# Patient Record
Sex: Female | Born: 1935 | Race: White | Hispanic: No | Smoking: Never smoker
Health system: Southern US, Community
[De-identification: ages and names within clinical notes are randomized; demographics above are authoritative.]

## PROBLEM LIST (undated history)

## (undated) DIAGNOSIS — N3281 Overactive bladder: Secondary | ICD-10-CM

## (undated) DIAGNOSIS — I639 Cerebral infarction, unspecified: Secondary | ICD-10-CM

## (undated) DIAGNOSIS — G20C Parkinsonism, unspecified: Secondary | ICD-10-CM

## (undated) DIAGNOSIS — M1990 Unspecified osteoarthritis, unspecified site: Secondary | ICD-10-CM

## (undated) DIAGNOSIS — N6019 Diffuse cystic mastopathy of unspecified breast: Secondary | ICD-10-CM

## (undated) DIAGNOSIS — I319 Disease of pericardium, unspecified: Secondary | ICD-10-CM

## (undated) DIAGNOSIS — J302 Other seasonal allergic rhinitis: Secondary | ICD-10-CM

## (undated) DIAGNOSIS — G20 Parkinson's disease: Secondary | ICD-10-CM

## (undated) DIAGNOSIS — I712 Thoracic aortic aneurysm, without rupture, unspecified: Secondary | ICD-10-CM

## (undated) DIAGNOSIS — I6789 Other cerebrovascular disease: Secondary | ICD-10-CM

## (undated) DIAGNOSIS — I4891 Unspecified atrial fibrillation: Secondary | ICD-10-CM

## (undated) DIAGNOSIS — IMO0002 Reserved for concepts with insufficient information to code with codable children: Secondary | ICD-10-CM

## (undated) DIAGNOSIS — R569 Unspecified convulsions: Secondary | ICD-10-CM

## (undated) DIAGNOSIS — C801 Malignant (primary) neoplasm, unspecified: Secondary | ICD-10-CM

## (undated) DIAGNOSIS — I6350 Cerebral infarction due to unspecified occlusion or stenosis of unspecified cerebral artery: Secondary | ICD-10-CM

## (undated) DIAGNOSIS — E785 Hyperlipidemia, unspecified: Secondary | ICD-10-CM

## (undated) DIAGNOSIS — I951 Orthostatic hypotension: Secondary | ICD-10-CM

## (undated) DIAGNOSIS — I442 Atrioventricular block, complete: Secondary | ICD-10-CM

## (undated) DIAGNOSIS — D49519 Neoplasm of unspecified behavior of unspecified kidney: Secondary | ICD-10-CM

## (undated) DIAGNOSIS — I10 Essential (primary) hypertension: Secondary | ICD-10-CM

## (undated) DIAGNOSIS — K219 Gastro-esophageal reflux disease without esophagitis: Secondary | ICD-10-CM

## (undated) HISTORY — PX: KNEE ARTHROSCOPY: SHX127

## (undated) HISTORY — DX: Other seasonal allergic rhinitis: J30.2

## (undated) HISTORY — DX: Hyperlipidemia, unspecified: E78.5

## (undated) HISTORY — DX: Atrioventricular block, complete: I44.2

## (undated) HISTORY — DX: Thoracic aortic aneurysm, without rupture: I71.2

## (undated) HISTORY — DX: Unspecified atrial fibrillation: I48.91

## (undated) HISTORY — DX: Diffuse cystic mastopathy of unspecified breast: N60.19

## (undated) HISTORY — DX: Neoplasm of unspecified behavior of unspecified kidney: D49.519

## (undated) HISTORY — PX: EYE SURGERY: SHX253

## (undated) HISTORY — DX: Gastro-esophageal reflux disease without esophagitis: K21.9

## (undated) HISTORY — PX: CHOLECYSTECTOMY: SHX55

## (undated) HISTORY — PX: APPENDECTOMY: SHX54

## (undated) HISTORY — PX: VESICOVAGINAL FISTULA CLOSURE W/ TAH: SUR271

## (undated) HISTORY — PX: PACEMAKER INSERTION: SHX728

## (undated) HISTORY — PX: ABLATION: SHX5711

## (undated) HISTORY — DX: Essential (primary) hypertension: I10

## (undated) HISTORY — DX: Thoracic aortic aneurysm, without rupture, unspecified: I71.20

## (undated) HISTORY — PX: CATARACT EXTRACTION: SUR2

## (undated) HISTORY — PX: FLEXIBLE BRONCHOSCOPY W/ UPPER ENDOSCOPY: SHX1648

## (undated) HISTORY — DX: Overactive bladder: N32.81

---

## 1973-02-01 HISTORY — PX: ABDOMINAL HYSTERECTOMY: SHX81

## 2005-06-22 ENCOUNTER — Ambulatory Visit (HOSPITAL_COMMUNITY): Admission: RE | Admit: 2005-06-22 | Discharge: 2005-06-22 | Payer: Self-pay | Admitting: Gastroenterology

## 2005-07-20 ENCOUNTER — Ambulatory Visit (HOSPITAL_COMMUNITY): Admission: RE | Admit: 2005-07-20 | Discharge: 2005-07-20 | Payer: Self-pay | Admitting: Gastroenterology

## 2006-01-28 ENCOUNTER — Ambulatory Visit (HOSPITAL_COMMUNITY): Admission: RE | Admit: 2006-01-28 | Discharge: 2006-01-28 | Payer: Self-pay | Admitting: Interventional Cardiology

## 2007-01-02 HISTORY — PX: BI-VENTRICULAR PACEMAKER UPGRADE: SHX5752

## 2007-01-24 ENCOUNTER — Inpatient Hospital Stay (HOSPITAL_COMMUNITY): Admission: RE | Admit: 2007-01-24 | Discharge: 2007-01-25 | Payer: Self-pay | Admitting: Cardiology

## 2007-05-18 ENCOUNTER — Ambulatory Visit: Payer: Self-pay | Admitting: Internal Medicine

## 2007-05-18 DIAGNOSIS — J328 Other chronic sinusitis: Secondary | ICD-10-CM | POA: Insufficient documentation

## 2007-05-18 DIAGNOSIS — R059 Cough, unspecified: Secondary | ICD-10-CM | POA: Insufficient documentation

## 2007-05-18 DIAGNOSIS — R05 Cough: Secondary | ICD-10-CM

## 2007-05-29 ENCOUNTER — Telehealth (INDEPENDENT_AMBULATORY_CARE_PROVIDER_SITE_OTHER): Payer: Self-pay | Admitting: *Deleted

## 2007-06-20 ENCOUNTER — Ambulatory Visit: Payer: Self-pay | Admitting: Internal Medicine

## 2007-06-20 LAB — CONVERTED CEMR LAB
Basophils Absolute: 0 10*3/uL (ref 0.0–0.1)
Basophils Relative: 0.7 % (ref 0.0–1.0)
Eosinophils Absolute: 0.4 10*3/uL (ref 0.0–0.7)
Eosinophils Relative: 6.6 % — ABNORMAL HIGH (ref 0.0–5.0)
HCT: 38.9 % (ref 36.0–46.0)
Hemoglobin: 13.3 g/dL (ref 12.0–15.0)
IgE (Immunoglobulin E), Serum: 5.7 intl units/mL (ref 0.0–180.0)
Lymphocytes Relative: 25.8 % (ref 12.0–46.0)
MCHC: 34.3 g/dL (ref 30.0–36.0)
MCV: 88.7 fL (ref 78.0–100.0)
Monocytes Absolute: 0.6 10*3/uL (ref 0.1–1.0)
Monocytes Relative: 10.1 % (ref 3.0–12.0)
Neutro Abs: 3.7 10*3/uL (ref 1.4–7.7)
Neutrophils Relative %: 56.8 % (ref 43.0–77.0)
Platelets: 157 10*3/uL (ref 150–400)
RBC: 4.39 M/uL (ref 3.87–5.11)
RDW: 12.9 % (ref 11.5–14.6)
WBC: 6.4 10*3/uL (ref 4.5–10.5)

## 2007-07-02 DIAGNOSIS — K219 Gastro-esophageal reflux disease without esophagitis: Secondary | ICD-10-CM | POA: Insufficient documentation

## 2007-07-05 ENCOUNTER — Ambulatory Visit: Payer: Self-pay | Admitting: Internal Medicine

## 2007-11-07 ENCOUNTER — Ambulatory Visit: Payer: Self-pay | Admitting: Internal Medicine

## 2008-01-08 ENCOUNTER — Ambulatory Visit: Payer: Self-pay | Admitting: Internal Medicine

## 2008-01-17 ENCOUNTER — Ambulatory Visit: Payer: Self-pay | Admitting: Internal Medicine

## 2008-01-18 ENCOUNTER — Ambulatory Visit: Payer: Self-pay | Admitting: Internal Medicine

## 2008-01-22 ENCOUNTER — Ambulatory Visit: Payer: Self-pay | Admitting: Internal Medicine

## 2008-01-25 ENCOUNTER — Ambulatory Visit: Payer: Self-pay | Admitting: Internal Medicine

## 2008-01-29 ENCOUNTER — Ambulatory Visit: Payer: Self-pay | Admitting: Internal Medicine

## 2008-02-01 ENCOUNTER — Ambulatory Visit: Payer: Self-pay | Admitting: Internal Medicine

## 2008-02-01 ENCOUNTER — Inpatient Hospital Stay (HOSPITAL_COMMUNITY): Admission: EM | Admit: 2008-02-01 | Discharge: 2008-02-07 | Payer: Self-pay | Admitting: Emergency Medicine

## 2008-02-02 ENCOUNTER — Encounter (INDEPENDENT_AMBULATORY_CARE_PROVIDER_SITE_OTHER): Payer: Self-pay | Admitting: Neurology

## 2008-02-05 ENCOUNTER — Encounter (INDEPENDENT_AMBULATORY_CARE_PROVIDER_SITE_OTHER): Payer: Self-pay | Admitting: Neurology

## 2008-02-06 ENCOUNTER — Encounter (INDEPENDENT_AMBULATORY_CARE_PROVIDER_SITE_OTHER): Payer: Self-pay | Admitting: Neurology

## 2008-02-07 ENCOUNTER — Encounter (INDEPENDENT_AMBULATORY_CARE_PROVIDER_SITE_OTHER): Payer: Self-pay | Admitting: Neurology

## 2008-02-07 ENCOUNTER — Ambulatory Visit: Payer: Self-pay | Admitting: Vascular Surgery

## 2008-02-12 ENCOUNTER — Ambulatory Visit: Payer: Self-pay | Admitting: Internal Medicine

## 2008-02-15 ENCOUNTER — Ambulatory Visit: Payer: Self-pay | Admitting: Internal Medicine

## 2008-02-19 ENCOUNTER — Ambulatory Visit: Payer: Self-pay | Admitting: Internal Medicine

## 2008-02-23 ENCOUNTER — Ambulatory Visit: Payer: Self-pay | Admitting: Internal Medicine

## 2008-02-27 ENCOUNTER — Ambulatory Visit: Payer: Self-pay | Admitting: Internal Medicine

## 2008-03-01 ENCOUNTER — Ambulatory Visit: Payer: Self-pay | Admitting: Internal Medicine

## 2008-03-04 ENCOUNTER — Ambulatory Visit: Payer: Self-pay | Admitting: Internal Medicine

## 2008-03-07 ENCOUNTER — Ambulatory Visit: Payer: Self-pay | Admitting: Internal Medicine

## 2008-03-11 ENCOUNTER — Ambulatory Visit: Payer: Self-pay | Admitting: Internal Medicine

## 2008-03-12 ENCOUNTER — Ambulatory Visit: Payer: Self-pay | Admitting: Internal Medicine

## 2008-03-14 ENCOUNTER — Ambulatory Visit: Payer: Self-pay | Admitting: Internal Medicine

## 2008-03-18 ENCOUNTER — Ambulatory Visit: Payer: Self-pay | Admitting: Internal Medicine

## 2008-03-21 ENCOUNTER — Ambulatory Visit: Payer: Self-pay | Admitting: Internal Medicine

## 2008-03-25 ENCOUNTER — Ambulatory Visit: Payer: Self-pay | Admitting: Internal Medicine

## 2008-03-28 ENCOUNTER — Ambulatory Visit: Payer: Self-pay | Admitting: Internal Medicine

## 2008-04-01 ENCOUNTER — Ambulatory Visit: Payer: Self-pay | Admitting: Internal Medicine

## 2008-04-05 ENCOUNTER — Ambulatory Visit: Payer: Self-pay | Admitting: Internal Medicine

## 2008-04-08 ENCOUNTER — Ambulatory Visit: Payer: Self-pay | Admitting: Internal Medicine

## 2008-04-10 ENCOUNTER — Ambulatory Visit: Payer: Self-pay | Admitting: Internal Medicine

## 2008-04-11 ENCOUNTER — Ambulatory Visit: Payer: Self-pay | Admitting: Internal Medicine

## 2008-04-15 ENCOUNTER — Ambulatory Visit: Payer: Self-pay | Admitting: Internal Medicine

## 2008-04-18 ENCOUNTER — Ambulatory Visit: Payer: Self-pay | Admitting: Internal Medicine

## 2008-04-22 ENCOUNTER — Ambulatory Visit: Payer: Self-pay | Admitting: Internal Medicine

## 2008-04-24 ENCOUNTER — Encounter: Admission: RE | Admit: 2008-04-24 | Discharge: 2008-06-19 | Payer: Self-pay | Admitting: Neurology

## 2008-04-26 ENCOUNTER — Ambulatory Visit: Payer: Self-pay | Admitting: Internal Medicine

## 2008-04-29 ENCOUNTER — Ambulatory Visit: Payer: Self-pay | Admitting: Internal Medicine

## 2008-05-07 ENCOUNTER — Ambulatory Visit: Payer: Self-pay | Admitting: Internal Medicine

## 2008-05-07 DIAGNOSIS — I6350 Cerebral infarction due to unspecified occlusion or stenosis of unspecified cerebral artery: Secondary | ICD-10-CM

## 2008-05-07 HISTORY — DX: Cerebral infarction due to unspecified occlusion or stenosis of unspecified cerebral artery: I63.50

## 2008-05-10 ENCOUNTER — Telehealth (INDEPENDENT_AMBULATORY_CARE_PROVIDER_SITE_OTHER): Payer: Self-pay | Admitting: *Deleted

## 2008-05-13 ENCOUNTER — Encounter: Payer: Self-pay | Admitting: Internal Medicine

## 2008-05-13 ENCOUNTER — Ambulatory Visit: Payer: Self-pay | Admitting: Internal Medicine

## 2008-05-21 ENCOUNTER — Ambulatory Visit: Payer: Self-pay | Admitting: Internal Medicine

## 2008-05-27 ENCOUNTER — Ambulatory Visit: Payer: Self-pay | Admitting: Internal Medicine

## 2008-06-06 ENCOUNTER — Ambulatory Visit: Payer: Self-pay | Admitting: Internal Medicine

## 2008-06-13 ENCOUNTER — Ambulatory Visit: Payer: Self-pay | Admitting: Internal Medicine

## 2008-06-20 ENCOUNTER — Ambulatory Visit: Payer: Self-pay | Admitting: Internal Medicine

## 2008-06-27 ENCOUNTER — Ambulatory Visit: Payer: Self-pay | Admitting: Internal Medicine

## 2008-07-04 ENCOUNTER — Ambulatory Visit: Payer: Self-pay | Admitting: Internal Medicine

## 2008-07-11 ENCOUNTER — Ambulatory Visit: Payer: Self-pay | Admitting: Internal Medicine

## 2008-07-17 ENCOUNTER — Ambulatory Visit: Payer: Self-pay | Admitting: Internal Medicine

## 2008-07-24 ENCOUNTER — Ambulatory Visit: Payer: Self-pay | Admitting: Internal Medicine

## 2008-07-31 ENCOUNTER — Encounter: Payer: Self-pay | Admitting: Internal Medicine

## 2008-08-02 ENCOUNTER — Ambulatory Visit: Payer: Self-pay | Admitting: Internal Medicine

## 2008-08-08 ENCOUNTER — Ambulatory Visit: Payer: Self-pay | Admitting: Internal Medicine

## 2008-08-15 ENCOUNTER — Ambulatory Visit: Payer: Self-pay | Admitting: Internal Medicine

## 2008-08-21 ENCOUNTER — Ambulatory Visit: Payer: Self-pay | Admitting: Internal Medicine

## 2008-08-29 ENCOUNTER — Ambulatory Visit: Payer: Self-pay | Admitting: Internal Medicine

## 2008-09-05 ENCOUNTER — Ambulatory Visit: Payer: Self-pay | Admitting: Internal Medicine

## 2008-09-12 ENCOUNTER — Ambulatory Visit: Payer: Self-pay | Admitting: Internal Medicine

## 2008-09-20 ENCOUNTER — Ambulatory Visit: Payer: Self-pay | Admitting: Internal Medicine

## 2008-09-26 ENCOUNTER — Ambulatory Visit: Payer: Self-pay | Admitting: Internal Medicine

## 2008-09-27 ENCOUNTER — Telehealth (INDEPENDENT_AMBULATORY_CARE_PROVIDER_SITE_OTHER): Payer: Self-pay | Admitting: *Deleted

## 2008-10-01 ENCOUNTER — Encounter: Payer: Self-pay | Admitting: Internal Medicine

## 2008-10-02 ENCOUNTER — Ambulatory Visit: Payer: Self-pay | Admitting: Internal Medicine

## 2008-10-03 ENCOUNTER — Ambulatory Visit: Payer: Self-pay | Admitting: Internal Medicine

## 2008-10-11 ENCOUNTER — Ambulatory Visit: Payer: Self-pay | Admitting: Internal Medicine

## 2008-10-17 ENCOUNTER — Ambulatory Visit: Payer: Self-pay | Admitting: Internal Medicine

## 2008-10-24 ENCOUNTER — Encounter (INDEPENDENT_AMBULATORY_CARE_PROVIDER_SITE_OTHER): Payer: Self-pay | Admitting: *Deleted

## 2008-10-24 ENCOUNTER — Ambulatory Visit: Payer: Self-pay | Admitting: Internal Medicine

## 2008-10-31 ENCOUNTER — Ambulatory Visit: Payer: Self-pay | Admitting: Internal Medicine

## 2008-11-07 ENCOUNTER — Ambulatory Visit: Payer: Self-pay | Admitting: Internal Medicine

## 2008-11-07 DIAGNOSIS — J309 Allergic rhinitis, unspecified: Secondary | ICD-10-CM | POA: Insufficient documentation

## 2008-11-14 ENCOUNTER — Ambulatory Visit: Payer: Self-pay | Admitting: Internal Medicine

## 2008-11-21 ENCOUNTER — Ambulatory Visit: Payer: Self-pay | Admitting: Internal Medicine

## 2008-11-28 ENCOUNTER — Ambulatory Visit: Payer: Self-pay | Admitting: Internal Medicine

## 2008-12-06 ENCOUNTER — Ambulatory Visit: Payer: Self-pay | Admitting: Internal Medicine

## 2008-12-12 ENCOUNTER — Ambulatory Visit: Payer: Self-pay | Admitting: Internal Medicine

## 2008-12-18 ENCOUNTER — Ambulatory Visit: Payer: Self-pay | Admitting: Internal Medicine

## 2008-12-27 ENCOUNTER — Ambulatory Visit: Payer: Self-pay | Admitting: Internal Medicine

## 2009-01-02 ENCOUNTER — Ambulatory Visit: Payer: Self-pay | Admitting: Internal Medicine

## 2009-01-02 ENCOUNTER — Encounter: Admission: RE | Admit: 2009-01-02 | Discharge: 2009-01-30 | Payer: Self-pay | Admitting: Neurology

## 2009-01-09 ENCOUNTER — Ambulatory Visit: Payer: Self-pay | Admitting: Internal Medicine

## 2009-01-15 ENCOUNTER — Ambulatory Visit: Payer: Self-pay | Admitting: Internal Medicine

## 2009-01-21 ENCOUNTER — Ambulatory Visit: Payer: Self-pay | Admitting: Internal Medicine

## 2009-01-30 ENCOUNTER — Ambulatory Visit: Payer: Self-pay | Admitting: Internal Medicine

## 2009-01-30 ENCOUNTER — Encounter: Admission: RE | Admit: 2009-01-30 | Discharge: 2009-03-06 | Payer: Self-pay | Admitting: Neurology

## 2009-02-05 ENCOUNTER — Ambulatory Visit: Payer: Self-pay | Admitting: Internal Medicine

## 2009-02-06 ENCOUNTER — Ambulatory Visit: Payer: Self-pay | Admitting: Internal Medicine

## 2009-02-12 ENCOUNTER — Ambulatory Visit: Payer: Self-pay | Admitting: Internal Medicine

## 2009-02-20 ENCOUNTER — Ambulatory Visit: Payer: Self-pay | Admitting: Internal Medicine

## 2009-02-26 ENCOUNTER — Ambulatory Visit: Payer: Self-pay | Admitting: Internal Medicine

## 2009-03-04 ENCOUNTER — Ambulatory Visit: Payer: Self-pay | Admitting: Internal Medicine

## 2009-03-12 ENCOUNTER — Ambulatory Visit: Payer: Self-pay | Admitting: Internal Medicine

## 2009-03-21 ENCOUNTER — Ambulatory Visit: Payer: Self-pay | Admitting: Internal Medicine

## 2009-03-28 ENCOUNTER — Ambulatory Visit: Payer: Self-pay | Admitting: Internal Medicine

## 2009-04-04 ENCOUNTER — Ambulatory Visit: Payer: Self-pay | Admitting: Internal Medicine

## 2009-04-11 ENCOUNTER — Ambulatory Visit: Payer: Self-pay | Admitting: Internal Medicine

## 2009-04-16 ENCOUNTER — Ambulatory Visit: Payer: Self-pay | Admitting: Internal Medicine

## 2009-04-25 ENCOUNTER — Ambulatory Visit: Payer: Self-pay | Admitting: Internal Medicine

## 2009-05-01 ENCOUNTER — Ambulatory Visit: Payer: Self-pay | Admitting: Internal Medicine

## 2009-05-08 ENCOUNTER — Ambulatory Visit: Payer: Self-pay | Admitting: Internal Medicine

## 2009-05-16 ENCOUNTER — Ambulatory Visit: Payer: Self-pay | Admitting: Internal Medicine

## 2009-05-21 ENCOUNTER — Ambulatory Visit: Payer: Self-pay | Admitting: Internal Medicine

## 2009-05-30 ENCOUNTER — Ambulatory Visit: Payer: Self-pay | Admitting: Internal Medicine

## 2009-06-02 ENCOUNTER — Ambulatory Visit: Payer: Self-pay | Admitting: Internal Medicine

## 2009-06-05 ENCOUNTER — Encounter: Admission: RE | Admit: 2009-06-05 | Discharge: 2009-06-05 | Payer: Self-pay | Admitting: Interventional Cardiology

## 2009-06-06 ENCOUNTER — Ambulatory Visit: Payer: Self-pay | Admitting: Internal Medicine

## 2009-06-13 ENCOUNTER — Ambulatory Visit: Payer: Self-pay | Admitting: Internal Medicine

## 2009-06-20 ENCOUNTER — Ambulatory Visit: Payer: Self-pay | Admitting: Internal Medicine

## 2009-06-26 ENCOUNTER — Ambulatory Visit: Payer: Self-pay | Admitting: Internal Medicine

## 2009-07-02 ENCOUNTER — Ambulatory Visit: Payer: Self-pay | Admitting: Internal Medicine

## 2009-07-10 ENCOUNTER — Ambulatory Visit: Payer: Self-pay | Admitting: Internal Medicine

## 2009-07-18 ENCOUNTER — Ambulatory Visit: Payer: Self-pay | Admitting: Internal Medicine

## 2009-07-25 ENCOUNTER — Ambulatory Visit: Payer: Self-pay | Admitting: Internal Medicine

## 2009-07-30 ENCOUNTER — Ambulatory Visit: Payer: Self-pay | Admitting: Internal Medicine

## 2009-08-11 ENCOUNTER — Ambulatory Visit: Payer: Self-pay | Admitting: Internal Medicine

## 2009-08-19 ENCOUNTER — Ambulatory Visit: Payer: Self-pay | Admitting: Internal Medicine

## 2009-08-26 ENCOUNTER — Ambulatory Visit: Payer: Self-pay | Admitting: Internal Medicine

## 2009-09-02 ENCOUNTER — Ambulatory Visit: Payer: Self-pay | Admitting: Internal Medicine

## 2009-09-09 ENCOUNTER — Ambulatory Visit: Payer: Self-pay | Admitting: Internal Medicine

## 2009-09-16 ENCOUNTER — Ambulatory Visit: Payer: Self-pay | Admitting: Internal Medicine

## 2009-09-23 ENCOUNTER — Ambulatory Visit: Payer: Self-pay | Admitting: Internal Medicine

## 2009-10-01 ENCOUNTER — Ambulatory Visit: Payer: Self-pay | Admitting: Internal Medicine

## 2009-10-02 ENCOUNTER — Ambulatory Visit: Payer: Self-pay | Admitting: Internal Medicine

## 2009-10-08 ENCOUNTER — Ambulatory Visit: Payer: Self-pay | Admitting: Internal Medicine

## 2009-10-10 ENCOUNTER — Encounter: Admission: RE | Admit: 2009-10-10 | Discharge: 2009-10-10 | Payer: Self-pay | Admitting: Neurology

## 2009-10-14 ENCOUNTER — Ambulatory Visit: Payer: Self-pay | Admitting: Internal Medicine

## 2009-10-22 ENCOUNTER — Ambulatory Visit: Payer: Self-pay | Admitting: Internal Medicine

## 2009-10-23 ENCOUNTER — Ambulatory Visit: Payer: Self-pay | Admitting: Internal Medicine

## 2009-10-31 ENCOUNTER — Ambulatory Visit: Payer: Self-pay | Admitting: Internal Medicine

## 2009-11-07 ENCOUNTER — Ambulatory Visit: Payer: Self-pay | Admitting: Internal Medicine

## 2009-11-13 ENCOUNTER — Ambulatory Visit: Payer: Self-pay | Admitting: Internal Medicine

## 2009-11-19 ENCOUNTER — Ambulatory Visit: Payer: Self-pay | Admitting: Internal Medicine

## 2009-11-27 ENCOUNTER — Ambulatory Visit: Payer: Self-pay | Admitting: Internal Medicine

## 2009-12-04 ENCOUNTER — Ambulatory Visit: Payer: Self-pay | Admitting: Internal Medicine

## 2009-12-12 ENCOUNTER — Ambulatory Visit: Payer: Self-pay | Admitting: Internal Medicine

## 2009-12-13 ENCOUNTER — Encounter: Payer: Self-pay | Admitting: Internal Medicine

## 2009-12-18 ENCOUNTER — Ambulatory Visit: Payer: Self-pay | Admitting: Internal Medicine

## 2009-12-26 ENCOUNTER — Ambulatory Visit: Payer: Self-pay | Admitting: Internal Medicine

## 2010-01-01 ENCOUNTER — Ambulatory Visit: Payer: Self-pay | Admitting: Internal Medicine

## 2010-01-08 ENCOUNTER — Ambulatory Visit: Payer: Self-pay | Admitting: Internal Medicine

## 2010-01-16 ENCOUNTER — Ambulatory Visit: Payer: Self-pay | Admitting: Internal Medicine

## 2010-01-30 ENCOUNTER — Ambulatory Visit: Payer: Self-pay | Admitting: Internal Medicine

## 2010-02-03 ENCOUNTER — Ambulatory Visit: Payer: Self-pay | Admitting: Internal Medicine

## 2010-02-06 ENCOUNTER — Ambulatory Visit
Admission: RE | Admit: 2010-02-06 | Discharge: 2010-02-06 | Payer: Self-pay | Source: Home / Self Care | Attending: Internal Medicine | Admitting: Internal Medicine

## 2010-02-11 ENCOUNTER — Telehealth (INDEPENDENT_AMBULATORY_CARE_PROVIDER_SITE_OTHER): Payer: Self-pay | Admitting: *Deleted

## 2010-02-16 ENCOUNTER — Ambulatory Visit: Payer: Self-pay | Admitting: Internal Medicine

## 2010-02-18 ENCOUNTER — Ambulatory Visit: Payer: Self-pay | Admitting: Internal Medicine

## 2010-02-20 ENCOUNTER — Ambulatory Visit: Payer: Self-pay | Admitting: Internal Medicine

## 2010-02-21 ENCOUNTER — Encounter: Payer: Self-pay | Admitting: Neurology

## 2010-03-01 ENCOUNTER — Ambulatory Visit: Payer: Self-pay | Admitting: Internal Medicine

## 2010-03-03 NOTE — Progress Notes (Signed)
Summary: Adv vaccine 1:10  Phone Note Other Incoming   Caller: T.Scott Details for Reason: FYI Summary of Call: Mrs.Haltiwanger has finished her first 1:50. I didn't know if you wanted her to go to 1:10 or not. Please let me know when you get a chance;she is ready for more vac.. Initial call taken by: Dimas Millin,  September 27, 2008 9:52 AM  Follow-up for Phone Call        OK to advance to 1:10 per protocol. Follow-up by: Waymon Budge MD,  October 01, 2008 9:17 PM    New/Updated Medications: * ALLERGY VACCINE !:10 GH

## 2010-03-03 NOTE — Assessment & Plan Note (Signed)
Summary: rov///kp   PCP:  [name]  Chief Complaint:  4 month followup.  Pt states that her allergies did improve after last visit, but in the past month her cough has returned.  Cough is prod with yellow sptutum, and but sometimes she can't produce any sputum.  Marland Kitchen  History of Present Illness: From prior 07/05/07- This 77 year old woman returns with her daughter today for allergy skin testing as planned.  She has been off of antihistamines and notices increasing sneeze, coughing until she retches, but no wheezing.  Her eyes are not itching.  There is some postnasal drip, and she complains of bilateral maxillary pressure discomfort.  Skipping, propranolol for a few days had no effect on cough, but her heart rate sped up and she was told to get back on it.  She tried astepro, but isn't sure it helped.  Tussionex did help.  Lab results were reviewed.  Eosinophil percentage was a little elevated at 6.6%. IgE was low at 5.7. Skin tests:  appropriate controls.  Mild positive intradermal responses to pollens, dust mite, and some molds. 11/07/07-- OK through summer. Now "allergy season"= head cong, yellow discharge. Chest comfortable. No fever. Can tell no difference Astepro vs Flonase???. Aware of reflux.         Current Allergies: No known allergies   Past Medical History:    Reviewed history from 06/20/2007 and no changes required:       sick sinus syndrome with pacemaker and ablation       glaucoma       seasonal allergic rhinitis- Pos Skin Tests 07/05/07.       G E R D     Review of Systems      See HPI   Vital Signs:  Patient Profile:   77 Years Old Female Weight:      166.50 pounds O2 Sat:      97 % O2 treatment:    Room Air Pulse rate:   81 / minute BP sitting:   94 / 62  (left arm)  Vitals Entered By: Vernie Murders (November 07, 2007 2:12 PM)                 Physical Exam  General: A/Ox3; pleasant and cooperative, NAD, SKIN: no rash, lesions NODES: no  lymphadenopathy HEENT: [place]/AT, EOM- WNL, Conjuctivae- clear, PERRLA, TM-WNL, Nose- clear, Throat- red without drainage or exudate. NECK: Supple w/ fair ROM, JVD- none, normal carotid impulses w/o bruits Thyroid- normal to palpation CHEST: Clear to P&A HEART: RRR, no m/g/r heard ABDOMEN: Soft and nl; nml bowel sounds; no organomegaly or masses noted JSE:GBTD, nl pulses, no edema  NEURO: Grossly intact to observation         Problem # 1:  RHINOSINUSITIS, CHRONIC (ICD-473.8) try sample Nasacort instead of astepro or flonase. The following medications were removed from the medication list:    Flonase 50 Mcg/act Susp (Fluticasone propionate) ..... Use as directed    Mucinex Dm 30-600 Mg Tb12 (Dextromethorphan-guaifenesin) ..... Use as directed as needed  Her updated medication list for this problem includes:    Zyrtec Allergy 10 Mg Tabs (Cetirizine hcl) .Marland Kitchen... Take 1 by mouth once daily    Tussionex Pennkinetic Er 8-10 Mg/59ml Lqcr (Chlorpheniramine-hydrocodone) .Marland Kitchen... 5cc every 12 hours as needed    Astepro 137 Mcg/spray Soln (Azelastine hcl) .Marland Kitchen... Take 1- 2 sprays in each nostril two times a day as needed   Problem # 2:  G E R D (ICD-530.81) Emphasized  reflux precautions. Incr Prilosec to two times a day   Medications Added to Medication List This Visit: 1)  Prilosec Otc 20 Mg Tbec (Omeprazole magnesium) .Marland Kitchen.. 1 once daily 2)  Meclizine Hcl 25 Mg Tabs (Meclizine hcl) .Marland Kitchen.. 1 three times a day as needed   Patient Instructions: 1)  Please schedule a follow-up appointment in 2 month. 2)  Instead of Astepro-Try sample Nasacort AQ- 1-2 puffs each nostril daily. If the sample helps, we can get you started back with a maintenance steroid inhaler. 3)  Try increasing your prilosec to 1 tab twice daily for GERD/ reflux.   ]

## 2010-03-03 NOTE — Assessment & Plan Note (Signed)
Summary: 1 MONTH RETURN/MH   Visit Type:  Follow-up PCP:  [name]  Chief Complaint:  follow up visit.  History of Present Illness: Current Problems:  RHINOSINUSITIS, CHRONIC (ICD-473.8) COUGH (ICD-786.2)  Debra Lynn returns with her daughter for follow-up for rhinosinusitis and cough.  The 3 of [place] again reviewed her history.  She tried skipping propranolol for 4 days.  Her heart rate went up and cardiology put her back on this medicine.  During that time she noticed no effect on her cough.  She also tried skipping Tussionex, but found she needs that.  She has had spring allergy symptoms with watery eyes, sneezing, and sinus pressure.  She did not get enough symptomatic relief from as to throw or Qvar inhalers.  Occasionally, she is aware of some reflux, but not much.       Current Allergies (reviewed today): No known allergies   Past Medical History:    Reviewed history from 05/18/2007 and no changes required:       sick sinus syndrome with pacemaker and ablation       glaucoma       seasonal allergic rhinitis       G E R D   Social History:    Reviewed history from 05/18/2007 and no changes required:       Patient never smoked.     Review of Systems      See HPI   Vital Signs:  Patient Profile:   77 Years Old Female Weight:      162.13 pounds O2 Sat:      96 % O2 treatment:    Room Air Pulse rate:   761 / minute BP sitting:   128 / 78  (left arm) Cuff size:   regular  Vitals Entered By: Debra Lynn CMA (Jun 20, 2007 10:08 AM)             Comments Medications reviewed with patient Debra Lynn CMA  Jun 20, 2007 10:09 AM      Physical Exam  General:     normal appearance and healthy appearing.   head bobbing tremor Eyes:     PERRLA/EOM intact; conjunctiva and sclera clear Ears:     TMs intact and clear with normal canals Nose:     no deformity, discharge, inflammation, or lesions Mouth:     no deformity or lesions pharynx is not  red Neck:     no JVD.   Lungs:     clear bilaterally to auscultation and percussion Heart:     regular rate and rhythm, S1, S2 without murmurs, rubs, gallops, or clicks     Problem # 1:  COUGH (ICD-786.2) the most obvious explanation for now, is that her cough as a result of allergy with postnasal drainage, and perhaps, mild cough  equivalent, asthma, even though I don't see definite evidence of postnasal drip.  I will keep the question about propranolol in mind although being off it for 4 days diid not make noticeable difference in symptoms."RAST" (Allergy Full Profile) IGE [phone]) "RAST" (Allergy Full Profile) IGE [phone]) "RAST" (Allergy Full Profile) IGE ([id number]) "RAST" (Allergy Full Profile) IGE ([id number]) "RAST" (Allergy Full Profile) IGE ([id number]) "RAST" (Allergy Full Profile) IGE ([id number])   Problem # 2:  RHINOSINUSITIS, CHRONIC (ICD-473.8) Assessment: Unchanged  Her updated medication list for this problem includes:    Flonase 50 Mcg/act Susp (Fluticasone propionate) ..... Use as directed    Zyrtec Allergy 10 Mg Tabs (Cetirizine hcl) .Marland Kitchen... Take  1 by mouth once daily    Mucinex Dm 30-600 Mg Tb12 (Dextromethorphan-guaifenesin) ..... Use as directed as needed    Tussionex Pennkinetic Er 8-10 Mg/43ml Lqcr (Chlorpheniramine-hydrocodone) .Marland Kitchen... 5cc every 12 hours as needed    Astepro 137 Mcg/spray Soln (Azelastine hcl) .Marland Kitchen... Take 1- 2 sprays in each nostril two times a day as needed "RAST" (Allergy Full Profile) IGE [phone]) "RAST" (Allergy Full Profile) IGE ([id number]) "RAST" (Allergy Full Profile) IGE ([id number]) "RAST" (Allergy Full Profile) IGE ([id number]) "RAST" (Allergy Full Profile) IGE ([id number]) "RAST" (Allergy Full Profile) IGE ([id number])    Patient Instructions: 1)  Make appt to return for allergy skin testing- need to be off of antihistamines/ sinus pills, Tussionex and Astepro nasal spray for 3 days before you  return. 2)  Labs today   ]

## 2010-03-03 NOTE — Assessment & Plan Note (Signed)
Summary: FLU SHOT/[facility]  Nurse Visit   Allergies: No Known Drug Allergies  Orders Added: 1)  Flu Vaccine 39yrs + MEDICARE PATIENTS [Q2039] 2)  Administration Flu vaccine - MCR [G0008] Flu Vaccine Consent Questions     Do you have a history of severe allergic reactions to this vaccine? no    Any prior history of allergic reactions to egg and/or gelatin? no    Do you have a sensitivity to the preservative Thimersol? no    Do you have a past history of Guillan-Barre Syndrome? no    Do you currently have an acute febrile illness? no    Have you ever had a severe reaction to latex? no    Vaccine information given and explained to patient? yes    Are you currently pregnant? no    Lot Number:[id number]   Exp Date:08/01/2010   Site Given  Left Deltoid IMmedflu   Tammy Scott  October 27, 2009 2:14 PM

## 2010-03-03 NOTE — Assessment & Plan Note (Signed)
Summary: f/u 6 months///kp   Primary Kveon Casanas/Referring Jorgia Manthei:  [name]  CC:  follow up visit-breathing good.Marland Kitchen  History of Present Illness:  01/08/08-Rhinosinusitis cough  Daughter here Took doxycycline for eye infection, then Cipro for UTI.  Got seasonal flu vax, declines H1N1 vax.  Takes propranalol for tachycardia (Has pacemaker) and tremor. Off propranol briefly- no effect on cough seen but cardilogy wanted her bac k on it. Daughter finds allergy vaccine very effective for hersel fand asks mother try- discussed risk benefit. Discussed cost of tussionex.  05/07/08- Rhinosinusitis, cough                     Daughter here and participating. Hosp for CVA on 01/31/09- Speech, Right side. Much improved. Cough has returned some with Spring pollen. Little sputum and denies feeling pndrip or wheeze. Just finished vaccine buildup. We discussed allergy vaccine/ epinephrine in patient on propranalol.  Needs tusionex refill, but dtr says cough is not what it used to  be,  benzonatate never helped. Cough drops help some.Taking Zyrtec.  November 07, 2008 Rhinosinusitis, cough follow up visit-breathing good. Doing well recently. Cough has  been much better. She denies shortness of breath, sneezing, drainage. Eyes do itch- dry. Uses eye drops. She says allergy vaccine definitely helps, especially at dose increase. Had flu vax.        Current Medications (verified): 1)  Hyzaar 50-12.5 Mg  Tabs (Losartan Potassium-Hctz) .... Once Daily 2)  Norvasc 2.5 Mg  Tabs (Amlodipine Besylate) .... Once Daily 3)  Propranolol Hcl Cr 60 Mg  Cp24 (Propranolol Hcl) .... Take 2 By Mouth Once Daily 4)  Potassium Chloride 20 Meq  Pack (Potassium Chloride) .... Take 1 By Mouth Once Daily 5)  Calcium 600 + D 600-400 Mg-Unit  Tabs (Calcium Carbonate-Vitamin D) .... Take 2 By Mouth Once Daily 6)  Centrum Silver   Tabs (Multiple Vitamins-Minerals) .... Take 1 By Mouth Once Daily 7)  Pravachol 40 Mg  Tabs (Pravastatin  Sodium) .... Take 1 By Mouth Once Daily 8)  Zyrtec Allergy 10 Mg  Tabs (Cetirizine Hcl) .... Take 1 By Mouth Once Daily 9)  Tussionex Pennkinetic Er 8-10 Mg/29ml  Lqcr (Chlorpheniramine-Hydrocodone) .... 5cc Every 12 Hours As Needed 10)  Allergy Vaccine !:10 Gh 11)  Coumadin 5 Mg Tabs (Warfarin Sodium) .... Take 1/2 Tablet 6 Days A Week and 1/2 Tablet On Tuesdays. 12)  Tylenol Extra Strength 500 Mg Tabs (Acetaminophen) .... Tale As Directed On Bottle  Allergies (verified): No Known Drug Allergies  Past History:  Past Medical History: Last updated: 05/07/2008 sick sinus syndrome with pacemaker and ablation CVA- 2010 glaucoma seasonal allergic rhinitis- Pos Skin Tests 07/05/07. G E R D Cough  Past Surgical History: Last updated: 05/18/2007 Cholecystectomy hysterectomy ablation pacemaker with revision, most recently December of 2008 endoscopy and swallowing evaluation  Family History: Last updated: 07/05/2007 Family History Asthma- brother, daughter heart disease cancer mother died age 57 from heart failure father died age 53 from cancer 1 brother alive age 39--hx of lymphoma,DM,HBP  Social History: Last updated: 07/05/2007 Patient never smoked.  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    Vital Signs:  Patient profile:  77 year old female Height:      63 inches Weight:      160.13 pounds BMI:     28.47 O2 Sat:      95 % on Room air Pulse rate:   82 / minute BP sitting:   136 / 80  (right arm) Cuff size:   regular  Vitals Entered By: Reynaldo Minium CMA (November 07, 2008 2:36 PM)  O2 Flow:   Room air  Physical Exam  Additional Exam:  General: A/Ox3; pleasant and cooperative, NAD, SKIN: no rash, lesions NODES: no lymphadenopathy HEENT: [place]/AT, EOM- WNL, Conjuctivae- clear, PERRLA, TM-WNL, Nose- clear, Throat- red without drainage or exudate.Melampatti III NECK: Supple w/ fair ROM, JVD- none, normal carotid impulses w/o bruits Thyroid- CHEST: Clear to P&A HEART: RRR, no m/g/r heard ABDOMEN: RUE:AVWU, nl pulses, no edema  NEURO: Mild hesistandy in speech, but smiles and seems comfortable., head bobbing tremor.      Impression & Recommendations:  Problem # 1:  COUGH (ICD-786.2)  Much improved- seems to have  been partly related to postnasal drip. We can watch this for now.  Medications Added to Medication List This Visit: 1)  Coumadin 5 Mg Tabs (Warfarin sodium) .... Take 1/2 tablet 6 days a week and 1/2 tablet on tuesdays. 2)  Tylenol Extra Strength 500 Mg Tabs (Acetaminophen) .... Tale as directed on bottle  Other Orders: Est. Patient Level II (98119)  Patient Instructions: 1)  Please schedule return appointment in one year, earlier as needed. 2)  Continue allergy vaccine. Call as needed

## 2010-03-03 NOTE — Miscellaneous (Signed)
Summary: Injection Record / [facility]    Injection Record / [facility]    Imported By: Lennie Odor 07/10/2009 17:23:42  _____________________________________________________________________  External Attachment:    Type:   Image     Comment:   External Document

## 2010-03-03 NOTE — Assessment & Plan Note (Signed)
Summary: allergy problem/ mbw   Visit Type:  IME Initial PCP:  [name]  Chief Complaint:  consult.  History of Present Illness: Tthis 77 year old woman is brought by her daughter, a patient here, for evaluation of chronic sinusitis and allergies.  She complains of cough going back many years.  Changes of weather seem to be a trigger.  Antihistamines and Sudafed help.  She also gets puffy eyes with some blurring of vision with season change.  Her eye doctor told her this was allergy.  It's been worse in the last two to 3 years.  Sudafed did help but her cardiologist, Corliss Marcus, has asked her not to use it. She needs Tussionex to sleep because of the cough.  Many years ago.she was told she had asthma.  There has never been any pneumonia.  She notices postnasal drip.  She props up on two pillows to avoid reflux.  Shortness of breath with exertion, walking and climbing stairs, but not during sleep.  She gets an aching fpressure discomfort in her upper teeth.  Occasionally she will cough up scant white sputum.  She does not wheeze.  There is no chest pain.     Current Allergies (reviewed today): No known allergies   Past Medical History:    Reviewed history and no changes required:       sick sinus syndrome with pacemaker and ablation       glaucoma       seasonal allergic rhinitis       Genella Rife  Past Surgical History:    Reviewed history and no changes required:       Cholecystectomy       hysterectomy       ablation       pacemaker with revision, most recently December of 2008       endoscopy and swallowing evaluation   Family History:    Family History Asthma- brother, daughter    heart disease    cancer  Social History:    Reviewed history and no changes required:       Patient never smoked.    Risk Factors:  Tobacco use:  never   Review of Systems       exertional dyspnea, nonproductive cough, acid indigestion, toothache, sneezing, earache, joint  stiffness.   Vital Signs:  Patient Profile:   77 Years Old Female Weight:      167.50 pounds O2 Sat:      96 % O2 treatment:    Room Air Pulse rate:   88 / minute BP sitting:   138 / 80  (left arm) Cuff size:   regular  Vitals Entered By: Reynaldo Minium CMA (May 18, 2007 3:29 PM)             Comments Medications reviewed with patient  ..................................................................Marland KitchenReynaldo Minium CMA  May 18, 2007 3:29 PM      Physical Exam  General:     normal appearance.   Eyes:     blind right eye from amblyopia. Ears:     left ear hearing aid. Nose:     no deformity, discharge, inflammation, or lesions Mouth:     Melampatti Class II.   Neck:     no masses, thyromegaly, or abnormal cervical nodes. no JVD. Lungs:     lungs are clear with no coughing, wheezing, rows or rhonchi. Heart:     regular rate and rhythm, S1, S2 without murmurs, rubs, gallops, or clicks  Problem # 1:  COUGH (ICD-786.2) this cough is probably multifactorial.  We will treat for allergic rhinitis.  I have counseled her about reflux precautions, and asthma.  She takes propranolol for tremor.  She will [facility] at [facility] whether something else could be tried long enough to see if propranolol is aggravating her cough.  She has been on it a long time.  Problem # 2:  RHINOSINUSITIS, CHRONIC (ICD-473.8) and discussed environmental precautions.  She has tried nasal steroids.  I'm giving her a sample of astepro nasal spray.  We will consider whether a CT scan of her sinuses would be helpful.  We could also consider allergy testing. Her updated medication list for this problem includes:    Flonase 50 Mcg/act Susp (Fluticasone propionate) ..... Use as directed    Zyrtec Allergy 10 Mg Tabs (Cetirizine hcl) .Marland Kitchen... Take 1 by mouth once daily    Mucinex Dm 30-600 Mg Tb12 (Dextromethorphan-guaifenesin) ..... Use as directed as needed    Tussionex Pennkinetic Er  8-10 Mg/48ml Lqcr (Chlorpheniramine-hydrocodone) ..... Use as directed as needed   Medications Added to Medication List This Visit: 1)  Hyzaar 50-12.5 Mg Tabs (Losartan potassium-hctz) .... Once daily 2)  Norvasc 2.5 Mg Tabs (Amlodipine besylate) .... Once daily 3)  Propranolol Hcl Cr 60 Mg Cp24 (Propranolol hcl) .... Take 2 by mouth once daily 4)  Protonix 40 Mg Tbec (Pantoprazole sodium) .... Take 2 by mouth once daily 5)  Potassium Chloride 20 Meq Pack (Potassium chloride) .... Take 1 by mouth once daily 6)  Flonase 50 Mcg/act Susp (Fluticasone propionate) .... Use as directed 7)  Zyrtec Allergy 10 Mg Tabs (Cetirizine hcl) .... Take 1 by mouth once daily 8)  Mucinex Dm 30-600 Mg Tb12 (Dextromethorphan-guaifenesin) .... Use as directed as needed 9)  Calcium 600 + D 600-400 Mg-unit Tabs (Calcium carbonate-vitamin d) .... Take 2 by mouth once daily 10)  Hydrocodone-acetaminophen 5-500 Mg Tabs (Hydrocodone-acetaminophen) .... As needed vertigo 11)  Centrum Silver Tabs (Multiple vitamins-minerals) .... Take 1 by mouth once daily 12)  Tussionex Pennkinetic Er 8-10 Mg/77ml Lqcr (Chlorpheniramine-hydrocodone) .... Use as directed as needed 13)  Tussionex Pennkinetic Er 8-10 Mg/46ml Lqcr (Chlorpheniramine-hydrocodone) .... 5cc every 12 hours as needed 14)  Pravachol 40 Mg Tabs (Pravastatin sodium) .... Take 1 by mouth once daily   Patient Instructions: 1)  Please schedule a follow-up appointment in 1 month. 2)  Try sample Astepro nasal spray 1-2 puffs each nostril up to twice daily as needed. 3)  Try sample Qvar 80,  2 puffs and rinse twice daily 4)  Consider trying off Propranalol for a while to assess effect on cough    Prescriptions: TUSSIONEX PENNKINETIC ER 8-10 MG/5ML  LQCR (CHLORPHENIRAMINE-HYDROCODONE) 5cc every 12 hours as needed  #238ml x 0   Entered by:   Reynaldo Minium CMA   Authorized by:   Waymon Budge MD   Signed by:   Reynaldo Minium CMA on 05/18/2007   Method used:    Telephoned to ...       [id number]       [address]       [place], [place]  [address]       Ph: [phone] or [phone]       Fax: [phone]   RxID:   [id number]  ]

## 2010-03-03 NOTE — Medication Information (Signed)
Summary[facility]  [facility]   Imported By: Sherian Rein 08/02/2008 11:08:48  _____________________________________________________________________  External Attachment:    Type:   Image     Comment:   External Document

## 2010-03-03 NOTE — Progress Notes (Signed)
Summary: need prescription call to pharmacy  Phone Note Call from Patient   Caller: Patient Call For: young Summary of Call: need qvar 80mg  and astepro 137mg  nasal spray pharmacy [facility] [address]  Initial call taken by: Rickard Patience,  May 29, 2007 2:33 PM  Follow-up for Phone Call        Called spoke with pt advised rx for Qvar and Astepro sent to pharmacy. Follow-up by: Cloyde Reams RN,  May 29, 2007 2:42 PM    New/Updated Medications: ASTEPRO 137 MCG/SPRAY  SOLN (AZELASTINE HCL) Take 1- 2 sprays in each nostril two times a day as needed QVAR 80 MCG/ACT  AERS (BECLOMETHASONE DIPROPIONATE) Inhale 2 puffs two times a day rinse well after   Prescriptions: QVAR 80 MCG/ACT  AERS (BECLOMETHASONE DIPROPIONATE) Inhale 2 puffs two times a day rinse well after  #1 x 5   Entered by:   Cloyde Reams RN   Authorized by:   Waymon Budge MD   Signed by:   Cloyde Reams RN on 05/29/2007   Method used:   Electronically sent to ...       [facility] [address]  [id number]       [address]       [place], [place]  [address]       Ph: [phone] or [phone]       Fax: [phone]   RxID:   [id number] ASTEPRO 137 MCG/SPRAY  SOLN (AZELASTINE HCL) Take 1- 2 sprays in each nostril two times a day as needed  #1 x 5   Entered by:   Cloyde Reams RN   Authorized by:   Waymon Budge MD   Signed by:   Cloyde Reams RN on 05/29/2007   Method used:   Electronically sent to ...       [facility] [address]  [id number]       [address]       [place], [place]  [address]       Ph: [phone] or [phone]       Fax: [phone]   RxID:   [id number]

## 2010-03-03 NOTE — Assessment & Plan Note (Signed)
Summary: 2 months/apc   Primary Debra Lynn/Referring [name]:  Debra Lynn  CC:  2 month follow up visit-sinus and cough.  History of Present Illness: Current Problems:  G E R D (ICD-530.81) RHINOSINUSITIS, CHRONIC (ICD-473.8) COUGH (ICD-786.2) Hx of CVA (ICD-434.91)  11/07/07- This 77 year old woman returns with her daughter today for allergy skin testing as planned.  She has been off of antihistamines and notices increasing sneeze, coughing until she retches, but no wheezing.  Her eyes are not itching.  There is some postnasal drip, and she complains of bilateral maxillary pressure discomfort.  Skipping, propranolol for a few days had no effect on cough, but her heart rate sped up and she was told to get back on it.  She tried astepro, but isn't sure it helped.  Tussionex did help.  Lab results were reviewed.  Eosinophil percentage was a little elevated at 6.6%. IgE was low at 5.7. Skin tests:  appropriate controls.  Mild positive intradermal responses to pollens, dust mite, and some molds. 11/07/07-- OK through summer. Now "allergy season"= head cong, yellow discharge. Chest comfortable. No fever. Can tell no difference Astepro vs Flonase???. Aware of reflux.  01/08/08-Rhinosinusitis cough  Daughter here Took doxycycline for eye infection, then Cipro for UTI.  Got seasonal flu vax, declines H1N1 vax.  Takes propranalol for tachycardia (Has pacemaker) and tremor. Off propranol briefly- no effect on cough seen but cardilogy wanted her bac k on it. Daughter finds allergy vaccine very effective for hersel fand asks mother try- discussed risk benefit. Discussed cost of tussionex.  05/07/08- Rhinosinusitis, cough                     Daughter here and participating. Hosp for CVA on 01/31/09- Speech, Right side. Much improved. Cough has returned some with Spring pollen. Little sputum and denies feeling pndrip or wheeze. Just finished vaccine buildup. We discussed allergy vaccine/ epinephrine in  patient on propranalol.  Needs tusionex refill, but dtr says cough is not what it used to  be,  benzonatate never helped. Cough drops help some.Taking Zyrtec.           Current Medications (verified): 1)  Hyzaar 50-12.5 Mg  Tabs (Losartan Potassium-Hctz) .... Once Daily 2)  Norvasc 2.5 Mg  Tabs (Amlodipine Besylate) .... Once Daily 3)  Propranolol Hcl Cr 60 Mg  Cp24 (Propranolol Hcl) .... Take 2 By Mouth Once Daily 4)  Potassium Chloride 20 Meq  Pack (Potassium Chloride) .... Take 1 By Mouth Once Daily 5)  Calcium 600 + D 600-400 Mg-Unit  Tabs (Calcium Carbonate-Vitamin D) .... Take 2 By Mouth Once Daily 6)  Centrum Silver   Tabs (Multiple Vitamins-Minerals) .... Take 1 By Mouth Once Daily 7)  Pravachol 40 Mg  Tabs (Pravastatin Sodium) .... Take 1 By Mouth Once Daily 8)  Zyrtec Allergy 10 Mg  Tabs (Cetirizine Hcl) .... Take 1 By Mouth Once Daily 9)  Tussionex Pennkinetic Er 8-10 Mg/62ml  Lqcr (Chlorpheniramine-Hydrocodone) .... 5cc Every 12 Hours As Needed 10)  Allergy Vaccine Gh Build Per Protocol  Allergies (verified): No Known Drug Allergies  Past History:  Family History:    Family History Asthma- brother, daughter    heart disease    cancer    mother died age 37 from heart failure    father died age 54 from cancer    1 brother alive age 24--hx of lymphoma,DM,HBP     (07/05/2007)  Social History:    Patient never smoked.  retired Counselling psychologist    married    2 children     (07/05/2007)  Risk Factors:    Alcohol Use: N/A    >5 drinks/d w/in last 3 months: N/A    Caffeine Use: N/A    Diet: N/A    Exercise: N/A  Risk Factors:    Smoking Status: never (05/18/2007)    Packs/Day: N/A    Cigars/wk: N/A    Pipe Use/wk: N/A    Cans of tobacco/wk: N/A    Passive Smoke Exposure: N/A  Past medical, surgical, family and social histories (including risk factors) reviewed, and no changes noted (except as noted below).  Past Medical History:    sick sinus  syndrome with pacemaker and ablation    CVA- 2010    glaucoma    seasonal allergic rhinitis- Pos Skin Tests 07/05/07.    G E R D    Cough  Past Surgical History:    Reviewed history from 05/18/2007 and no changes required:    Cholecystectomy    hysterectomy    ablation    pacemaker with revision, most recently December of 2008    endoscopy and swallowing evaluation  Family History:    Reviewed history from 07/05/2007 and no changes required:       Family History Asthma- brother, daughter       heart disease       cancer       mother died age 5 from heart failure       father died age 34 from cancer       1 brother alive age 77--hx of lymphoma,DM,HBP  Social History:    Reviewed history from 07/05/2007 and no changes required:       Patient never smoked.        retired Designer, industrial/product       married       2 children  Review of Systems      See HPI  The patient denies fever, weight loss, hoarseness, chest pain, syncope, peripheral edema, hemoptysis, abdominal pain, and melena.    Vital Signs:  Patient profile:   77 year old female Height:      63 inches Weight:      161.13 pounds BMI:     28.65 Pulse rate:   68 / minute BP sitting:   108 / 54  (left arm) Cuff size:   regular  Vitals Entered By: Reynaldo Minium CMA (May 07, 2008 1:52 PM)  O2 Sat on room air at rest %:  96 CC: 2 month follow up visit-sinus and cough Comments Medications reviewed with patient Reynaldo Minium CMA  May 07, 2008 1:52 PM    Physical Exam  Additional Exam:  General: A/Ox3; pleasant and cooperative, NAD, SKIN: no rash, lesions NODES: no lymphadenopathy HEENT:  [place]/AT, EOM- WNL, Conjuctivae- clear, PERRLA, TM-WNL, Nose- clear, Throat- red without drainage or exudate. NECK: Supple w/ fair ROM, JVD- none, normal carotid impulses w/o bruits Thyroid- CHEST: Clear to P&A HEART: RRR, no m/g/r heard ABDOMEN: IRJ:JOAC, nl pulses, no edema  NEURO: Mild hesistandy in speech, but  smiles and seems comfortable., head bobbing tremor.      Impression & Recommendations:  Problem # 1:  COUGH (ICD-786.2)  Probably some post nasal drip but not visible today. Possibly cough-equivalent asthma. Will refill tussionex after appropriate discussion.  Other Orders: Est. Patient Level II (16606)  Patient Instructions: 1)  Please schedule a follow-up appointment in 6 months. 2)  Refilled Tussionex for sparing use. 3)  You can use either Claritin/ lortadine, or Zyrtec as an as-needed antihistamine for allergy/ cough. 4)  We will consider stepping up your allergy vaccine one more level when you return. Prescriptions: Sandria Senter ER 8-10 MG/5ML  LQCR (CHLORPHENIRAMINE-HYDROCODONE) 5cc every 12 hours as needed  #252ml x 1   Entered and Authorized by:   Waymon Budge MD   Signed by:   Waymon Budge MD on 05/07/2008   Method used:   Print then Give to Patient   RxID:   [id number]

## 2010-03-03 NOTE — Miscellaneous (Signed)
Summary: Injection Record/[facility]  Injection Record/[facility]   Imported By: Sherian Rein 06/24/2009 12:32:37  _____________________________________________________________________  External Attachment:    Type:   Image     Comment:   External Document

## 2010-03-03 NOTE — Miscellaneous (Signed)
Summary: Injection Record / [facility]    Injection Record / [facility]    Imported By: Lennie Odor 10/03/2009 10:30:52  _____________________________________________________________________  External Attachment:    Type:   Image     Comment:   External Document

## 2010-03-03 NOTE — Miscellaneous (Signed)
Summary: Injection Record/[facility]  Injection Record/[facility]   Imported By: Sherian Rein 06/24/2009 11:54:26  _____________________________________________________________________  External Attachment:    Type:   Image     Comment:   External Document

## 2010-03-03 NOTE — Progress Notes (Signed)
Summary: rx not at pharmacy yet- pt seen 4/6  Phone Note Call from Patient Call back at Home Phone [phone]   Caller: Patient Call For: young Summary of Call: pt states she never received a rx for tussionex. also says that she called 2 days ago and had asked that this be called in to [facility] [address]. still not called in yet. please do so asap please.  Initial call taken by: Tivis Ringer,  May 10, 2008 10:05 AM  Follow-up for Phone Call        Left message on answering machine that Tussionex was called in to [facility] [address] . Renold Genta RCP, LPN  May 11, 979 10:26 AM

## 2010-03-03 NOTE — Miscellaneous (Signed)
Summary: Orders Update  Clinical Lists Changes  Orders: Added new Service order of Flu Vaccine 40yrs + [phone]) - Signed Added new Service order of Admin 1st Vaccine (60454) - Signed Observations: Added new observation of FLU VAXLOT: [id number] (10/24/2008 16:21) Added new observation of FLU VAX EXP: 07/31/2009 (10/24/2008 16:21) Added new observation of FLU VAXBY: Susanne Ford,CMA (AAMA) (10/24/2008 16:21) Added new observation of FLU VAXRTE: IM (10/24/2008 16:21) Added new observation of FLU VAX DSE: 0.5 ml (10/24/2008 16:21) Added new observation of FLU VAXMFR: GlaxoSmithKline (10/24/2008 16:21) Added new observation of FLU VAX SITE: right deltoid (10/24/2008 16:21) Added new observation of FLU VAX: Fluvax 3+ (10/24/2008 16:21)      Immunizations Administered:  Influenza Vaccine # 1:    Vaccine Type: Fluvax 3+    Site: right deltoid    Mfr: GlaxoSmithKline    Dose: 0.5 ml    Route: IM    Given by: Lynnae Sandhoff Ford,CMA (AAMA)    Exp. Date: 07/31/2009    Lot #: [id number]  Flu Vaccine Consent Questions:    Do you have a history of severe allergic reactions to this vaccine? no    Any prior history of allergic reactions to egg and/or gelatin? no    Do you have a sensitivity to the preservative Thimersol? no    Do you have a past history of Guillan-Barre Syndrome? no    Do you currently have an acute febrile illness? no    Have you ever had a severe reaction to latex? no    Vaccine information given and explained to patient? yes    Are you currently pregnant? no

## 2010-03-03 NOTE — Miscellaneous (Signed)
Summary: Routine Allergy Skin Test/[facility]  Routine Allergy Skin Test/[facility]   Imported By: Stephannie Li 07/19/2007 09:09:17  _____________________________________________________________________  External Attachment:    Type:   Image     Comment:   External Document

## 2010-03-03 NOTE — Miscellaneous (Signed)
Summary: Injection Orders / [facility]    Injection Orders / [facility]    Imported By: Lennie Odor 07/01/2009 15:21:34  _____________________________________________________________________  External Attachment:    Type:   Image     Comment:   External Document

## 2010-03-03 NOTE — Miscellaneous (Signed)
Summary: Injection [employer] risk [employer]   Imported By: Sherian Rein 12/24/2009 14:13:19  _____________________________________________________________________  External Attachment:    Type:   Image     Comment:   External Document

## 2010-03-03 NOTE — Assessment & Plan Note (Signed)
Summary: FLU SHOT/[facility]   Allergies: No Known Drug Allergies     SHOT WAS GIVEN BY SUZANNE FORD ON 10/24/2008

## 2010-03-03 NOTE — Miscellaneous (Signed)
Summary: Injection Orders / [facility]    Injection Orders / [facility]    Imported By: Lennie Odor 07/01/2009 17:20:37  _____________________________________________________________________  External Attachment:    Type:   Image     Comment:   External Document

## 2010-03-03 NOTE — Miscellaneous (Signed)
Summary: Injection Record/[facility]  Injection Record/ [facility]ergy   Imported By: Sherian Rein 06/24/2009 08:40:12  _____________________________________________________________________  External Attachment:    Type:   Image     Comment:   External Document

## 2010-03-03 NOTE — Assessment & Plan Note (Signed)
Summary: alllergy skin test////kp   Vital Signs:  Patient Profile:   77 Years Old Female Weight:      162.38 pounds O2 Sat:      95 % O2 treatment:    Room Air Pulse rate:   78 / minute BP sitting:   110 / 80  (right arm) Cuff size:   regular  Vitals Entered By: Reynaldo Minium CMA (July 05, 2007 2:35 PM)             Comments Medications reviewed with patient Reynaldo Minium CMA  July 05, 2007 2:35 PM      Visit Type:  Follow-up PCP:  [name]  Chief Complaint:  allergy testing.  History of Present Illness: Current Problems:  G E R D (ICD-530.81) RHINOSINUSITIS, CHRONIC (ICD-473.8) COUGH (ICD-786.  this 77 year old woman returns with her daughter today for allergy skin testing as planned.  She has been off of antihistamines and notices increasing sneeze, coughing, until she retches, but no wheezing.  Her eyes are not itching.  There is some postnasal drip, and she complains of bilateral maxillary pressure discomfort.  Skipping, propranolol for a few days had no effect on cough, but her heart rate sped up and she was told to get back on it.  She tried astepro, but isn't sure it helped.  Tussionex did help.  Lab results were reviewed.  Eosinophil percentage was a little elevated at 6.6%. IgE was low at 5.7.  Skin tests:  appropriate controls.  Mild positive intradermal responses to pollens, dust mite, and some molds.        Current Allergies (reviewed today): No known allergies   Past Medical History:    Reviewed history from 06/20/2007 and no changes required:       sick sinus syndrome with pacemaker and ablation       glaucoma       seasonal allergic rhinitis       G E R D   Family History:    Family History Asthma- brother, daughter    heart disease    cancer    mother died age 26 from heart failure    father died age 71 from cancer    1 brother alive age 87--hx of lymphoma,DM,HBP  Social History:    Reviewed history from 05/18/2007 and no changes  required:       Patient never smoked.        retired Designer, industrial/product       married       2 children    Review of Systems      See HPI   Physical Exam  GENERAL:  A/Ox3; pleasant & cooperative.NAD, mild head bob. HEENT:  [place]/AT, EOM-wnl, PERRLA, EACs-clear, TMs-wnl, NOSE-clear, THROAT-clear & wnl. [place] NECK:  Supple w/ fair ROM; no JVD; normal carotid impulses w/o bruits; no thyromegaly or nodules palpated; no lymphadenopathy. CHEST: Clear to P&A HEART:  RRR, no m/r/g  heard ABDOMEN:  Soft & nt; nml bowel sounds; no organomegaly or masses detected. EXT: Warm bilat,  no calf pain, edema, clubbing, pulses intact Skin: no rash/lesion      Impression & Recommendations:  Problem # 1:  COUGH (ICD-786.2) increase cough, and she came of antihistamines, together with mild positive skin test responses, at least suggest an allergic component to her cough.  She did not respond to a brief trial off propranolol.  She is not recognizing reflux.  I  will try anti-inflammatory and antihistamine therapies, and we have emphasized encasings for the bedding and environmental precautions.  If we don't find other helpful therapies.  We can talk about whether allergy vaccine would be worth a trial on return. Orders: Est. Patient Level II (95621) Allergy Puncture Test (30865) Allergy I.D Test (78469)   Problem # 2:  G E R D (ICD-530.81) .  Reflux precautions have been reemphasized. Her updated medication list for this problem includes:    Protonix 40 Mg Tbec (Pantoprazole sodium) .Marland Kitchen... Take 2 by mouth once daily    Patient Instructions: 1)  Please schedule a follow-up appointment in 4 months. 2)  Qvar 80, 2 puffs and rinse, twice daily 3)  Tussionex only if needed 4)  Zyrtec as an antihistamine when needed 5)  consider the dust and pollen control measures   Prescriptions: QVAR 80 MCG/ACT  AERS (BECLOMETHASONE DIPROPIONATE) Inhale 2 puffs two times a day rinse well  after  #1 x 5   Entered and Authorized by:   Waymon Budge MD   Signed by:   Waymon Budge MD on 07/05/2007   Method used:   Print then Give to Patient   RxID:   [id number] TUSSIONEX PENNKINETIC ER 8-10 MG/5ML  LQCR (CHLORPHENIRAMINE-HYDROCODONE) 5cc every 12 hours as needed  #234ml x 0   Entered and Authorized by:   Waymon Budge MD   Signed by:   Waymon Budge MD on 07/05/2007   Method used:   Print then Give to Patient   RxID:   [id number]  ]

## 2010-03-03 NOTE — Assessment & Plan Note (Signed)
Summary: rov 2 months///kp   PCP:  [name]  Chief Complaint:  2 month follow up visit .  History of Present Illness: Current Problems:  G E R D (ICD-530.81) RHINOSINUSITIS, CHRONIC (ICD-473.8) COUGH (ICD-786.2)  11/07/07- This 77 year old woman returns with her daughter today for allergy skin testing as planned.  She has been off of antihistamines and notices increasing sneeze, coughing until she retches, but no wheezing.  Her eyes are not itching.  There is some postnasal drip, and she complains of bilateral maxillary pressure discomfort.  Skipping, propranolol for a few days had no effect on cough, but her heart rate sped up and she was told to get back on it.  She tried astepro, but isn't sure it helped.  Tussionex did help.  Lab results were reviewed.  Eosinophil percentage was a little elevated at 6.6%. IgE was low at 5.7. Skin tests:  appropriate controls.  Mild positive intradermal responses to pollens, dust mite, and some molds. 11/07/07-- OK through summer. Now "allergy season"= head cong, yellow discharge. Chest comfortable. No fever. Can tell no difference Astepro vs Flonase???. Aware of reflux.  01/08/08-Rhinosinusitis cough  Daughter here Took doxycycline for eye infection, then Cipro for UTI.  Got seasonal flu vax, declines H1N1 vax.  Takes propranalol for tachycardia (Has pacemaker) and tremor. Off propranol briefly- no effect on cough seen but cardilogy wanted her bac k on it. Daughter finds allergy vaccine very effective for hersel fand asks mother try- discussed risk benefit. Discussed cost of tussionex.          Prior Medications Reviewed Using: Patient Recall  Prior Medication List:  HYZAAR 50-12.5 MG  TABS (LOSARTAN POTASSIUM-HCTZ) once daily NORVASC 2.5 MG  TABS (AMLODIPINE BESYLATE) once daily PROPRANOLOL HCL CR 60 MG  CP24 (PROPRANOLOL HCL) take 2 by mouth once daily PRILOSEC OTC 20 MG TBEC (OMEPRAZOLE MAGNESIUM) 1 once daily POTASSIUM CHLORIDE 20 MEQ   PACK (POTASSIUM CHLORIDE) take 1 by mouth once daily CALCIUM 600 + D 600-400 MG-UNIT  TABS (CALCIUM CARBONATE-VITAMIN D) take 2 by mouth once daily CENTRUM SILVER   TABS (MULTIPLE VITAMINS-MINERALS) take 1 by mouth once daily PRAVACHOL 40 MG  TABS (PRAVASTATIN SODIUM) take 1 by mouth once daily MECLIZINE HCL 25 MG TABS (MECLIZINE HCL) 1 three times a day as needed ZYRTEC ALLERGY 10 MG  TABS (CETIRIZINE HCL) take 1 by mouth once daily ASTEPRO 137 MCG/SPRAY  SOLN (AZELASTINE HCL) Take 1- 2 sprays in each nostril two times a day as needed QVAR 80 MCG/ACT  AERS (BECLOMETHASONE DIPROPIONATE) Inhale 2 puffs two times a day rinse well after NASACORT AQ 55 MCG/ACT AERS (TRIAMCINOLONE ACETONIDE(NASAL)) 1- 2 sprays each nostril once daily TUSSIONEX PENNKINETIC ER 8-10 MG/5ML  LQCR (CHLORPHENIRAMINE-HYDROCODONE) 5cc every 12 hours as needed   Updated Prior Medication List: HYZAAR 50-12.5 MG  TABS (LOSARTAN POTASSIUM-HCTZ) once daily NORVASC 2.5 MG  TABS (AMLODIPINE BESYLATE) once daily PROPRANOLOL HCL CR 60 MG  CP24 (PROPRANOLOL HCL) take 2 by mouth once daily PRILOSEC OTC 20 MG TBEC (OMEPRAZOLE MAGNESIUM) 1 once daily POTASSIUM CHLORIDE 20 MEQ  PACK (POTASSIUM CHLORIDE) take 1 by mouth once daily ZYRTEC ALLERGY 10 MG  TABS (CETIRIZINE HCL) take 1 by mouth once daily CALCIUM 600 + D 600-400 MG-UNIT  TABS (CALCIUM CARBONATE-VITAMIN D) take 2 by mouth once daily CENTRUM SILVER   TABS (MULTIPLE VITAMINS-MINERALS) take 1 by mouth once daily TUSSIONEX PENNKINETIC ER 8-10 MG/5ML  LQCR (CHLORPHENIRAMINE-HYDROCODONE) 5cc every 12 hours as needed PRAVACHOL 40 MG  TABS (  PRAVASTATIN SODIUM) take 1 by mouth once daily ASTEPRO 137 MCG/SPRAY  SOLN (AZELASTINE HCL) Take 1- 2 sprays in each nostril two times a day as needed QVAR 80 MCG/ACT  AERS (BECLOMETHASONE DIPROPIONATE) Inhale 2 puffs two times a day rinse well after MECLIZINE HCL 25 MG TABS (MECLIZINE HCL) 1 three times a day as needed NASACORT AQ 55 MCG/ACT  AERS (TRIAMCINOLONE ACETONIDE(NASAL)) 1- 2 sprays each nostril once daily  Current Allergies (reviewed today): No known allergies   Past Medical History:    Reviewed history from 11/07/2007 and no changes required:       sick sinus syndrome with pacemaker and ablation       glaucoma       seasonal allergic rhinitis- Pos Skin Tests 07/05/07.       G E R D       Cough  Past Surgical History:    Reviewed history from 05/18/2007 and no changes required:       Cholecystectomy       hysterectomy       ablation       pacemaker with revision, most recently December of 2008       endoscopy and swallowing evaluation   Social History:    Reviewed history from 07/05/2007 and no changes required:       Patient never smoked.        retired Designer, industrial/product       married       2 children    Review of Systems      See HPI   Vital Signs:  Patient Profile:   77 Years Old Female Weight:      167 pounds O2 Sat:      96 % O2 treatment:    Room Air Pulse rate:   88 / minute BP sitting:   114 / 78  (left arm) Cuff size:   regular  Vitals Entered By: Reynaldo Minium CMA (January 08, 2008 2:18 PM)             Comments Medications reviewed with patient Reynaldo Minium CMA  January 08, 2008 2:18 PM      Physical Exam  General: A/Ox3; pleasant and cooperative, NAD, SKIN: no rash, lesions NODES: no lymphadenopathy HEENT: [place]/AT, EOM- WNL, Conjuctivae- clear, PERRLA, TM-WNL, Nose- clear, Throat- red without drainage or exudate. NECK: Supple w/ fair ROM, JVD- none, normal carotid impulses w/o bruits Thyroid- normal to palpation CHEST: Clear to P&A HEART: RRR, no m/g/r heard ABDOMEN: Soft and nl; nml bowel sounds; no organomegaly or masses noted HYQ:MVHQ, nl pulses, no edema  NEURO: Grossly intact to observation         Problem # 1:  COUGH (ICD-786.2) Inhaler did not help. This is likely cough due to allergic rhinitis with postnasal drip, plus some GERD. We  reemphasize environmental dust and pollen precautions, and reflux precautions. Plan- Try allergy vaccine.  Problem # 2:  RHINOSINUSITIS, CHRONIC (ICD-473.8) Probable allergic rhinitis.Plan as above. Her updated medication list for this problem includes:    Zyrtec Allergy 10 Mg Tabs (Cetirizine hcl) .Marland Kitchen... Take 1 by mouth once daily    Tussionex Pennkinetic Er 8-10 Mg/73ml Lqcr (Chlorpheniramine-hydrocodone) .Marland Kitchen... 5cc every 12 hours as needed    Astepro 137 Mcg/spray Soln (Azelastine hcl) .Marland Kitchen... Take 1- 2 sprays in each nostril two times a day as needed    Nasacort Aq 55 Mcg/act Aers (Triamcinolone acetonide(nasal)) .Marland Kitchen... 1- 2 sprays  each nostril once daily    Hydromet 5-1.5 Mg/69ml Syrp (Hydrocodone-homatropine) .Marland Kitchen... 1 teaspoon four times a day as needed cough   Medications Added to Medication List This Visit: 1)  Hydromet 5-1.5 Mg/68ml Syrp (Hydrocodone-homatropine) .Marland Kitchen.. 1 teaspoon four times a day as needed cough 2)  Allergy Vaccine Gh Build Per Protocol    Patient Instructions: 1)  Please schedule a follow-up appointment in 2 months. 2)  We will make an allergy vacine and let you know when it is ready to start. 3)  Try the hydromet/ hydrocodone cough syrup as an alternative to tussionex.   Prescriptions: ALLERGY VACCINE GH BUILD PER PROTOCOL   #1 x prn   Entered and Authorized by:   Waymon Budge MD   Signed by:   Waymon Budge MD on 01/17/2008   Method used:   Historical   RxID:   [id number] HYDROMET 5-1.5 MG/5ML SYRP (HYDROCODONE-HOMATROPINE) 1 teaspoon four times a day as needed cough  #200 ml x 1   Entered and Authorized by:   Waymon Budge MD   Signed by:   Waymon Budge MD on 01/08/2008   Method used:   Print then Give to Patient   RxID:   [id number]  ]

## 2010-03-03 NOTE — Assessment & Plan Note (Signed)
Summary: 12 months/apc   Primary Provider/Referring Provider:  [name]  CC:  Follow up visit-allergies..  History of Present Illness:  05/07/08- Rhinosinusitis, cough                     Daughter here and participating. Hosp for CVA on 01/31/09- Speech, Right side. Much improved. Cough has returned some with Spring pollen. Little sputum and denies feeling pndrip or wheeze. Just finished vaccine buildup. We discussed allergy vaccine/ epinephrine in patient on propranalol.  Needs tusionex refill, but dtr says cough is not what it used to  be,  benzonatate never helped. Cough drops help some.Taking Zyrtec.  November 07, 2008 Rhinosinusitis, cough follow up visit-breathing good. Doing well recently. Cough has  been much better. She denies shortness of breath, sneezing, drainage. Eyes do itch- dry. Uses eye drops. She says allergy vaccine definitely helps, especially at dose increase. Had flu vax.  November 07, 2009-  Rhinosinusitis, cough.......daughter here cc: Follow up visit-allergies. Continues allergy vaccine. She says she has done well as long as she has kept up with her shots. She denies any rough times or special problems. Now at 1:10 on vaccine, getting injections here. Daughter was concerned that Mom had had a change in her status- got a little dehydrated but no new CVA and ok now.. Daughter wants me to reinforce that mom doesn't need Zyrtec everyday out of habit. We discussed use of antihistamines.       Preventive Screening-Counseling & Management  Alcohol-Tobacco     Smoking Status: never  Current Medications (verified): 1)  Hyzaar 100-25 Mg Tabs (Losartan Potassium-Hctz) .... Take 1 By Mouth Once Daily 2)  Norvasc 2.5 Mg  Tabs (Amlodipine Besylate) .... Once Daily 3)  Propranolol Hcl Cr 60 Mg  Cp24 (Propranolol Hcl) .... Take 2 By Mouth Once Daily 4)  Potassium Chloride 20 Meq  Pack (Potassium Chloride) .... Take 1 By Mouth Once Daily 5)  Calcium 600 + D 600-400 Mg-Unit   Tabs (Calcium Carbonate-Vitamin D) .... Take 2 By Mouth Once Daily 6)  Centrum Silver   Tabs (Multiple Vitamins-Minerals) .... Take 1 By Mouth Once Daily 7)  Pravachol 40 Mg  Tabs (Pravastatin Sodium) .... Take 1 By Mouth Once Daily 8)  Zyrtec Allergy 10 Mg  Tabs (Cetirizine Hcl) .... Take 1 By Mouth Once Daily 9)  Tussionex Pennkinetic Er 8-10 Mg/20ml  Lqcr (Chlorpheniramine-Hydrocodone) .... 5cc Every 12 Hours As Needed 10)  Allergy Vaccine !:10 Gh 11)  Coumadin 5 Mg Tabs (Warfarin Sodium) .... Take 1/2 Tablet 6 Days A Week and 1/2 Tablet On Tuesdays. 12)  Tylenol Extra Strength 500 Mg Tabs (Acetaminophen) .... Tale As Directed On Bottle 13)  Protonix 40 Mg Tbec (Pantoprazole Sodium) .... Take 1 By Mouth Once Daily 14)  Colestipol Hcl 1 Gm Tabs (Colestipol Hcl) .... Take 1 By Mouth Once Daily  Allergies (verified): No Known Drug Allergies  Past History:  Past Medical History: Last updated: 05/07/2008 sick sinus syndrome with pacemaker and ablation CVA- 2010 glaucoma seasonal allergic rhinitis- Pos Skin Tests 07/05/07. G E R D Cough  Past Surgical History: Last updated: 05/18/2007 Cholecystectomy hysterectomy ablation pacemaker with revision, most recently December of 2008 endoscopy and swallowing evaluation  Family History: Last updated: 07/05/2007 Family History Asthma- brother, daughter heart disease cancer mother died age 74 from heart failure father died age 13 from cancer 1 brother alive age 4--hx of lymphoma,DM,HBP  Social History: Last updated: 07/05/2007 Patient never smoked.  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    Vital Signs:  Patient profile:   77 year old female Height:      63 inches Weight:      159 pounds BMI:     28.27 O2 Sat:      95 % on Room air Pulse rate:   70 / minute BP sitting:   110 / 62  (left arm) Cuff size:   regular  Vitals Entered By: Reynaldo Minium CMA (November 07, 2009 1:39 PM)  O2 Flow:  Room air CC: Follow up visit-allergies.   Physical Exam  Additional Exam:  General: A/Ox3; pleasant and cooperative, NAD, SKIN: no rash, lesions NODES: no lymphadenopathy HEENT: [place]/AT, EOM- WNL, Conjuctivae- clear, PERRLA, TM-WNL, Nose- clear, Throat- normal without drainage or exudate. Mallampatii III NECK: Supple w/ fair ROM, JVD- none, normal carotid impulses w/o bruits Thyroid- CHEST: Clear to P&A HEART: RRR, no m/g/r heard ABDOMEN: Not overweight ZOX:WRUE, nl pulses, no edema  NEURO: Mild hesistandy in speech, but smiles and seems comfortable., minor  head bobbing tremor.      Impression & Recommendations:  Problem # 1:  ALLERGIC RHINITIS (ICD-477.9)  Good long term control. We agreed not to make changes. She will limit antihistamines to as needed use.  Her updated medication list for this problem includes:    Zyrtec Allergy 10 Mg Tabs (Cetirizine hcl) .Marland Kitchen... Take 1 by mouth once daily  Problem # 2:  COUGH (ICD-786.2)  Cough has largely resolved. We considered whether it may prove to be seasonal.  Medications Added to Medication List This Visit: 1)  Hyzaar 100-25 Mg Tabs (Losartan potassium-hctz) .... Take 1 by mouth once daily 2)  Protonix 40 Mg Tbec (Pantoprazole sodium) .... Take 1 by mouth once daily 3)  Colestipol Hcl 1 Gm Tabs (Colestipol hcl) .... Take 1 by mouth once daily  Other Orders: Est. Patient Level III ([address])  Patient Instructions: 1)  Please schedule a follow-up appointment in 1 year. 2)  Continue  allergy vaccine 3)  Try skipping Zyrtec, to take it only on days when you need an antihistamine for allergy.

## 2010-03-05 DIAGNOSIS — J301 Allergic rhinitis due to pollen: Secondary | ICD-10-CM

## 2010-03-05 NOTE — Assessment & Plan Note (Signed)
Summary: Acute NP office visit - sinus inf   Primary Provider/Referring Provider:  [name]  CC:  went to UC 4 days ago for sinus inf - was given e-mycin x5days, finished today.  states symptoms are worse w/ more sinus pressure/congestion, runny, clear nasal drainage, PND, and sore throat.  1 week total for symptoms.  History of Present Illness:  05/07/08- Rhinosinusitis, cough                     Daughter here and participating. Hosp for CVA on 01/31/09- Speech, Right side. Much improved. Cough has returned some with Spring pollen. Little sputum and denies feeling pndrip or wheeze. Just finished vaccine buildup. We discussed allergy vaccine/ epinephrine in patient on propranalol.  Needs tusionex refill, but dtr says cough is not what it used to  be,  benzonatate never helped. Cough drops help some.Taking Zyrtec.  November 07, 2008 Rhinosinusitis, cough follow up visit-breathing good. Doing well recently. Cough has  been much better. She denies shortness of breath, sneezing, drainage. Eyes do itch- dry. Uses eye drops. She says allergy vaccine definitely helps, especially at dose increase. Had flu vax.  November 07, 2009-  Rhinosinusitis, cough.......daughter here cc: Follow up visit-allergies. Continues allergy vaccine. She says she has done well as long as she has kept up with her shots. She denies any rough times or special problems. Now at 1:10 on vaccine, getting injections here. Daughter was concerned that Mom had had a change in her status- got a little dehydrated but no new CVA and ok now.. Daughter wants me to reinforce that mom doesn't need Zyrtec everyday out of habit. We discussed use of antihistamines.      February 06, 2010 --Presents for an acute office visit. Complains of sinus symptoms x 1 week. Went to UC 4 days ago for sinus inf - was given zpack  x5days, finished today.  states symptoms are worse w/ more sinus pressure/congestion, runny, clear nasal drainage, PND, sore throat.     Medications Prior to Update: 1)  Hyzaar 100-25 Mg Tabs (Losartan Potassium-Hctz) .... Take 1 By Mouth Once Daily 2)  Norvasc 2.5 Mg  Tabs (Amlodipine Besylate) .... Once Daily 3)  Propranolol Hcl Cr 60 Mg  Cp24 (Propranolol Hcl) .... Take 2 By Mouth Once Daily 4)  Potassium Chloride 20 Meq  Pack (Potassium Chloride) .... Take 1 By Mouth Once Daily 5)  Calcium 600 + D 600-400 Mg-Unit  Tabs (Calcium Carbonate-Vitamin D) .... Take 2 By Mouth Once Daily 6)  Centrum Silver   Tabs (Multiple Vitamins-Minerals) .... Take 1 By Mouth Once Daily 7)  Pravachol 40 Mg  Tabs (Pravastatin Sodium) .... Take 1 By Mouth Once Daily 8)  Zyrtec Allergy 10 Mg  Tabs (Cetirizine Hcl) .... Take 1 By Mouth Once Daily 9)  Tussionex Pennkinetic Er 8-10 Mg/20ml  Lqcr (Chlorpheniramine-Hydrocodone) .... 5cc Every 12 Hours As Needed 10)  Allergy Vaccine !:10 Gh 11)  Coumadin 5 Mg Tabs (Warfarin Sodium) .... Take 1/2 Tablet 6 Days A Week and 1/2 Tablet On Tuesdays. 12)  Tylenol Extra Strength 500 Mg Tabs (Acetaminophen) .... Tale As Directed On Bottle 13)  Protonix 40 Mg Tbec (Pantoprazole Sodium) .... Take 1 By Mouth Once Daily 14)  Colestipol Hcl 1 Gm Tabs (Colestipol Hcl) .... Take 1 By Mouth Once Daily  Current Medications (verified): 1)  Hyzaar 100-25 Mg Tabs (Losartan Potassium-Hctz) .... Take 1 By Mouth Once Daily 2)  Norvasc 2.5 Mg  Tabs (Amlodipine Besylate) .... Once Daily 3)  Propranolol Hcl Cr 60 Mg  Cp24 (Propranolol Hcl) .... Take 2 By Mouth Once Daily 4)  Potassium Chloride 20 Meq  Pack (Potassium Chloride) .... Take 1 By Mouth Once Daily 5)  Calcium 600 + D 600-400 Mg-Unit  Tabs (Calcium Carbonate-Vitamin D) .... Take 2 By Mouth Once Daily 6)  Centrum Silver   Tabs (Multiple Vitamins-Minerals) .... Take 1 By Mouth Once Daily 7)  Pravachol 40 Mg  Tabs (Pravastatin Sodium) .... Take 1 By Mouth Once Daily 8)  Zyrtec Allergy 10 Mg  Tabs (Cetirizine Hcl) .... Take 1 By Mouth Once Daily 9)  Tussionex  Pennkinetic Er 8-10 Mg/4ml  Lqcr (Chlorpheniramine-Hydrocodone) .... 5cc Every 12 Hours As Needed 10)  Allergy Vaccine !:10 Gh 11)  Coumadin 5 Mg Tabs (Warfarin Sodium) .... Take As Directed Per Dr. Eldridge Dace 12)  Tylenol Extra Strength 500 Mg Tabs (Acetaminophen) .... Tale As Directed On Bottle 13)  Protonix 40 Mg Tbec (Pantoprazole Sodium) .... Take 1 By Mouth Once Daily 14)  Colestipol Hcl 1 Gm Tabs (Colestipol Hcl) .... Take 1 By Mouth Once Daily  Allergies (verified): No Known Drug Allergies  Past History:  Past Medical History: Last updated: 05/07/2008 sick sinus syndrome with pacemaker and ablation CVA- 2010 glaucoma seasonal allergic rhinitis- Pos Skin Tests 07/05/07. 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  states symptoms are worse w/ more sinus pressure/congestion, runny, clear nasal drainage, PND, sore throat.  1 week total for symptoms Is Patient Diabetic?  No Comments Medications reviewed with patient Daytime contact number verified with patient. Boone Master CNA/MA  February 06, 2010 4:22 PM    Physical Exam  Additional Exam:  General: A/Ox3; pleasant and cooperative, NAD, SKIN: no rash, lesions NODES: no lymphadenopathy HEENT: [place]/AT, EOM- WNL, Conjuctivae- clear, PERRLA, TM-WNL, Nose- clear, Throat- normal without drainage or exudate. Mallampatii III NECK: Supple w/ fair ROM, JVD- none, normal carotid impulses w/o bruits Thyroid- CHEST: Clear to P&A HEART: RRR, no m/g/r heard ABDOMEN: Not overweight ZOX:WRUE, nl pulses, no edema  NEURO: Mild hesistandy in speech, but smiles and seems comfortable., minor  head bobbing tremor.      Impression & Recommendations:  Problem # 1:  RHINOSINUSITIS, CHRONIC (ICD-473.8) Acute flare -slowly resolving -no further abx for now Plan  Mucinex  DM two times a day as needed cough/congestion  Saline nasal rinse as needed  Zyrtec 10mg  at bedtime as needed drainage Increase fluids and rest  Tylenol as needed  Hydromet 1-2 tsp every 6hr as needed cough ,may make you sleepy.  Please contact office for sooner follow up if symptoms do not improve or worsen  follow up Dr. Maple Hudson as planned and as needed   Medications Added to Medication List This Visit: 1)  Hydromet 5-1.5 Mg/54ml Syrp (Hydrocodone-homatropine) .Marland Kitchen.. 1-2 tsp every 6 hr as needed cough  may make you sleepy 2)  Coumadin 5 Mg Tabs (Warfarin sodium) .... Take as directed per dr. Eldridge Dace  Other Orders: Est. Patient Level III (16109)  Patient Instructions: 1)  Mucinex DM two times a day as needed cough/congestion  2)  Saline nasal rinse as needed  3)  Zyrtec 10mg  at bedtime as needed drainage 4)  Increase fluids and rest  5)  Tylenol as needed  6)  Hydromet 1-2 tsp every 6hr as needed cough ,may make you sleepy.  7)  Please contact office for sooner follow up if symptoms do not improve or worsen  8)  follow up Dr. Maple Hudson as planned and  as needed  Prescriptions: HYDROMET 5-1.5 MG/5ML SYRP (HYDROCODONE-HOMATROPINE) 1-2 tsp every 6 hr as needed cough  may make you sleepy  #8 oz x 0   Entered and Authorized by:   Rubye Oaks NP   Signed by:   Tammy Parrett NP on 02/06/2010   Method used:   Print then Give to Patient   RxID:   [id number]

## 2010-03-05 NOTE — Progress Notes (Signed)
Summary: req abx asap  Phone Note Call from Patient   Caller: Daughter Harle Battiest Call For: young Summary of Call: daughter says pt is not any better than when seen by tp recently. pt coughing up "a lot of phlegm- yellow-green". daughter requests an abx for this. [facility] [address] daughter # [phone]. requests this asap please.  Initial call taken by: Tivis Ringer, CNA,  February 11, 2010 12:43 PM  Follow-up for Phone Call        Spoke with Cindy-pt has had recent sinus infection and been worse for 2 weeks; saw TP last Friday and was given a breathing treatment and Hydromet cough syrup. Pt was on Zpak at the time from her PCP for infection. Not getting any better and would like something stronger than a Zpak to help clear this up. Arline Asp is aware that [facility] is out of the office until late this afternoon but will answer then. Please advise. Thanks.Reynaldo Minium CMA  February 11, 2010 2:34 PM   Additional Follow-up for Phone Call Additional follow up Details #1::        Suggest Avelox 400 mg, # 7 1 daily, ref x 1 Additional Follow-up by: Waymon Budge MD,  February 11, 2010 4:41 PM    Additional Follow-up for Phone Call Additional follow up Details #2::    Spoke with patients daughter-She is aware of Avelox being sent to [facility] [address]Reynaldo Minium CMA  February 11, 2010 4:52 PM   New/Updated Medications: AVELOX 400 MG TABS (MOXIFLOXACIN HCL) one tablet once a day Prescriptions: AVELOX 400 MG TABS (MOXIFLOXACIN HCL) one tablet once a day  #7 x 1   Entered by:   Carver Fila   Authorized by:   Waymon Budge MD   Signed by:   Reynaldo Minium CMA on 02/11/2010   Method used:   Electronically to        [id number] (retail)       [address]       [place], [place]  [address]       Ph: [phone] or [phone]       Fax: [phone]   RxID:   [id number]

## 2010-03-12 ENCOUNTER — Encounter: Payer: Self-pay | Admitting: Internal Medicine

## 2010-03-12 DIAGNOSIS — J301 Allergic rhinitis due to pollen: Secondary | ICD-10-CM

## 2010-03-19 ENCOUNTER — Ambulatory Visit (INDEPENDENT_AMBULATORY_CARE_PROVIDER_SITE_OTHER): Payer: MEDICARE

## 2010-03-19 DIAGNOSIS — J301 Allergic rhinitis due to pollen: Secondary | ICD-10-CM

## 2010-03-26 ENCOUNTER — Ambulatory Visit (INDEPENDENT_AMBULATORY_CARE_PROVIDER_SITE_OTHER): Payer: MEDICARE

## 2010-03-26 DIAGNOSIS — J301 Allergic rhinitis due to pollen: Secondary | ICD-10-CM

## 2010-03-31 NOTE — Miscellaneous (Signed)
Summary: Injection Record / [facility]    Injection Record / [facility]    Imported By: Lennie Odor 03/27/2010 13:51:30  _____________________________________________________________________  External Attachment:    Type:   Image     Comment:   External Document

## 2010-04-02 ENCOUNTER — Encounter: Payer: Self-pay | Admitting: Internal Medicine

## 2010-04-02 ENCOUNTER — Ambulatory Visit (INDEPENDENT_AMBULATORY_CARE_PROVIDER_SITE_OTHER): Payer: MEDICARE

## 2010-04-02 DIAGNOSIS — J3089 Other allergic rhinitis: Secondary | ICD-10-CM

## 2010-04-02 DIAGNOSIS — J301 Allergic rhinitis due to pollen: Secondary | ICD-10-CM

## 2010-04-02 DIAGNOSIS — J302 Other seasonal allergic rhinitis: Secondary | ICD-10-CM | POA: Insufficient documentation

## 2010-04-09 NOTE — Assessment & Plan Note (Signed)
Summary: ALLERGY/CB   Nurse Visit   Allergies: No Known Drug Allergies  Orders Added: 1)  Allergy Injection (1) [95115] 

## 2010-04-10 ENCOUNTER — Ambulatory Visit (INDEPENDENT_AMBULATORY_CARE_PROVIDER_SITE_OTHER): Payer: MEDICARE

## 2010-04-10 ENCOUNTER — Encounter: Payer: Self-pay | Admitting: Internal Medicine

## 2010-04-10 DIAGNOSIS — J301 Allergic rhinitis due to pollen: Secondary | ICD-10-CM

## 2010-04-14 NOTE — Assessment & Plan Note (Signed)
Summary: ALLERGY/CB   Nurse Visit   Allergies: No Known Drug Allergies  Orders Added: 1)  Allergy Injection (1) [95115] 

## 2010-04-17 ENCOUNTER — Encounter: Payer: Self-pay | Admitting: Internal Medicine

## 2010-04-17 ENCOUNTER — Ambulatory Visit (INDEPENDENT_AMBULATORY_CARE_PROVIDER_SITE_OTHER): Payer: MEDICARE

## 2010-04-17 DIAGNOSIS — J301 Allergic rhinitis due to pollen: Secondary | ICD-10-CM

## 2010-04-21 NOTE — Assessment & Plan Note (Signed)
Summary: allergy/cb  Nurse Visit   Allergies: No Known Drug Allergies  Orders Added: 1)  Allergy Injection (1) [95115] 

## 2010-04-23 ENCOUNTER — Ambulatory Visit (INDEPENDENT_AMBULATORY_CARE_PROVIDER_SITE_OTHER): Payer: MEDICARE

## 2010-04-23 DIAGNOSIS — J301 Allergic rhinitis due to pollen: Secondary | ICD-10-CM

## 2010-04-30 ENCOUNTER — Ambulatory Visit (INDEPENDENT_AMBULATORY_CARE_PROVIDER_SITE_OTHER): Payer: MEDICARE

## 2010-04-30 DIAGNOSIS — J301 Allergic rhinitis due to pollen: Secondary | ICD-10-CM

## 2010-05-07 ENCOUNTER — Ambulatory Visit (INDEPENDENT_AMBULATORY_CARE_PROVIDER_SITE_OTHER): Payer: MEDICARE

## 2010-05-07 DIAGNOSIS — J301 Allergic rhinitis due to pollen: Secondary | ICD-10-CM

## 2010-05-15 ENCOUNTER — Ambulatory Visit (INDEPENDENT_AMBULATORY_CARE_PROVIDER_SITE_OTHER): Payer: MEDICARE

## 2010-05-15 ENCOUNTER — Telehealth: Payer: Self-pay | Admitting: Internal Medicine

## 2010-05-15 DIAGNOSIS — J309 Allergic rhinitis, unspecified: Secondary | ICD-10-CM

## 2010-05-15 NOTE — Telephone Encounter (Signed)
lmomtcb X1 

## 2010-05-18 LAB — BASIC METABOLIC PANEL
BUN: 10 mg/dL (ref 6–23)
BUN: 15 mg/dL (ref 6–23)
BUN: 7 mg/dL (ref 6–23)
CO2: 24 mEq/L (ref 19–32)
CO2: 26 mEq/L (ref 19–32)
CO2: 26 mEq/L (ref 19–32)
Calcium: 8 mg/dL — ABNORMAL LOW (ref 8.4–10.5)
Calcium: 9.1 mg/dL (ref 8.4–10.5)
Calcium: 9.4 mg/dL (ref 8.4–10.5)
Chloride: 101 mEq/L (ref 96–112)
Chloride: 105 mEq/L (ref 96–112)
Chloride: 112 mEq/L (ref 96–112)
Creatinine, Ser: 0.66 mg/dL (ref 0.4–1.2)
Creatinine, Ser: 0.82 mg/dL (ref 0.4–1.2)
Creatinine, Ser: 0.86 mg/dL (ref 0.4–1.2)
GFR calc Af Amer: >60 mL/min (ref >60)
GFR calc Af Amer: >60 mL/min (ref >60)
GFR calc Af Amer: >60 mL/min (ref >60)
GFR calc non Af Amer: >60 mL/min (ref >60)
GFR calc non Af Amer: >60 mL/min (ref >60)
GFR calc non Af Amer: >60 mL/min (ref >60)
Glucose, Bld: 103 mg/dL — ABNORMAL HIGH (ref 70–99)
Glucose, Bld: 98 mg/dL (ref 70–99)
Glucose, Bld: 99 mg/dL (ref 70–99)
Potassium: 2.8 mEq/L — ABNORMAL LOW (ref 3.5–5.1)
Potassium: 2.9 mEq/L — ABNORMAL LOW (ref 3.5–5.1)
Potassium: 3.6 mEq/L (ref 3.5–5.1)
Sodium: 137 mEq/L (ref 135–145)
Sodium: 138 mEq/L (ref 135–145)
Sodium: 141 mEq/L (ref 135–145)

## 2010-05-18 LAB — URINE CULTURE: Colony Count: >=100000

## 2010-05-18 LAB — URINE MICROSCOPIC-ADD ON

## 2010-05-18 LAB — HOMOCYSTEINE: Homocysteine: 4.4 umol/L (ref 4.0–15.4)

## 2010-05-18 LAB — URINALYSIS, ROUTINE W REFLEX MICROSCOPIC
Bilirubin Urine: NEGATIVE
Glucose, UA: NEGATIVE mg/dL
Ketones, ur: NEGATIVE mg/dL
Nitrite: NEGATIVE
Protein, ur: 30 mg/dL — AB
Specific Gravity, Urine: 1.017 (ref 1.005–1.030)
Urobilinogen, UA: 0.2 mg/dL (ref 0.0–1.0)
pH: 7 (ref 5.0–8.0)

## 2010-05-18 LAB — LIPID PANEL
Cholesterol: 144 mg/dL (ref 0–200)
HDL: 41 mg/dL (ref >39)
LDL Cholesterol: 70 mg/dL (ref 0–99)
Total CHOL/HDL Ratio: 3.5 RATIO
Triglycerides: 166 mg/dL — ABNORMAL HIGH (ref <150)
VLDL: 33 mg/dL (ref 0–40)

## 2010-05-18 LAB — CBC
HCT: 28.5 % — ABNORMAL LOW (ref 36.0–46.0)
HCT: 29.4 % — ABNORMAL LOW (ref 36.0–46.0)
Hemoglobin: 9.9 g/dL — ABNORMAL LOW (ref 12.0–15.0)
Hemoglobin: 9.9 g/dL — ABNORMAL LOW (ref 12.0–15.0)
MCHC: 33.8 g/dL (ref 30.0–36.0)
MCHC: 34.8 g/dL (ref 30.0–36.0)
MCV: 87.6 fL (ref 78.0–100.0)
MCV: 88.4 fL (ref 78.0–100.0)
Platelets: 128 10*3/uL — ABNORMAL LOW (ref 150–400)
Platelets: 137 10*3/uL — ABNORMAL LOW (ref 150–400)
RBC: 3.26 MIL/uL — ABNORMAL LOW (ref 3.87–5.11)
RBC: 3.32 MIL/uL — ABNORMAL LOW (ref 3.87–5.11)
RDW: 13 % (ref 11.5–15.5)
RDW: 13.1 % (ref 11.5–15.5)
WBC: 8.5 10*3/uL (ref 4.0–10.5)
WBC: 9.1 10*3/uL (ref 4.0–10.5)

## 2010-05-18 LAB — HEMOGLOBIN A1C
Hgb A1c MFr Bld: 5.5 % (ref 4.6–6.1)
Mean Plasma Glucose: 111 mg/dL

## 2010-05-18 LAB — PROTIME-INR
INR: 1 (ref 0.00–1.49)
Prothrombin Time: 13.1 seconds (ref 11.6–15.2)

## 2010-05-18 MED ORDER — HYDROCODONE-HOMATROPINE 5-1.5 MG/5ML PO SYRP: 5.0000 mL | ORAL_SOLUTION | Freq: Four times a day (QID) | ORAL | Status: DC | PRN

## 2010-05-18 NOTE — Telephone Encounter (Signed)
Per CDY-ok brand name of Hydromet #262ml 1 tsp every 6 hours prn no refills.Vivianne Spence

## 2010-05-18 NOTE — Telephone Encounter (Signed)
Pt aware RX for Hydromet called to her pharmacy and per the pharmacist, they have always filled this as generic. They are not able to get the name brand. Pt aware and is okay with this.

## 2010-05-18 NOTE — Telephone Encounter (Signed)
Spoke w/ pt and she is requesting hydromet be called into pharmacy. Pt wants brand name not generic. Pt c/o dry cough and happens mostly at night. Pt was last seen by TP 02/06/10 and next OV is scheduled 11/06/10 w/ Dr. Maple Hudson. Please advise if okay to send rx for pt. Thanks  KNDA  Carver Fila, CMA

## 2010-05-22 ENCOUNTER — Ambulatory Visit (INDEPENDENT_AMBULATORY_CARE_PROVIDER_SITE_OTHER): Payer: MEDICARE

## 2010-05-22 DIAGNOSIS — J309 Allergic rhinitis, unspecified: Secondary | ICD-10-CM

## 2010-05-28 ENCOUNTER — Ambulatory Visit (INDEPENDENT_AMBULATORY_CARE_PROVIDER_SITE_OTHER): Payer: MEDICARE

## 2010-05-28 DIAGNOSIS — J309 Allergic rhinitis, unspecified: Secondary | ICD-10-CM

## 2010-06-04 ENCOUNTER — Ambulatory Visit (INDEPENDENT_AMBULATORY_CARE_PROVIDER_SITE_OTHER): Payer: MEDICARE

## 2010-06-04 DIAGNOSIS — J309 Allergic rhinitis, unspecified: Secondary | ICD-10-CM

## 2010-06-05 ENCOUNTER — Ambulatory Visit (INDEPENDENT_AMBULATORY_CARE_PROVIDER_SITE_OTHER): Payer: MEDICARE

## 2010-06-05 DIAGNOSIS — J309 Allergic rhinitis, unspecified: Secondary | ICD-10-CM

## 2010-06-11 ENCOUNTER — Ambulatory Visit (INDEPENDENT_AMBULATORY_CARE_PROVIDER_SITE_OTHER): Payer: MEDICARE

## 2010-06-11 DIAGNOSIS — J309 Allergic rhinitis, unspecified: Secondary | ICD-10-CM

## 2010-06-15 ENCOUNTER — Other Ambulatory Visit: Payer: Self-pay | Admitting: Interventional Cardiology

## 2010-06-15 DIAGNOSIS — R918 Other nonspecific abnormal finding of lung field: Secondary | ICD-10-CM

## 2010-06-15 DIAGNOSIS — IMO0001 Reserved for inherently not codable concepts without codable children: Secondary | ICD-10-CM

## 2010-06-16 NOTE — Discharge Summary (Signed)
NAME:  Debra Lynn, Debra Lynn                 ACCOUNT NO.:  [id number]   MEDICAL RECORD NO.:  [id number]          PATIENT TYPE:  INP   LOCATION:  3006                         FACILITY:  [facility]   PHYSICIAN:  Pramod P. Pearlean Brownie, MD    DATE OF BIRTH:  12-29-1928   DATE OF ADMISSION:  02/01/2008  DATE OF DISCHARGE:  02/07/2008                               DISCHARGE SUMMARY   DIAGNOSES AT THE TIME OF DISCHARGE:  1. 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  2. The patient with history of atrial fibrillation, rapid response      heart rate, supraventricular tachycardia, and syncope, not      currently in atrial fibrillation but likely cause of stroke.  3. Hypertension.  4. Pacemaker with history of failed ablation attempt, first one placed      in 1992, last one placed in 2008.  5. Urinary tract infection, chronic etiology, unclear.  Dr. Aldean Ast      working up as an outpatient.  6. Patent foramen ovale, newly found.   MEDICINES AT THE TIME OF DISCHARGE:  1. Centrum 1 tablet a day.  2. Pravachol 40 mg a day.  3. VESIcare 5 mg a day.  4. Hyzaar 100/25 mg a day.  5. Norvasc 2.5 mg a day.  6. Over-the-counter Prevacid 2 tablets a day.  7. Potassium 20 mEq once a day.  8. Calcium/vitamin D b.i.d.  9. Aspirin 81 mg once a day until INR therapeutic, then discontinue.  10.Tussionex at bedtime.  11.Meclizine 25 mg as needed.  12.Propranolol 60 mg b.i.d.  13.Coumadin 7.5 mg x2 days, then 5 mg a day or as directed.   STUDIES PERFORMED:  1. CT of the brain on admission shows vascular density, left brain;      periventricular and corona radiata; white matter hypodensities      compatible with chronic microvascular white matter disease; nasal      mucosal swelling with frontal sinusitis.  Followup CT at 24 hours      shows chronic small ischemic changes, re-demonstrated no acute      hemorrhage or edema noted.  2. Cerebral angiogram shows occluded  prominent superior division of      the left middle cerebral artery, mild calcific atherosclerosis      involving the left internal carotid artery bulb with minimal      associated stenosis.  Unsuccessful attempt at endovascular      revascularization of symptomatic occluded left middle cerebral      artery superior division.  3. MRI not possible secondary to pacemaker.  4. Carotid Doppler shows no internal carotid artery stenosis.  5. A 2-D echocardiogram performed, results are pending.  6. Transesophageal echocardiogram shows patent foramen ovale with      crossover at rest.  No obvious source of embolus.  7. EKG shows paced rhythm.   LABORATORY STUDIES:  Urine culture showed greater than 100,000 colonies,  multiple bacterial morphocyte, nonpredominant.  Urinalysis had 21-50  white blood cells, 21-50 red blood cells, rare bacteria, and  large  leukocyte esterase.  Chemistry showed glucose of 103, otherwise, normal.  CBC with white blood cells 8.5, hemoglobin 9.9, platelets 137.  Homocystine 4.4, hemoglobin A1c 5.5.  Cholesterol 144, triglycerides  166, HDL 41, LDL 70.  Cardiac enzymes on admission were negative.  Coagulation studies were normal.   HISTORY OF PRESENT ILLNESS:  Ms. Debra Lynn is a 77 year old right-  handed Caucasian female who was last known to be normal at 1:00 p.m. the  day of admission.  The family left to go out for an hearing.  They  returned less than an hour later and the patient was lying on the couch  and unable to speak.  She recognized her daughter to whom she complained  of a headache.  She had no nausea or vomiting.  EMS was called and  recognized that she was aphasic and had right hemiparesis.  They brought  her to the hospital as a code stroke.  CT of the brain on admission  showed multiple kink in the subcortical white matter on the left;  however, there was no acute stroke.  The patient received two-third dose  of t-PA and was taken to the Cath Lab  for angiogram, which showed some  occlusion but unsuccessful attempt at revascularization.  She was  admitted to the Neuro ICU for further stroke evaluation.   HOSPITAL COURSE:  A 24-hour post-t-PA CT scan showed no acute  hemorrhage.  No acute infarct was seen on CT scan.  MRI was unable to be  completed secondary to pacemaker, but the patient remained aphasic,  though improved, so I was most certain a stroke is present.  She had a  full stroke workup.  Of note, the patient has a history of syncope and  irregular heartbeat and was treated by Dr. Eldridge Dace.  Dr. Eldridge Dace was  asked to do a TEE looking for embolic source and he strongly feels that  the patient likely had atrial fibrillation again and recommended  Coumadin for secondary stroke prevention.  The patient was placed on  Coumadin at the time of discharge with no Lovenox or heparin bridging.  Dr. Eldridge Dace will follow [place] as an outpatient and adjust Coumadin as  needed.  We will do an outpatient TCD bubble study at Dr. Marlis Edelson  office, as she was found to have a patent foramen ovale, which may or  may not be associated with her current stroke.  We will consider for the  IRIS trial at the time of discharge.  She was also evaluated by PT/OT  and speech therapy, felt to be a good candidate for home health therapy  and for discharge home.   Of note, the patient had abnormal urinalysis during hospital with  preliminary cultures showing multiple species.  The patient has chronic  UTIs and was scheduled for an outpatient bladder ultrasound with Dr.  Aldean Ast this week.  I have encouraged her to follow up with him as an  outpatient.  We will not treat UTI at this time.   CONDITION AT DISCHARGE:  The patient is alert and oriented x3, speech  clear, mild aphasia, no focal weakness in her extremities, gait steady.   DISCHARGE PLAN:  1. Discharge home with family.  2. Home health, PT/OT, and speech therapy.  3. Follow up with Dr.  Eldridge Dace for Coumadin levels.  4. Follow up with primary care physician in 1 month for risk factor      control.  5. Follow up with Dr. Janalyn Shy  Sethi within 1 month when she will be      scheduled for a TCD bubble study and      emboli monitoring.  6. Consider IRIS trial.  7. Lower extremity Dopplers to be completed prior to discharge from      hospital, results are pending.      Annie Main, N.P.    ______________________________  Sunny Schlein. Pearlean Brownie, MD    SB/MEDQ  D:  02/07/2008  T:  02/08/2008  Job:  [id number]   cc:   Courtney Paris, M.D.  Holley Bouche, M.D.  Francisca December, M.D.

## 2010-06-16 NOTE — Consult Note (Signed)
NAME:  Debra Lynn, Debra Lynn                 ACCOUNT NO.:  [id number]   MEDICAL RECORD NO.:  [id number]          PATIENT TYPE:  INP   LOCATION:  3006                         FACILITY:  [facility]   PHYSICIAN:  Corky Crafts, MDDATE OF BIRTH:  [birth year]   DATE OF CONSULTATION:  02/05/2008  DATE OF DISCHARGE:                                 CONSULTATION   REASON FOR CONSULTATION:  Consult for TEE.   HISTORY OF PRESENT ILLNESS:  Debra Lynn is a 77 year old female admitted  with an embolic left MCA branch infarct.  She underwent tPA infusion and  had a failed attempt by Neuro Interventional Radiology to lyse clots in  distal superior temporal branch.  Initially, the patient was intubated  for less than 24 hours for respiratory failure.  On admission, she has  hemiparesis, which resolved though she has persistent residual aphasia.  We are asked to evaluate by TEE for cardiac source.   She has a history of cardiac catheterization in 2007 for false positive  Cardiolite study.  This cath showed no CAD, but did show apical  dyskinesis with an EF of around 40%.  Apparently, Coumadin was discussed  at that time, but the patient did not want Coumadin therapy as part of  the treatment.  She has also had a history of atrial fibrillation and  underwent ablation therapy in the 1990s.  She describes it as being  over ablated and ultimately required a permanent pacemaker.  The  battery has been changed once and then needed to be re-changed again in  2008.  At that time, the device was upgraded to a BiV device, St. Jude  model.   PAST MEDICAL HISTORY:  1. GERD.  2. Hiatal hernia.  3. History of esophageal dilatation.  4. History of hypertension.  5. Remote atrial fibrillation as described above with permanent      pacemaker.  6. History of hysterectomy, cholecystectomy, and lumbar laminectomy.   SOCIAL HISTORY:  Lives in [place] with her husband.  Denies any  tobacco or illicit drug use.  Rare  alcohol use.   FAMILY HISTORY:  Mother died of CVA.  Father died of brain tumor.   REVIEW OF SYSTEMS:  As above otherwise negative.   ALLERGIES:  No known drug allergies.   MEDICATIONS:  1. Activase.  2. Norvasc 2.5 mg a day.  3. Enteric-coated aspirin 325 mg a day.  4. Os-Cal daily.  5. Enablex.  6. Lovenox q.24 h.  7. Hydrochlorothiazide 25 mg a day.  8. Cozaar 100 mg a day.  9. Multivitamin daily.  10.Protonix 40 mg a day.  11.K-Dur 20 mEq a day.  12.Inderal 60 mg p.o. b.i.d.  13.Zocor 20 mg a day.   PHYSICAL EXAMINATION:  VITAL SIGNS:  Pulse 61, temp 98.2, respirations  18, blood pressure 136/75, O2 saturation 97% on room air.  GENERAL:  She is in no acute distress.  HEENT:  Grossly normal.  NECK:  No carotid or subclavian bruits.  No JVD or thyromegaly.  Sclerae  clear.  Conjunctivae normal.  Nares without drainage.  CHEST:  Clear  to auscultation bilaterally.  No wheezing or rhonchi.  HEART:  Regular rate and rhythm.  No gross murmur.  ABDOMEN:  Good bowel sounds.  Nontender and nondistended.  No new  masses.  No bruits.  EXTREMITIES:  No peripheral edema.  SKIN:  Warm and dry.  CRANIAL NERVES:  She does have some aphasia.  PSYCHIATRIC:  Normal mood and affect.   Chest x-ray, this was while she was intubated, showed no active  findings.   LABORATORY STUDIES:  Hemoglobin 9.9, hematocrit 29.4, and platelets 137.  Potassium 2.9, repleted; BUN 7; creatinine 0.66; glucose 98.  Cardiac  enzymes negative x1.  Homocysteine 4.4.  Hemoglobin A1c 5.5.  Total  cholesterol 144, triglycerides 166, HDL 41, LDL 70.  EKG was  ventricularly paced.   ASSESSMENT AND PLAN:  1. Left middle cerebral artery stroke status post tPA with persistent      aphasia.  2. Hypertension.  3. History of atrial fibrillation.  4. Status post permanent pacemaker with upgrade to biventricular      device in 2008, secondary to apical dyskinesia.   Plans are for the patient to have a TEE tomorrow,  February 06, 2008, to  evaluate for cardiac source of embolization.      Guy Franco, P.A.      Corky Crafts, MD  Electronically Signed    LB/MEDQ  D:  02/05/2008  T:  02/06/2008  Job:  [id number]   cc:   Holley Bouche, M.D.  Pramod P. Pearlean Brownie, MD

## 2010-06-16 NOTE — H&P (Signed)
NAME:  Debra Lynn, Debra Lynn                 ACCOUNT NO.:  [id number]   MEDICAL RECORD NO.:  [id number]          PATIENT TYPE:  INP   LOCATION:  3106                         FACILITY:  [facility]   PHYSICIAN:  Deanna Artis. Hickling, M.D.DATE OF BIRTH:  [birth year]   DATE OF ADMISSION:  02/01/2008  DATE OF DISCHARGE:                              HISTORY & PHYSICAL   CHIEF COMPLAINT:  Unable to speak, right-sided weakness, and facial  droop.   HISTORY:  The patient was last known normal around 1 p.m.  Family left  to go out for an errand.  They returned less than an hour later, the  patient was lying on the couch, unable to speak, recognized her daughter  to whom she complained of headache.  She had no nausea or vomiting.  EMS  was called and recognized that she was aphasic and had right  hemiparesis.   She was transported to [facility].  CT scan of the brain  revealed multiple lacunes in the subcortical white matter on the left  side, however, there was no acute stroke and no evidence of increased  signal in the proximal vessels of the circle of Willis.   There was a sense of less prominent sulci over the left hemisphere than  the right, however, no evidence of white matter change surrounding that  region to suggest a regional stroke.   PAST MEDICAL HISTORY:  1. Hypertension.  2. The patient had a history of atrial fibrillation, rapid response,      heart rate, supraventricular tachycardia, and syncope.  3. She had a first of 3 pacemakers placed in 1992, this follows a      failed ablation attempt.  4. Her last pacemaker was placed in December 2008.   PAST SURGICAL HISTORY:  Multiple surgical procedures:  1. Hysterectomy in 1975.  2. Breast biopsies.  3. Cholecystectomy in late 1980s.  4. Iridectomy in 2006.  5. Esophageal dilatation in May 2007.  6. Lumbar laminectomy, time unknown.  7. The patient was noted on her most recent cardiac angiogram to have      apical dyskinesis.   Coumadin was considered, but was not started.   Her current risk factors then include hypertension, prior strokes, and  heart disease.   CURRENT MEDICATIONS:  1. Hyzaar 100/25 one daily.  2. Amlodipine 2.5 mg daily.  3. Prevacid 40 mg daily.  4. Potassium chloride 20 mEq daily.  5. Zyrtec 10 mg daily.  6. Calcium 600 mg plus vitamin D 1 twice daily.  7. Aspirin 81 mg daily.  8. Centrum Silver 1 daily.  9. Pravastatin 40 mg daily.  10.Vesicare 5 mg daily.  11.P.r.n. medicines with Tussionex and Nasacort.   DRUG ALLERGIES:  None known.   FAMILY HISTORY:  Mother had stroke and died of stroke.  Father died of  brain tumor.  Brother has lost a kidney and has leukemia.  Her children  are healthy.   SOCIAL HISTORY:  The patient is married.  She has 2 years of college.  She does not use tobacco.  She rarely drinks wine.  Her children do not  know about that.  She does not use drugs.   REVIEW OF SYSTEMS:  The patient has tremor of her head.  She has been  processing things more slowly.  Recently, she also is hard of hearing  and wears a hearing aid.  Cerebrovascular disease has been described  above.  Recently, she has had problems with diarrhea over the past  several weeks and multiple urinary tract infections.  She has also had  some form of an eye infection.  She has osteoarthritis.  She does not  have diabetes.  She is postmenopausal.  She has allergies to grass,  trees, mold, and dust mites.  She has no psychiatric disorder.   PHYSICAL EXAMINATION:  VITAL SIGNS:  Today, temperature is not obtained,  blood pressure 141/86, resting pulse 72, respirations 18, and oxygen  saturation 100.  She is approximately 165 pounds.  HEAD, EYES, EARS, NOSE, AND THROAT:  No infections.  No bruits.  LUNGS:  Clear.  HEART:  No murmurs.  Bowel sounds normal.  No hepatosplenomegaly.  SKIN:  Unremarkable.  There seems to be a slight bruise on the left side  of her head.  We do not know if she is  fallen, this was not witnessed.  NEUROLOGIC:  Mental status:  The patient is aphasic.  She is able to  understand much of what I say.  She cannot repeat phrases.  She has  difficulty completing sentences.  She has significant dysarthria.  Cranial nerves, round and reactive pupils.  Visual fields full to  objects brought in from the periphery to double simultaneous stimuli.  Symmetric facial strength.  Midline tongue.  Motor examination:  The  patient had normal grips.  Good fine motor skills.  Normal strength in  her arms and legs.  Reflexes are symmetric and diminished.  The patient  had bilateral flexor plantar responses.  She has decreased sensation in  the right arm and leg, but does not extinguish.  I am not certain that  she fully understands the commands.  Her NIH stroke scale is 16, this  was done at 1440 hours.  Her modified Rankin prior to this was 0.   IMPRESSION:  Left Brain stroke, non-hemmorhagic branch occlusion of the  left middle cerebral artery, suspect embolic source  478.29   TEST RESULTS:  Sodium 138, potassium 3.7, chloride 102, CO2 of 27, BUN  16, creatinine 1.01, and glucose 103.  White blood cell count 7400,  hematocrit 34.9, hemoglobin 11.9, and platelet count 146,000.  Polys 62,  lymphs 28, monos 9, and eosinophils 2.  PT 13.3, INR 1.0, and PTT was  27.   OTHER LABORATORY STUDIES:  Bilirubin 0.9, alkaline phosphatase 35, AST  28, ALT 17, and total protein 6.7.  Her EKG showed a sinus rhythm with a  left atrial abnormality, right bundle-branch block, left atrial  fascicular block.   The patient's cardiac enzymes were negative.  The patient received two-  thirds t-PA.  She has been taken to the cath lab.  I spent from 1420  hours to 1600 hours at her bedside evaluating the patient who was  critically ill, decision making concerning IV and t-PA, talking with the  neuro interventionist speaking with the family to obtain history and to  discuss her prognosis and  findings, and monitoring her angiogram.      Deanna Artis. Sharene Skeans, M.D.  Electronically Signed     WHH/MEDQ  D:  02/01/2008  T:  02/02/2008  Job:  [id number]   cc:   Holley Bouche, M.D.  Francisca December, M.D.

## 2010-06-16 NOTE — Op Note (Signed)
NAME:  Debra, Lynn                 ACCOUNT NO.:  [id number]   MEDICAL RECORD NO.:  [id number]          PATIENT TYPE:  INP   LOCATION:  2807                         FACILITY:  [facility]   PHYSICIAN:  Francisca December, M.D.  DATE OF BIRTH:  1928-02-06   DATE OF PROCEDURE:  01/24/2007  DATE OF DISCHARGE:                               OPERATIVE REPORT   PROCEDURES PERFORMED:  1. Explant old pacing generator.  2. Implant left ventricular lead.  3. Insert biventricular pacemaker.  4. Left subclavian venogram.  5. Coronary sinus venogram.   INDICATIONS:  Debra Lynn is a 77 year old woman who is pacemaker  dependent, now 16 years after initial implant for over-aggressive AV  nodal ablation.  She has already had one battery change and this device  has lasted four years after the initial one lasted 12.  Because of  chronic fatigue, mild LV dysfunction with an EF of 40-45% and apical  akinesis consistent with long-term right ventricular pacing, as well as  battery now at elective replacement interval, she is brought to  catheterization laboratory for insertion of a new biventricular  pacemaker and upgrade to biventricular device with placement of an LV  lead.   PROCEDURE NOTE:  The patient is brought to the cardiac catheterization  laboratory in the fasting state.  The right prepectoral region was  prepped and draped in the usual sterile fashion.  Local anesthesia was  obtained with infiltration throughout the right prepectoral region and  this was lidocaine with epinephrine.  A 6-7 cm incision was then made  over the old incision site and this was carried down by sharp and blunt  dissection to the pacemaker capsule.  This was incised and the capsule  delivered without difficulty.  The leads were disconnected from the  pacing generator and tested for adequate pacing parameters.  This is  reported below.  The left ventricular lead was left attached to the  analyzer and the pacing at 60 beats  per minute carried out throughout  the case via the a chronic RV lead.   A right subclavian venogram was then performed with a peripheral  injection of 20 mL of Omnipaque.  A digital cine angiogram was obtained  and road mapped to guide the future left subclavian puncture.  The  venogram did demonstrate the vein to be widely patent and coursing in a  normal fashion over the anterior surface of the first rib and beneath  the middle third of the clavicle.  A left subclavian puncture was then  performed with an 18-gauge thin-wall needle through which was passed an  0.038 tight J guidewire.  Over this guidewire, the St. Jude CPS direct  SL guiding catheter with dilator was advanced over the wire to the right  atrium.  Using standard technique and fluoroscopic landmarks, the  coronary sinus was cannulated without extensive difficulty.  A coronary  sinus venogram was then performed after a ballooned flow obstructing  catheter was placed and inflated.  This was done by hand injection and  performed in the LAO and RAO projections.  Two left  lateral veins were  identified that would provide adequate lateral wall pacing.  The balloon  occluding catheter was removed and attempts to cannulate the left  lateral vein.  The more superior were unsuccessful with a guidewire.  A  CPS Aim inner catheter was advanced but this still did not allow for  cannulation of the vein adequately.  It was noted, however, the guiding  catheter had kinked thought at the intersection of the right subclavian  vein and the innominate.  Therefore, I removed the guiding catheter  after placing a long guiding J-wire into the coronary sinus and replaced  with a Medtronic MB-2 again over a dilator.  The dilator and wire were  removed and at this point it was noted that the more inferior lateral  vein was subselected.  I did cannulate this with the left ventricular  lead and found adequate pacing parameters but was not satisfied  with the  separation between the RV and LV leads.  Therefore, the left ventricular  lead was removed and a St. Jude sub-guiding catheter AC-4 was advanced  into place and I was able to cannulate subselectively the more superior  lateral vein.  A Whisper wire was advanced into this vein as well as a  0.014 Iron Man.  The subselecting catheter was removed and the left  ventricular lead advanced into the more superolateral vein successfully.  There, excellent pacing parameters were obtained as will be noted below.  There was diaphragmatic capture seen at 4-5 volts but the threshold was  0.5 volts.   The left ventricular lead was then sutured into place using three  separate 0 silk ligatures.  The pocket was copiously irrigated using 1%  kanamycin solution.  The pocket was then enlarged by blunt dissection  and the leads then attached to the new pacing generator carefully  identifying each by its serial number and placing each into the  appropriate receptacle.  Each lead was tightened into place and tested  for security.  The leads were then wound beneath the pacing generator  and the generator was placed in the pocket.  There was some Surgicel  applied for some chronic oozing due to the blunt dissection to enlarge  the pocket.  The pocket was then closed using 2-0 Vicryl in a running  fashion for the subcutaneous layer.  The skin was approximated using 4-0  Vicryl in a running subcuticular fashion.  Steri-Strips and a sterile  dressing were applied and the patient is transported to recovery area in  stable condition in an A sense biventricular pace mode.   EQUIPMENT DATA:  The explanted pulse generator is a Marketing executive [id number], serial number [id number].  Date of implant was July 17, 2002.  The biventricular pacemaker is a Estate agent number  [id number], serial number [id number].  The atrial lead detected a 2.9 mV P-wave.  The pacing threshold was 0.8 volts at 0.5  millisecond pulse width.  The  impedance was 748 ohms resulting in a current at capture threshold of  1.2 MA.  The ventricular lead had a threshold of 1.3 volts at 0.5  milliseconds pulse width.  The impedance was approximately 1200 ohms.  This was a chronic high impedance Medtronic lead.  The current at  capture threshold was 2 MA.   The ventricular lead had a pacing threshold of 0.5 volts at 0.5  millisecond pulse width.  The impedance was 1070 ohms, resulting in a  current  at capture threshold of 0.7 MA.   The left ventricular lead was a St. Jude QuickFlex model number [id number],  serial number [id number].      Francisca December, M.D.  Electronically Signed     JHE/MEDQ  D:  01/24/2007  T:  01/24/2007  Job:  [id number]   cc:   Corky Crafts, MD

## 2010-06-18 ENCOUNTER — Ambulatory Visit
Admission: RE | Admit: 2010-06-18 | Discharge: 2010-06-18 | Disposition: A | Discharge status: Home / Self Care | Payer: MEDICARE | Source: Ambulatory Visit | Attending: Interventional Cardiology | Admitting: Interventional Cardiology

## 2010-06-18 DIAGNOSIS — R918 Other nonspecific abnormal finding of lung field: Secondary | ICD-10-CM

## 2010-06-18 DIAGNOSIS — IMO0001 Reserved for inherently not codable concepts without codable children: Secondary | ICD-10-CM

## 2010-06-18 MED ORDER — IOHEXOL 300 MG/ML  SOLN
100.0000 mL | Freq: Once | INTRAMUSCULAR | Status: AC | PRN
Start: 2010-06-18 — End: 2010-06-18
  Administered 2010-06-18: 100 mL via INTRAVENOUS

## 2010-06-19 ENCOUNTER — Ambulatory Visit (INDEPENDENT_AMBULATORY_CARE_PROVIDER_SITE_OTHER): Payer: Medicare Other

## 2010-06-19 DIAGNOSIS — J309 Allergic rhinitis, unspecified: Secondary | ICD-10-CM

## 2010-06-19 NOTE — Cardiovascular Report (Signed)
NAME:  Debra Lynn, Debra Lynn                 ACCOUNT NO.:  [id number]   MEDICAL RECORD NO.:  [id number]          PATIENT TYPE:  OIB   LOCATION:  2899                         FACILITY:  [facility]   PHYSICIAN:  Corky Crafts, MDDATE OF BIRTH:  May 18, 1928   DATE OF PROCEDURE:  DATE OF DISCHARGE:                            CARDIAC CATHETERIZATION   PROCEDURES PERFORMED:  1. Left heart catheterization.  2. Left ventriculogram.  3. Coronary angiogram.  4. Abdominal aortogram.   OPERATOR:  Dr. Eldridge Dace.   INDICATIONS FOR TEST:  Abnormal stress test and chest pain.   PROCEDURE NARRATIVE:  The risks and benefits of cardiac catheterization  were explained to the patient and informed consent was obtained. The  patient was brought to the cath lab and placed on the table.  She was  prepped and draped in the usual sterile fashion.  Her right groin was  infiltrated with 1%  lidocaine.  A 6-French arterial sheath was placed  in to her right femoral artery using modified Seldinger technique.  A  left coronary artery angiography was initially attempted with a JL-4  catheter; this was too short a catheter.  A JL-5 catheter was then  successfully used to image the left coronary artery system.  Digital  angiography was performed with multiple projections using hand injection  of contrast. A right coronary artery angiography was then performed  using a JR-4 catheter.  The catheter was advanced to the left ostium and  placed in the vessel under fluoroscopic guidance.  Digital angiography  was performed in multiple projections using hand injection of contrast.  The ventriculogram was then performed. A pigtail catheter was advanced  to the aortic valve and into the left ventricle.  A power injection of  contrast was done in the 30 degree RAO position.  The catheter was  pulled back under continuous hemodynamic pressure monitoring.  The  catheter was then pulled back to the level of the renal arteries.  A  power injection of contrast was done in the AP projection.  A StarClose  was deployed in the right groin for hemostasis   FINDINGS:  Left main is widely patent.  The left circumflex is a medium-  sized vessel which is angiographically normal.  There is a medium-sized  OM-1 which is angiographically normal.  The left anterior descending is  a large vessel which is angiographically normal.  There is a large  branching first diagonal which also appears angiographically normal.  The right coronary artery is a large dominant vessel with mild  irregularities in the proximal portion of the vessel.  The left  ventriculogram showed apical dyskinesis. The estimated ejection fraction  was 40-45%.  The abdominal aortogram showed single renal arteries  bilaterally.  There was no abdominal aortic aneurysm.   HEMODYNAMICS:  Left ventricular pressure of 139/7 with a LVEDP of 16  mmHg.  Aortic pressure of 137/77 with a mean aortic pressure of 103  mmHg.   IMPRESSION:  1. No significant coronary artery disease.  2. Apical dyskinesis with an ejection fraction of 40-45%.  3. No renal artery stenosis.  RECOMMENDATIONS:  The patient should continue with aggressive medical  therapy including aspirin, ACE inhibition, beta blocker, and statin.  She should continue with exercise and other preventive therapy.  I will  follow up with the patient in the office.      Corky Crafts, MD  Electronically Signed     JSV/MEDQ  D:  01/28/2006  T:  01/28/2006  Job:  [id number]

## 2010-06-19 NOTE — Op Note (Signed)
NAME:  Debra Lynn, Debra Lynn                 ACCOUNT NO.:  [id number]   MEDICAL RECORD NO.:  [id number]          PATIENT TYPE:  AMB   LOCATION:  ENDO                         FACILITY:  [facility]   PHYSICIAN:  Danise Edge, M.D.   DATE OF BIRTH:  1928-07-30   DATE OF PROCEDURE:  06/22/2005  DATE OF DISCHARGE:                                 OPERATIVE REPORT   REFERRING PHYSICIAN:  Dr. Majel Homer, [facility].   PROCEDURE INDICATIONS:  [name] is a 77 year old female born  1928-08-07.  [name] has a chronic dry cough, chronic  gastroesophageal reflux, and intermittent breakthrough heartburn on her  daily proton pump inhibitor therapy.  She occasionally experiences dysphagia  without odynophagia.  Approximately 3 years ago she underwent esophageal  dilation.   [name] is also experiencing intermittent hematuria.  She has not been  evaluated by a urologist as yet.   ENDOSCOPIST:  Danise Edge, M.D.   PREMEDICATION:  Fentanyl 50 mcg, Versed 4 mg.   PROCEDURE:  After obtaining informed consent, [name] was placed in the  left lateral decubitus position on the fluoroscopy table.  I administered  intravenous fentanyl and intravenous Versed to achieve conscious sedation  for the procedure.  The patient's blood pressure, oxygen saturation and  cardiac rhythm were monitored throughout the procedure and documented in the  medical record.   The Olympus gastroscope was passed through the posterior hypopharynx into  the proximal esophagus without difficulty.  The hypopharynx, larynx and  vocal cords appeared normal.   Esophagoscopy:  The proximal, mid and lower segments of the esophageal  mucosa appear completely normal.  The squamocolumnar junction is noted at  approximately 36 cm from the incisor teeth.  There is no endoscopic evidence  for the presence of erosive esophagitis, esophageal mucosal scarring,  esophageal stricture formation or Barrett's  esophagus.   Gastroscopy:  [name] has a small hiatal hernia.  Retroflexed view of the  gastric cardia and fundus was normal.  The diaphragmatic hiatus is patulous.  The gastric body and pylorus appeared normal.   Duodenoscopy:  The duodenal bulb and descending duodenum appeared normal.   Savary esophageal dilation.  The Savary dilator wire was passed through the  gastroscope.  The tip of the guidewire advanced to the distal gastric antrum  and as confirmed endoscopically and fluoroscopically.  A 15-mm Savary  dilator passed without resistance.  Repeat esophagogastrostomy revealed an  intact esophageal mucosa indicating that an esophageal stricture is not  present.  There is no gastric trauma due to the guidewire.   ASSESSMENT:  Normal esophagogastroduodenoscopy except for the presence of a  small hiatal hernia and patulous diaphragmatic hiatus in a patient with  chronic gastroesophageal reflux and dysphagia.   RECOMMENDATIONS:  1.  Continue proton pump inhibitor each morning.  Consider adding a H2      blocker at bedtime.  2.  I will arrange a swallowing study through speech pathology to rule out      tracheal penetration with swallowing that could be contributing to her  chronic cough.  3.  I would recommend referral to a urologist for cystoscopy to evaluate her      hematuria.           ______________________________  Danise Edge, M.D.     MJ/MEDQ  D:  06/22/2005  T:  06/22/2005  Job:  [id number]   cc:   Royetta Crochet, MD  Fax: [phone]

## 2010-06-25 ENCOUNTER — Ambulatory Visit (INDEPENDENT_AMBULATORY_CARE_PROVIDER_SITE_OTHER): Payer: Medicare Other

## 2010-06-25 DIAGNOSIS — J309 Allergic rhinitis, unspecified: Secondary | ICD-10-CM

## 2010-07-02 ENCOUNTER — Ambulatory Visit (INDEPENDENT_AMBULATORY_CARE_PROVIDER_SITE_OTHER): Payer: Medicare Other

## 2010-07-02 DIAGNOSIS — J309 Allergic rhinitis, unspecified: Secondary | ICD-10-CM

## 2010-07-06 ENCOUNTER — Encounter: Payer: Self-pay | Admitting: Internal Medicine

## 2010-07-08 ENCOUNTER — Ambulatory Visit (INDEPENDENT_AMBULATORY_CARE_PROVIDER_SITE_OTHER): Payer: Medicare Other

## 2010-07-08 DIAGNOSIS — J309 Allergic rhinitis, unspecified: Secondary | ICD-10-CM

## 2010-07-17 ENCOUNTER — Ambulatory Visit (INDEPENDENT_AMBULATORY_CARE_PROVIDER_SITE_OTHER): Payer: Medicare Other

## 2010-07-17 DIAGNOSIS — J309 Allergic rhinitis, unspecified: Secondary | ICD-10-CM

## 2010-07-23 ENCOUNTER — Ambulatory Visit (INDEPENDENT_AMBULATORY_CARE_PROVIDER_SITE_OTHER): Payer: Medicare Other

## 2010-07-23 DIAGNOSIS — J309 Allergic rhinitis, unspecified: Secondary | ICD-10-CM

## 2010-07-30 ENCOUNTER — Ambulatory Visit (INDEPENDENT_AMBULATORY_CARE_PROVIDER_SITE_OTHER): Payer: Medicare Other

## 2010-07-30 DIAGNOSIS — J309 Allergic rhinitis, unspecified: Secondary | ICD-10-CM

## 2010-08-06 ENCOUNTER — Ambulatory Visit (INDEPENDENT_AMBULATORY_CARE_PROVIDER_SITE_OTHER): Payer: Medicare Other

## 2010-08-06 DIAGNOSIS — J309 Allergic rhinitis, unspecified: Secondary | ICD-10-CM

## 2010-08-13 ENCOUNTER — Ambulatory Visit (INDEPENDENT_AMBULATORY_CARE_PROVIDER_SITE_OTHER): Payer: Medicare Other

## 2010-08-13 DIAGNOSIS — J309 Allergic rhinitis, unspecified: Secondary | ICD-10-CM

## 2010-08-20 ENCOUNTER — Ambulatory Visit (INDEPENDENT_AMBULATORY_CARE_PROVIDER_SITE_OTHER): Payer: Medicare Other

## 2010-08-20 DIAGNOSIS — J309 Allergic rhinitis, unspecified: Secondary | ICD-10-CM

## 2010-08-24 ENCOUNTER — Other Ambulatory Visit: Payer: Self-pay | Admitting: *Deleted

## 2010-08-24 MED ORDER — HYDROCODONE-HOMATROPINE 5-1.5 MG/5ML PO SYRP: 5.0000 mL | ORAL_SOLUTION | Freq: Four times a day (QID) | ORAL | Status: DC | PRN

## 2010-08-24 NOTE — Telephone Encounter (Signed)
rx telephoned to [facility] [address].  Will sign off on message.

## 2010-08-24 NOTE — Telephone Encounter (Signed)
FAX RECEIVED FROM THE PHARMACY FOR REFILL ON HYDROMET.  LAST FILLED ON 05/18/2010.  LAST OV WAS 03/01/2010.  PLS ADVISE.

## 2010-08-24 NOTE — Telephone Encounter (Signed)
Ok to refill as per last script

## 2010-08-26 ENCOUNTER — Ambulatory Visit (INDEPENDENT_AMBULATORY_CARE_PROVIDER_SITE_OTHER): Payer: Medicare Other

## 2010-08-26 DIAGNOSIS — J309 Allergic rhinitis, unspecified: Secondary | ICD-10-CM

## 2010-09-03 ENCOUNTER — Ambulatory Visit (INDEPENDENT_AMBULATORY_CARE_PROVIDER_SITE_OTHER): Payer: Medicare Other

## 2010-09-03 DIAGNOSIS — J309 Allergic rhinitis, unspecified: Secondary | ICD-10-CM

## 2010-09-11 ENCOUNTER — Ambulatory Visit (INDEPENDENT_AMBULATORY_CARE_PROVIDER_SITE_OTHER): Payer: Medicare Other

## 2010-09-11 DIAGNOSIS — J309 Allergic rhinitis, unspecified: Secondary | ICD-10-CM

## 2010-09-17 ENCOUNTER — Ambulatory Visit (INDEPENDENT_AMBULATORY_CARE_PROVIDER_SITE_OTHER): Payer: Medicare Other

## 2010-09-17 DIAGNOSIS — J309 Allergic rhinitis, unspecified: Secondary | ICD-10-CM

## 2010-09-23 ENCOUNTER — Ambulatory Visit (INDEPENDENT_AMBULATORY_CARE_PROVIDER_SITE_OTHER): Payer: Medicare Other

## 2010-09-23 DIAGNOSIS — J309 Allergic rhinitis, unspecified: Secondary | ICD-10-CM

## 2010-10-01 ENCOUNTER — Ambulatory Visit (INDEPENDENT_AMBULATORY_CARE_PROVIDER_SITE_OTHER): Payer: Medicare Other

## 2010-10-01 DIAGNOSIS — J309 Allergic rhinitis, unspecified: Secondary | ICD-10-CM

## 2010-10-02 ENCOUNTER — Encounter: Payer: Self-pay | Admitting: Internal Medicine

## 2010-10-03 DIAGNOSIS — D49519 Neoplasm of unspecified behavior of unspecified kidney: Secondary | ICD-10-CM

## 2010-10-03 HISTORY — DX: Neoplasm of unspecified behavior of unspecified kidney: D49.519

## 2010-10-06 ENCOUNTER — Ambulatory Visit (INDEPENDENT_AMBULATORY_CARE_PROVIDER_SITE_OTHER): Payer: Medicare Other

## 2010-10-06 DIAGNOSIS — J309 Allergic rhinitis, unspecified: Secondary | ICD-10-CM

## 2010-10-08 ENCOUNTER — Ambulatory Visit (INDEPENDENT_AMBULATORY_CARE_PROVIDER_SITE_OTHER): Payer: Medicare Other

## 2010-10-08 DIAGNOSIS — J309 Allergic rhinitis, unspecified: Secondary | ICD-10-CM

## 2010-10-15 ENCOUNTER — Ambulatory Visit (INDEPENDENT_AMBULATORY_CARE_PROVIDER_SITE_OTHER): Payer: Medicare Other

## 2010-10-15 DIAGNOSIS — J309 Allergic rhinitis, unspecified: Secondary | ICD-10-CM

## 2010-10-16 ENCOUNTER — Other Ambulatory Visit: Payer: Self-pay | Admitting: Gastroenterology

## 2010-10-16 DIAGNOSIS — R109 Unspecified abdominal pain: Secondary | ICD-10-CM

## 2010-10-21 ENCOUNTER — Ambulatory Visit
Admission: RE | Admit: 2010-10-21 | Discharge: 2010-10-21 | Disposition: A | Discharge status: Home / Self Care | Payer: Medicare Other | Source: Ambulatory Visit | Attending: Gastroenterology | Admitting: Gastroenterology

## 2010-10-21 ENCOUNTER — Ambulatory Visit (INDEPENDENT_AMBULATORY_CARE_PROVIDER_SITE_OTHER): Payer: Medicare Other

## 2010-10-21 DIAGNOSIS — J309 Allergic rhinitis, unspecified: Secondary | ICD-10-CM

## 2010-10-21 DIAGNOSIS — R109 Unspecified abdominal pain: Secondary | ICD-10-CM

## 2010-10-21 MED ORDER — IOHEXOL 300 MG/ML  SOLN
100.0000 mL | Freq: Once | INTRAMUSCULAR | Status: AC | PRN
  Administered 2010-10-21: 100 mL via INTRAVENOUS

## 2010-10-27 ENCOUNTER — Ambulatory Visit (INDEPENDENT_AMBULATORY_CARE_PROVIDER_SITE_OTHER): Payer: Medicare Other

## 2010-10-27 DIAGNOSIS — J309 Allergic rhinitis, unspecified: Secondary | ICD-10-CM

## 2010-11-06 ENCOUNTER — Other Ambulatory Visit (HOSPITAL_COMMUNITY): Payer: Self-pay | Admitting: Internal Medicine

## 2010-11-06 ENCOUNTER — Ambulatory Visit (INDEPENDENT_AMBULATORY_CARE_PROVIDER_SITE_OTHER): Payer: Medicare Other

## 2010-11-06 ENCOUNTER — Ambulatory Visit (INDEPENDENT_AMBULATORY_CARE_PROVIDER_SITE_OTHER): Payer: Medicare Other | Admitting: Internal Medicine

## 2010-11-06 ENCOUNTER — Encounter: Payer: Self-pay | Admitting: Internal Medicine

## 2010-11-06 VITALS — BP 120/80 | HR 68 | Ht 63.0 in | Wt 159.2 lb

## 2010-11-06 DIAGNOSIS — R059 Cough, unspecified: Secondary | ICD-10-CM

## 2010-11-06 DIAGNOSIS — R05 Cough: Secondary | ICD-10-CM

## 2010-11-06 DIAGNOSIS — J301 Allergic rhinitis due to pollen: Secondary | ICD-10-CM

## 2010-11-06 DIAGNOSIS — J309 Allergic rhinitis, unspecified: Secondary | ICD-10-CM

## 2010-11-06 DIAGNOSIS — Z23 Encounter for immunization: Secondary | ICD-10-CM

## 2010-11-06 LAB — COMPREHENSIVE METABOLIC PANEL
ALT: 17 U/L (ref 0–35)
AST: 28 U/L (ref 0–37)
Albumin: 3.9 g/dL (ref 3.5–5.2)
Alkaline Phosphatase: 35 U/L — ABNORMAL LOW (ref 39–117)
BUN: 16 mg/dL (ref 6–23)
CO2: 27 mEq/L (ref 19–32)
Calcium: 9 mg/dL (ref 8.4–10.5)
Chloride: 102 mEq/L (ref 96–112)
Creatinine, Ser: 1.01 mg/dL (ref 0.4–1.2)
GFR calc Af Amer: >60 mL/min (ref >60)
GFR calc non Af Amer: 53 mL/min — ABNORMAL LOW (ref >60)
Glucose, Bld: 103 mg/dL — ABNORMAL HIGH (ref 70–99)
Potassium: 3.7 mEq/L (ref 3.5–5.1)
Sodium: 138 mEq/L (ref 135–145)
Total Bilirubin: 0.9 mg/dL (ref 0.3–1.2)
Total Protein: 6.7 g/dL (ref 6.0–8.3)

## 2010-11-06 LAB — BLOOD GAS, ARTERIAL
Acid-base deficit: 2.8 mmol/L — ABNORMAL HIGH (ref 0.0–2.0)
Bicarbonate: 20.6 mEq/L (ref 20.0–24.0)
FIO2: 0.4 %
MECHVT: 0.6 mL
O2 Saturation: 99.7 %
PEEP: 0.5 cmH2O
Patient temperature: 98.6
RATE: 12 resp/min
TCO2: 21.6 mmol/L (ref 0–100)
pCO2 arterial: 30.5 mmHg — ABNORMAL LOW (ref 35.0–45.0)
pH, Arterial: 7.445 — ABNORMAL HIGH (ref 7.350–7.400)
pO2, Arterial: 151 mmHg — ABNORMAL HIGH (ref 80.0–100.0)

## 2010-11-06 LAB — CK TOTAL AND CKMB (NOT AT ARMC)
CK, MB: 1.3 ng/mL (ref 0.3–4.0)
Relative Index: 1.2 (ref 0.0–2.5)
Total CK: 110 U/L (ref 7–177)

## 2010-11-06 LAB — CBC
HCT: 34.9 % — ABNORMAL LOW (ref 36.0–46.0)
Hemoglobin: 11.9 g/dL — ABNORMAL LOW (ref 12.0–15.0)
MCHC: 34 g/dL (ref 30.0–36.0)
MCV: 88.6 fL (ref 78.0–100.0)
Platelets: 146 10*3/uL — ABNORMAL LOW (ref 150–400)
RBC: 3.94 MIL/uL (ref 3.87–5.11)
RDW: 13.1 % (ref 11.5–15.5)
WBC: 7.4 10*3/uL (ref 4.0–10.5)

## 2010-11-06 LAB — DIFFERENTIAL
Basophils Absolute: 0 10*3/uL (ref 0.0–0.1)
Basophils Relative: 0 % (ref 0–1)
Eosinophils Absolute: 0.1 10*3/uL (ref 0.0–0.7)
Eosinophils Relative: 2 % (ref 0–5)
Lymphocytes Relative: 28 % (ref 12–46)
Lymphs Abs: 2 10*3/uL (ref 0.7–4.0)
Monocytes Absolute: 0.6 10*3/uL (ref 0.1–1.0)
Monocytes Relative: 9 % (ref 3–12)
Neutro Abs: 4.6 10*3/uL (ref 1.7–7.7)
Neutrophils Relative %: 62 % (ref 43–77)

## 2010-11-06 LAB — PROTIME-INR
INR: 1 (ref 0.00–1.49)
Prothrombin Time: 13.3 seconds (ref 11.6–15.2)

## 2010-11-06 LAB — TROPONIN I: Troponin I: <0.01 ng/mL (ref 0.00–0.06)

## 2010-11-06 LAB — APTT: aPTT: 27 seconds (ref 24–37)

## 2010-11-06 NOTE — Patient Instructions (Addendum)
Order- refer for speech Therapy assisted, Modified Barium Swallow for dx cough  When you see Dr Lavell Luster and Dr Thad Ranger, please let them know we suspect propranalol might contribute to your chronic cough and ask if they could try an alternative .

## 2010-11-06 NOTE — Progress Notes (Signed)
11/06/10- 77 year old female never smoker followed for allergic rhinosinusitis, chronic cough, complicated by CVA, GERD, pacemaker, HBP.Marland Kitchen daughter here Last here -11/07/2009 She got flu vaccine. Continues allergy vaccine at 1-10 without problem. Cough has persisted over the last few months. It occasionally wakes her but she takes a little Tussionex each night to permit the per sleep. Occasional antihistamine. They recognize no relationship to whether or external factors. She may choke a little sometimes while swallowing but it is not much. Breathing is otherwise comfortable. She had a swallowing evaluation years ago. Does not recognize reflux or postnasal drip or wheeze. Since last here had pacemaker placed.  ROS Per HPI Constitutional:   No-   weight loss, night sweats, fevers, chills, fatigue, lassitude. HEENT:   No-  headaches, difficulty swallowing, tooth/dental problems, sore throat,       No-  sneezing, itching, ear ache, nasal congestion, post nasal drip,  CV:  No-   chest pain, orthopnea, PND, swelling in lower extremities, anasarca, dizziness, palpitations Resp: No-   shortness of breath with exertion or at rest.              No-   productive cough,  + non-productive cough,  No-  coughing up of blood.              No-   change in color of mucus.  No- wheezing.   Skin: No-   rash or lesions. GI:  No-   heartburn, indigestion, abdominal pain, nausea, vomiting, diarrhea,                 change in bowel habits, loss of appetite GU: No-   dysuria, change in color of urine, no urgency or frequency.  No- flank pain. MS:  No-   joint pain or swelling.  No- decreased range of motion.  No- back pain. Neuro- grossly normal to observation, Or:  Psych:  No- change in mood or affect. No depression or anxiety.  No memory loss.   OBJ General- Alert, Oriented, Affect-appropriate, Distress- none acute Skin- rash-none, lesions- none, excoriation- none Lymphadenopathy- none Head- atraumatic   Eyes- Gross vision intact, PERRLA, conjunctivae clear secretions            Ears- Hearing, canals-normal            Nose- Clear, no-Septal dev, mucus, polyps, erosion, perforation             Throat- Mallampati III , mucosa clear , drainage- none, tonsils- atrophic. Hesitant speech without stridor Neck- flexible , trachea midline, no stridor , thyroid nl, carotid no bruit Chest - symmetrical excursion , unlabored           Heart/CV- RRR , no murmur , no gallop  , no rub, nl s1 s2                           - JVD- none , edema- none, stasis changes- none, varices- none           Lung- clear to P&A, wheeze- none, cough- none , dullness-none, rub- none           Chest wall- pacemaker on right Abd- tender-no, distended-no, bowel sounds-present, HSM- no Br/ Gen/ Rectal- Not done, not indicated Extrem- cyanosis- none, clubbing, none, atrophy- none, strength- nl Neuro- grossly intact to observation

## 2010-11-08 NOTE — Assessment & Plan Note (Signed)
She has felt that allergy vaccine contributed significantly to cough control so we will continue.

## 2010-11-08 NOTE — Assessment & Plan Note (Signed)
Multifactorial cough, possibly cyclic. I am very suspicious that her history of CVA may contribute. We will update a speech therapy assisted modified barium swallow.

## 2010-11-09 ENCOUNTER — Inpatient Hospital Stay (HOSPITAL_COMMUNITY): Admission: RE | Admit: 2010-11-09 | Payer: Medicare Other | Source: Ambulatory Visit

## 2010-11-09 ENCOUNTER — Ambulatory Visit (HOSPITAL_COMMUNITY)
Admission: RE | Admit: 2010-11-09 | Discharge: 2010-11-09 | Disposition: A | Discharge status: Home / Self Care | Payer: Medicare Other | Source: Ambulatory Visit | Attending: Internal Medicine | Admitting: Internal Medicine

## 2010-11-09 ENCOUNTER — Ambulatory Visit (HOSPITAL_COMMUNITY): Payer: Medicare Other

## 2010-11-09 DIAGNOSIS — R05 Cough: Secondary | ICD-10-CM

## 2010-11-09 DIAGNOSIS — R059 Cough, unspecified: Secondary | ICD-10-CM | POA: Insufficient documentation

## 2010-11-12 ENCOUNTER — Telehealth: Payer: Self-pay | Admitting: Internal Medicine

## 2010-11-12 NOTE — Telephone Encounter (Signed)
I spoke with daughter(also a patient of CY) and she is aware that results are not back at this time but we will call her as soon as we get them,

## 2010-11-13 ENCOUNTER — Ambulatory Visit (INDEPENDENT_AMBULATORY_CARE_PROVIDER_SITE_OTHER): Payer: Medicare Other

## 2010-11-13 DIAGNOSIS — J309 Allergic rhinitis, unspecified: Secondary | ICD-10-CM

## 2010-11-18 ENCOUNTER — Ambulatory Visit (INDEPENDENT_AMBULATORY_CARE_PROVIDER_SITE_OTHER): Payer: Medicare Other

## 2010-11-18 ENCOUNTER — Telehealth: Payer: Self-pay | Admitting: Internal Medicine

## 2010-11-18 ENCOUNTER — Encounter: Payer: Self-pay | Admitting: Internal Medicine

## 2010-11-18 DIAGNOSIS — J309 Allergic rhinitis, unspecified: Secondary | ICD-10-CM

## 2010-11-18 NOTE — Telephone Encounter (Signed)
CY, the results are scanned in under image in EPIC; please advise of results so I may go over them with Cindy-pt's daughter. Thanks.

## 2010-11-18 NOTE — Telephone Encounter (Signed)
Spoke with pt's daughter Arline Asp. She states that she is needing results of swallow study done 11/09/10. I do not see results in Epic yet. Please advise, thanks!

## 2010-11-18 NOTE — Telephone Encounter (Signed)
I saw the Speech Therapist report for the [facility] and sent it to be scanned.

## 2010-11-22 NOTE — Telephone Encounter (Signed)
Modified barium swallow-the speech therapist noted a very strong cough associated with incomplete emptying of the esophagus. She swallowed normally and there was no significant reflux or aspiration. She failed to completely empty the lower third of her esophagus and the therapist associated this observation with the coughing- The therapist suggested getting a standard barium swallow or having upper endoscopy done, but these have been done. The best approach may be to make appointment swallowing something solid, like a bite of bread, intermittently throughout the meal, to "sweep" the esophagus.

## 2010-11-23 ENCOUNTER — Encounter: Payer: Self-pay | Admitting: *Deleted

## 2010-11-23 NOTE — Telephone Encounter (Signed)
Per Florentina Addison, the last sentence CDY wrote should say The best approach may be to make a POINT TO, rather than make an appointment.  I called and spoke with pt's daughter and notified her of these results and she verbalized understanding.

## 2010-11-23 NOTE — Telephone Encounter (Signed)
Will forward to Katie per her request to clarify results

## 2010-11-27 ENCOUNTER — Ambulatory Visit (INDEPENDENT_AMBULATORY_CARE_PROVIDER_SITE_OTHER): Payer: Medicare Other

## 2010-11-27 DIAGNOSIS — J309 Allergic rhinitis, unspecified: Secondary | ICD-10-CM

## 2010-12-03 DIAGNOSIS — C801 Malignant (primary) neoplasm, unspecified: Secondary | ICD-10-CM

## 2010-12-03 HISTORY — DX: Malignant (primary) neoplasm, unspecified: C80.1

## 2010-12-04 ENCOUNTER — Ambulatory Visit (INDEPENDENT_AMBULATORY_CARE_PROVIDER_SITE_OTHER): Payer: Medicare Other

## 2010-12-04 DIAGNOSIS — J309 Allergic rhinitis, unspecified: Secondary | ICD-10-CM

## 2010-12-07 ENCOUNTER — Telehealth: Payer: Self-pay | Admitting: Internal Medicine

## 2010-12-07 NOTE — Telephone Encounter (Signed)
Called and spoke with pt's daughter, [name], and informed her results faxed to # above that she provided [place] with.  Nothing further needed.

## 2010-12-10 ENCOUNTER — Ambulatory Visit (INDEPENDENT_AMBULATORY_CARE_PROVIDER_SITE_OTHER): Payer: Medicare Other

## 2010-12-10 DIAGNOSIS — J309 Allergic rhinitis, unspecified: Secondary | ICD-10-CM

## 2010-12-18 ENCOUNTER — Ambulatory Visit (INDEPENDENT_AMBULATORY_CARE_PROVIDER_SITE_OTHER): Payer: Medicare Other

## 2010-12-18 DIAGNOSIS — J309 Allergic rhinitis, unspecified: Secondary | ICD-10-CM

## 2010-12-21 ENCOUNTER — Other Ambulatory Visit: Payer: Self-pay | Admitting: Radiology

## 2010-12-25 ENCOUNTER — Telehealth: Payer: Self-pay | Admitting: Oncology

## 2010-12-28 ENCOUNTER — Encounter: Payer: Self-pay | Admitting: Internal Medicine

## 2010-12-28 ENCOUNTER — Other Ambulatory Visit: Payer: Self-pay | Admitting: *Deleted

## 2010-12-28 ENCOUNTER — Ambulatory Visit (INDEPENDENT_AMBULATORY_CARE_PROVIDER_SITE_OTHER): Payer: Medicare Other

## 2010-12-28 DIAGNOSIS — C50919 Malignant neoplasm of unspecified site of unspecified female breast: Secondary | ICD-10-CM

## 2010-12-28 DIAGNOSIS — J309 Allergic rhinitis, unspecified: Secondary | ICD-10-CM

## 2010-12-28 NOTE — Progress Notes (Signed)
Kaylyn Lim, RN confirmed pt appt for 12/30/10 at 1215 on 12/26/10.

## 2010-12-30 ENCOUNTER — Ambulatory Visit: Payer: Medicare Other

## 2010-12-30 ENCOUNTER — Encounter: Payer: Self-pay | Admitting: Oncology

## 2010-12-30 ENCOUNTER — Ambulatory Visit (HOSPITAL_BASED_OUTPATIENT_CLINIC_OR_DEPARTMENT_OTHER): Payer: Medicare Other | Admitting: General Surgery

## 2010-12-30 ENCOUNTER — Ambulatory Visit
Admission: RE | Admit: 2010-12-30 | Discharge: 2010-12-30 | Disposition: A | Discharge status: Home / Self Care | Payer: Medicare Other | Source: Ambulatory Visit | Attending: Radiation Oncology | Admitting: Radiation Oncology

## 2010-12-30 ENCOUNTER — Encounter (INDEPENDENT_AMBULATORY_CARE_PROVIDER_SITE_OTHER): Payer: Self-pay | Admitting: General Surgery

## 2010-12-30 ENCOUNTER — Telehealth: Payer: Self-pay | Admitting: *Deleted

## 2010-12-30 ENCOUNTER — Ambulatory Visit (HOSPITAL_BASED_OUTPATIENT_CLINIC_OR_DEPARTMENT_OTHER): Payer: Medicare Other | Admitting: Oncology

## 2010-12-30 ENCOUNTER — Ambulatory Visit: Payer: Medicare Other | Attending: General Surgery | Admitting: Physical Therapy

## 2010-12-30 ENCOUNTER — Other Ambulatory Visit (HOSPITAL_BASED_OUTPATIENT_CLINIC_OR_DEPARTMENT_OTHER): Payer: Medicare Other | Admitting: Lab

## 2010-12-30 VITALS — BP 107/69 | HR 76 | Temp 97.8°F | Ht 62.5 in | Wt 157.4 lb

## 2010-12-30 VITALS — BP 107/69 | HR 76 | Temp 97.8°F | Resp 20 | Ht 62.5 in | Wt 157.4 lb

## 2010-12-30 DIAGNOSIS — C50219 Malignant neoplasm of upper-inner quadrant of unspecified female breast: Secondary | ICD-10-CM

## 2010-12-30 DIAGNOSIS — Z170 Estrogen receptor positive status [ER+]: Secondary | ICD-10-CM

## 2010-12-30 DIAGNOSIS — C50919 Malignant neoplasm of unspecified site of unspecified female breast: Secondary | ICD-10-CM

## 2010-12-30 DIAGNOSIS — C50211 Malignant neoplasm of upper-inner quadrant of right female breast: Secondary | ICD-10-CM | POA: Insufficient documentation

## 2010-12-30 DIAGNOSIS — IMO0001 Reserved for inherently not codable concepts without codable children: Secondary | ICD-10-CM | POA: Insufficient documentation

## 2010-12-30 LAB — CBC WITH DIFFERENTIAL/PLATELET
BASO%: 0.4 % (ref 0.0–2.0)
Basophils Absolute: 0 10*3/uL (ref 0.0–0.1)
EOS%: 2.2 % (ref 0.0–7.0)
Eosinophils Absolute: 0.2 10*3/uL (ref 0.0–0.5)
HCT: 39.7 % (ref 34.8–46.6)
HGB: 13.4 g/dL (ref 11.6–15.9)
LYMPH%: 26 % (ref 14.0–49.7)
MCH: 29.4 pg (ref 25.1–34.0)
MCHC: 33.7 g/dL (ref 31.5–36.0)
MCV: 87 fL (ref 79.5–101.0)
MONO#: 0.7 10*3/uL (ref 0.1–0.9)
MONO%: 8 % (ref 0.0–14.0)
NEUT#: 5.2 10*3/uL (ref 1.5–6.5)
NEUT%: 63.4 % (ref 38.4–76.8)
Platelets: 180 10*3/uL (ref 145–400)
RBC: 4.56 10*6/uL (ref 3.70–5.45)
RDW: 13.6 % (ref 11.2–14.5)
WBC: 8.1 10*3/uL (ref 3.9–10.3)
lymph#: 2.1 10*3/uL (ref 0.9–3.3)

## 2010-12-30 LAB — COMPREHENSIVE METABOLIC PANEL
ALT: 19 U/L (ref 0–35)
AST: 24 U/L (ref 0–37)
Albumin: 4.1 g/dL (ref 3.5–5.2)
Alkaline Phosphatase: 40 U/L (ref 39–117)
BUN: 23 mg/dL (ref 6–23)
CO2: 26 mEq/L (ref 19–32)
Calcium: 10.2 mg/dL (ref 8.4–10.5)
Chloride: 101 mEq/L (ref 96–112)
Creatinine, Ser: 0.91 mg/dL (ref 0.50–1.10)
Glucose, Bld: 116 mg/dL — ABNORMAL HIGH (ref 70–99)
Potassium: 3.9 mEq/L (ref 3.5–5.3)
Sodium: 139 mEq/L (ref 135–145)
Total Bilirubin: 0.4 mg/dL (ref 0.3–1.2)
Total Protein: 7.5 g/dL (ref 6.0–8.3)

## 2010-12-30 LAB — CANCER ANTIGEN 27.29: CA 27.29: 15 U/mL (ref 0–39)

## 2010-12-30 NOTE — Progress Notes (Signed)
Patient ID: Debra Lynn, female   DOB: Apr 05, 1928, 77 y.o.   MRN: [id number]  Chief Complaint  Patient presents with  . Other    breast cancer    HPI Debra Lynn is a 77 y.o. female.  Referred by Dr. Jeralyn Ruths HPI This is an 77 year old female with multiple medical comorbidities who presents with a newly diagnosed right breast cancer. She does not really do her own exams. This was found on a screening mammogram recently which showed a 1.8 cm right upper inner quadrant mass. On ultrasound this appeared to be about 2.1 cm. On BSGI this was 2.3 cm in size. She underwent a biopsy that was ultrasound-guided with clip placement. 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 This is estrogen receptor positive at 90%, progesterone receptor is 0%, proliferation index is 25% and HER-2/neu is not amplified. She recognizes that this area is present now. She has no other symptoms referable to her breasts including any other masses, discharge, or tenderness.  She does have a significant history of being on warfarin as well as a prior stroke while she was not on warfarin that was thought to be related to her atrial fibrillation.  Past Medical History  Diagnosis Date  . Sick sinus syndrome     with pacemaker and ablation  . Stroke 2010  . Glaucoma   . Seasonal allergic rhinitis     Pos Skin Test 07-05-07  . GERD (gastroesophageal reflux disease)   . Atrial fibrillation   . Hypertension   . Hyperlipidemia     Past Surgical History  Procedure Date  . Cholecystectomy   . Vesicovaginal fistula closure w/ tah   . Atrial ablation surgery   . Flexible bronchoscopy w/ upper endoscopy     and swallowing evaluation  . Pacemaker insertion   . Knee arthroscopy   . Cataract extraction     Family History  Problem Relation Age of Onset  . Asthma Brother   . Asthma Daughter   . Heart disease    . Cancer    . Heart failure Mother   . Cancer Father   .  Lymphoma Brother   . Diabetes Brother   . Hypertension Brother     Social History History  Substance Use Topics  . Smoking status: Never Smoker   . Smokeless tobacco: Not on file  . Alcohol Use: No    No Known Allergies  Current Outpatient Prescriptions  Medication Sig Dispense Refill  . acetaminophen (TYLENOL) 500 MG tablet Take 500 mg by mouth every 6 (six) hours as needed.        Marland Kitchen amLODipine (NORVASC) 2.5 MG tablet Take 1 tablet by mouth daily.      . brimonidine (ALPHAGAN) 0.15 % ophthalmic solution       . Calcium Carbonate-Vitamin D (CALCIUM-VITAMIN D) 500-200 MG-UNIT per tablet Take 1 tablet by mouth daily.        . cetirizine (ZYRTEC) 10 MG tablet Take 10 mg by mouth daily as needed.        Marland Kitchen losartan-hydrochlorothiazide (HYZAAR) 100-25 MG per tablet Take 1 tablet by mouth daily.      Marland Kitchen MICRONIZED COLESTIPOL HCL 1 G tablet Take 1 tablet by mouth daily.      . Multiple Vitamins-Minerals (CENTRUM SILVER) tablet Take 1 tablet by mouth daily.        . Omega-3 Fatty Acids (FISH OIL) 1000 MG CAPS Take 1 capsule by mouth 2 (  two) times daily.        . pantoprazole (PROTONIX) 40 MG tablet Take 1 tablet by mouth daily.      . potassium chloride SA (K-DUR,KLOR-CON) 20 MEQ tablet Take 1 tablet by mouth daily.      . pravastatin (PRAVACHOL) 40 MG tablet Take 1 tablet by mouth daily.      . propranolol (INDERAL LA) 60 MG 24 hr capsule Take 1 tablet by mouth Twice daily.      . RESTASIS 0.05 % ophthalmic emulsion       . warfarin (COUMADIN) 2.5 MG tablet Take as directed         Review of Systems Review of Systems  Constitutional: Positive for fatigue. Negative for fever, chills and unexpected weight change.  HENT: Positive for sinus pressure. Negative for hearing loss, congestion, sore throat, trouble swallowing and voice change.   Eyes: Negative for visual disturbance.  Respiratory: Positive for cough (nonproductive). Negative for wheezing.   Cardiovascular: Negative for chest  pain, palpitations and leg swelling.  Gastrointestinal: Negative for nausea, vomiting, abdominal pain, diarrhea, constipation, blood in stool, abdominal distention and anal bleeding.  Genitourinary: Negative for hematuria, vaginal bleeding and difficulty urinating.  Musculoskeletal: Negative for arthralgias.  Skin: Negative for rash and wound.  Neurological: Positive for tremors. Negative for seizures, syncope and headaches.  Hematological: Negative for adenopathy. Does not bruise/bleed easily.  Psychiatric/Behavioral: Negative for confusion.    There were no vitals taken for this visit.  Physical Exam Physical Exam  Constitutional: She appears well-developed and well-nourished. No distress.  Eyes: No scleral icterus.  Neck: Neck supple.  Cardiovascular: Normal rate, regular rhythm and normal heart sounds.   Pulmonary/Chest: Effort normal and breath sounds normal. She has no wheezes. She has no rales. Right breast exhibits no inverted nipple, no nipple discharge, no skin change and no tenderness. Left breast exhibits no inverted nipple, no mass, no nipple discharge, no skin change and no tenderness. Breasts are symmetrical.    Abdominal: Soft. There is no hepatomegaly.  Lymphadenopathy:    She has no cervical adenopathy.    She has no axillary adenopathy.       Right: No supraclavicular adenopathy present.       Left: No supraclavicular adenopathy present.    Data Reviewed MMG and u/s reviewed, BSGI reviewed at conference  Assessment    T2 Right breast cancer    Plan     We discussed the staging and pathophysiology of breast cancer. We discussed all of the different options for treatment for breast cancer including surgery, chemotherapy, radiation therapy, Herceptin, and antiestrogen therapy.   We discussed a sentinel lymph node biopsy as she does not appear to having lymph node involvement right now. We discussed the performance of that with injection of radioactive tracer  and blue dye.  I do not think a sentinel node biopsy is indicated in her as we have agreed at multidisciplinary clinic due to the fact it will not change her treatment and her nodes are clinically negative at this time. We discussed the options for treatment of the breast cancer which included lumpectomy versus a mastectomy. We discussed the performance of the lumpectomy with a wire placement. We discussed a 10-20% chance of a positive margin requiring reexcision in the operating room. We also discussed that she may need radiation therapy or antiestrogen therapy or both if she undergoes lumpectomy. We discussed mastectomy and the postoperative care for that as well. We discussed that there is no  difference in her survival whether she undergoes lumpectomy with radiation therapy or antiestrogen therapy versus a mastectomy.  I think the best plan for her given a fairly large mass and a small breasts and a lady with multiple medical comorbidities would be to proceed with a simple mastectomy on this side. This will minimize her amount of trips to the operating room as well as it may minimize the therapies for also. I think that a lumpectomy will create a fairly large cosmetic defect with the possibility of needing to return to the operating room as well. She and her family are all in agreement that minimize any time in the operating room as well as treatment and maintaining her quality of life are all the strategies if they would like to pursue as well. We discussed the risks of operation including bleeding, infection, possible reoperation. She understands her further therapy will be based on what her stages at the time of her operation.we discussed also the medical risks including leading due to the fact that we'll need to start her anticoagulation postoperatively, stroke, arrhythmia, and myocardial infarction. I'm going to obtain perioperative evaluation for both her cardiologist as well as neurology and then I will  plan on proceeding with scheduling her after that.         Kennedy Brines 12/30/2010, 4:09 PM

## 2010-12-30 NOTE — Progress Notes (Signed)
CC: Debra Lynn  is an 77 year old [place] woman referred by Debra Lynn for evaluation and treatment in the setting of newly diagnosed breast cancer.  HPI: The patient had routine screening mammography 12/15/2010 at [facility]. She has heterogeneously dense breast tissue. There was a suggestion of a new mass in the upper inner quadrant of the right breast and she was brought back November 16 for right diagnostic mammography and ultrasonography. Spot compression views confirmed a 1.8 cm multilobulated mass which by ultrasound measured 2.1 cm, was ill-defined and hypoechoic. Biopsies November 19 ([id number]) showed an invasive ductal carcinoma, grade 2, strongly e-cadherin positive, estrogen receptor 90% positive, progesterone receptor negative, with no HER to amplification by CISH. The MIB-1 was 25%.  The patient is not a candidate for MRI because she has a pacemaker in place. Accordingly she underwent BSGI earlier this month, showing a solitary focus of increased uptake, measuring 2.3 cm.  With this information the patient presents to the multidisciplinary breast cancer clinic for definitive treatment plan.    Past Medical History  Diagnosis Date  . Sick sinus syndrome     with pacemaker and ablation  . Stroke 2010  . Glaucoma   . Seasonal allergic rhinitis     Pos Skin Test 07-05-07  . GERD (gastroesophageal reflux disease)   . Atrial fibrillation   . Hypertension   . Hyperlipidemia    Of note, the patient was not on warfarin or aspirin when she had her CVA.  Past Surgical History  Procedure Date  . Cholecystectomy   . Vesicovaginal fistula closure w/ tah   . Atrial ablation surgery   . Flexible bronchoscopy w/ upper endoscopy     and swallowing evaluation  . Pacemaker insertion   . Knee arthroscopy   . Cataract extraction     Family History  Problem Relation Age of Onset  . Asthma Brother   . Asthma Daughter   . Heart disease    . Cancer    . Heart failure  Mother   . Cancer Father   . Lymphoma Brother   . Diabetes Brother   . Hypertension Brother     The patient's father died at the age of 87 from central nervous system cancer. The patient's mother died at the age of 60. The patient had one brother who had a history of lymphoma, but survives.  Gynecologic history: GX P2. 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 Her husband Debra Lynn presents today I used to work for [employer]. Daughter Debra Lynn, present today, lives in [place] and is a retired Scientist, research (life sciences). Debra Lynn is a widow of my former patient "Debra Lynn" Debra Lynn.) Daughter Debra Lynn lives in [place] where she works as a Human resources officer. The patient has 2 grandsons. She is not a Advice worker. The patient does have advanced directives in place.  Health maintenance:  The patient  reports that she has never smoked. She does not have any smokeless tobacco history on file. She reports that she does not drink alcohol. Her drug history not on file.   Cholesterol --  Bone density at SER > 2 yrs ago  Colonoscopy -does not recall date   (PAP) --  Allergies: No Known Allergies     Current Outpatient Prescriptions  Medication Sig Dispense Refill  . acetaminophen (TYLENOL) 500 MG tablet  Take 500 mg by mouth every 6 (six) hours as needed.        Marland Kitchen amLODipine (NORVASC) 2.5 MG tablet Take 1 tablet by mouth daily.      . brimonidine (ALPHAGAN) 0.15 % ophthalmic solution       . Calcium Carbonate-Vitamin D (CALCIUM-VITAMIN D) 500-200 MG-UNIT per tablet Take 1 tablet by mouth daily.        . cetirizine (ZYRTEC) 10 MG tablet Take 10 mg by mouth daily as needed.        Marland Kitchen losartan-hydrochlorothiazide (HYZAAR) 100-25 MG per tablet Take 1 tablet by mouth daily.      Marland Kitchen MICRONIZED COLESTIPOL HCL 1 G tablet Take 1 tablet by mouth  daily.      . Multiple Vitamins-Minerals (CENTRUM SILVER) tablet Take 1 tablet by mouth daily.        . Omega-3 Fatty Acids (FISH OIL) 1000 MG CAPS Take 1 capsule by mouth 2 (two) times daily.        . pantoprazole (PROTONIX) 40 MG tablet Take 1 tablet by mouth daily.      . potassium chloride SA (K-DUR,KLOR-CON) 20 MEQ tablet Take 1 tablet by mouth daily.      . pravastatin (PRAVACHOL) 40 MG tablet Take 1 tablet by mouth daily.      . propranolol (INDERAL LA) 60 MG 24 hr capsule Take 1 tablet by mouth Twice daily.      Marland Kitchen warfarin (COUMADIN) 2.5 MG tablet Take as directed       . RESTASIS 0.05 % ophthalmic emulsion         ROS she reports no systemic symptoms that might suggest disseminated disease. She feels chronically fatigued, has glaucoma and her vision has been affected, has a hearing aid, has frequent sinus problems, of course she has a history of atrial fibrillation and is on her third pacemaker. She has a little bit of a dry cough at times. She has stress urinary incontinence. She bruises easily (on warfarin), has neuropathy involving particularly the feet, which makes walking difficult, but there have not been recent falls. She has arthritis "all over". She feels forgetful but not depressed. She has a resting tremor.  Physical Exam:  Blood pressure 107/69, pulse 76, temperature 97.8 F (36.6 C), height 5' 2.5" (1.588 m), weight 157 lb 6.4 oz (71.396 kg).  Sclerae unicteric Oropharynx clear No peripheral adenopathy and particularly the right axilla is unremarkable Lungs no rales or rhonchi Heart regular rate and rhythm Abd benign MSK no focal spinal tenderness, no peripheral edema Neuro: nonfocal Breasts: Right breast status post recent biopsy. There are no significant skin changes or nipple retraction. I cannot palpate a clearly defined mass. The left breast shows no suspicious findings.  LABS   Results for orders placed in visit on 12/30/10 (from the past 48 hour(s))  CBC  WITH DIFFERENTIAL     Status: Normal   Collection Time   12/30/10 12:24 PM      Component Value Range Comment   WBC 8.1  3.9 - 10.3 (10e3/uL)    NEUT# 5.2  1.5 - 6.5 (10e3/uL)    HGB 13.4  11.6 - 15.9 (g/dL)    HCT 16.1  09.6 - 04.5 (%)    Platelets 180  145 - 400 (10e3/uL)    MCV 87.0  79.5 - 101.0 (fL)    MCH 29.4  25.1 - 34.0 (pg)    MCHC 33.7  31.5 - 36.0 (g/dL)    RBC 4.09  3.70 - 5.45 (10e6/uL)    RDW 13.6  11.2 - 14.5 (%)    lymph# 2.1  0.9 - 3.3 (10e3/uL)    MONO# 0.7  0.1 - 0.9 (10e3/uL)    Eosinophils Absolute 0.2  0.0 - 0.5 (10e3/uL)    Basophils Absolute 0.0  0.0 - 0.1 (10e3/uL)    NEUT% 63.4  38.4 - 76.8 (%)    LYMPH% 26.0  14.0 - 49.7 (%)    MONO% 8.0  0.0 - 14.0 (%)    EOS% 2.2  0.0 - 7.0 (%)    BASO% 0.4  0.0 - 2.0 (%)   COMPREHENSIVE METABOLIC PANEL     Status: Abnormal   Collection Time   12/30/10 12:24 PM      Component Value Range Comment   Sodium 139  135 - 145 (mEq/L)    Potassium 3.9  3.5 - 5.3 (mEq/L)    Chloride 101  96 - 112 (mEq/L)    CO2 26  19 - 32 (mEq/L)    Glucose, Bld 116 (*) 70 - 99 (mg/dL)    BUN 23  6 - 23 (mg/dL)    Creatinine, Ser 0.98  0.50 - 1.10 (mg/dL)    Total Bilirubin 0.4  0.3 - 1.2 (mg/dL)    Alkaline Phosphatase 40  39 - 117 (U/L)    AST 24  0 - 37 (U/L)    ALT 19  0 - 35 (U/L)    Total Protein 7.5  6.0 - 8.3 (g/dL)    Albumin 4.1  3.5 - 5.2 (g/dL)    Calcium 11.9  8.4 - 10.5 (mg/dL)        FILMS:  As already discussed.   Assessment: 77 year old [place] woman status post right breast biopsy November of 2012 for a clinical T1c N0 (Stage II) invasive ductal carcinoma, strongly estrogen receptor positive, with a borderline MIB-1, but progesterone receptor and HER-2 negative.    Plan: Both at the conference this morning and at the clinic today our feeling is that this patient would be best served by mastectomy. Lumpectomy would likely lead to a cosmetically unacceptable result, and she is not a candidate for  reconstruction. We did discuss the possibility of neoadjuvant antiestrogen therapy, but this would only likely add anxiety and cost from repeated evaluations to this patient with already multiple medical problems and multiple active physicians. In fact mastectomy would obviate the need for radiation, and possibly even of antiestrogen therapy, depending on what kind of margins are obtained pathologically.  The plan accordingly is to proceed with surgery. I have made a return appointment for the patient in January. At that time we will make a definitive decision regarding adjuvant treatment. We will try to obtain the results of her bone density prior to that visit.   Clee Pandit C 12/30/2010, 4:48 PM

## 2010-12-30 NOTE — Patient Instructions (Signed)
Total or Modified Radical Mastectomy  Care After Refer to this sheet in the next few weeks. These instructions provide you with information on caring for yourself after your procedure. Your caregiver may also give you more specific instructions. Your treatment has been planned according to current medical practices, but problems sometimes occur. Call your caregiver if you have any problems or questions after your procedure. ACTIVITY  Your caregiver will advise you when you may resume strenuous activities, driving, and sports.   After the drain(s) are removed, you may do light housework. Avoid heavy lifting, carrying, or pushing. You should not be lifting anything heavier than 5 lbs.   Take frequent rest periods. You may tire more easily than usual.   Always rest and elevate the arm affected by your surgery for a period of time equal to your activity time.   Continue doing the exercises given to you by the physical therapist/occupational therapist even after full range of motion has returned. The amount of time this takes will vary from person to person.   After normal range of motion has returned, some stiffness and soreness may persist for 2-3 months. This is normal and will subside.   Begin sports or strenuous activities in moderation. This will give you a chance to rebuild your endurance. Continue to be cautious of heavy lifting or carrying (no more than 10 lbs.) with your affected arm.   You may return to work as recommended by your caregiver.  NUTRITION  You may resume your normal diet.   Make sure you drink plenty of fluids (6-8 glasses a day).   Eat a well-balanced diet. Including daily portions of food from government recommended food groups:   Grains.   Vegetables.   Fruits.   Milk.   Meat & beans.   Oils.  Visit http://mypyramid.gov/ for more information HYGIENE  You may wash your hair.   If your incision (cut from surgery) is closed, you may shower or tub bathe,  unless instructed otherwise by your doctor.  FEVER  If you feel feverish or have shaking chills, take your temperature. If your temperature is 102 F (38.9 C) or above, call your caregiver. The fever may mean there is an infection.   If you call early, infection can be treated with antibiotics and hospitalization may be avoided.  PAIN CONTROL  Mild discomfort may occur.   You may need to take an over-the-counter pain medication or a medication prescribed by your caregiver.   Call your caregiver if you experience increased pain.  INCISION CARE  Check your incision daily for increased redness, drainage, swelling, or separation of skin.   Call your caregiver if any of the above are noted.  ARM AND HAND CARE  If the lymph nodes under your arm were removed with a modified radical mastectomy, there may be a greater tendency for the arm to swell.   Try to avoid having blood pressures taken, blood drawn, or injections given in the affected arm. This is the arm on the same side as the surgery.   Use hand lotion to soften cuticles instead of cutting them to avoid cutting yourself.   Be careful when shaving your under arms. Use an electric shaver if possible. You may use a deodorant after the incision has completely healed. Until then, clean under your arms with hydrogen peroxide.   Use reasonable precaution when cooking, sewing, and gardening to avoid burning or needle or thorn pricks.   Do not weigh your arm straight   down with a package or your purse.   Follow the exercises and instructions given to you by the physical therapist/occupational therapist and your caregiver.  FOLLOW-UP APPOINTMENT Call your caregiver for a follow-up appointment as directed. PROSTHESIS INFORMATION Wear your temporary prosthesis (artificial breast) until your caregiver gives you permission to purchase a permanent one. This will depend upon your rate of healing. We suggest you also wait until you are physically  and emotionally ready to shop for one. The suitability depends on several individual factors. We do not endorse any particular prosthesis, but suggest you try several until you are satisfied with appearance and fit. A list of stores may be obtained from your local [employer] at www.cancer.org or 1-800-ACS-2345 (1-660-395-7852). A permanent prosthesis is medically necessary to restore balance. It is also income tax deductible. Be sure all receipts are marked "surgical". It is not essential to purchase a bra. You may sew a pocket into your regular bra. Note: Remember to take all of your medical insurance information with you when shopping for your prosthesis. SELECTING A PROSTHESIS FITTER You may want to ask the following questions when selecting a fitter:  What styles and brands of forms are carried in stock?   How long have the forms been on the market and have there been any problems with them?   Why would one form be better than another?   How long should a particular form last?   May I wear the form for a trial period without obligation?   Do the forms require a prosthetic bra? If so, what is the price range? Must I always wear that style?   If alterations to the bra are necessary, can they be done at this location or be sent out?   Will I be charged for alterations?   Will I receive suggestions on how to alter my own wardrobe, if necessary?   Will you special order forms or bras if necessary?   Are fitters always available to meet my needs?   What kinds of garments should be worn for the fitting?   Are lounge wear, swim wear, and accessories available?   If I have insurance coverage or Medicare, will you suggest ways for processing the paper work?   Do you keep complete records so that mail reordering is possible?   How are warranty claims handled if I have a problem with the form?  Document Released: 09/11/2003 Document Revised: 09/30/2010 Document Reviewed:  05/16/2007 [facility] Patient Information 2012 [place], [place].

## 2010-12-30 NOTE — Telephone Encounter (Signed)
gave patient appointment for bone density on 02-16-2011 at [facility] at 2:00pm gave patient appointment for 02-22-2011 at 2:30pm with magrinat

## 2010-12-31 ENCOUNTER — Encounter: Payer: Self-pay | Admitting: Radiation Oncology

## 2010-12-31 NOTE — Progress Notes (Signed)
CC:   Lowella Dell, M.D. Juanetta Gosling, MD Jeralyn Ruths, MD  REFERRING PHYSICIAN:  Jeralyn Ruths, MD  REFERRING PHYSICIAN:  Jeralyn Ruths, MD.  HISTORY OF PRESENT ILLNESS:  The patient is a pleasant 77 year old female with multiple medical problems who is seen today in multidisciplinary breast clinic for consideration of her treatment options with regards to radiation.  The patient does state that she has a history of fibrocystic changes within the breast, and she has had some biopsies before, although her last biopsy was very long ago.  She does not have any history of any breast disease or positive biopsy.  The patient had a routine screening mammogram on 12/15/2010, and there was a suggestion of an upper inner quadrant right breast mass.  She was brought back for diagnostic views and ultrasonography, and a 1.8 cm multilobulated mass was confirmed.  On ultrasound, this measured 2.1 cm and was hypoechoic and ill defined.  A biopsy was performed, and this showed an invasive ductal carcinoma, grade 2.  The receptor studies have indicated that the tumor is ER positive, PR negative and HER-2/neu negative.  The proliferative index is 25%.  The patient has not had an MRI scan because she does have a pacemaker in place.  This led to a BS GI scan earlier which showed a solitary focus of increased uptake measuring 2.3 cm, which did correspond to her earlier findings. Therefore, I have been asked to see the patient for consideration of radiation as potentially part of her overall treatment plan.  She did have her case discussed at the multidisciplinary breast clinic this morning.  PAST MEDICAL HISTORY:  Sick sinus syndrome, glaucoma, stroke, seasonal allergies, GERD, hypertension, hyperlipidemia, history of atrial fibrillation.  PAST SURGICAL HISTORY:  Cholecystectomy, vesicovaginal fistula surgery, atrial ablative surgery, knee arthroscopy, cataract  surgery.  CURRENT MEDICATIONS:  Tylenol, Norvasc, Alphagan, multivitamins, Hyzaar, Zyrtec, Protonix, Pravachol, propranolol, warfarin.  ALLERGIES:  No known drug allergies.  FAMILY HISTORY:  No history of breast cancer in her immediate family, except for 2 aunts which she states have breast cancer.  Her father had metastatic brain cancer, and she has a brother with non-Hodgkin's lymphoma.  SOCIAL HISTORY:  The patient is retired and used to work at a bank.  She lives in [place].  The patient has 1 daughter and 2 grandsons.  REVIEW OF SYSTEMS:  Fully reviewed and documented in the medical chart.  PHYSICAL EXAM:  Vital Signs:  Weight 157 pounds.  Blood pressure 107/69, pulse 76, respiratory rate 20 and temperature 97.8.  General:  A well- developed female in no acute distress.  Alert and oriented x3.  HEENT: Normocephalic, atraumatic.  Neck:  Supple without any lymphadenopathy. Cardiovascular:  Regular rate and rhythm.  Respiratory:  Clear to auscultation bilaterally.  Breasts:  An easily palpable mass is present within the upper inner quadrant with some associated bruising.  This is present within the right breast with no axillary adenopathy present.  No suspicious findings on the left.  GI:  Abdomen is soft, nontender. Normal bowel sounds.  Extremities:  No edema.  IMPRESSION AND PLAN:  Ms. Giovannini is a pleasant 77 year old female with multiple medical problems who is seen in multidisciplinary breast clinic today for development of her treatment options.  The patient is to proceed with surgery for what clinically is a T1c N0 tumor, which is ER positive.  She is to discuss with Dr. Dwain Sarna in particular her possible surgical options, and she is to proceed with  a lumpectomy or possibly a mastectomy.  We have discussed that there are some potentially significant advantages for a mastectomy in her case, and one of these is that it is very unlikely that she would require  further discussion of radiation treatment if she proceeds with this.  Given her health status, this is certainly an advantage.  Therefore, she is to make a final decision and to proceed with this.  I have not requested a definite followup with this in mind.  If the patient does proceed with a lumpectomy, then I do believe that it would be reasonable to at least review her final pathology and to discuss the pros and cons of radiation treatment, although she is a patient for whom radiation very well may not play a role in her care.  She is also seeing Dr. Darnelle Catalan to discuss possible systemic treatment options, and he is to make a final decision regarding hormonal treatment.    ______________________________ Radene Gunning, M.D., Ph.D. JSM/MEDQ  D:  12/31/2010  T:  12/31/2010  Job:  [id number]

## 2011-01-01 ENCOUNTER — Encounter (INDEPENDENT_AMBULATORY_CARE_PROVIDER_SITE_OTHER): Payer: Self-pay

## 2011-01-01 NOTE — Telephone Encounter (Signed)
confirmed [facility] appt 12/30/10

## 2011-01-04 ENCOUNTER — Telehealth (INDEPENDENT_AMBULATORY_CARE_PROVIDER_SITE_OTHER): Payer: Self-pay

## 2011-01-04 NOTE — Telephone Encounter (Signed)
LMOM for pt to call our office/ [facility]

## 2011-01-05 ENCOUNTER — Telehealth (INDEPENDENT_AMBULATORY_CARE_PROVIDER_SITE_OTHER): Payer: Self-pay

## 2011-01-05 ENCOUNTER — Ambulatory Visit (INDEPENDENT_AMBULATORY_CARE_PROVIDER_SITE_OTHER): Payer: Medicare Other

## 2011-01-05 DIAGNOSIS — J309 Allergic rhinitis, unspecified: Secondary | ICD-10-CM

## 2011-01-05 NOTE — Telephone Encounter (Signed)
LMOM for Dr Hoyle Barr nurse to call me to discuss this pt's pacemaker/ [facility]

## 2011-01-06 ENCOUNTER — Other Ambulatory Visit (INDEPENDENT_AMBULATORY_CARE_PROVIDER_SITE_OTHER): Payer: Self-pay | Admitting: General Surgery

## 2011-01-06 ENCOUNTER — Telehealth (INDEPENDENT_AMBULATORY_CARE_PROVIDER_SITE_OTHER): Payer: Self-pay

## 2011-01-06 MED ORDER — DEXTROSE 5 % IV SOLN: 1.0000 g | INTRAVENOUS | Status: DC

## 2011-01-06 NOTE — Telephone Encounter (Signed)
Pt advised that she has been cleared for surgery. I advised pt that I was taking her info to our surgery schedulers today and they will call her with the surgery date once she receives the surgery date she will need to STOP her Coumadin 5 days before surgery. The pt said she understands I asked if she wanted me to call her daughter but she said no she would call her daughter./ [facility]

## 2011-01-07 ENCOUNTER — Telehealth (INDEPENDENT_AMBULATORY_CARE_PROVIDER_SITE_OTHER): Payer: Self-pay

## 2011-01-07 NOTE — Telephone Encounter (Signed)
LMOM notifying that the pt needs to stop her Coumadin 5days before her surgery on 01-15-11. If any questions to please call./ [facility]

## 2011-01-08 ENCOUNTER — Encounter (HOSPITAL_COMMUNITY): Payer: Self-pay

## 2011-01-11 ENCOUNTER — Encounter: Payer: Self-pay | Admitting: *Deleted

## 2011-01-11 ENCOUNTER — Telehealth: Payer: Self-pay | Admitting: Oncology

## 2011-01-11 ENCOUNTER — Telehealth: Payer: Self-pay | Admitting: *Deleted

## 2011-01-11 NOTE — Telephone Encounter (Signed)
Spoke with pt concerning [facility] from 12/30/10.  Pt denies questions or concerns at this time.  Encourage pt to call with needs.  Received verbal understanding.  Contact information given.

## 2011-01-11 NOTE — Telephone Encounter (Signed)
called pts home lmovm for bone density scan appt for 02/09/2011 @ 10:30am @ [facility]

## 2011-01-13 ENCOUNTER — Encounter (HOSPITAL_COMMUNITY)
Admission: RE | Admit: 2011-01-13 | Discharge: 2011-01-13 | Disposition: A | Discharge status: Home / Self Care | Payer: Medicare Other | Source: Ambulatory Visit | Attending: General Surgery | Admitting: General Surgery

## 2011-01-13 ENCOUNTER — Encounter (HOSPITAL_COMMUNITY)
Admission: RE | Admit: 2011-01-13 | Discharge: 2011-01-13 | Disposition: A | Discharge status: Home / Self Care | Payer: Medicare Other | Source: Ambulatory Visit | Attending: Anesthesiology | Admitting: Anesthesiology

## 2011-01-13 ENCOUNTER — Encounter: Payer: Self-pay | Admitting: *Deleted

## 2011-01-13 ENCOUNTER — Other Ambulatory Visit: Payer: Self-pay

## 2011-01-13 ENCOUNTER — Encounter (HOSPITAL_COMMUNITY): Payer: Self-pay

## 2011-01-13 HISTORY — DX: Unspecified osteoarthritis, unspecified site: M19.90

## 2011-01-13 HISTORY — DX: Malignant (primary) neoplasm, unspecified: C80.1

## 2011-01-13 LAB — BASIC METABOLIC PANEL
BUN: 17 mg/dL (ref 6–23)
CO2: 27 mEq/L (ref 19–32)
Calcium: 10.1 mg/dL (ref 8.4–10.5)
Chloride: 101 mEq/L (ref 96–112)
Creatinine, Ser: 0.9 mg/dL (ref 0.50–1.10)
GFR calc Af Amer: 68 mL/min — ABNORMAL LOW (ref >90)
GFR calc non Af Amer: 59 mL/min — ABNORMAL LOW (ref >90)
Glucose, Bld: 94 mg/dL (ref 70–99)
Potassium: 4.2 mEq/L (ref 3.5–5.1)
Sodium: 141 mEq/L (ref 135–145)

## 2011-01-13 LAB — SURGICAL PCR SCREEN
MRSA, PCR: NEGATIVE
Staphylococcus aureus: NEGATIVE

## 2011-01-13 LAB — CBC
HCT: 39.2 % (ref 36.0–46.0)
Hemoglobin: 13.5 g/dL (ref 12.0–15.0)
MCH: 29.8 pg (ref 26.0–34.0)
MCHC: 34.4 g/dL (ref 30.0–36.0)
MCV: 86.5 fL (ref 78.0–100.0)
Platelets: 177 10*3/uL (ref 150–400)
RBC: 4.53 MIL/uL (ref 3.87–5.11)
RDW: 13.6 % (ref 11.5–15.5)
WBC: 7.7 10*3/uL (ref 4.0–10.5)

## 2011-01-13 LAB — PROTIME-INR
INR: 1.2 (ref 0.00–1.49)
Prothrombin Time: 15.5 seconds — ABNORMAL HIGH (ref 11.6–15.2)

## 2011-01-13 LAB — APTT: aPTT: 37 seconds (ref 24–37)

## 2011-01-13 NOTE — Progress Notes (Signed)
Mailed after appt letter to pt. 

## 2011-01-13 NOTE — Pre-Procedure Instructions (Signed)
20 Debra Lynn  01/13/2011   Your procedure is scheduled on:  Friday 01/15/11  Report to [facility] at 1050 AM.  Call this number if you have problems the morning of surgery: [phone]   Remember:   Do not eat food:After Midnight.  May have clear liquids: up to 4 Hours before arrival.  Clear liquids include soda, tea, black coffee, apple or grape juice, broth.  Take these medicines the morning of surgery with A SIP OF WATER: TYLENOL, NORVASC, PROTONIX, PROPAOLOL(INDERAL), EYE DROPS   Do not wear jewelry, make-up or nail polish.  Do not wear lotions, powders, or perfumes. You may wear deodorant.  Do not shave 48 hours prior to surgery.  Do not bring valuables to the hospital.  Contacts, dentures or bridgework may not be worn into surgery.  Leave suitcase in the car. After surgery it may be brought to your room.  For patients admitted to the hospital, checkout time is 11:00 AM the day of discharge.   Patients discharged the day of surgery will not be allowed to drive home.  Name and phone number of your driver:  Special Instructions: CHG Shower Use Special Wash: 1/2 bottle night before surgery and 1/2 bottle morning of surgery.   Please read over the following fact sheets that you were given: Pain Booklet, MRSA Information and Surgical Site Infection Prevention   STOP ASPIRIN, COUMADIN, PLAVIX, EFFIENT, HERBAL MEDICINES.

## 2011-01-13 NOTE — Progress Notes (Signed)
Faxed pacemaker order form to [facility] .

## 2011-01-14 ENCOUNTER — Ambulatory Visit (INDEPENDENT_AMBULATORY_CARE_PROVIDER_SITE_OTHER): Payer: Medicare Other

## 2011-01-14 DIAGNOSIS — J309 Allergic rhinitis, unspecified: Secondary | ICD-10-CM

## 2011-01-14 MED ORDER — CEFAZOLIN SODIUM-DEXTROSE 2-3 GM-% IV SOLR
2.0000 g | INTRAVENOUS | Status: AC
  Administered 2011-01-15: 2 g via INTRAVENOUS
  Filled 2011-01-14: qty 50

## 2011-01-14 NOTE — Progress Notes (Signed)
Spoke with Kerry Fort from [facility] regarding patient's pacemaker. Informed rep of date and time of surgery. States that he is aware.

## 2011-01-15 ENCOUNTER — Encounter (HOSPITAL_COMMUNITY)
Admission: RE | Disposition: A | Discharge status: Home / Self Care | Payer: Self-pay | Source: Ambulatory Visit | Attending: General Surgery

## 2011-01-15 ENCOUNTER — Other Ambulatory Visit (INDEPENDENT_AMBULATORY_CARE_PROVIDER_SITE_OTHER): Payer: Self-pay | Admitting: General Surgery

## 2011-01-15 ENCOUNTER — Inpatient Hospital Stay (HOSPITAL_COMMUNITY)
Admission: RE | Admit: 2011-01-15 | Discharge: 2011-01-18 | DRG: 583 | Disposition: A | Discharge status: Home / Self Care | Payer: Medicare Other | Source: Ambulatory Visit | Attending: General Surgery | Admitting: General Surgery

## 2011-01-15 ENCOUNTER — Inpatient Hospital Stay (HOSPITAL_COMMUNITY): Payer: Medicare Other | Admitting: Certified Registered Nurse Anesthetist

## 2011-01-15 ENCOUNTER — Encounter (HOSPITAL_COMMUNITY): Payer: Self-pay | Admitting: Certified Registered Nurse Anesthetist

## 2011-01-15 ENCOUNTER — Encounter (HOSPITAL_COMMUNITY): Payer: Self-pay | Admitting: *Deleted

## 2011-01-15 DIAGNOSIS — Z170 Estrogen receptor positive status [ER+]: Secondary | ICD-10-CM

## 2011-01-15 DIAGNOSIS — C50919 Malignant neoplasm of unspecified site of unspecified female breast: Secondary | ICD-10-CM

## 2011-01-15 DIAGNOSIS — Z7901 Long term (current) use of anticoagulants: Secondary | ICD-10-CM

## 2011-01-15 DIAGNOSIS — Z950 Presence of cardiac pacemaker: Secondary | ICD-10-CM

## 2011-01-15 DIAGNOSIS — I10 Essential (primary) hypertension: Secondary | ICD-10-CM | POA: Diagnosis present

## 2011-01-15 DIAGNOSIS — K219 Gastro-esophageal reflux disease without esophagitis: Secondary | ICD-10-CM | POA: Diagnosis present

## 2011-01-15 DIAGNOSIS — I509 Heart failure, unspecified: Secondary | ICD-10-CM | POA: Diagnosis present

## 2011-01-15 DIAGNOSIS — I4891 Unspecified atrial fibrillation: Secondary | ICD-10-CM | POA: Diagnosis present

## 2011-01-15 DIAGNOSIS — Z8673 Personal history of transient ischemic attack (TIA), and cerebral infarction without residual deficits: Secondary | ICD-10-CM

## 2011-01-15 DIAGNOSIS — Z6827 Body mass index (BMI) 27.0-27.9, adult: Secondary | ICD-10-CM

## 2011-01-15 DIAGNOSIS — C50219 Malignant neoplasm of upper-inner quadrant of unspecified female breast: Principal | ICD-10-CM | POA: Diagnosis present

## 2011-01-15 HISTORY — PX: BREAST SURGERY: SHX581

## 2011-01-15 SURGERY — SIMPLE MASTECTOMY
Anesthesia: General | Site: Breast | Laterality: Right | Wound class: Clean

## 2011-01-15 MED ORDER — ACETAMINOPHEN 500 MG PO TABS
500.0000 mg | ORAL_TABLET | Freq: Four times a day (QID) | ORAL | Status: DC | PRN
  Administered 2011-01-17 – 2011-01-18 (×3): 500 mg via ORAL
  Filled 2011-01-15 (×4): qty 1

## 2011-01-15 MED ORDER — CEFAZOLIN SODIUM 1-5 GM-% IV SOLN
1.0000 g | INTRAVENOUS | Status: DC
  Filled 2011-01-15: qty 50

## 2011-01-15 MED ORDER — LOSARTAN POTASSIUM-HCTZ 100-25 MG PO TABS: 1.0000 | ORAL_TABLET | Freq: Every day | ORAL | Status: DC

## 2011-01-15 MED ORDER — NITROFURANTOIN MACROCRYSTAL 100 MG PO CAPS
100.0000 mg | ORAL_CAPSULE | Freq: Two times a day (BID) | ORAL | Status: AC
  Administered 2011-01-15 – 2011-01-16 (×2): 100 mg via ORAL
  Filled 2011-01-15 (×3): qty 1

## 2011-01-15 MED ORDER — WARFARIN SODIUM 4 MG PO TABS
4.0000 mg | ORAL_TABLET | Freq: Once | ORAL | Status: AC
  Administered 2011-01-15: 4 mg via ORAL
  Filled 2011-01-15: qty 1

## 2011-01-15 MED ORDER — AMLODIPINE BESYLATE 2.5 MG PO TABS
2.5000 mg | ORAL_TABLET | Freq: Every day | ORAL | Status: DC
  Administered 2011-01-16 – 2011-01-18 (×3): 2.5 mg via ORAL
  Filled 2011-01-15 (×3): qty 1

## 2011-01-15 MED ORDER — WARFARIN SODIUM 2.5 MG PO TABS: 2.5000 mg | ORAL_TABLET | Freq: Every day | ORAL | Status: DC

## 2011-01-15 MED ORDER — MORPHINE SULFATE 2 MG/ML IJ SOLN: 2.0000 mg | INTRAMUSCULAR | Status: DC | PRN

## 2011-01-15 MED ORDER — OXYCODONE-ACETAMINOPHEN 5-325 MG PO TABS
1.0000 | ORAL_TABLET | ORAL | Status: DC | PRN
  Administered 2011-01-15: 1 via ORAL
  Administered 2011-01-15: 2 via ORAL
  Administered 2011-01-16: 1 via ORAL
  Administered 2011-01-16: 2 via ORAL
  Administered 2011-01-16: 1 via ORAL
  Filled 2011-01-15: qty 2
  Filled 2011-01-15 (×2): qty 1
  Filled 2011-01-15: qty 2
  Filled 2011-01-15: qty 1
  Filled 2011-01-15: qty 2

## 2011-01-15 MED ORDER — POTASSIUM CHLORIDE CRYS ER 20 MEQ PO TBCR
20.0000 meq | EXTENDED_RELEASE_TABLET | Freq: Every day | ORAL | Status: DC
  Administered 2011-01-15 – 2011-01-18 (×4): 20 meq via ORAL
  Filled 2011-01-15 (×4): qty 1

## 2011-01-15 MED ORDER — ONDANSETRON HCL 4 MG/2ML IJ SOLN: 4.0000 mg | Freq: Four times a day (QID) | INTRAMUSCULAR | Status: DC | PRN

## 2011-01-15 MED ORDER — ROCURONIUM BROMIDE 100 MG/10ML IV SOLN
INTRAVENOUS | Status: DC | PRN
  Administered 2011-01-15: 40 mg via INTRAVENOUS

## 2011-01-15 MED ORDER — ONDANSETRON HCL 4 MG/2ML IJ SOLN
INTRAMUSCULAR | Status: DC | PRN
  Administered 2011-01-15: 4 mg via INTRAVENOUS

## 2011-01-15 MED ORDER — CALCIUM-VITAMIN D 500-200 MG-UNIT PO TABS: 1.0000 | ORAL_TABLET | Freq: Every day | ORAL | Status: DC

## 2011-01-15 MED ORDER — LACTATED RINGERS IV SOLN
INTRAVENOUS | Status: DC
  Administered 2011-01-15 – 2011-01-16 (×2): via INTRAVENOUS

## 2011-01-15 MED ORDER — NEOSTIGMINE METHYLSULFATE 1 MG/ML IJ SOLN
INTRAMUSCULAR | Status: DC | PRN
  Administered 2011-01-15: 4 mg via INTRAVENOUS

## 2011-01-15 MED ORDER — CALCIUM CARBONATE-VITAMIN D 500-200 MG-UNIT PO TABS
1.0000 | ORAL_TABLET | Freq: Every day | ORAL | Status: DC
  Administered 2011-01-15 – 2011-01-18 (×4): 1 via ORAL
  Filled 2011-01-15 (×4): qty 1

## 2011-01-15 MED ORDER — CYCLOSPORINE 0.05 % OP EMUL
1.0000 [drp] | Freq: Two times a day (BID) | OPHTHALMIC | Status: DC
  Administered 2011-01-15 – 2011-01-17 (×5): 1 [drp] via OPHTHALMIC
  Filled 2011-01-15 (×7): qty 1

## 2011-01-15 MED ORDER — PHENYLEPHRINE HCL 10 MG/ML IJ SOLN
INTRAMUSCULAR | Status: DC | PRN
  Administered 2011-01-15 (×3): 40 ug via INTRAVENOUS

## 2011-01-15 MED ORDER — PANTOPRAZOLE SODIUM 40 MG PO TBEC
40.0000 mg | DELAYED_RELEASE_TABLET | Freq: Every day | ORAL | Status: DC
  Administered 2011-01-16 – 2011-01-17 (×2): 40 mg via ORAL
  Filled 2011-01-15 (×2): qty 1

## 2011-01-15 MED ORDER — PROMETHAZINE HCL 25 MG/ML IJ SOLN: 6.2500 mg | INTRAMUSCULAR | Status: DC | PRN

## 2011-01-15 MED ORDER — THERA M PLUS PO TABS
1.0000 | ORAL_TABLET | Freq: Every day | ORAL | Status: DC
  Filled 2011-01-15: qty 1

## 2011-01-15 MED ORDER — SENNA 8.6 MG PO TABS
1.0000 | ORAL_TABLET | Freq: Two times a day (BID) | ORAL | Status: DC
  Administered 2011-01-15 – 2011-01-18 (×6): 8.6 mg via ORAL
  Filled 2011-01-15 (×9): qty 1

## 2011-01-15 MED ORDER — PROPRANOLOL HCL ER 60 MG PO CP24
60.0000 mg | ORAL_CAPSULE | Freq: Every day | ORAL | Status: DC
  Administered 2011-01-15 – 2011-01-18 (×3): 60 mg via ORAL
  Filled 2011-01-15 (×5): qty 1

## 2011-01-15 MED ORDER — PROPOFOL 10 MG/ML IV EMUL
INTRAVENOUS | Status: DC | PRN
  Administered 2011-01-15: 125 mg via INTRAVENOUS

## 2011-01-15 MED ORDER — GLYCOPYRROLATE 0.2 MG/ML IJ SOLN
INTRAMUSCULAR | Status: DC | PRN
  Administered 2011-01-15: .4 mg via INTRAVENOUS

## 2011-01-15 MED ORDER — LORATADINE 10 MG PO TABS
10.0000 mg | ORAL_TABLET | Freq: Every day | ORAL | Status: DC | PRN
  Filled 2011-01-15: qty 1

## 2011-01-15 MED ORDER — DEXAMETHASONE SODIUM PHOSPHATE 4 MG/ML IJ SOLN
INTRAMUSCULAR | Status: DC | PRN
  Administered 2011-01-15: 4 mg via INTRAVENOUS

## 2011-01-15 MED ORDER — FENTANYL CITRATE 0.05 MG/ML IJ SOLN
INTRAMUSCULAR | Status: DC | PRN
  Administered 2011-01-15: 50 ug via INTRAVENOUS
  Administered 2011-01-15: 100 ug via INTRAVENOUS
  Administered 2011-01-15: 25 ug via INTRAVENOUS
  Administered 2011-01-15: 50 ug via INTRAVENOUS
  Administered 2011-01-15: 25 ug via INTRAVENOUS

## 2011-01-15 MED ORDER — CENTRUM SILVER PO TABS: 1.0000 | ORAL_TABLET | Freq: Every day | ORAL | Status: DC

## 2011-01-15 MED ORDER — LACTATED RINGERS IV SOLN
INTRAVENOUS | Status: DC | PRN
  Administered 2011-01-15 (×2): via INTRAVENOUS

## 2011-01-15 MED ORDER — POLYETHYLENE GLYCOL 3350 17 G PO PACK
17.0000 g | PACK | Freq: Every day | ORAL | Status: DC | PRN
  Filled 2011-01-15 (×2): qty 1

## 2011-01-15 MED ORDER — HYDROCHLOROTHIAZIDE 25 MG PO TABS
25.0000 mg | ORAL_TABLET | Freq: Every day | ORAL | Status: DC
  Administered 2011-01-15 – 2011-01-18 (×4): 25 mg via ORAL
  Filled 2011-01-15 (×4): qty 1

## 2011-01-15 MED ORDER — LACTATED RINGERS IV SOLN
INTRAVENOUS | Status: DC
  Administered 2011-01-15: 14:00:00 via INTRAVENOUS

## 2011-01-15 MED ORDER — DOCUSATE SODIUM 100 MG PO CAPS
100.0000 mg | ORAL_CAPSULE | Freq: Two times a day (BID) | ORAL | Status: DC
  Administered 2011-01-15 – 2011-01-18 (×6): 100 mg via ORAL
  Filled 2011-01-15 (×6): qty 1

## 2011-01-15 MED ORDER — 0.9 % SODIUM CHLORIDE (POUR BTL) OPTIME
TOPICAL | Status: DC | PRN
  Administered 2011-01-15: 1000 mL

## 2011-01-15 MED ORDER — SODIUM CHLORIDE 0.9 % IV SOLN: INTRAVENOUS | Status: DC

## 2011-01-15 MED ORDER — FENTANYL CITRATE 0.05 MG/ML IJ SOLN: 25.0000 ug | INTRAMUSCULAR | Status: DC | PRN

## 2011-01-15 MED ORDER — COLESTIPOL HCL 1 G PO TABS
1.0000 g | ORAL_TABLET | Freq: Every day | ORAL | Status: DC
  Filled 2011-01-15: qty 1

## 2011-01-15 MED ORDER — ADULT MULTIVITAMIN W/MINERALS CH
1.0000 | ORAL_TABLET | Freq: Every day | ORAL | Status: DC
  Administered 2011-01-15 – 2011-01-18 (×4): 1 via ORAL
  Filled 2011-01-15 (×4): qty 1

## 2011-01-15 MED ORDER — LOSARTAN POTASSIUM 50 MG PO TABS
100.0000 mg | ORAL_TABLET | Freq: Every day | ORAL | Status: DC
  Administered 2011-01-15 – 2011-01-18 (×4): 100 mg via ORAL
  Filled 2011-01-15 (×4): qty 2

## 2011-01-15 SURGICAL SUPPLY — 38 items
BINDER BREAST LRG (GAUZE/BANDAGES/DRESSINGS) ×4 IMPLANT
BINDER BREAST XLRG (GAUZE/BANDAGES/DRESSINGS) IMPLANT
CHLORAPREP W/TINT 26ML (MISCELLANEOUS) ×2 IMPLANT
CLOTH BEACON ORANGE TIMEOUT ST (SAFETY) ×2 IMPLANT
COVER SURGICAL LIGHT HANDLE (MISCELLANEOUS) ×2 IMPLANT
DERMABOND ADHESIVE PROPEN (GAUZE/BANDAGES/DRESSINGS) ×1
DERMABOND ADVANCED (GAUZE/BANDAGES/DRESSINGS) ×1
DERMABOND ADVANCED .7 DNX12 (GAUZE/BANDAGES/DRESSINGS) ×1 IMPLANT
DERMABOND ADVANCED .7 DNX6 (GAUZE/BANDAGES/DRESSINGS) ×1 IMPLANT
DRAIN CHANNEL 19F RND (DRAIN) ×2 IMPLANT
DRAPE LAPAROSCOPIC ABDOMINAL (DRAPES) ×2 IMPLANT
DRAPE UTILITY 15X26 W/TAPE STR (DRAPE) ×4 IMPLANT
ELECT BLADE 4.0 EZ CLEAN MEGAD (MISCELLANEOUS) ×2
ELECT CAUTERY BLADE 6.4 (BLADE) ×2 IMPLANT
ELECT REM PT RETURN 9FT ADLT (ELECTROSURGICAL) ×2
ELECTRODE BLDE 4.0 EZ CLN MEGD (MISCELLANEOUS) ×1 IMPLANT
ELECTRODE REM PT RTRN 9FT ADLT (ELECTROSURGICAL) ×1 IMPLANT
EVACUATOR SILICONE 100CC (DRAIN) ×2 IMPLANT
GLOVE BIO SURGEON STRL SZ7 (GLOVE) ×2 IMPLANT
GLOVE BIO SURGEON STRL SZ7.5 (GLOVE) ×2 IMPLANT
GLOVE BIOGEL PI IND STRL 6.5 (GLOVE) ×1 IMPLANT
GLOVE BIOGEL PI IND STRL 7.5 (GLOVE) ×2 IMPLANT
GLOVE BIOGEL PI INDICATOR 6.5 (GLOVE) ×1
GLOVE BIOGEL PI INDICATOR 7.5 (GLOVE) ×2
GOWN STRL NON-REIN LRG LVL3 (GOWN DISPOSABLE) ×6 IMPLANT
KIT BASIN OR (CUSTOM PROCEDURE TRAY) ×2 IMPLANT
KIT ROOM TURNOVER OR (KITS) ×2 IMPLANT
NS IRRIG 1000ML POUR BTL (IV SOLUTION) ×2 IMPLANT
PACK GENERAL/GYN (CUSTOM PROCEDURE TRAY) ×2 IMPLANT
PAD ARMBOARD 7.5X6 YLW CONV (MISCELLANEOUS) ×2 IMPLANT
SPONGE GAUZE 4X4 12PLY (GAUZE/BANDAGES/DRESSINGS) ×2 IMPLANT
STRIP CLOSURE SKIN 1/2X4 (GAUZE/BANDAGES/DRESSINGS) ×2 IMPLANT
SUT ETHILON 2 0 FS 18 (SUTURE) ×6 IMPLANT
SUT MON AB 4-0 PC3 18 (SUTURE) ×2 IMPLANT
SUT SILK 2 0 SH (SUTURE) ×2 IMPLANT
SUT VIC AB 3-0 SH 18 (SUTURE) ×4 IMPLANT
TOWEL OR 17X24 6PK STRL BLUE (TOWEL DISPOSABLE) ×2 IMPLANT
TOWEL OR 17X26 10 PK STRL BLUE (TOWEL DISPOSABLE) ×2 IMPLANT

## 2011-01-15 NOTE — Anesthesia Procedure Notes (Signed)
Procedure Name: Intubation Date/Time: 01/15/2011 3:03 PM Performed by: Leona Singleton A. Oxygen Delivery Method: Circle System Utilized Preoxygenation: Pre-oxygenation with 100% oxygen Intubation Type: IV induction Ventilation: Mask ventilation without difficulty Laryngoscope Size: Miller and 2 Grade View: Grade II Tube type: Oral Tube size: 7.5 mm Number of attempts: 1 Airway Equipment and Method: stylet Placement Confirmation: ETT inserted through vocal cords under direct vision,  positive ETCO2 and breath sounds checked- equal and bilateral Secured at: 21 cm Tube secured with: Tape Dental Injury: Teeth and Oropharynx as per pre-operative assessment  Comments: Attempted to vent with 4 LMA Proseal and 4 LMA. Neither would seal properly. Pt was intubated with ease with ETT. VSS through out

## 2011-01-15 NOTE — Op Note (Signed)
Preoperative diagnosis: Right breast cancer Postoperative diagnosis: Same as above  Procedure: Right simple mastectomy Surgeon: Dr. Harden Mo Estimated blood loss: Minimal Drains: 19 French Blake drain to mastectomy space Specimens: Right breast short stitch superior, long stitch lateral Complications: None Disposition the patient to recovery room in stable condition Sponge needle count was correct x2 at end of operation  Indications: This is an 77 year old female with multiple medical problems and presents with a clinical T2 N0 breast cancer. She was seen in the multidisciplinary clinic. After discussing this with her as well as the other providers we have decided to perform a right simple mastectomy. She has had preoperative recommendations for medical physicians. We discussed the risk and benefits of the surgery and we'll proceed today.  [place]: After informed consent was obtained the patient was taken to the operating room. She was induced 1 g of intravenous cefazolin. Sequential compression devices were placed her lotion. She was in place under general anesthesia with an endotracheal tube. Her right breast was then prepped and draped in the standard sterile surgical fashion. A surgical timeout was then performed.  I made an elliptical incision encompassing her nipple areolar complex.  I sutured a magnet to her pacemaker which is on the same side of her mastectomy.I then made flaps to the clavicle and went underneath her pacemaker. I went to the sternum medially, latissimus laterally, and inframammary crease inferiorly. I then removed the breast from the pectoralis muscle including the pectoralis fascia. I tied with 2-0 Vicryl suture the lateral most attachments. This was then passed off the table as a specimen marked as above. I then obtained hemostasis. Irrigation was performed. I then placed a 32 [place] Blake drain into this space. I secured this with a 2-0 nylon suture. I then closure  with 3-0 Vicryl and 4 Monocryl. Dermabond and Steri-Strips were then applied. A dressing was placed and a breast binder was wrapped around her. She was then extubated and transferred to the recovery room in stable condition.

## 2011-01-15 NOTE — Progress Notes (Signed)
Debra Lynn SPOKEN WITH  REGARDING PTS PACER ... OR TO PAGE HIM AT THE 800 NUMBER WHEN NEEDED ... NUMBER AND NOTE PLACED ON FRONT OF THIS CHART.

## 2011-01-15 NOTE — Interval H&P Note (Signed)
History and Physical Interval Note:  01/15/2011 1:14 PM  Debra Lynn  has presented today for surgery, with the diagnosis of Right breast cancer  The various methods of treatment have been discussed with the patient and family. After consideration of risks, benefits and other options for treatment, the patient has consented to  Procedure(s): SIMPLE MASTECTOMY as a surgical intervention .  The patients' history has been reviewed, patient examined, no change in status, stable for surgery.  I have reviewed the patients' chart and labs.  Questions were answered to the patient's satisfaction.     Carlton Sweaney

## 2011-01-15 NOTE — Transfer of Care (Signed)
Immediate Anesthesia Transfer of Care Note  Patient: Debra Lynn  Procedure(s) Performed:  SIMPLE MASTECTOMY - Right simple mastectomy  Patient Location: PACU  Anesthesia Type: General  Level of Consciousness: awake, alert  and oriented  Airway & Oxygen Therapy: Patient Spontanous Breathing and Patient connected to face mask oxygen  Post-op Assessment: Report given to PACU RN and Post -op Vital signs reviewed and stable  Post vital signs: Reviewed and stable  Complications: No apparent anesthesia complications

## 2011-01-15 NOTE — Anesthesia Postprocedure Evaluation (Signed)
  Anesthesia Post-op Note  Patient: Debra Lynn  Procedure(s) Performed:  SIMPLE MASTECTOMY - Right simple mastectomy  Patient Location: PACU  Anesthesia Type: General  Level of Consciousness: awake, alert  and oriented  Airway and Oxygen Therapy: Patient Spontanous Breathing and Patient connected to nasal cannula oxygen  Post-op Pain: none  Post-op Assessment: Post-op Vital signs reviewed, Patient's Cardiovascular Status Stable, Respiratory Function Stable, Patent Airway, No signs of Nausea or vomiting and Pain level controlled, pacemaker interrogated by rep, functioning normally  Post-op Vital Signs: Reviewed and stable  Complications: No apparent anesthesia complications

## 2011-01-15 NOTE — Preoperative (Signed)
Beta Blockers   Reason not to administer Beta Blockers:Not Applicable 

## 2011-01-15 NOTE — Anesthesia Preprocedure Evaluation (Addendum)
Anesthesia Evaluation  Patient identified by MRN, date of birth, ID band Patient awake    Reviewed: Allergy & Precautions, H&P , NPO status , Patient's Chart, lab work & pertinent test results  Airway Mallampati: II TM Distance: >3 FB Neck ROM: Full    Dental No notable dental hx. (+) Teeth Intact and Dental Advisory Given   Pulmonary neg pulmonary ROS,  clear to auscultation  Pulmonary exam normal       Cardiovascular hypertension (took BP meds today), Pt. on medications and Pt. on home beta blockers + dysrhythmias (ablation unsuccessful, now has pacemaker, afib managed with meds for rate control, no coumadin  for a week, INR1.2 on 01/13/11) Atrial Fibrillation Normal 2007 cath:  Normal coronaries, EF 40-45%   Neuro/Psych CVA (CVA 2 years ago, speech and handwriting affected)    GI/Hepatic Neg liver ROS, GERD-  Medicated and Controlled,  Endo/Other  Morbid obesity  Renal/GU negative Renal ROS     Musculoskeletal   Abdominal (+) obese,   Peds  Hematology   Anesthesia Other Findings   Reproductive/Obstetrics                          Anesthesia Physical Anesthesia Plan  ASA: III  Anesthesia Plan: General   Post-op Pain Management:    Induction:   Airway Management Planned: LMA  Additional Equipment:   Intra-op Plan:   Post-operative Plan:   Informed Consent: I have reviewed the patients History and Physical, chart, labs and discussed the procedure including the risks, benefits and alternatives for the proposed anesthesia with the patient or authorized representative who has indicated his/her understanding and acceptance.   Dental advisory given  Plan Discussed with: CRNA and Surgeon  Anesthesia Plan Comments: (Plan routine monitors, GA- LMA OK.  Pacemaker rep to check pacemaker before and after surgery, will use sterile magnet intraop to take pacer to straight asynchronous  rate/pacing.)       Anesthesia Quick Evaluation

## 2011-01-15 NOTE — Progress Notes (Signed)
ANTICOAGULATION CONSULT NOTE - Initial Consult  Pharmacy Consult for Coumadin Indication: atrial fibrillation  No Known Allergies  Patient Measurements: Wt 70.3 Kg 12/30/10  Vital Signs: Temp: 97.4 F (36.3 C) (12/14 1840) Temp src: Oral (12/14 1057) BP: 141/67 mmHg (12/14 1840) Pulse Rate: 60  (12/14 1840)  Labs:  [facility] [place] 01/13/11 1054  HGB 13.5  HCT 39.2  PLT 177  APTT 37  LABPROT 15.5*  INR 1.20  HEPARINUNFRC --  CREATININE 0.90  CKTOTAL --  CKMB --  TROPONINI --   The CrCl is unknown because both a height and weight (above a minimum accepted value) are required for this calculation.  Medical History: Past Medical History  Diagnosis Date  . Sick sinus syndrome     with pacemaker and ablation  . Stroke 2010  . Glaucoma   . Seasonal allergic rhinitis     Pos Skin Test 07-05-07  . GERD (gastroesophageal reflux disease)   . Atrial fibrillation   . Hypertension   . Hyperlipidemia   . Cancer bREAST CA(12/2010)  . Chronic kidney disease TUMOR ON RIGHT KIDNEY-OBSERVING  . Arthritis KNEES &BACK  . Neuromuscular disorder SLIGHT TREMOR.    Medications:  Scheduled:    . amLODipine  2.5 mg Oral Daily  . calcium-vitamin D  1 tablet Oral Daily  . ceFAZolin (ANCEF) IV  2 g Intravenous 60 min Pre-Op  . colestipol  1 g Oral Q1500  . cycloSPORINE  1 drop Both Eyes BID  . docusate sodium  100 mg Oral BID  . hydrochlorothiazide  25 mg Oral Daily  . losartan  100 mg Oral Daily  . multivitamins ther. w/minerals  1 tablet Oral Daily  . nitrofurantoin  100 mg Oral BID  . pantoprazole  40 mg Oral Q1200  . potassium chloride SA  20 mEq Oral Daily  . propranolol  60 mg Oral QHS  . senna  1 tablet Oral BID  . warfarin  2.5 mg Oral q1800  . DISCONTD: calcium-vitamin D  1 tablet Oral Daily  . DISCONTD: CENTRUM SILVER  1 tablet Oral Daily  . DISCONTD: losartan-hydrochlorothiazide  1 tablet Oral Daily    Assessment: 77 yo patient s/p mastectomy for breast cancer. Was  previously on Coumadin 2.5mg  daily for hx Afib, CVA. CHADS2= 4. Noted was on LMWH preop from 12/8 thro 12/13. Baseline CBC ok 12/12, INR then was below goal at 1.2.  Goal of Therapy:  INR 2-3   Plan: 1. Coumadin 4mg  PO x 1 today. 2. Will f/up daily INR. 3. Will ask MD if ok to restart Lovenox starting in AM 12/5 to bridge until INR therapeutic given CHADS2 score >2.  Debra Yasin K. Debra Lynn, PharmD, BCPS.  Clinical Pharmacist Pager [phone]. 01/15/2011 7:03 PM

## 2011-01-15 NOTE — H&P (View-Only) (Signed)
Patient ID: Debra Lynn, female   DOB: 05/28/1928, 77 y.o.   MRN: [id number]  Chief Complaint  Patient presents with  . Other    breast cancer    HPI Debra Lynn is a 77 y.o. female.  Referred by Dr. Margaret Bertrand HPI This is an 77-year-old female with multiple medical comorbidities who presents with a newly diagnosed right breast cancer. She does not really do her own exams. This was found on a screening mammogram recently which showed a 1.8 cm right upper inner quadrant mass. On ultrasound this appeared to be about 2.1 cm. On BSGI this was 2.3 cm in size. She underwent a biopsy that was ultrasound-guided with clip placement. 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 This is estrogen receptor positive at 90%, progesterone receptor is 0%, proliferation index is 25% and HER-2/neu is not amplified. She recognizes that this area is present now. She has no other symptoms referable to her breasts including any other masses, discharge, or tenderness.  She does have a significant history of being on warfarin as well as a prior stroke while she was not on warfarin that was thought to be related to her atrial fibrillation.  Past Medical History  Diagnosis Date  . Sick sinus syndrome     with pacemaker and ablation  . Stroke 2010  . Glaucoma   . Seasonal allergic rhinitis     Pos Skin Test 07-05-07  . GERD (gastroesophageal reflux disease)   . Atrial fibrillation   . Hypertension   . Hyperlipidemia     Past Surgical History  Procedure Date  . Cholecystectomy   . Vesicovaginal fistula closure w/ tah   . Atrial ablation surgery   . Flexible bronchoscopy w/ upper endoscopy     and swallowing evaluation  . Pacemaker insertion   . Knee arthroscopy   . Cataract extraction     Family History  Problem Relation Age of Onset  . Asthma Brother   . Asthma Daughter   . Heart disease    . Cancer    . Heart failure Mother   . Cancer Father   .  Lymphoma Brother   . Diabetes Brother   . Hypertension Brother     Social History History  Substance Use Topics  . Smoking status: Never Smoker   . Smokeless tobacco: Not on file  . Alcohol Use: No    No Known Allergies  Current Outpatient Prescriptions  Medication Sig Dispense Refill  . acetaminophen (TYLENOL) 500 MG tablet Take 500 mg by mouth every 6 (six) hours as needed.        . amLODipine (NORVASC) 2.5 MG tablet Take 1 tablet by mouth daily.      . brimonidine (ALPHAGAN) 0.15 % ophthalmic solution       . Calcium Carbonate-Vitamin D (CALCIUM-VITAMIN D) 500-200 MG-UNIT per tablet Take 1 tablet by mouth daily.        . cetirizine (ZYRTEC) 10 MG tablet Take 10 mg by mouth daily as needed.        . losartan-hydrochlorothiazide (HYZAAR) 100-25 MG per tablet Take 1 tablet by mouth daily.      . MICRONIZED COLESTIPOL HCL 1 G tablet Take 1 tablet by mouth daily.      . Multiple Vitamins-Minerals (CENTRUM SILVER) tablet Take 1 tablet by mouth daily.        . Omega-3 Fatty Acids (FISH OIL) 1000 MG CAPS Take 1 capsule by mouth 2 (  two) times daily.        . pantoprazole (PROTONIX) 40 MG tablet Take 1 tablet by mouth daily.      . potassium chloride SA (K-DUR,KLOR-CON) 20 MEQ tablet Take 1 tablet by mouth daily.      . pravastatin (PRAVACHOL) 40 MG tablet Take 1 tablet by mouth daily.      . propranolol (INDERAL LA) 60 MG 24 hr capsule Take 1 tablet by mouth Twice daily.      . RESTASIS 0.05 % ophthalmic emulsion       . warfarin (COUMADIN) 2.5 MG tablet Take as directed         Review of Systems Review of Systems  Constitutional: Positive for fatigue. Negative for fever, chills and unexpected weight change.  HENT: Positive for sinus pressure. Negative for hearing loss, congestion, sore throat, trouble swallowing and voice change.   Eyes: Negative for visual disturbance.  Respiratory: Positive for cough (nonproductive). Negative for wheezing.   Cardiovascular: Negative for chest  pain, palpitations and leg swelling.  Gastrointestinal: Negative for nausea, vomiting, abdominal pain, diarrhea, constipation, blood in stool, abdominal distention and anal bleeding.  Genitourinary: Negative for hematuria, vaginal bleeding and difficulty urinating.  Musculoskeletal: Negative for arthralgias.  Skin: Negative for rash and wound.  Neurological: Positive for tremors. Negative for seizures, syncope and headaches.  Hematological: Negative for adenopathy. Does not bruise/bleed easily.  Psychiatric/Behavioral: Negative for confusion.    There were no vitals taken for this visit.  Physical Exam Physical Exam  Constitutional: She appears well-developed and well-nourished. No distress.  Eyes: No scleral icterus.  Neck: Neck supple.  Cardiovascular: Normal rate, regular rhythm and normal heart sounds.   Pulmonary/Chest: Effort normal and breath sounds normal. She has no wheezes. She has no rales. Right breast exhibits no inverted nipple, no nipple discharge, no skin change and no tenderness. Left breast exhibits no inverted nipple, no mass, no nipple discharge, no skin change and no tenderness. Breasts are symmetrical.    Abdominal: Soft. There is no hepatomegaly.  Lymphadenopathy:    She has no cervical adenopathy.    She has no axillary adenopathy.       Right: No supraclavicular adenopathy present.       Left: No supraclavicular adenopathy present.    Data Reviewed MMG and u/s reviewed, BSGI reviewed at conference  Assessment    T2 Right breast cancer    Plan     We discussed the staging and pathophysiology of breast cancer. We discussed all of the different options for treatment for breast cancer including surgery, chemotherapy, radiation therapy, Herceptin, and antiestrogen therapy.   We discussed a sentinel lymph node biopsy as she does not appear to having lymph node involvement right now. We discussed the performance of that with injection of radioactive tracer  and blue dye.  I do not think a sentinel node biopsy is indicated in her as we have agreed at multidisciplinary clinic due to the fact it will not change her treatment and her nodes are clinically negative at this time. We discussed the options for treatment of the breast cancer which included lumpectomy versus a mastectomy. We discussed the performance of the lumpectomy with a wire placement. We discussed a 10-20% chance of a positive margin requiring reexcision in the operating room. We also discussed that she may need radiation therapy or antiestrogen therapy or both if she undergoes lumpectomy. We discussed mastectomy and the postoperative care for that as well. We discussed that there is no   difference in her survival whether she undergoes lumpectomy with radiation therapy or antiestrogen therapy versus a mastectomy.  I think the best plan for her given a fairly large mass and a small breasts and a lady with multiple medical comorbidities would be to proceed with a simple mastectomy on this side. This will minimize her amount of trips to the operating room as well as it may minimize the therapies for also. I think that a lumpectomy will create a fairly large cosmetic defect with the possibility of needing to return to the operating room as well. She and her family are all in agreement that minimize any time in the operating room as well as treatment and maintaining her quality of life are all the strategies if they would like to pursue as well. We discussed the risks of operation including bleeding, infection, possible reoperation. She understands her further therapy will be based on what her stages at the time of her operation.we discussed also the medical risks including leading due to the fact that we'll need to start her anticoagulation postoperatively, stroke, arrhythmia, and myocardial infarction. I'm going to obtain perioperative evaluation for both her cardiologist as well as neurology and then I will  plan on proceeding with scheduling her after that.         Nohemy Koop 12/30/2010, 4:09 PM    

## 2011-01-16 ENCOUNTER — Encounter (HOSPITAL_COMMUNITY): Payer: Self-pay | Admitting: *Deleted

## 2011-01-16 LAB — BASIC METABOLIC PANEL
BUN: 15 mg/dL (ref 6–23)
CO2: 26 mEq/L (ref 19–32)
Calcium: 8.9 mg/dL (ref 8.4–10.5)
Chloride: 99 mEq/L (ref 96–112)
Creatinine, Ser: 0.74 mg/dL (ref 0.50–1.10)
GFR calc Af Amer: >90 mL/min (ref >90)
GFR calc non Af Amer: 78 mL/min — ABNORMAL LOW (ref >90)
Glucose, Bld: 108 mg/dL — ABNORMAL HIGH (ref 70–99)
Potassium: 3.9 mEq/L (ref 3.5–5.1)
Sodium: 135 mEq/L (ref 135–145)

## 2011-01-16 LAB — CBC
HCT: 32.8 % — ABNORMAL LOW (ref 36.0–46.0)
Hemoglobin: 11.1 g/dL — ABNORMAL LOW (ref 12.0–15.0)
MCH: 28.9 pg (ref 26.0–34.0)
MCHC: 33.8 g/dL (ref 30.0–36.0)
MCV: 85.4 fL (ref 78.0–100.0)
Platelets: 153 10*3/uL (ref 150–400)
RBC: 3.84 MIL/uL — ABNORMAL LOW (ref 3.87–5.11)
RDW: 13.7 % (ref 11.5–15.5)
WBC: 7.2 10*3/uL (ref 4.0–10.5)

## 2011-01-16 LAB — PROTIME-INR
INR: 1.13 (ref 0.00–1.49)
Prothrombin Time: 14.7 seconds (ref 11.6–15.2)

## 2011-01-16 MED ORDER — WARFARIN SODIUM 5 MG PO TABS
5.0000 mg | ORAL_TABLET | Freq: Once | ORAL | Status: AC
  Administered 2011-01-16: 5 mg via ORAL
  Filled 2011-01-16: qty 1

## 2011-01-16 MED ORDER — ENOXAPARIN SODIUM 80 MG/0.8ML ~~LOC~~ SOLN
70.0000 mg | Freq: Two times a day (BID) | SUBCUTANEOUS | Status: DC
  Administered 2011-01-16 – 2011-01-18 (×4): 70 mg via SUBCUTANEOUS
  Filled 2011-01-16 (×7): qty 0.8

## 2011-01-16 MED ORDER — COLESTIPOL HCL 1 G PO TABS
1.0000 g | ORAL_TABLET | Freq: Every day | ORAL | Status: DC
  Administered 2011-01-16: 1 g via ORAL
  Filled 2011-01-16 (×3): qty 1

## 2011-01-16 NOTE — Progress Notes (Signed)
Patient ID: Debra Lynn, female   DOB: [birth year], 77 y.o.   MRN: [id number] 1 Day Post-Op  Subjective: POD#1 s/p right mastectomy Comfortable Drain output stable  Objective: Vital signs in last 24 hours: Temp:  [97.4 F (36.3 C)-98.2 F (36.8 C)] 98 F (36.7 C) (12/15 0958) Pulse Rate:  [59-67] 67  (12/15 0958) Resp:  [15-19] 18  (12/15 0958) BP: (87-141)/(47-67) 113/59 mmHg (12/15 0958) SpO2:  [90 %-100 %] 100 % (12/15 0958) Weight:  [154 lb 15.7 oz (70.3 kg)] 154 lb 15.7 oz (70.3 kg) (12/15 0300) Last BM Date: 01/14/11  Intake/Output this shift: Total I/O In: 360 [P.O.:360] Out: 500 [Urine:500]  Physical Exam: BP 113/59  Pulse 67  Temp(Src) 98 F (36.7 C) (Oral)  Resp 18  Ht 5' 2.5" (1.588 m)  Wt 154 lb 15.7 oz (70.3 kg)  BMI 27.90 kg/m2  SpO2 100% Dressing to right chest dry and intact Drain serosang  Labs: CBC  Basename 01/16/11 0500  WBC 7.2  HGB 11.1*  HCT 32.8*  PLT 153   BMET  Basename 01/16/11 0500  NA 135  K 3.9  CL 99  CO2 26  GLUCOSE 108*  BUN 15  CREATININE 0.74  CALCIUM 8.9   LFT No results found for this basename: PROT,ALBUMIN,AST,ALT,ALKPHOS,BILITOT,BILIDIR,IBILI,LIPASE in the last 72 hours PT/INR  Basename 01/16/11 0500  LABPROT 14.7  INR 1.13   ABG No results found for this basename: PHART:2,PCO2:2,PO2:2,HCO3:2 in the last 72 hours  Studies/Results: No results found.  Assessment: Active Problems:  * No active hospital problems. *    Procedure(s): SIMPLE MASTECTOMY  Plan: Patient started on coumadin Ok from surgery standpoint to bridge with lovenox  LOS: 1 day    Ilir Mahrt A 01/16/2011

## 2011-01-16 NOTE — Progress Notes (Signed)
ANTICOAGULATION CONSULT NOTE - Initial Consult  Pharmacy Consult for Lovenox & Coumadin Indication: atrial fibrillation (bridging)  Assessment: 77 yo F on coumadin PTA for afib & hx/o CVA, now s/p right mastectomy.  She was bridged with lovenox 70mg  SQ q12h pre-op, and is now restarting coumadin with post-op bridging.  CHADS2 score=4.  INR today is subtherapeutic (1.13). No bleeding issues noted.  Goal of Therapy:  INR 2-3   Plan:  1. Lovenox 70mg  SQ q12h.  Discontinue once INR >2 2. Coumadin 5mg  PO x1 today.  Check INR in AM.  Ival Bible 01/16/2011,1:01 PM  No Known Allergies  Patient Measurements: Height: 5' 2.5" (158.8 cm) (copied forward from last filed) Weight: 154 lb 15.7 oz (70.3 kg) (copied forward from last filed) IBW/kg (Calculated) : 51.25   Vital Signs: Temp: 98 F (36.7 C) (12/15 0958) Temp src: Oral (12/15 0958) BP: 113/59 mmHg (12/15 0958) Pulse Rate: 67  (12/15 0958)  Labs:  Basename 01/16/11 0500  HGB 11.1*  HCT 32.8*  PLT 153  APTT --  LABPROT 14.7  INR 1.13  HEPARINUNFRC --  CREATININE 0.74  CKTOTAL --  CKMB --  TROPONINI --   Estimated Creatinine Clearance: 52.2 ml/min (by C-G formula based on Cr of 0.74).  Medical History: Past Medical History  Diagnosis Date  . Sick sinus syndrome     with pacemaker and ablation  . Stroke 2010  . Glaucoma   . Seasonal allergic rhinitis     Pos Skin Test 07-05-07  . GERD (gastroesophageal reflux disease)   . Atrial fibrillation   . Hypertension   . Hyperlipidemia   . Cancer bREAST CA(12/2010)  . Chronic kidney disease TUMOR ON RIGHT KIDNEY-OBSERVING  . Arthritis KNEES &BACK  . Neuromuscular disorder SLIGHT TREMOR.    Medications:  Scheduled:    . amLODipine  2.5 mg Oral Daily  . calcium-vitamin D  1 tablet Oral Daily  . ceFAZolin (ANCEF) IV  1 g Intravenous 60 min Pre-Op  . ceFAZolin (ANCEF) IV  2 g Intravenous 60 min Pre-Op  . colestipol  1 g Oral Q1500  . cycloSPORINE  1 drop Both  Eyes BID  . docusate sodium  100 mg Oral BID  . hydrochlorothiazide  25 mg Oral Daily  . losartan  100 mg Oral Daily  . mulitivitamin with minerals  1 tablet Oral Daily  . nitrofurantoin  100 mg Oral BID  . pantoprazole  40 mg Oral Q1200  . potassium chloride SA  20 mEq Oral Daily  . propranolol  60 mg Oral QHS  . senna  1 tablet Oral BID  . warfarin  4 mg Oral Once  . DISCONTD: calcium-vitamin D  1 tablet Oral Daily  . DISCONTD: CENTRUM SILVER  1 tablet Oral Daily  . DISCONTD: losartan-hydrochlorothiazide  1 tablet Oral Daily  . DISCONTD: multivitamins ther. w/minerals  1 tablet Oral Daily  . DISCONTD: warfarin  2.5 mg Oral q1800

## 2011-01-17 LAB — PROTIME-INR
INR: 1.12 (ref 0.00–1.49)
Prothrombin Time: 14.6 seconds (ref 11.6–15.2)

## 2011-01-17 MED ORDER — ENOXAPARIN SODIUM 80 MG/0.8ML ~~LOC~~ SOLN: 70.0000 mg | Freq: Two times a day (BID) | SUBCUTANEOUS | Status: DC

## 2011-01-17 MED ORDER — WARFARIN SODIUM 7.5 MG PO TABS
7.5000 mg | ORAL_TABLET | Freq: Once | ORAL | Status: AC
  Administered 2011-01-17: 7.5 mg via ORAL
  Filled 2011-01-17: qty 1

## 2011-01-17 MED ORDER — HYDROCODONE-ACETAMINOPHEN 5-500 MG PO TABS
1.0000 | ORAL_TABLET | ORAL | Status: DC | PRN
Start: 2011-01-17 — End: 2011-02-08

## 2011-01-17 MED ORDER — HYDROCODONE-ACETAMINOPHEN 5-325 MG PO TABS
1.0000 | ORAL_TABLET | ORAL | Status: DC | PRN
  Administered 2011-01-17: 1 via ORAL
  Filled 2011-01-17: qty 1

## 2011-01-17 NOTE — Progress Notes (Signed)
ANTICOAGULATION CONSULT NOTE - Follow Up Consult  Pharmacy Consult for Coumadin  Indication: atrial fibrillation (bridging w/ Lovenox)  Assessment: 77 yo F on coumadin PTA for afib & hx/o CVA, now s/p right mastectomy. INR today remains subtherapeutic (1.12), continues on lovenox bridging until INR >2. No bleeding issues noted.  Plan: Coumadin 7.5mg  PO x1 today. Check INR in AM.  Ival Bible 01/17/2011,7:35 AM

## 2011-01-17 NOTE — Progress Notes (Signed)
2 Days Post-Op  Subjective: Does not want to take anymore Percocet.  Pain controlled with Tylenol.   Eager to go home today. Objective: Vital signs in last 24 hours: Temp:  [97.8 F (36.6 C)-98.1 F (36.7 C)] 97.9 F (36.6 C) (12/16 0500) Pulse Rate:  [64-69] 67  (12/16 0500) Resp:  [18] 18  (12/16 0500) BP: (95-131)/(52-62) 131/60 mmHg (12/16 0500) SpO2:  [94 %-100 %] 95 % (12/16 0500) Last BM Date: 01/14/11  Intake/Output from previous day: 12/15 0701 - 12/16 0700 In: 1898.5 [P.O.:1120; I.V.:778.5] Out: 2243 [Urine:2150; Drains:93] Intake/Output this shift: Total I/O In: 550 [I.V.:550] Out: -   JP drainage - thin, serosanguinous WDWN in NAD Right breast - flaps viable, incision without any signs of infection  Lab Results:   Basename 01/16/11 0500  WBC 7.2  HGB 11.1*  HCT 32.8*  PLT 153   BMET  Basename 01/16/11 0500  NA 135  K 3.9  CL 99  CO2 26  GLUCOSE 108*  BUN 15  CREATININE 0.74  CALCIUM 8.9   PT/INR  Basename 01/17/11 0545 01/16/11 0500  LABPROT 14.6 14.7  INR 1.12 1.13   ABG No results found for this basename: PHART:2,PCO2:2,PO2:2,HCO3:2 in the last 72 hours  Studies/Results: No results found.  Anti-infectives: Anti-infectives     Start     Dose/Rate Route Frequency Ordered Stop   01/15/11 1945   ceFAZolin (ANCEF) IVPB 1 g/50 mL premix        1 g 100 mL/hr over 30 Minutes Intravenous 60 min pre-op 01/15/11 1932     01/14/11 1441   ceFAZolin (ANCEF) IVPB 2 g/50 mL premix        2 g 100 mL/hr over 30 Minutes Intravenous 60 min pre-op 01/14/11 1442 01/15/11 1459          Assessment/Plan: s/p Procedure(s): SIMPLE MASTECTOMY Discharge Will continue Lovenox bridge.  Dr. Dwain Sarna will arrange INR check later in the week, as well as drain removal in the office.  LOS: 2 days    Debra Lynn K. 01/17/2011

## 2011-01-18 ENCOUNTER — Telehealth (INDEPENDENT_AMBULATORY_CARE_PROVIDER_SITE_OTHER): Payer: Self-pay

## 2011-01-18 LAB — PROTIME-INR
INR: 1.25 (ref 0.00–1.49)
Prothrombin Time: 16 seconds — ABNORMAL HIGH (ref 11.6–15.2)

## 2011-01-18 MED ORDER — HYDROCODONE-ACETAMINOPHEN 5-325 MG PO TABS: 1.0000 | ORAL_TABLET | ORAL | Status: AC | PRN

## 2011-01-18 NOTE — Telephone Encounter (Signed)
Called the nurse Lillia Abed that is discharging the pt today per Dr Corliss Skains. I made the pt a f/u appt to see Dr Dwain Sarna this Friday 01-22-11 and advised pt to call Cardiologist office about the Coumadin check. The pt will need to get back in touch with the Coumadin clinic for them to follow her INR. Lillia Abed will notify the pt when discharge today./ [facility]

## 2011-01-18 NOTE — Progress Notes (Signed)
3 Days Post-Op   Subjective: Feeling better; able to ambulate yesterday.  Tolerating Vicodin  Objective: Vital signs in last 24 hours: Temp:  [97.6 F (36.4 C)-98 F (36.7 C)] 98 F (36.7 C) (12/17 0500) Pulse Rate:  [63-67] 63  (12/17 0500) Resp:  [18] 18  (12/17 0500) BP: (117-142)/(57-70) 142/58 mmHg (12/17 0500) SpO2:  [93 %-99 %] 99 % (12/17 0500) Last BM Date: 01/14/11  Intake/Output from previous day: 12/16 0701 - 12/17 0700 In: 1750 [P.O.:1200; I.V.:550] Out: 764 [Urine:700; Drains:64] Intake/Output this shift:    Flaps viable; serosanguinous drainage  Lab Results:   Basename 01/16/11 0500  WBC 7.2  HGB 11.1*  HCT 32.8*  PLT 153   BMET  Basename 01/16/11 0500  NA 135  K 3.9  CL 99  CO2 26  GLUCOSE 108*  BUN 15  CREATININE 0.74  CALCIUM 8.9   PT/INR  Basename 01/18/11 0600 01/17/11 0545  LABPROT 16.0* 14.6  INR 1.25 1.12   ABG No results found for this basename: PHART:2,PCO2:2,PO2:2,HCO3:2 in the last 72 hours  Studies/Results: No results found.  Anti-infectives: Anti-infectives     Start     Dose/Rate Route Frequency Ordered Stop   01/15/11 1945   ceFAZolin (ANCEF) IVPB 1 g/50 mL premix        1 g 100 mL/hr over 30 Minutes Intravenous 60 min pre-op 01/15/11 1932     01/14/11 1441   ceFAZolin (ANCEF) IVPB 2 g/50 mL premix        2 g 100 mL/hr over 30 Minutes Intravenous 60 min pre-op 01/14/11 1442 01/15/11 1459          Assessment/Plan: s/p Procedure(s): SIMPLE MASTECTOMY Discharge Lovenox until INR therapeutic Dr. Doreen Salvage nurse will set up follow-up.  LOS: 3 days    Akasia Ahmad K. 01/18/2011

## 2011-01-18 NOTE — Telephone Encounter (Signed)
Pharmacy called needing to know how much lovenox to give pt. Dr not pageable. Jake Samples from [facility] we would call back tomorrow. [phone].

## 2011-01-19 ENCOUNTER — Ambulatory Visit (INDEPENDENT_AMBULATORY_CARE_PROVIDER_SITE_OTHER): Payer: Medicare Other | Admitting: General Surgery

## 2011-01-19 ENCOUNTER — Encounter (INDEPENDENT_AMBULATORY_CARE_PROVIDER_SITE_OTHER): Payer: Self-pay | Admitting: General Surgery

## 2011-01-19 VITALS — BP 130/78 | HR 70 | Temp 98.2°F | Resp 20 | Ht 63.0 in | Wt 151.6 lb

## 2011-01-19 DIAGNOSIS — Z09 Encounter for follow-up examination after completed treatment for conditions other than malignant neoplasm: Secondary | ICD-10-CM

## 2011-01-19 NOTE — Progress Notes (Signed)
Subjective:     Patient ID: Debra Lynn, female   DOB: 06-08-1928, 77 y.o.   MRN: [id number]  HPI This is an 77 year old female who underwent a right simple mastectomy last week. She was discharged home on Monday on Lovenox and Coumadin for her atrial fibrillation. She returns today with some drainage around her drain that is bloody. She also some clots in the drain. She otherwise is doing very well without any significant complaints.  Review of Systems     Objective:   Physical Exam Healing mastectomy wound, jp with some bloody output and clots, swelling around pacemaker superiorly    Assessment:     S/p right simple mastectomy for T2 breast cancer    Plan:        I stripped her drain today and is working just fine now. She is going to get her INR checked today. I asked her to hold her Lovenox dose tonight no matter what. We also discussed her pathology which is a 2.2 cm right breast cancer that has been completely excised. I will see her back on Friday to reevaluate.

## 2011-01-20 NOTE — Discharge Summary (Signed)
Physician Discharge Summary  Patient ID: ALAJA Lynn MRN: [id number] DOB/AGE: 08/04/1928 77 y.o.  Admit date: 01/15/2011 Discharge date: 01/20/2011  Admission Diagnoses: Right breast cancer History CVA CHF Afib  Discharge Diagnoses:  Active Problems:  * No active hospital problems. *    Discharged Condition: fair  Hospital Course: 77 yof with newly diagnosed right breast cancer seen in multidisciplinary clinic.  We discussed a right simple mastectomy through a lovenox window.  She underwent right simple mastectomy for a pathologic T2 breast cancer.  Postoperatively she had good pain control and was tolerating a regular diet.  She was slow to mobilize but was able to do this on discharge.  She was restarted on coumadin and when inr did not increase appropriately she was begun on lovenox.  She was discharged home with close follow up.  Consults: none  Significant Diagnostic Studies: labs: INR daily  Treatments: surgery: Right simple mastectomy   Disposition: Home or Self Care  Discharge Orders    Future Appointments: Provider: Department: Dept Phone: Center:   01/22/2011 2:40 PM Emelia Loron, MD [facility] [phone] None   02/09/2011 10:30 AM [facility] Dexa 1 [facility] [phone] [facility]   02/22/2011 2:30 PM Lowella Dell, MD [facility] [phone] None   05/07/2011 1:30 PM Waymon Budge, MD [facility] [phone] None     Future Orders Please Complete By Expires   Diet general      Diet general      Discharge instructions      Comments:   [facility], [place] [phone]  MASTECTOMY: POST OP INSTRUCTIONS  Always review your discharge instruction sheet given to you by the facility where your surgery was performed. IF YOU HAVE DISABILITY OR FAMILY LEAVE FORMS, YOU MUST BRING THEM TO THE OFFICE FOR PROCESSING.   DO NOT GIVE THEM TO YOUR DOCTOR. A prescription for pain medication may be given to you upon discharge.   Take your pain medication as prescribed, if needed.  If narcotic pain medicine is not needed, then you may take acetaminophen (Tylenol) or ibuprofen (Advil) as needed. Take your usually prescribed medications unless otherwise directed. If you need a refill on your pain medication, please contact your pharmacy.  They will contact our office to request authorization.  Prescriptions will not be filled after 5pm or on week-ends. You should follow a light diet the first few days after arrival home, such as soup and crackers, etc.  Resume your normal diet the day after surgery. Most patients will experience some swelling and bruising on the chest and underarm.  Ice packs will help.  Swelling and bruising can take several days to resolve.  It is common to experience some constipation if taking pain medication after surgery.  Increasing fluid intake and taking a stool softener (such as Colace) will usually help or prevent this problem from occurring.  A mild laxative (Milk of Magnesia or Miralax) should be taken according to package instructions if there are no bowel movements after 48 hours. Unless discharge instructions indicate otherwise, leave your bandage dry and in place until your next appointment in 3-5 days.  You may take a limited sponge bath.  No tube baths or showers until the drains are removed.  You may have steri-strips (small skin tapes) in place directly over the incision.  These strips should be left on the skin for 7-10 days.  If your surgeon used skin glue on the incision, you may shower in 24 hours.  The glue will flake off over the next 2-3 weeks.  Any sutures or staples will be removed at the office during your follow-up visit. DRAINS:  If you have drains in place, it is important to keep a list of the amount of drainage produced each day in your drains.  Before leaving the hospital, you should be instructed on drain care.  Call our office if you have any questions about your  drains. ACTIVITIES:  You may resume regular (light) daily activities beginning the next day-such as daily self-care, walking, climbing stairs-gradually increasing activities as tolerated.  You may have sexual intercourse when it is comfortable.  Refrain from any heavy lifting or straining until approved by your doctor. You may drive when you are no longer taking prescription pain medication, you can comfortably wear a seatbelt, and you can safely maneuver your car and apply brakes. RETURN TO WORK:  __________________________________________________________ Bonita Quin should see your doctor in the office for a follow-up appointment approximately 3-5 days after your surgery.  Your doctor's nurse will typically make your follow-up appointment when she calls you with your pathology report.  Expect your pathology report 2-3 business days after your surgery.  You may call to check if you do not hear from [place] after three days.   OTHER INSTRUCTIONS: ______________________________________________________________________________________________ ____________________________________________________________________________________________ WHEN TO CALL YOUR DOCTOR: Fever over 101.0 Nausea and/or vomiting Extreme swelling or bruising Continued bleeding from incision. Increased pain, redness, or drainage from the incision. The clinic staff is available to answer your questions during regular business hours.  Please don't hesitate to call and ask to speak to one of the nurses for clinical concerns.  If you have a medical emergency, go to the nearest emergency room or call 911.  A surgeon from [facility] is always on call at the hospital. [address], [address], [place], [place]  [address] ? [address] [address], [place], [place]   [address] [phone]  FAX [phone]   Increase activity slowly      May walk up steps      Driving Restrictions      Comments:   Do not drive while taking pain  medications   Call MD for:  temperature >100.4      Call MD for:  persistant nausea and vomiting      Call MD for:  severe uncontrolled pain      Call MD for:  redness, tenderness, or signs of infection (pain, swelling, redness, odor or green/yellow discharge around incision site)      Nursing communication      Comments:   Instruct patient on JP care - empty and record output BID Change drain dressing prior to discharge.   Increase activity slowly      May walk up steps      May shower / Bathe      Driving Restrictions      Comments:   Do not drive while taking pain medications   Call MD for:  temperature >100.4      Call MD for:  persistant nausea and vomiting      Call MD for:  severe uncontrolled pain      Call MD for:  redness, tenderness, or signs of infection (pain, swelling, redness, odor or green/yellow discharge around incision site)        Medication List  As of 01/20/2011  6:10 PM   START taking these medications         *  enoxaparin 80 MG/0.8ML Soln injection   Commonly known as: LOVENOX   Inject 0.7 mLs (70 mg total) into the skin every 12 (twelve) hours.      * HYDROcodone-acetaminophen 5-500 MG per tablet   Commonly known as: VICODIN   Take 1 tablet by mouth every 4 (four) hours as needed for pain.      * HYDROcodone-acetaminophen 5-325 MG per tablet   Commonly known as: NORCO   Take 1-2 tablets by mouth every 4 (four) hours as needed.     * Notice: This list has 3 medication(s) that are the same as other medications prescribed for you. Read the directions carefully, and ask your doctor or other care provider to review them with you.       CONTINUE taking these medications         acetaminophen 500 MG tablet   Commonly known as: TYLENOL      amLODipine 2.5 MG tablet   Commonly known as: NORVASC      calcium-vitamin D 500-200 MG-UNIT per tablet      CENTRUM SILVER tablet      cetirizine 10 MG tablet   Commonly known as: ZYRTEC      * enoxaparin 80  MG/0.8ML Soln injection   Commonly known as: LOVENOX      Fish Oil 1000 MG Caps      losartan-hydrochlorothiazide 100-25 MG per tablet   Commonly known as: HYZAAR      MICRONIZED COLESTIPOL HCL 1 G tablet   Generic drug: colestipol      nitrofurantoin 100 MG capsule   Commonly known as: MACRODANTIN      pantoprazole 40 MG tablet   Commonly known as: PROTONIX      potassium chloride SA 20 MEQ tablet   Commonly known as: K-DUR,KLOR-CON      pravastatin 40 MG tablet   Commonly known as: PRAVACHOL      propranolol 60 MG 24 hr capsule   Commonly known as: INDERAL LA      RESTASIS 0.05 % ophthalmic emulsion   Generic drug: cycloSPORINE      warfarin 2.5 MG tablet   Commonly known as: COUMADIN     * Notice: This list has 1 medication(s) that are the same as other medications prescribed for you. Read the directions carefully, and ask your doctor or other care provider to review them with you.        Where to get your medications    These are the prescriptions that you need to pick up. We sent them to a specific pharmacy, so you will need to go there to get them.   [id number] [place], [place] - [address]    [address] [place] [address]    Phone: [phone]        enoxaparin 80 MG/0.8ML Soln injection         You may get these medications from any pharmacy.         HYDROcodone-acetaminophen 5-325 MG per tablet   HYDROcodone-acetaminophen 5-500 MG per tablet           Follow-up Information    Follow up with [facility], MD in 1 week. (will call with appointment)    Contact information:   [facility] [address] [place] [address] [phone]          Signed: Emelia Loron 01/20/2011, 6:10 PM

## 2011-01-22 ENCOUNTER — Ambulatory Visit (INDEPENDENT_AMBULATORY_CARE_PROVIDER_SITE_OTHER): Payer: Medicare Other | Admitting: General Surgery

## 2011-01-22 ENCOUNTER — Ambulatory Visit (INDEPENDENT_AMBULATORY_CARE_PROVIDER_SITE_OTHER): Payer: Medicare Other

## 2011-01-22 ENCOUNTER — Encounter (INDEPENDENT_AMBULATORY_CARE_PROVIDER_SITE_OTHER): Payer: Self-pay | Admitting: General Surgery

## 2011-01-22 VITALS — BP 118/68 | HR 70 | Temp 97.8°F | Resp 18 | Ht 62.0 in | Wt 156.0 lb

## 2011-01-22 DIAGNOSIS — J309 Allergic rhinitis, unspecified: Secondary | ICD-10-CM

## 2011-01-22 DIAGNOSIS — Z09 Encounter for follow-up examination after completed treatment for conditions other than malignant neoplasm: Secondary | ICD-10-CM

## 2011-01-22 NOTE — Progress Notes (Signed)
Subjective:     Patient ID: Debra Lynn, female   DOB: 04-29-1928, 77 y.o.   MRN: [id number]  HPI This is an 77 year old female underwent a right simple mastectomy on. She has had some trouble with her drain and I saw her earlier this week and we also discussed her pathology. She comes back today just for a drain check. Drainage has decreased. They don't have an accurate measure for drainage today ago. She is otherwise doing well. She is now off the Lovenox just on Coumadin.  Review of Systems     Objective:   Physical Exam Hematoma on superior flap and some mild ecchymosis throughout, wound clean without infection    Assessment:     T2 breast cancer s/p right mastectomy    Plan:     I am not going to remove her drain yet. She is to come back on Monday and have this removed. I asked her to stop wearing the breast binder now are brought. She has some ecchymosis around her incision is well as was swelling up near her pacemaker should resolve over some time.

## 2011-01-25 ENCOUNTER — Ambulatory Visit (INDEPENDENT_AMBULATORY_CARE_PROVIDER_SITE_OTHER): Payer: Medicare Other | Admitting: Surgery

## 2011-01-25 ENCOUNTER — Encounter (INDEPENDENT_AMBULATORY_CARE_PROVIDER_SITE_OTHER): Payer: Self-pay | Admitting: Surgery

## 2011-01-25 VITALS — BP 126/72 | HR 68 | Temp 97.6°F | Resp 18 | Ht 62.0 in | Wt 151.6 lb

## 2011-01-25 DIAGNOSIS — C50219 Malignant neoplasm of upper-inner quadrant of unspecified female breast: Secondary | ICD-10-CM

## 2011-01-25 NOTE — Progress Notes (Signed)
Drain removed per Dr. Pablo Ledger request.  Minimal drainage over the last two days.  When the drain was removed, a large amount of old thin blood was expressed.  The flaps appear viable with no accumulation under the flaps.  She still has much ecchymosis around her pacemaker.  Follow-up with Dr. Mauri Reading next week as scheduled.  Change the dressing over the drain site PRN.  Wilmon Arms. Corliss Skains, MD, [facility]  01/25/2011 11:31 AM

## 2011-01-25 NOTE — Patient Instructions (Signed)
Dry gauze over the drain site.  Change as needed.  Keep your appointment with Dr. Dwain Sarna.

## 2011-01-29 ENCOUNTER — Ambulatory Visit (INDEPENDENT_AMBULATORY_CARE_PROVIDER_SITE_OTHER): Payer: Medicare Other

## 2011-01-29 DIAGNOSIS — J309 Allergic rhinitis, unspecified: Secondary | ICD-10-CM

## 2011-02-01 ENCOUNTER — Ambulatory Visit (INDEPENDENT_AMBULATORY_CARE_PROVIDER_SITE_OTHER): Payer: Medicare Other

## 2011-02-01 DIAGNOSIS — J309 Allergic rhinitis, unspecified: Secondary | ICD-10-CM

## 2011-02-02 DIAGNOSIS — R569 Unspecified convulsions: Secondary | ICD-10-CM

## 2011-02-02 HISTORY — DX: Unspecified convulsions: R56.9

## 2011-02-03 ENCOUNTER — Ambulatory Visit (INDEPENDENT_AMBULATORY_CARE_PROVIDER_SITE_OTHER): Payer: Medicare Other | Admitting: General Surgery

## 2011-02-03 ENCOUNTER — Encounter (INDEPENDENT_AMBULATORY_CARE_PROVIDER_SITE_OTHER): Payer: Self-pay | Admitting: General Surgery

## 2011-02-03 ENCOUNTER — Encounter: Payer: Self-pay | Admitting: *Deleted

## 2011-02-03 VITALS — BP 116/78 | HR 80 | Temp 97.8°F | Resp 18 | Ht 62.0 in | Wt 153.2 lb

## 2011-02-03 DIAGNOSIS — Z09 Encounter for follow-up examination after completed treatment for conditions other than malignant neoplasm: Secondary | ICD-10-CM

## 2011-02-03 NOTE — Progress Notes (Unsigned)
Received message from pt daughter, Harle Battiest stating her mother has 2 bone density scans scheduled.  She requested to keep bone density on 02/16/11 with [facility] and to cancel appt with [facility] on 02/09/11.  Called pt daughter and informed that bone density scan has been cancelled with [facility]

## 2011-02-03 NOTE — Progress Notes (Signed)
Subjective:     Patient ID: Debra Lynn, female   DOB: [birth year], 78 y.o.   MRN: [id number]  HPI 73 yof s/p right simple mastectomy.  Drain removed last week.  She is doing well without any complaints.  Right arm somewhat limited secondary to soreness.  Review of Systems     Objective:   Physical Exam Ecchymosis over flap and around pacemaker, no infection, no retained fluid or hematoma    Assessment:     S/p right mastectomy    Plan:     PT referral Post mastectomy supplies Discussed bruising and swelling should get better over next few weeks. Will see back in 3-4 weeks.

## 2011-02-05 ENCOUNTER — Ambulatory Visit (INDEPENDENT_AMBULATORY_CARE_PROVIDER_SITE_OTHER): Payer: Medicare Other

## 2011-02-05 DIAGNOSIS — J309 Allergic rhinitis, unspecified: Secondary | ICD-10-CM

## 2011-02-08 ENCOUNTER — Ambulatory Visit (INDEPENDENT_AMBULATORY_CARE_PROVIDER_SITE_OTHER): Payer: Medicare Other | Admitting: General Surgery

## 2011-02-08 ENCOUNTER — Inpatient Hospital Stay (HOSPITAL_COMMUNITY)
Admission: AD | Admit: 2011-02-08 | Discharge: 2011-02-10 | DRG: 863 | Disposition: A | Discharge status: Home / Self Care | Payer: Medicare Other | Source: Ambulatory Visit | Attending: General Surgery | Admitting: General Surgery

## 2011-02-08 ENCOUNTER — Other Ambulatory Visit (INDEPENDENT_AMBULATORY_CARE_PROVIDER_SITE_OTHER): Payer: Self-pay | Admitting: General Surgery

## 2011-02-08 ENCOUNTER — Encounter (INDEPENDENT_AMBULATORY_CARE_PROVIDER_SITE_OTHER): Payer: Self-pay | Admitting: Surgery

## 2011-02-08 VITALS — BP 108/66 | HR 84 | Temp 98.0°F | Ht 62.0 in | Wt 151.6 lb

## 2011-02-08 DIAGNOSIS — T8140XA Infection following a procedure, unspecified, initial encounter: Secondary | ICD-10-CM

## 2011-02-08 DIAGNOSIS — Z8673 Personal history of transient ischemic attack (TIA), and cerebral infarction without residual deficits: Secondary | ICD-10-CM

## 2011-02-08 DIAGNOSIS — Z7901 Long term (current) use of anticoagulants: Secondary | ICD-10-CM

## 2011-02-08 DIAGNOSIS — Z79899 Other long term (current) drug therapy: Secondary | ICD-10-CM

## 2011-02-08 DIAGNOSIS — I509 Heart failure, unspecified: Secondary | ICD-10-CM | POA: Diagnosis present

## 2011-02-08 DIAGNOSIS — I129 Hypertensive chronic kidney disease with stage 1 through stage 4 chronic kidney disease, or unspecified chronic kidney disease: Secondary | ICD-10-CM | POA: Diagnosis present

## 2011-02-08 DIAGNOSIS — Z853 Personal history of malignant neoplasm of breast: Secondary | ICD-10-CM

## 2011-02-08 DIAGNOSIS — N189 Chronic kidney disease, unspecified: Secondary | ICD-10-CM | POA: Diagnosis present

## 2011-02-08 DIAGNOSIS — H409 Unspecified glaucoma: Secondary | ICD-10-CM | POA: Diagnosis present

## 2011-02-08 DIAGNOSIS — K219 Gastro-esophageal reflux disease without esophagitis: Secondary | ICD-10-CM | POA: Diagnosis present

## 2011-02-08 DIAGNOSIS — Z950 Presence of cardiac pacemaker: Secondary | ICD-10-CM

## 2011-02-08 DIAGNOSIS — E785 Hyperlipidemia, unspecified: Secondary | ICD-10-CM | POA: Diagnosis present

## 2011-02-08 DIAGNOSIS — I4891 Unspecified atrial fibrillation: Secondary | ICD-10-CM | POA: Diagnosis present

## 2011-02-08 DIAGNOSIS — Y836 Removal of other organ (partial) (total) as the cause of abnormal reaction of the patient, or of later complication, without mention of misadventure at the time of the procedure: Secondary | ICD-10-CM | POA: Diagnosis present

## 2011-02-08 DIAGNOSIS — M129 Arthropathy, unspecified: Secondary | ICD-10-CM | POA: Diagnosis present

## 2011-02-08 LAB — PROTIME-INR
INR: 2.1 — ABNORMAL HIGH (ref 0.00–1.49)
Prothrombin Time: 23.9 seconds — ABNORMAL HIGH (ref 11.6–15.2)

## 2011-02-08 LAB — CBC
HCT: 29.4 % — ABNORMAL LOW (ref 36.0–46.0)
Hemoglobin: 10 g/dL — ABNORMAL LOW (ref 12.0–15.0)
MCH: 29.1 pg (ref 26.0–34.0)
MCHC: 34 g/dL (ref 30.0–36.0)
MCV: 85.5 fL (ref 78.0–100.0)
Platelets: 241 10*3/uL (ref 150–400)
RBC: 3.44 MIL/uL — ABNORMAL LOW (ref 3.87–5.11)
RDW: 13.9 % (ref 11.5–15.5)
WBC: 14.9 10*3/uL — ABNORMAL HIGH (ref 4.0–10.5)

## 2011-02-08 LAB — BASIC METABOLIC PANEL
BUN: 22 mg/dL (ref 6–23)
CO2: 25 mEq/L (ref 19–32)
Calcium: 9.8 mg/dL (ref 8.4–10.5)
Chloride: 98 mEq/L (ref 96–112)
Creatinine, Ser: 1.02 mg/dL (ref 0.50–1.10)
GFR calc Af Amer: 59 mL/min — ABNORMAL LOW (ref >90)
GFR calc non Af Amer: 51 mL/min — ABNORMAL LOW (ref >90)
Glucose, Bld: 117 mg/dL — ABNORMAL HIGH (ref 70–99)
Potassium: 3.1 mEq/L — ABNORMAL LOW (ref 3.5–5.1)
Sodium: 135 mEq/L (ref 135–145)

## 2011-02-08 LAB — DIFFERENTIAL
Basophils Absolute: 0 10*3/uL (ref 0.0–0.1)
Basophils Relative: 0 % (ref 0–1)
Eosinophils Absolute: 0.2 10*3/uL (ref 0.0–0.7)
Eosinophils Relative: 1 % (ref 0–5)
Lymphocytes Relative: 15 % (ref 12–46)
Lymphs Abs: 2.2 10*3/uL (ref 0.7–4.0)
Monocytes Absolute: 1.3 10*3/uL — ABNORMAL HIGH (ref 0.1–1.0)
Monocytes Relative: 9 % (ref 3–12)
Neutro Abs: 11.2 10*3/uL — ABNORMAL HIGH (ref 1.7–7.7)
Neutrophils Relative %: 75 % (ref 43–77)

## 2011-02-08 MED ORDER — DEXTROSE-NACL 5-0.9 % IV SOLN
INTRAVENOUS | Status: DC
  Administered 2011-02-08: 20:00:00 via INTRAVENOUS

## 2011-02-08 MED ORDER — PROPRANOLOL HCL ER 60 MG PO CP24: 60.0000 mg | ORAL_CAPSULE | Freq: Every day | ORAL | Status: DC

## 2011-02-08 MED ORDER — ACETAMINOPHEN 325 MG PO TABS: 650.0000 mg | ORAL_TABLET | Freq: Four times a day (QID) | ORAL | Status: DC | PRN

## 2011-02-08 MED ORDER — HYDROCODONE-ACETAMINOPHEN 5-325 MG PO TABS
1.0000 | ORAL_TABLET | ORAL | Status: DC | PRN
  Administered 2011-02-08 – 2011-02-09 (×4): 1 via ORAL
  Filled 2011-02-08: qty 1
  Filled 2011-02-08 (×3): qty 2

## 2011-02-08 MED ORDER — SODIUM CHLORIDE 0.9 % IV SOLN
3.0000 g | Freq: Four times a day (QID) | INTRAVENOUS | Status: DC
  Administered 2011-02-08 – 2011-02-10 (×7): 3 g via INTRAVENOUS
  Filled 2011-02-08 (×9): qty 3

## 2011-02-08 MED ORDER — ONDANSETRON HCL 4 MG/2ML IJ SOLN: 4.0000 mg | Freq: Four times a day (QID) | INTRAMUSCULAR | Status: DC | PRN

## 2011-02-08 MED ORDER — MORPHINE SULFATE 2 MG/ML IJ SOLN: 2.0000 mg | INTRAMUSCULAR | Status: DC | PRN

## 2011-02-08 MED ORDER — SODIUM CHLORIDE 0.9 % IV SOLN: INTRAVENOUS | Status: DC

## 2011-02-08 MED ORDER — AMLODIPINE BESYLATE 2.5 MG PO TABS: 2.5000 mg | ORAL_TABLET | Freq: Every day | ORAL | Status: DC

## 2011-02-08 MED ORDER — SODIUM CHLORIDE 0.9 % IV SOLN: 3.0000 g | Freq: Four times a day (QID) | INTRAVENOUS | Status: DC

## 2011-02-08 MED ORDER — HYDROCODONE-ACETAMINOPHEN 5-325 MG PO TABS: 1.0000 | ORAL_TABLET | ORAL | Status: DC | PRN

## 2011-02-08 NOTE — Progress Notes (Signed)
Patient ID: Debra Lynn, female   DOB: [birth year], 78 y.o.   MRN: [id number]  Chief Complaint  Patient presents with  . Follow-up    reck R br mast..incision bleeding    HPI Debra Lynn is a 78 y.o. female.   HPI 51 yof s/p right simple mastectomy who did well and was back on anticoagulation.  Today she began having drainage from lateral portion of wound or old drain site.  She also has had increasing redness at flaps. Denies fevers.  She does not feel well.  Past Medical History  Diagnosis Date  . Sick sinus syndrome     with pacemaker and ablation  . Stroke 2010  . Glaucoma   . Seasonal allergic rhinitis     Pos Skin Test 07-05-07  . GERD (gastroesophageal reflux disease)   . Atrial fibrillation   . Hypertension   . Hyperlipidemia   . Cancer bREAST CA(12/2010)  . Chronic kidney disease TUMOR ON RIGHT KIDNEY-OBSERVING  . Arthritis KNEES &BACK  . Neuromuscular disorder SLIGHT TREMOR.  . Bleeding from breast     right    Past Surgical History  Procedure Date  . Cholecystectomy   . Vesicovaginal fistula closure w/ tah   . Atrial ablation surgery   . Flexible bronchoscopy w/ upper endoscopy     and swallowing evaluation  . Pacemaker insertion   . Knee arthroscopy   . Cataract extraction   . Insert / replace / remove pacemaker 01/2007    FOLLOWED BY [facility]  . Abdominal hysterectomy 1975    pt. unsure if laparascopic/vaginal/abdominal  . Breast surgery 01/2011    mastectomy    Family History  Problem Relation Age of Onset  . Asthma Brother   . Asthma Daughter   . Heart disease    . Cancer    . Heart failure Mother   . Cancer Father     brain  . Lymphoma Brother   . Diabetes Brother   . Hypertension Brother     Social History History  Substance Use Topics  . Smoking status: Never Smoker   . Smokeless tobacco: Never Used  . Alcohol Use: No    No Known Allergies  Current Outpatient Prescriptions  Medication Sig Dispense Refill  .  acetaminophen (TYLENOL) 500 MG tablet Take 500 mg by mouth every 6 (six) hours as needed. For pain.      Marland Kitchen amLODipine (NORVASC) 2.5 MG tablet Take 1 tablet by mouth daily.      . Calcium Carbonate-Vitamin D (CALCIUM-VITAMIN D) 500-200 MG-UNIT per tablet Take 1 tablet by mouth daily.        . cetirizine (ZYRTEC) 10 MG tablet Take 10 mg by mouth daily as needed. For allergies      . losartan-hydrochlorothiazide (HYZAAR) 100-25 MG per tablet Take 1 tablet by mouth daily.      Marland Kitchen MICRONIZED COLESTIPOL HCL 1 G tablet Take 1 tablet by mouth daily.      . Multiple Vitamins-Minerals (CENTRUM SILVER) tablet Take 1 tablet by mouth daily.        . pantoprazole (PROTONIX) 40 MG tablet Take 1 tablet by mouth daily.      . potassium chloride SA (K-DUR,KLOR-CON) 20 MEQ tablet Take 1 tablet by mouth daily.      . pravastatin (PRAVACHOL) 40 MG tablet Take 1 tablet by mouth daily.      . propranolol (INDERAL LA) 60 MG 24 hr capsule Take 1 tablet by mouth Twice  daily.      . RESTASIS 0.05 % ophthalmic emulsion Place 1 drop into both eyes every 12 (twelve) hours.       Marland Kitchen warfarin (COUMADIN) 2.5 MG tablet Take by mouth daily. Take as directed      . HYDROcodone-acetaminophen (VICODIN) 5-500 MG per tablet Take 1 tablet by mouth every 4 (four) hours as needed for pain.  20 tablet  0    Review of Systems Review of Systems  Blood pressure 108/66, pulse 84, temperature 98 F (36.7 C), temperature source Temporal, height 5\' 2"  (1.575 m), weight 151 lb 9.6 oz (68.765 kg), SpO2 97.00%.  Physical Exam Physical Exam  Pulmonary/Chest:        Assessment    S/p right simple mastectomy with infected seroma/cellulitis    Plan    Admission, abx, hold coumadin, hopefully will resolve conservatively       Debra Lynn 02/08/2011, 5:21 PM

## 2011-02-09 ENCOUNTER — Other Ambulatory Visit: Payer: Medicare Other

## 2011-02-09 MED ORDER — CYCLOSPORINE 0.05 % OP EMUL
1.0000 [drp] | Freq: Two times a day (BID) | OPHTHALMIC | Status: DC
  Administered 2011-02-09 – 2011-02-10 (×3): 1 [drp] via OPHTHALMIC
  Filled 2011-02-09 (×5): qty 1

## 2011-02-09 MED ORDER — PROPRANOLOL HCL ER 60 MG PO CP24
60.0000 mg | ORAL_CAPSULE | Freq: Every day | ORAL | Status: DC
  Administered 2011-02-09: 60 mg via ORAL
  Filled 2011-02-09 (×2): qty 1

## 2011-02-09 MED ORDER — POTASSIUM CHLORIDE CRYS ER 20 MEQ PO TBCR
20.0000 meq | EXTENDED_RELEASE_TABLET | Freq: Every day | ORAL | Status: DC
  Administered 2011-02-09 – 2011-02-10 (×2): 20 meq via ORAL
  Filled 2011-02-09 (×2): qty 1

## 2011-02-09 MED ORDER — CALCIUM-VITAMIN D 500-200 MG-UNIT PO TABS
1.0000 | ORAL_TABLET | Freq: Two times a day (BID) | ORAL | Status: DC
  Administered 2011-02-09 – 2011-02-10 (×3): 1 via ORAL
  Filled 2011-02-09 (×5): qty 1

## 2011-02-09 MED ORDER — LOSARTAN POTASSIUM 50 MG PO TABS
100.0000 mg | ORAL_TABLET | Freq: Every day | ORAL | Status: DC
  Administered 2011-02-09 – 2011-02-10 (×2): 100 mg via ORAL
  Filled 2011-02-09 (×3): qty 2

## 2011-02-09 MED ORDER — HYDROCHLOROTHIAZIDE 25 MG PO TABS
25.0000 mg | ORAL_TABLET | Freq: Every day | ORAL | Status: DC
  Administered 2011-02-09 – 2011-02-10 (×2): 25 mg via ORAL
  Filled 2011-02-09 (×3): qty 1

## 2011-02-09 MED ORDER — WARFARIN 1.25 MG HALF TABLET
1.2500 mg | ORAL_TABLET | Freq: Once | ORAL | Status: AC
Start: 2011-02-09 — End: 2011-02-09
  Administered 2011-02-09: 1.25 mg via ORAL
  Filled 2011-02-09: qty 1

## 2011-02-09 MED ORDER — LOSARTAN POTASSIUM-HCTZ 100-25 MG PO TABS: 1.0000 | ORAL_TABLET | Freq: Every day | ORAL | Status: DC

## 2011-02-09 MED ORDER — PANTOPRAZOLE SODIUM 40 MG PO TBEC
40.0000 mg | DELAYED_RELEASE_TABLET | Freq: Every day | ORAL | Status: DC
  Administered 2011-02-09 – 2011-02-10 (×2): 40 mg via ORAL
  Filled 2011-02-09 (×2): qty 1

## 2011-02-09 NOTE — Progress Notes (Addendum)
ANTICOAGULATION CONSULT NOTE - Initial Consult  Pharmacy Consult for Coumadin Indication: atrial fibrillation  No Known Allergies  Vital Signs: Temp: 98.1 F (36.7 C) (01/08 0800) Temp src: Oral (01/08 0400) BP: 108/70 mmHg (01/08 0800) Pulse Rate: 81  (01/08 0800)  Labs:  Basename 02/08/11 2030  HGB 10.0*  HCT 29.4*  PLT 241  APTT --  LABPROT 23.9*  INR 2.10*  HEPARINUNFRC --  CREATININE 1.02  CKTOTAL --  CKMB --  TROPONINI --   The CrCl is unknown because both a height and weight (above a minimum accepted value) are required for this calculation.  Medical History: Past Medical History  Diagnosis Date  . Sick sinus syndrome     with pacemaker and ablation  . Stroke 2010  . Glaucoma   . Seasonal allergic rhinitis     Pos Skin Test 07-05-07  . GERD (gastroesophageal reflux disease)   . Atrial fibrillation   . Hypertension   . Hyperlipidemia   . Cancer bREAST CA(12/2010)  . Chronic kidney disease TUMOR ON RIGHT KIDNEY-OBSERVING  . Arthritis KNEES &BACK  . Neuromuscular disorder SLIGHT TREMOR.  . Bleeding from breast     right    Medications:  Scheduled:    . ampicillin-sulbactam (UNASYN) IV  3 g Intravenous Q6H  . calcium-vitamin D  1 tablet Oral BID WC  . cycloSPORINE  1 drop Both Eyes Q12H  . pantoprazole  40 mg Oral Daily  . potassium chloride SA  20 mEq Oral Daily    Assessment: 87 YOF with breast cancer s/p right simple mastectomy in 01/2011, admitted d/t flaps infection, s/p drain. Pt. On coumadin prior to admission for a-fib (home dose 2.5mg  on Mon/Wed/Fri, 1.25mg  on other days), INR therapeutic on admission yesterday PM (2030). Now to resume coumadin per Rx. No INR today, but likely around 2.0  Goal of Therapy:  INR 2-3   Plan:  1. Coumadin 1.25mg  po x 1 as home dose 2. F/u INR tomorrow AM  Riki Rusk 02/09/2011,10:53 AM

## 2011-02-09 NOTE — Progress Notes (Signed)
  Subjective: Feels somewhat better today  Objective: Vital signs in last 24 hours: Temp:  [98 F (36.7 C)-98.6 F (37 C)] 98.1 F (36.7 C) (01/08 0800) Pulse Rate:  [71-84] 81  (01/08 0800) Resp:  [18-24] 18  (01/08 0800) BP: (100-109)/(66-70) 108/70 mmHg (01/08 0800) SpO2:  [93 %-97 %] 93 % (01/08 0800) Weight:  [151 lb 9.6 oz (68.765 kg)] 151 lb 9.6 oz (68.765 kg) (01/07 1630) Last BM Date: 02/08/11  Intake/Output from previous day: 01/07 0701 - 01/08 0700 In: 240 [P.O.:240] Out: -  Intake/Output this shift: Total I/O In: -  Out: 200 [Urine:200]  Chest wall: no tenderness,   Lab Results:   [facility] 02/08/11 2030  WBC 14.9*  HGB 10.0*  HCT 29.4*  PLT 241   BMET  Basename 02/08/11 2030  NA 135  K 3.1*  CL 98  CO2 25  GLUCOSE 117*  BUN 22  CREATININE 1.02  CALCIUM 9.8   PT/INR  Basename 02/08/11 2030  LABPROT 23.9*  INR 2.10*   ABG No results found for this basename: PHART:2,PCO2:2,PO2:2,HCO3:2 in the last 72 hours  Studies/Results: No results found.  Anti-infectives: Anti-infectives     Start     Dose/Rate Route Frequency Ordered Stop   02/08/11 2100   Ampicillin-Sulbactam (UNASYN) 3 g in sodium chloride 0.9 % 100 mL IVPB        3 g 100 mL/hr over 60 Minutes Intravenous Every 6 hours 02/08/11 2002            Assessment/Plan: Flap infection  Po pain meds Cont abx I drained fair amount of fluid and packed wound today Check wbc tomorrow    LOS: 1 day    [facility] 02/09/2011

## 2011-02-09 NOTE — Progress Notes (Signed)
UR of chart completed. Potential need for HHRN at d/c to assist with dressing changes.

## 2011-02-10 LAB — PROTIME-INR
INR: 1.79 — ABNORMAL HIGH (ref 0.00–1.49)
Prothrombin Time: 21.1 seconds — ABNORMAL HIGH (ref 11.6–15.2)

## 2011-02-10 LAB — CBC
HCT: 30.5 % — ABNORMAL LOW (ref 36.0–46.0)
Hemoglobin: 10.1 g/dL — ABNORMAL LOW (ref 12.0–15.0)
MCH: 28.5 pg (ref 26.0–34.0)
MCHC: 33.1 g/dL (ref 30.0–36.0)
MCV: 85.9 fL (ref 78.0–100.0)
Platelets: 224 10*3/uL (ref 150–400)
RBC: 3.55 MIL/uL — ABNORMAL LOW (ref 3.87–5.11)
RDW: 13.7 % (ref 11.5–15.5)
WBC: 6.2 10*3/uL (ref 4.0–10.5)

## 2011-02-10 LAB — BASIC METABOLIC PANEL
BUN: 14 mg/dL (ref 6–23)
CO2: 26 mEq/L (ref 19–32)
Calcium: 9.4 mg/dL (ref 8.4–10.5)
Chloride: 102 mEq/L (ref 96–112)
Creatinine, Ser: 0.64 mg/dL (ref 0.50–1.10)
GFR calc Af Amer: >90 mL/min (ref >90)
GFR calc non Af Amer: 82 mL/min — ABNORMAL LOW (ref >90)
Glucose, Bld: 93 mg/dL (ref 70–99)
Potassium: 3.3 mEq/L — ABNORMAL LOW (ref 3.5–5.1)
Sodium: 141 mEq/L (ref 135–145)

## 2011-02-10 MED ORDER — AMOXICILLIN-POT CLAVULANATE 500-125 MG PO TABS
1.0000 | ORAL_TABLET | Freq: Two times a day (BID) | ORAL | Status: DC
  Filled 2011-02-10 (×2): qty 1

## 2011-02-10 MED ORDER — AMOXICILLIN-POT CLAVULANATE 500-125 MG PO TABS: 1.0000 | ORAL_TABLET | Freq: Two times a day (BID) | ORAL | Status: AC

## 2011-02-10 MED ORDER — PROPRANOLOL HCL ER 60 MG PO CP24: 60.0000 mg | ORAL_CAPSULE | Freq: Every day | ORAL | Status: DC

## 2011-02-10 NOTE — Progress Notes (Signed)
  Subjective: Feels much better, some diarrhea  Objective: Vital signs in last 24 hours: Temp:  [97.2 F (36.2 C)-98.2 F (36.8 C)] 97.4 F (36.3 C) (01/09 0800) Pulse Rate:  [67-82] 75  (01/09 0800) Resp:  [16-18] 18  (01/09 0800) BP: (96-133)/(56-79) 116/76 mmHg (01/09 0800) SpO2:  [94 %-98 %] 96 % (01/09 0800) Last BM Date: 02/09/11  Intake/Output from previous day: 01/08 0701 - 01/09 0700 In: 920 [P.O.:720; IV Piggyback:200] Out: 200 [Urine:200] Intake/Output this shift:    Chest wall: no tenderness, right sided mastectomy wound with much improved erythema, minimal drainage through lateral portion of incision  Lab Results:   Basename 02/10/11 0550 02/08/11 2030  WBC 6.2 14.9*  HGB 10.1* 10.0*  HCT 30.5* 29.4*  PLT 224 241   BMET  Basename 02/10/11 0550 02/08/11 2030  NA 141 135  K 3.3* 3.1*  CL 102 98  CO2 26 25  GLUCOSE 93 117*  BUN 14 22  CREATININE 0.64 1.02  CALCIUM 9.4 9.8   PT/INR  Basename 02/10/11 0550 02/08/11 2030  LABPROT 21.1* 23.9*  INR 1.79* 2.10*   ABG No results found for this basename: PHART:2,PCO2:2,PO2:2,HCO3:2 in the last 72 hours  Studies/Results: No results found.  Anti-infectives: Anti-infectives     Start     Dose/Rate Route Frequency Ordered Stop   02/08/11 2100   Ampicillin-Sulbactam (UNASYN) 3 g in sodium chloride 0.9 % 100 mL IVPB        3 g 100 mL/hr over 60 Minutes Intravenous Every 6 hours 02/08/11 2002            Assessment/Plan: SSI  Dc home on oral abx    LOS: 2 days    [facility] 02/10/2011

## 2011-02-10 NOTE — Progress Notes (Signed)
DC orders received.  Patient stable with no S/S distress.  Discharge instructions and medications reviewed with patient and family.  Patient DC home with family. Nolon Nations

## 2011-02-10 NOTE — Discharge Summary (Signed)
Physician Discharge Summary  Patient ID: Debra Lynn MRN: [id number] DOB/AGE: 03/20/1928 78 y.o.  Admit date: 02/08/2011 Discharge date: 02/10/2011  Admission Diagnoses: CHF Hx cva Recent right simple mastectomy for breast cancer GERD  Discharge Diagnoses:  SSI right mastectomy site  Discharged Condition: good  Hospital Course: 42 yof s/p right simple mastectomy for breast cancer.  Admitted for infection of mastectomy space.  WBC elevated.  This was drained and she was placed on iv antibiotics.  She is much improved.  Afebrile with normal wbc and feels better.  Will discharge home with oral abx.  Consults: none    Treatments: antibiotics: Unasyn  Discharge Exam: Blood pressure 116/76, pulse 75, temperature 97.4 F (36.3 C), temperature source Oral, resp. rate 18, SpO2 96.00%. Incision/Wound:much improved erythema with minimal drainage   Disposition: Home or Self Care  Discharge Orders    Future Appointments: Provider: Department: Dept Phone: Center:   02/22/2011 2:30 PM Lowella Dell, MD [facility] [phone] None   03/04/2011 11:10 AM Emelia Loron, MD [facility] [phone] None   05/07/2011 1:30 PM Waymon Budge, MD [facility] [phone] None     Medication List  As of 02/10/2011  9:44 AM   CONTINUE taking these medications         acetaminophen 500 MG tablet   Commonly known as: TYLENOL      amLODipine 2.5 MG tablet   Commonly known as: NORVASC      calcium-vitamin D 500-200 MG-UNIT per tablet      CENTRUM SILVER tablet      cetirizine 10 MG tablet   Commonly known as: ZYRTEC      losartan-hydrochlorothiazide 100-25 MG per tablet   Commonly known as: HYZAAR      MICRONIZED COLESTIPOL HCL 1 G tablet   Generic drug: colestipol      pantoprazole 40 MG tablet   Commonly known as: PROTONIX      potassium chloride SA 20 MEQ tablet   Commonly known as: K-DUR,KLOR-CON      pravastatin 40 MG tablet   Commonly known as: PRAVACHOL      RESTASIS 0.05 % ophthalmic emulsion   Generic drug: cycloSPORINE      warfarin 2.5 MG tablet   Commonly known as: COUMADIN         STOP taking these medications         HYDROcodone-acetaminophen 5-500 MG per tablet      propranolol 60 MG 24 hr capsule           Follow-up Information    Follow up with [name], MD in 1 week. [name] will call with appt for next week)    Contact information:   [facility] [address] [place] [address] [phone]          Signed: Emelia Loron 02/10/2011, 9:44 AM

## 2011-02-15 ENCOUNTER — Encounter (INDEPENDENT_AMBULATORY_CARE_PROVIDER_SITE_OTHER): Payer: Self-pay | Admitting: General Surgery

## 2011-02-15 ENCOUNTER — Ambulatory Visit (INDEPENDENT_AMBULATORY_CARE_PROVIDER_SITE_OTHER): Payer: Medicare Other | Admitting: General Surgery

## 2011-02-15 ENCOUNTER — Ambulatory Visit (INDEPENDENT_AMBULATORY_CARE_PROVIDER_SITE_OTHER): Payer: Medicare Other

## 2011-02-15 VITALS — BP 98/62 | HR 76 | Resp 16 | Ht 62.0 in | Wt 149.0 lb

## 2011-02-15 DIAGNOSIS — Z09 Encounter for follow-up examination after completed treatment for conditions other than malignant neoplasm: Secondary | ICD-10-CM

## 2011-02-15 DIAGNOSIS — J309 Allergic rhinitis, unspecified: Secondary | ICD-10-CM

## 2011-02-15 NOTE — Progress Notes (Signed)
Subjective:     Patient ID: Debra Lynn, female   DOB: [birth year], 78 y.o.   MRN: [id number]  HPI This is an 78 year old female who underwent a right simple mastectomy by me. This was complicated by an infected hematoma. I actually had her admitted to the hospital for a couple of days and drained out a fair amount of. The fluid at the bedside also. Her white count was normalized. Her erythema is much improved on IV antibiotics I discharged her to home on oral antibiotics. She returns today doing well without any significant complaints.  Review of Systems     Objective:   Physical Exam Healing right mastectomy incision without any more erythema, no real drainage today    Assessment:     S/p right mastectomy with infected hematoma    Plan:     Continue dressing changes if draining Return in one week Complete antibioitics

## 2011-02-22 ENCOUNTER — Ambulatory Visit (INDEPENDENT_AMBULATORY_CARE_PROVIDER_SITE_OTHER): Payer: Medicare Other

## 2011-02-22 ENCOUNTER — Ambulatory Visit (HOSPITAL_BASED_OUTPATIENT_CLINIC_OR_DEPARTMENT_OTHER): Payer: Medicare Other | Admitting: Oncology

## 2011-02-22 VITALS — BP 105/69 | HR 76 | Temp 98.0°F | Ht 62.0 in | Wt 151.2 lb

## 2011-02-22 DIAGNOSIS — J309 Allergic rhinitis, unspecified: Secondary | ICD-10-CM

## 2011-02-22 DIAGNOSIS — C50219 Malignant neoplasm of upper-inner quadrant of unspecified female breast: Secondary | ICD-10-CM

## 2011-02-22 DIAGNOSIS — Z170 Estrogen receptor positive status [ER+]: Secondary | ICD-10-CM

## 2011-02-22 DIAGNOSIS — Z9010 Acquired absence of unspecified breast and nipple: Secondary | ICD-10-CM

## 2011-02-22 NOTE — Progress Notes (Signed)
CC: Debra Lynn  CSN [id number]  HPI: The patient had routine screening mammography 12/15/2010 at [facility]. She has heterogeneously dense breast tissue. There was a suggestion of a new mass in the upper inner quadrant of the right breast and she was brought back November 16 for right diagnostic mammography and ultrasonography. Spot compression views confirmed a 1.8 cm multilobulated mass which by ultrasound measured 2.1 cm, was ill-defined and hypoechoic. Biopsies November 19 ([id number]) showed an invasive ductal carcinoma, grade 2, strongly e-cadherin positive, estrogen receptor 90% positive, progesterone receptor negative, with no HER to amplification by CISH. The MIB-1 was 25%.  The patient is not a candidate for MRI because she has a pacemaker in place. Accordingly she underwent BSGI, showing a solitary focus of increased uptake, measuring 2.3 cm. With this information she was seen at the [facility] and a recommendation tomproceed with surgery was made. She had a Right simple mastectomy  01/15/2011 with results as noted below.  Interval history: The patient returns today with her husband and daughter to review results of her surgery. She tolerated the mastectomy well. She did develop an infection which took a few days to clear, but there was no bleeding or dehiscence. She still occasionally has some pain in that area, which he controls with Tylenol. She is walking inside 10 minutes in the morning and 20 minutes in the evening.  ROS: Otherwise a review of systems is stable. She has had no further problems with her congestive heart failure, worsening shortness of breath, or other cardiac issues. A detailed review of systems showed no new symptoms.   Past Medical History  Diagnosis Date  . Sick sinus syndrome     with pacemaker and ablation  . Stroke 2010  . Glaucoma   . Seasonal allergic rhinitis     Pos Skin Test 07-05-07  . GERD (gastroesophageal reflux disease)   . Atrial fibrillation   .  Hypertension   . Hyperlipidemia   . Cancer bREAST CA(12/2010)  . Chronic kidney disease TUMOR ON RIGHT KIDNEY-OBSERVING  . Arthritis KNEES &BACK  . Neuromuscular disorder SLIGHT TREMOR.  . Bleeding from breast     right   Of note, the patient was not on warfarin or aspirin when she had her CVA.  Past Surgical History  Procedure Date  . Cholecystectomy   . Vesicovaginal fistula closure w/ tah   . Atrial ablation surgery   . Flexible bronchoscopy w/ upper endoscopy     and swallowing evaluation  . Pacemaker insertion   . Knee arthroscopy   . Cataract extraction   . Insert / replace / remove pacemaker 01/2007    [facility] BY [facility]  . Abdominal hysterectomy 1975    pt. unsure if laparascopic/vaginal/abdominal  . Breast surgery 01/2011    mastectomy    Family History  Problem Relation Age of Onset  . Asthma Brother   . Asthma Daughter   . Heart disease    . Cancer    . Heart failure Mother   . Cancer Father     brain  . Lymphoma Brother   . Diabetes Brother   . Hypertension Brother     The patient's father died at the age of 36 from central nervous system cancer. The patient's mother died at the age of 43. The patient had one brother who had a history of lymphoma, but survives.  Gynecologic history: GX P2. 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 Her husband Melvyn Neth presents today I used to work for [employer]. Daughter [name], present today, lives in [place] and is a retired Scientist, research (life sciences). [name] is a widow of my former patient "[name]" Cathleen Fears.) Daughter Tamea Bai lives in [place] where she works as a Human resources officer. The patient has 2 grandsons. She is not a Advice worker. The patient does have advanced directives in place.  Health maintenance:  The patient  reports that she has  never smoked. She has never used smokeless tobacco. She reports that she does not drink alcohol or use illicit drugs.   Cholesterol --under treatment  Bone density at [facility] 02/16/2011 as noted below  Colonoscopy -does not recall date   (PAP) --  Allergies: No Known Allergies     . propranolol  60 mg Oral Daily   Current Outpatient Prescriptions  Medication Sig Dispense Refill  . acetaminophen (TYLENOL) 500 MG tablet Take 500 mg by mouth every 6 (six) hours as needed. For pain.      Marland Kitchen amLODipine (NORVASC) 2.5 MG tablet Take 1 tablet by mouth daily.      . Calcium Carbonate-Vitamin D (CALCIUM-VITAMIN D) 500-200 MG-UNIT per tablet Take 1 tablet by mouth 2 (two) times daily with a meal.       . cetirizine (ZYRTEC) 10 MG tablet Take 10 mg by mouth daily as needed. For allergies      . losartan-hydrochlorothiazide (HYZAAR) 100-25 MG per tablet Take 1 tablet by mouth daily.      Marland Kitchen MICRONIZED COLESTIPOL HCL 1 G tablet Take 1 tablet by mouth daily.      . Multiple Vitamins-Minerals (CENTRUM SILVER) tablet Take 1 tablet by mouth daily.        . pantoprazole (PROTONIX) 40 MG tablet Take 1 tablet by mouth daily.      . potassium chloride SA (K-DUR,KLOR-CON) 20 MEQ tablet Take 1 tablet by mouth daily.      . pravastatin (PRAVACHOL) 40 MG tablet Take 1 tablet by mouth daily.      . propranolol (INDERAL LA) 60 MG 24 hr capsule Take 1 capsule (60 mg total) by mouth daily.  30 capsule  0  . RESTASIS 0.05 % ophthalmic emulsion Place 1 drop into both eyes every 12 (twelve) hours.       Marland Kitchen warfarin (COUMADIN) 2.5 MG tablet Take 2.5 mg by mouth daily. Takes 2.5 mg on Mon Wed and Friday and a half tablet all other days       Current Facility-Administered Medications  Medication Dose Route Frequency Provider Last Rate Last Dose  . HYDROcodone-acetaminophen (NORCO) 5-325 MG per tablet 1-2 tablet  1-2 tablet Oral Q4H PRN Emelia Loron, MD      . propranolol (INDERAL LA) 24 hr capsule 60 mg  60 mg Oral  Daily Emelia Loron, MD        Physical Exam:  Blood pressure 105/69, pulse 76, temperature 98 F (36.7 C), height 5\' 2"  (1.575 m), weight 151 lb 3.2 oz (68.584 kg).  Sclerae unicteric Oropharynx clear No peripheral adenopathy and particularly the right axilla is unremarkable Lungs no rales or rhonchi Heart regular rate and rhythm Abd benign MSK no focal spinal tenderness, no peripheral edema Neuro: nonfocal Breasts: Right breast status post mastectomy. The incision has healed very nicely. There is no unusual tenderness, erythema, swelling, or dehiscence.. The left breast shows no  suspicious findings.  LABS   No results found for this or any previous visit (from the past 48 hour(s)).  FILMS:  As already discussed.  Assessment: 78 year old [place] woman status post simple mastectomy 01/15/2011 for a T2 NX (stage II) invasive ductal carcinoma, grade 3, 100% estrogen receptor positive, with a borderline MIB-1, but progesterone receptor and HER-2 negative.  Plan:  We discussed her situation at length. She is not candidate for tamoxifen because of her cardiac issues. We obtained a bone density at [facility] 02/16/2011 and this showed a T. value of -1.7 at the right femoral neck. The FRAX score at the hip is 3.8% and the risk of a major osteoporotic fracture is 14.5%  For this reason we were where we of starting an aromatase inhibitor. Certainly we could also add a bisphosphonate, but that comes with its onset of side effects, and the patient is already on "so many medications I don't know what I'm taking". I think her benefits in terms of survival from adding an aromatase inhibitor would be minimal, given her serious comorbidities.    Accordingly we opted for observation only. The patient and her family are very much in agreement with this plan. She will see Dr. Dwain Sarna later this week and Dr. Fannie Knee in April. She will return to see [place] in August, and we will continue to "tagg team"  her every 3 months for the first 2 years, before extending the followup interval to 6 months. She knows to call for any problems that may develop before the next visit   Robina Hamor C 02/22/2011, 3:09 PM

## 2011-02-23 ENCOUNTER — Encounter (INDEPENDENT_AMBULATORY_CARE_PROVIDER_SITE_OTHER): Payer: Self-pay | Admitting: General Surgery

## 2011-02-23 ENCOUNTER — Ambulatory Visit (INDEPENDENT_AMBULATORY_CARE_PROVIDER_SITE_OTHER): Payer: Medicare Other | Admitting: General Surgery

## 2011-02-23 VITALS — BP 114/72 | HR 70 | Temp 97.5°F | Ht 62.0 in | Wt 150.0 lb

## 2011-02-23 DIAGNOSIS — Z09 Encounter for follow-up examination after completed treatment for conditions other than malignant neoplasm: Secondary | ICD-10-CM

## 2011-02-23 NOTE — Progress Notes (Signed)
Subjective:     Patient ID: Debra Lynn, female   DOB: 1929-01-04, 78 y.o.   MRN: [id number]  HPI This is an 78 year old female who I did a right simple mastectomy for breast cancer. Postoperatively she had an infection that I drained and had her admitted in the hospital for IV antibiotics. I was concerned about her pacemaker which is very close to all of this as well. This is all now resolved and her wound is healed. She returns today for a check and has really no complaints. She has been seen by medical oncology and the plan is just to follow her at this point.  Review of Systems     Objective:   Physical Exam Right mastectomy incision without infection and now healed completely    Assessment:     S/p right mastectomy    Plan:     This is now healed and she does not need to cover anymore May return to full activity I gave her PT referral again and rx to go for postmastectomy supplies Will return in 3 months for wound check and then can alternate visits with Dr. Darnelle Catalan for exams

## 2011-03-01 ENCOUNTER — Ambulatory Visit (INDEPENDENT_AMBULATORY_CARE_PROVIDER_SITE_OTHER): Payer: Medicare Other

## 2011-03-01 DIAGNOSIS — J309 Allergic rhinitis, unspecified: Secondary | ICD-10-CM

## 2011-03-04 ENCOUNTER — Encounter (INDEPENDENT_AMBULATORY_CARE_PROVIDER_SITE_OTHER): Payer: Medicare Other | Admitting: General Surgery

## 2011-03-09 ENCOUNTER — Ambulatory Visit (INDEPENDENT_AMBULATORY_CARE_PROVIDER_SITE_OTHER): Payer: Medicare Other

## 2011-03-09 DIAGNOSIS — J309 Allergic rhinitis, unspecified: Secondary | ICD-10-CM

## 2011-03-15 ENCOUNTER — Ambulatory Visit (INDEPENDENT_AMBULATORY_CARE_PROVIDER_SITE_OTHER): Payer: Medicare Other

## 2011-03-15 DIAGNOSIS — J309 Allergic rhinitis, unspecified: Secondary | ICD-10-CM

## 2011-03-22 ENCOUNTER — Ambulatory Visit (INDEPENDENT_AMBULATORY_CARE_PROVIDER_SITE_OTHER): Payer: Medicare Other

## 2011-03-22 DIAGNOSIS — J309 Allergic rhinitis, unspecified: Secondary | ICD-10-CM

## 2011-03-30 ENCOUNTER — Ambulatory Visit (INDEPENDENT_AMBULATORY_CARE_PROVIDER_SITE_OTHER): Payer: Medicare Other

## 2011-03-30 DIAGNOSIS — J309 Allergic rhinitis, unspecified: Secondary | ICD-10-CM

## 2011-04-05 ENCOUNTER — Ambulatory Visit (INDEPENDENT_AMBULATORY_CARE_PROVIDER_SITE_OTHER): Payer: Medicare Other

## 2011-04-05 DIAGNOSIS — J309 Allergic rhinitis, unspecified: Secondary | ICD-10-CM

## 2011-04-08 ENCOUNTER — Encounter: Payer: Self-pay | Admitting: Internal Medicine

## 2011-04-12 ENCOUNTER — Ambulatory Visit (INDEPENDENT_AMBULATORY_CARE_PROVIDER_SITE_OTHER): Payer: Medicare Other

## 2011-04-12 DIAGNOSIS — J309 Allergic rhinitis, unspecified: Secondary | ICD-10-CM

## 2011-04-21 ENCOUNTER — Ambulatory Visit (INDEPENDENT_AMBULATORY_CARE_PROVIDER_SITE_OTHER): Payer: Medicare Other

## 2011-04-21 DIAGNOSIS — J309 Allergic rhinitis, unspecified: Secondary | ICD-10-CM

## 2011-04-27 ENCOUNTER — Ambulatory Visit (INDEPENDENT_AMBULATORY_CARE_PROVIDER_SITE_OTHER): Payer: Medicare Other

## 2011-04-27 DIAGNOSIS — J309 Allergic rhinitis, unspecified: Secondary | ICD-10-CM

## 2011-05-04 ENCOUNTER — Ambulatory Visit (INDEPENDENT_AMBULATORY_CARE_PROVIDER_SITE_OTHER): Payer: Medicare Other

## 2011-05-04 DIAGNOSIS — J309 Allergic rhinitis, unspecified: Secondary | ICD-10-CM

## 2011-05-07 ENCOUNTER — Ambulatory Visit: Payer: Medicare Other | Admitting: Internal Medicine

## 2011-05-11 ENCOUNTER — Ambulatory Visit (INDEPENDENT_AMBULATORY_CARE_PROVIDER_SITE_OTHER): Payer: Medicare Other

## 2011-05-11 DIAGNOSIS — J309 Allergic rhinitis, unspecified: Secondary | ICD-10-CM

## 2011-05-19 ENCOUNTER — Ambulatory Visit (INDEPENDENT_AMBULATORY_CARE_PROVIDER_SITE_OTHER): Payer: Medicare Other

## 2011-05-19 DIAGNOSIS — J309 Allergic rhinitis, unspecified: Secondary | ICD-10-CM

## 2011-05-24 ENCOUNTER — Encounter (INDEPENDENT_AMBULATORY_CARE_PROVIDER_SITE_OTHER): Payer: Self-pay | Admitting: General Surgery

## 2011-05-24 ENCOUNTER — Ambulatory Visit (INDEPENDENT_AMBULATORY_CARE_PROVIDER_SITE_OTHER): Payer: Medicare Other | Admitting: General Surgery

## 2011-05-24 VITALS — BP 120/82 | HR 71 | Temp 97.7°F | Resp 18 | Ht 62.0 in | Wt 149.6 lb

## 2011-05-24 DIAGNOSIS — Z853 Personal history of malignant neoplasm of breast: Secondary | ICD-10-CM

## 2011-05-24 NOTE — Progress Notes (Signed)
Subjective:     Patient ID: Debra Lynn, female   DOB: Sep 29, 1928, 78 y.o.   MRN: [id number]  HPI  63 yof s/p right mastectomy complicated by wound infection.  This is now healed and she reports no complaints at this point. She is moving her arm well. Review of Systems     Objective:   Physical Exam Well healed right mastectomy incision without infection, no masses    Assessment:     S/p right mastectomy    Plan:     This has all healed at this point. Will plan on following up annually in conjunction with medical oncology

## 2011-05-27 ENCOUNTER — Ambulatory Visit (INDEPENDENT_AMBULATORY_CARE_PROVIDER_SITE_OTHER): Payer: Medicare Other

## 2011-05-27 ENCOUNTER — Ambulatory Visit (INDEPENDENT_AMBULATORY_CARE_PROVIDER_SITE_OTHER): Payer: Medicare Other | Admitting: Internal Medicine

## 2011-05-27 ENCOUNTER — Encounter: Payer: Self-pay | Admitting: Internal Medicine

## 2011-05-27 VITALS — BP 110/64 | HR 68 | Ht 63.0 in | Wt 155.8 lb

## 2011-05-27 DIAGNOSIS — R059 Cough, unspecified: Secondary | ICD-10-CM

## 2011-05-27 DIAGNOSIS — J301 Allergic rhinitis due to pollen: Secondary | ICD-10-CM

## 2011-05-27 DIAGNOSIS — R05 Cough: Secondary | ICD-10-CM

## 2011-05-27 DIAGNOSIS — J309 Allergic rhinitis, unspecified: Secondary | ICD-10-CM

## 2011-05-27 DIAGNOSIS — K219 Gastro-esophageal reflux disease without esophagitis: Secondary | ICD-10-CM

## 2011-05-27 MED ORDER — BENZONATATE 200 MG PO CAPS: 200.0000 mg | ORAL_CAPSULE | Freq: Three times a day (TID) | ORAL | Status: DC | PRN

## 2011-05-27 NOTE — Progress Notes (Signed)
11/06/10- 78 year old female never smoker followed for allergic rhinosinusitis, chronic cough, complicated by CVA, GERD, pacemaker, HBP.Marland Kitchen daughter here Last here -11/07/2009 She got flu vaccine. Continues allergy vaccine at 1-10 without problem. Cough has persisted over the last few months. It occasionally wakes her but she takes a little Tussionex each night to permit the per sleep. Occasional antihistamine. They recognize no relationship to whether or external factors. She may choke a little sometimes while swallowing but it is not much. Breathing is otherwise comfortable. She had a swallowing evaluation years ago. Does not recognize reflux or postnasal drip or wheeze. Since last here had pacemaker placed.  05/27/11- 78 year old female never smoker followed for allergic rhinosinusitis, chronic cough, complicated by CVA, GERD, pacemaker, HBP.Marland Kitchen daughter here Since last here had a right mastectomy without radiation or chemotherapy, on December 14. No respiratory complications with surgery. Has been wheezing in the past month, mostly mild and with exertion. Modified barium swallow 11/09/2010 showed poor esophageal emptying with a reflux cough. CXR 01/13/2011-right pacemaker. Clear lungs. She remains on chronic propranolol. Stopping it did not relieve her cough. She does use Tussionex.  ROS-Per HPI Constitutional:   No-   weight loss, night sweats, fevers, chills, fatigue, lassitude. HEENT:   No-  headaches, difficulty swallowing, tooth/dental problems, sore throat,       No-  sneezing, itching, ear ache, nasal congestion, post nasal drip,  CV:  No-   chest pain, orthopnea, PND, swelling in lower extremities, anasarca, dizziness, palpitations Resp: No-   shortness of breath with exertion or at rest.              No-   productive cough,  + non-productive cough,  No-  coughing up of blood.              No-   change in color of mucus.  + wheezing.   Skin: No-   rash or lesions. GI:  No-   heartburn,  indigestion, abdominal pain, nausea, vomiting,  GU:  MS:  No-   joint pain or swelling.   Neuro- nothing unusual Psych:  No- change in mood or affect. No depression or anxiety.  No memory loss.   OBJ General- Alert, Oriented, Affect-appropriate, Distress- none acute Skin- rash-none, lesions- none, excoriation- none Lymphadenopathy- none Head- atraumatic            Eyes- Gross vision intact, PERRLA, conjunctivae clear secretions            Ears- Hearing, canals-normal            Nose- Clear, no-Septal dev, mucus, polyps, erosion, perforation             Throat- Mallampati III , mucosa clear , drainage- none, tonsils- atrophic. Hesitant speech without stridor Neck- flexible , trachea midline, no stridor , thyroid nl, carotid no bruit Chest - symmetrical excursion , unlabored           Heart/CV- RRR , no murmur , no gallop  , no rub, nl s1 s2                           - JVD- none , edema- none, stasis changes- none, varices- none           Lung- clear to P&A, wheeze- none, cough- none , dullness-none, rub- none           Chest wall- pacemaker on right Abd-  Br/ Gen/ Rectal- Not done, not indicated  Extrem- cyanosis- none, clubbing, none, atrophy- none, strength- nl Neuro- grossly intact to observation

## 2011-05-27 NOTE — Patient Instructions (Signed)
For cough- try 1) Omeprazole 20 mg twice daily before meals every day 2) Chlorpheniramine antihistamine  4 mg twice daily to dry postnasal drip 3) benzonatate pearls up to 3 times daily if needed to supplement cough suppression.

## 2011-05-30 NOTE — Assessment & Plan Note (Signed)
Able to resume vaccine which is what she wants.

## 2011-05-30 NOTE — Assessment & Plan Note (Signed)
Discussed encouragement of motility, sitting upright to eat.

## 2011-06-03 ENCOUNTER — Telehealth: Payer: Self-pay | Admitting: Internal Medicine

## 2011-06-03 ENCOUNTER — Ambulatory Visit (INDEPENDENT_AMBULATORY_CARE_PROVIDER_SITE_OTHER): Payer: Medicare Other

## 2011-06-03 DIAGNOSIS — J309 Allergic rhinitis, unspecified: Secondary | ICD-10-CM

## 2011-06-03 MED ORDER — BENZONATATE 200 MG PO CAPS: 200.0000 mg | ORAL_CAPSULE | Freq: Three times a day (TID) | ORAL | Status: DC | PRN

## 2011-06-03 NOTE — Telephone Encounter (Signed)
rx has been sent and pt is aware. Nothing further was needed 

## 2011-06-03 NOTE — Telephone Encounter (Signed)
Ok to fill as requested for 90 day, ref x 3

## 2011-06-03 NOTE — Telephone Encounter (Signed)
Pt returned call. She uses [facility]. I advised her per mindy that rx will be called in for her. Debra Lynn

## 2011-06-03 NOTE — Telephone Encounter (Signed)
lmomtcb x1 for pt. Need to confirm the mail order she wants this sent to

## 2011-06-08 ENCOUNTER — Telehealth: Payer: Self-pay | Admitting: Internal Medicine

## 2011-06-08 MED ORDER — BENZONATATE 200 MG PO CAPS: 200.0000 mg | ORAL_CAPSULE | Freq: Three times a day (TID) | ORAL | Status: AC | PRN

## 2011-06-08 NOTE — Telephone Encounter (Signed)
Pt aware that 30 day supply sent to [facility] with refills as RX was too expensive through [facility].

## 2011-06-10 ENCOUNTER — Ambulatory Visit (INDEPENDENT_AMBULATORY_CARE_PROVIDER_SITE_OTHER): Payer: Medicare Other

## 2011-06-10 DIAGNOSIS — J309 Allergic rhinitis, unspecified: Secondary | ICD-10-CM

## 2011-06-17 ENCOUNTER — Ambulatory Visit (INDEPENDENT_AMBULATORY_CARE_PROVIDER_SITE_OTHER): Payer: Medicare Other

## 2011-06-17 DIAGNOSIS — J309 Allergic rhinitis, unspecified: Secondary | ICD-10-CM

## 2011-06-23 ENCOUNTER — Ambulatory Visit (INDEPENDENT_AMBULATORY_CARE_PROVIDER_SITE_OTHER): Payer: Medicare Other

## 2011-06-23 DIAGNOSIS — J309 Allergic rhinitis, unspecified: Secondary | ICD-10-CM

## 2011-07-01 ENCOUNTER — Ambulatory Visit (INDEPENDENT_AMBULATORY_CARE_PROVIDER_SITE_OTHER): Payer: Medicare Other

## 2011-07-01 DIAGNOSIS — J309 Allergic rhinitis, unspecified: Secondary | ICD-10-CM

## 2011-07-08 ENCOUNTER — Ambulatory Visit (INDEPENDENT_AMBULATORY_CARE_PROVIDER_SITE_OTHER): Payer: Medicare Other

## 2011-07-08 DIAGNOSIS — J309 Allergic rhinitis, unspecified: Secondary | ICD-10-CM

## 2011-07-13 ENCOUNTER — Encounter: Payer: Self-pay | Admitting: Internal Medicine

## 2011-07-15 ENCOUNTER — Ambulatory Visit (INDEPENDENT_AMBULATORY_CARE_PROVIDER_SITE_OTHER): Payer: Medicare Other

## 2011-07-15 DIAGNOSIS — J309 Allergic rhinitis, unspecified: Secondary | ICD-10-CM

## 2011-07-22 ENCOUNTER — Ambulatory Visit (INDEPENDENT_AMBULATORY_CARE_PROVIDER_SITE_OTHER): Payer: Medicare Other

## 2011-07-22 DIAGNOSIS — J309 Allergic rhinitis, unspecified: Secondary | ICD-10-CM

## 2011-07-29 ENCOUNTER — Ambulatory Visit (INDEPENDENT_AMBULATORY_CARE_PROVIDER_SITE_OTHER): Payer: Medicare Other

## 2011-07-29 DIAGNOSIS — J309 Allergic rhinitis, unspecified: Secondary | ICD-10-CM

## 2011-08-04 ENCOUNTER — Ambulatory Visit (INDEPENDENT_AMBULATORY_CARE_PROVIDER_SITE_OTHER): Payer: Medicare Other

## 2011-08-04 DIAGNOSIS — J309 Allergic rhinitis, unspecified: Secondary | ICD-10-CM

## 2011-08-12 ENCOUNTER — Ambulatory Visit (INDEPENDENT_AMBULATORY_CARE_PROVIDER_SITE_OTHER): Payer: Medicare Other

## 2011-08-12 DIAGNOSIS — J309 Allergic rhinitis, unspecified: Secondary | ICD-10-CM

## 2011-08-19 ENCOUNTER — Ambulatory Visit (INDEPENDENT_AMBULATORY_CARE_PROVIDER_SITE_OTHER): Payer: Medicare Other

## 2011-08-19 DIAGNOSIS — J309 Allergic rhinitis, unspecified: Secondary | ICD-10-CM

## 2011-08-26 ENCOUNTER — Ambulatory Visit (INDEPENDENT_AMBULATORY_CARE_PROVIDER_SITE_OTHER): Payer: Medicare Other

## 2011-08-26 DIAGNOSIS — J309 Allergic rhinitis, unspecified: Secondary | ICD-10-CM

## 2011-09-02 ENCOUNTER — Ambulatory Visit (INDEPENDENT_AMBULATORY_CARE_PROVIDER_SITE_OTHER): Payer: Medicare Other

## 2011-09-02 DIAGNOSIS — J309 Allergic rhinitis, unspecified: Secondary | ICD-10-CM

## 2011-09-06 ENCOUNTER — Emergency Department (HOSPITAL_COMMUNITY): Payer: Medicare Other

## 2011-09-06 ENCOUNTER — Emergency Department (HOSPITAL_COMMUNITY)
Admission: EM | Admit: 2011-09-06 | Discharge: 2011-09-06 | Disposition: A | Discharge status: Home / Self Care | Payer: Medicare Other | Attending: Emergency Medicine | Admitting: Emergency Medicine

## 2011-09-06 ENCOUNTER — Encounter (HOSPITAL_COMMUNITY): Payer: Self-pay

## 2011-09-06 DIAGNOSIS — Z9089 Acquired absence of other organs: Secondary | ICD-10-CM | POA: Insufficient documentation

## 2011-09-06 DIAGNOSIS — K219 Gastro-esophageal reflux disease without esophagitis: Secondary | ICD-10-CM | POA: Insufficient documentation

## 2011-09-06 DIAGNOSIS — Z79899 Other long term (current) drug therapy: Secondary | ICD-10-CM | POA: Insufficient documentation

## 2011-09-06 DIAGNOSIS — R569 Unspecified convulsions: Secondary | ICD-10-CM | POA: Insufficient documentation

## 2011-09-06 DIAGNOSIS — Z8739 Personal history of other diseases of the musculoskeletal system and connective tissue: Secondary | ICD-10-CM | POA: Insufficient documentation

## 2011-09-06 DIAGNOSIS — Z8673 Personal history of transient ischemic attack (TIA), and cerebral infarction without residual deficits: Secondary | ICD-10-CM | POA: Insufficient documentation

## 2011-09-06 DIAGNOSIS — N189 Chronic kidney disease, unspecified: Secondary | ICD-10-CM | POA: Insufficient documentation

## 2011-09-06 DIAGNOSIS — I129 Hypertensive chronic kidney disease with stage 1 through stage 4 chronic kidney disease, or unspecified chronic kidney disease: Secondary | ICD-10-CM | POA: Insufficient documentation

## 2011-09-06 DIAGNOSIS — Z853 Personal history of malignant neoplasm of breast: Secondary | ICD-10-CM | POA: Insufficient documentation

## 2011-09-06 LAB — CBC
HCT: 37.1 % (ref 36.0–46.0)
Hemoglobin: 12.5 g/dL (ref 12.0–15.0)
MCH: 28.6 pg (ref 26.0–34.0)
MCHC: 33.7 g/dL (ref 30.0–36.0)
MCV: 84.9 fL (ref 78.0–100.0)
Platelets: 164 10*3/uL (ref 150–400)
RBC: 4.37 MIL/uL (ref 3.87–5.11)
RDW: 13.5 % (ref 11.5–15.5)
WBC: 6.9 10*3/uL (ref 4.0–10.5)

## 2011-09-06 LAB — POCT I-STAT, CHEM 8
BUN: 18 mg/dL (ref 6–23)
Calcium, Ion: 1.23 mmol/L (ref 1.13–1.30)
Chloride: 102 mEq/L (ref 96–112)
Creatinine, Ser: 0.9 mg/dL (ref 0.50–1.10)
Glucose, Bld: 109 mg/dL — ABNORMAL HIGH (ref 70–99)
HCT: 39 % (ref 36.0–46.0)
Hemoglobin: 13.3 g/dL (ref 12.0–15.0)
Potassium: 3.8 mEq/L (ref 3.5–5.1)
Sodium: 137 mEq/L (ref 135–145)
TCO2: 24 mmol/L (ref 0–100)

## 2011-09-06 LAB — DIFFERENTIAL
Basophils Absolute: 0 10*3/uL (ref 0.0–0.1)
Basophils Relative: 0 % (ref 0–1)
Eosinophils Absolute: 0.3 10*3/uL (ref 0.0–0.7)
Eosinophils Relative: 4 % (ref 0–5)
Lymphocytes Relative: 32 % (ref 12–46)
Lymphs Abs: 2.2 10*3/uL (ref 0.7–4.0)
Monocytes Absolute: 0.7 10*3/uL (ref 0.1–1.0)
Monocytes Relative: 10 % (ref 3–12)
Neutro Abs: 3.7 10*3/uL (ref 1.7–7.7)
Neutrophils Relative %: 53 % (ref 43–77)

## 2011-09-06 LAB — COMPREHENSIVE METABOLIC PANEL
ALT: 14 U/L (ref 0–35)
AST: 18 U/L (ref 0–37)
Albumin: 4.1 g/dL (ref 3.5–5.2)
Alkaline Phosphatase: 41 U/L (ref 39–117)
BUN: 17 mg/dL (ref 6–23)
CO2: 27 mEq/L (ref 19–32)
Calcium: 9.7 mg/dL (ref 8.4–10.5)
Chloride: 99 mEq/L (ref 96–112)
Creatinine, Ser: 0.79 mg/dL (ref 0.50–1.10)
GFR calc Af Amer: 89 mL/min — ABNORMAL LOW (ref >90)
GFR calc non Af Amer: 77 mL/min — ABNORMAL LOW (ref >90)
Glucose, Bld: 106 mg/dL — ABNORMAL HIGH (ref 70–99)
Potassium: 3.8 mEq/L (ref 3.5–5.1)
Sodium: 137 mEq/L (ref 135–145)
Total Bilirubin: 0.3 mg/dL (ref 0.3–1.2)
Total Protein: 7.6 g/dL (ref 6.0–8.3)

## 2011-09-06 LAB — CK TOTAL AND CKMB (NOT AT ARMC)
CK, MB: 2 ng/mL (ref 0.3–4.0)
Relative Index: INVALID (ref 0.0–2.5)
Total CK: 82 U/L (ref 7–177)

## 2011-09-06 LAB — TROPONIN I: Troponin I: <0.3 ng/mL (ref <0.30)

## 2011-09-06 LAB — PHOSPHORUS: Phosphorus: 3.1 mg/dL (ref 2.3–4.6)

## 2011-09-06 LAB — PROTIME-INR
INR: 2.07 — ABNORMAL HIGH (ref 0.00–1.49)
Prothrombin Time: 23.7 seconds — ABNORMAL HIGH (ref 11.6–15.2)

## 2011-09-06 LAB — APTT: aPTT: 36 seconds (ref 24–37)

## 2011-09-06 LAB — MAGNESIUM: Magnesium: 1.9 mg/dL (ref 1.5–2.5)

## 2011-09-06 MED ORDER — LEVETIRACETAM 500 MG PO TABS: 500.0000 mg | ORAL_TABLET | Freq: Two times a day (BID) | ORAL | Status: DC

## 2011-09-06 NOTE — Code Documentation (Signed)
Code Stroke called at 1330 Patient arrival 14 EDP exam 1326 Stroke team arrival 1338 LSN 1130 Pt arrived in [place] 1330 Phlebotomist arrival 1327  Cancel code stroke 1345  Pt with hx of AF on coumadin and PPM. Hx CVA, baseline tremor

## 2011-09-06 NOTE — ED Notes (Signed)
Pt complains of possible seizure of mini stroke, pt sts her right arm started shaking and she couldn't control it. Pt with broken speech, pt has a tremor hx, pt sts feels disoriented., onet of symptoms at 1130, last seen normal by husband at 0630 and was asleep.

## 2011-09-06 NOTE — ED Provider Notes (Signed)
History     CSN: [id number]  Arrival date & time 09/06/11  1313   First MD Initiated Contact with Patient 09/06/11 1345      Chief Complaint  Patient presents with  . Code Stroke  . Seizures    (Consider location/radiation/quality/duration/timing/severity/associated sxs/prior treatment) HPI Pt reports she was home alone about 2 hrs prior to arrival when she noticed her R arm and head shaking uncontrollably. She states she sat down, symptoms lasted for a few minutes and resolved spontaneously. She felt disoriented afterwards, but did not have loss of consciousness, tongue biting or incontinence. She called family who brought her to the ED. She is back to baseline now. Has remote history of stroke without any significant residual deficits. She also has history of essential tremor. Code Stroke was called from Triage, but cancelled after my evaluation.   Past Medical History  Diagnosis Date  . Sick sinus syndrome     with pacemaker and ablation  . Stroke 2010  . Glaucoma   . Seasonal allergic rhinitis     Pos Skin Test 07-05-07  . GERD (gastroesophageal reflux disease)   . Atrial fibrillation   . Hypertension   . Hyperlipidemia   . Cancer bREAST CA(12/2010)  . Chronic kidney disease TUMOR ON RIGHT KIDNEY-OBSERVING  . Arthritis KNEES &BACK  . Neuromuscular disorder SLIGHT TREMOR.  . Bleeding from breast     right    Past Surgical History  Procedure Date  . Cholecystectomy   . Vesicovaginal fistula closure w/ tah   . Atrial ablation surgery   . Flexible bronchoscopy w/ upper endoscopy     and swallowing evaluation  . Pacemaker insertion   . Knee arthroscopy   . Cataract extraction   . Insert / replace / remove pacemaker 01/2007    [facility]  . Abdominal hysterectomy 1975    pt. unsure if laparascopic/vaginal/abdominal  . Breast surgery 01/2011    mastectomy    Family History  Problem Relation Age of Onset  . Asthma Brother   . Asthma Daughter     . Heart disease    . Cancer    . Heart failure Mother   . Cancer Father     brain  . Lymphoma Brother   . Diabetes Brother   . Hypertension Brother     History  Substance Use Topics  . Smoking status: Never Smoker   . Smokeless tobacco: Never Used  . Alcohol Use: No    OB History    Grav Para Term Preterm Abortions TAB SAB Ect Mult Living                  Review of Systems All other systems reviewed and are negative except as noted in HPI.   Allergies  Review of patient's allergies indicates no known allergies.  Home Medications   Current Outpatient Rx  Name Route Sig Dispense Refill  . ACETAMINOPHEN 500 MG PO TABS Oral Take 1,000 mg by mouth every 6 (six) hours as needed. For pain.    Marland Kitchen AMLODIPINE BESYLATE 2.5 MG PO TABS Oral Take 1 tablet by mouth daily.    Marland Kitchen BENZONATATE 100 MG PO CAPS Oral Take 100 mg by mouth 3 (three) times daily as needed. For cough    . CALCIUM-VITAMIN D 500-200 MG-UNIT PO TABS Oral Take 1 tablet by mouth 2 (two) times daily with a meal.     . CEPHALEXIN 250 MG PO CAPS Oral Take 1 capsule  by mouth at bedtime.     Marland Kitchen CETIRIZINE HCL 10 MG PO TABS Oral Take 10 mg by mouth at bedtime. For allergies    . COLESTIPOL HCL 1 G PO TABS Oral Take 1 g by mouth at bedtime.    Marland Kitchen LOSARTAN POTASSIUM-HCTZ 100-25 MG PO TABS Oral Take 1 tablet by mouth daily.    . CENTRUM SILVER PO TABS Oral Take 1 tablet by mouth daily.      Marland Kitchen PANTOPRAZOLE SODIUM 40 MG PO TBEC Oral Take 1 tablet by mouth daily.    Marland Kitchen POTASSIUM CHLORIDE CRYS ER 20 MEQ PO TBCR Oral Take 1 tablet by mouth at bedtime.     Marland Kitchen PRAVASTATIN SODIUM 40 MG PO TABS Oral Take 1 tablet by mouth at bedtime.     Marland Kitchen ALIGN 4 MG PO CAPS Oral Take 4 mg by mouth daily.    Marland Kitchen PROPRANOLOL HCL 60 MG PO TABS Oral Take 60 mg by mouth 2 (two) times daily.    . RESTASIS 0.05 % OP EMUL Both Eyes Place 1 drop into both eyes every 12 (twelve) hours.     . WARFARIN SODIUM 2.5 MG PO TABS Oral Take 1.25-2.5 mg by mouth every  evening. Takes 2.5 mg on Mon Wed and Friday and a half tablet all other days      There were no vitals taken for this visit.  Physical Exam  Nursing note and vitals reviewed. Constitutional: She is oriented to person, place, and time. She appears well-developed and well-nourished.  HENT:  Head: Normocephalic and atraumatic.  Eyes: EOM are normal. Pupils are equal, round, and reactive to light.  Neck: Normal range of motion. Neck supple.  Cardiovascular: Normal rate, normal heart sounds and intact distal pulses.   Pulmonary/Chest: Effort normal and breath sounds normal.  Abdominal: Bowel sounds are normal. She exhibits no distension. There is no tenderness.  Musculoskeletal: Normal range of motion. She exhibits no edema and no tenderness.  Neurological: She is alert and oriented to person, place, and time. She has normal strength. No cranial nerve deficit or sensory deficit.  Skin: Skin is warm and dry. No rash noted.  Psychiatric: She has a normal mood and affect.    ED Course  Procedures (including critical care time)  Labs Reviewed  PROTIME-INR - Abnormal; Notable for the following:    Prothrombin Time 23.7 (*)     INR 2.07 (*)     All other components within normal limits  COMPREHENSIVE METABOLIC PANEL - Abnormal; Notable for the following:    Glucose, Bld 106 (*)     GFR calc non Af Amer 77 (*)     GFR calc Af Amer 89 (*)     All other components within normal limits  POCT I-STAT, CHEM 8 - Abnormal; Notable for the following:    Glucose, Bld 109 (*)     All other components within normal limits  APTT  CBC  DIFFERENTIAL  CK TOTAL AND CKMB  TROPONIN I  MAGNESIUM  PHOSPHORUS   Ct Head Wo Contrast  09/06/2011  *RADIOLOGY REPORT*  Clinical Data: Seizure versus transient ischemic attack  CT HEAD WITHOUT CONTRAST  Technique:  Contiguous axial images were obtained from the base of the skull through the vertex without contrast.  Comparison: Prior CT head 10/10/2009  Findings:  No acute intracranial hemorrhage, definitive acute infarction, mass lesion, mass effect, new hydrocephalus or midline shift.  Background of global cerebral and cerebellar volume loss with compensatory ex vacuo  dilatation of the lateral ventricles appear similar compared to the prior study.  Additionally, extensive periventricular, subcortical and deep white matter hypoattenuation is also unchanged and pattern, and severity. Although nonspecific, this is likely to reflect sequela of longstanding microvascular ischemic disease.  Additionally, there are focal hypoattenuating lesions in both basal ganglia including caudate heads consistent with the sequela of remote lacunar infarctions.  Atherosclerotic calcifications identified in the cavernous portion of the bilateral internal carotid arteries.  Mastoid air cells and paranasal sinuses are well-aerated.  No acute bony calvarial abnormality.  IMPRESSION:  1.  No acute intracranial abnormality. 2.  Similar pattern of extensive periventricular, subcortical and deep white matter hypoattenuation compared to 10/10/2009.  While these findings remain nonspecific, the sequela of longstanding microvascular ischemic disease is likely.  Alternately, a demyelinating disease can have a similar radiographic appearance. 3.  Stable remote lacunar infarctions the bilateral basal ganglia. 4.  Intracranial atherosclerotic vascular disease  These results were called by telephone on 09/06/2011 at 01:45 p.m. to Dr. Bernette Mayers, who verbally acknowledged these results.  Original Report Authenticated By: Vilma Prader     No diagnosis found.    MDM  Ct neg for acute process. Discussed with Dr. Thad Ranger who advised checking Mg and Phos. Agrees this was likely a focal seizure. No concern for stroke. Pt has pacemaker and cannot get MRI. Dr. Thad Ranger recommended starting Keppra 500mg  BID, given Neurology followup. Discussed this plan with family who are amenable. Advised to return for any worsening  or concerning symptoms.    Date: 09/06/2011  Rate: 60  Rhythm: paced  QRS Axis: indeterminate  Intervals: paced  ST/T Wave abnormalities: pacer  Conduction Disutrbances:paced  Narrative Interpretation:   Old EKG Reviewed: unchanged        Charles B. Bernette Mayers, MD 09/06/11 [phone]

## 2011-09-10 ENCOUNTER — Ambulatory Visit (INDEPENDENT_AMBULATORY_CARE_PROVIDER_SITE_OTHER): Payer: Medicare Other

## 2011-09-10 DIAGNOSIS — J309 Allergic rhinitis, unspecified: Secondary | ICD-10-CM

## 2011-09-16 ENCOUNTER — Ambulatory Visit (INDEPENDENT_AMBULATORY_CARE_PROVIDER_SITE_OTHER): Payer: Medicare Other

## 2011-09-16 DIAGNOSIS — J309 Allergic rhinitis, unspecified: Secondary | ICD-10-CM

## 2011-09-20 ENCOUNTER — Other Ambulatory Visit (HOSPITAL_BASED_OUTPATIENT_CLINIC_OR_DEPARTMENT_OTHER): Payer: Medicare Other | Admitting: Lab

## 2011-09-20 DIAGNOSIS — C50219 Malignant neoplasm of upper-inner quadrant of unspecified female breast: Secondary | ICD-10-CM

## 2011-09-20 LAB — CBC WITH DIFFERENTIAL/PLATELET
BASO%: 0.3 % (ref 0.0–2.0)
Basophils Absolute: 0 10*3/uL (ref 0.0–0.1)
EOS%: 3.6 % (ref 0.0–7.0)
Eosinophils Absolute: 0.2 10*3/uL (ref 0.0–0.5)
HCT: 38.4 % (ref 34.8–46.6)
HGB: 13.1 g/dL (ref 11.6–15.9)
LYMPH%: 29.9 % (ref 14.0–49.7)
MCH: 28.8 pg (ref 25.1–34.0)
MCHC: 34.1 g/dL (ref 31.5–36.0)
MCV: 84.4 fL (ref 79.5–101.0)
MONO#: 0.4 10*3/uL (ref 0.1–0.9)
MONO%: 5.8 % (ref 0.0–14.0)
NEUT#: 3.8 10*3/uL (ref 1.5–6.5)
NEUT%: 60.4 % (ref 38.4–76.8)
Platelets: 175 10*3/uL (ref 145–400)
RBC: 4.55 10*6/uL (ref 3.70–5.45)
RDW: 13.5 % (ref 11.2–14.5)
WBC: 6.4 10*3/uL (ref 3.9–10.3)
lymph#: 1.9 10*3/uL (ref 0.9–3.3)

## 2011-09-20 LAB — COMPREHENSIVE METABOLIC PANEL
ALT: 15 U/L (ref 0–35)
AST: 19 U/L (ref 0–37)
Albumin: 4.6 g/dL (ref 3.5–5.2)
Alkaline Phosphatase: 41 U/L (ref 39–117)
BUN: 16 mg/dL (ref 6–23)
CO2: 28 mEq/L (ref 19–32)
Calcium: 9.8 mg/dL (ref 8.4–10.5)
Chloride: 101 mEq/L (ref 96–112)
Creatinine, Ser: 0.97 mg/dL (ref 0.50–1.10)
Glucose, Bld: 106 mg/dL — ABNORMAL HIGH (ref 70–99)
Potassium: 3.9 mEq/L (ref 3.5–5.3)
Sodium: 139 mEq/L (ref 135–145)
Total Bilirubin: 0.5 mg/dL (ref 0.3–1.2)
Total Protein: 7.5 g/dL (ref 6.0–8.3)

## 2011-09-23 ENCOUNTER — Ambulatory Visit (INDEPENDENT_AMBULATORY_CARE_PROVIDER_SITE_OTHER): Payer: Medicare Other

## 2011-09-23 DIAGNOSIS — J309 Allergic rhinitis, unspecified: Secondary | ICD-10-CM

## 2011-09-27 ENCOUNTER — Telehealth: Payer: Self-pay | Admitting: Oncology

## 2011-09-27 ENCOUNTER — Ambulatory Visit (HOSPITAL_BASED_OUTPATIENT_CLINIC_OR_DEPARTMENT_OTHER): Payer: Medicare Other | Admitting: Oncology

## 2011-09-27 VITALS — BP 126/87 | HR 76 | Temp 97.1°F | Resp 20 | Ht 63.0 in | Wt 154.9 lb

## 2011-09-27 DIAGNOSIS — Z950 Presence of cardiac pacemaker: Secondary | ICD-10-CM

## 2011-09-27 DIAGNOSIS — N189 Chronic kidney disease, unspecified: Secondary | ICD-10-CM

## 2011-09-27 DIAGNOSIS — Z170 Estrogen receptor positive status [ER+]: Secondary | ICD-10-CM

## 2011-09-27 DIAGNOSIS — C50219 Malignant neoplasm of upper-inner quadrant of unspecified female breast: Secondary | ICD-10-CM

## 2011-09-27 NOTE — Telephone Encounter (Signed)
gve the pt her aug 2013 appt calendar 

## 2011-09-27 NOTE — Progress Notes (Signed)
Patient ID: Debra Lynn, female   DOB: 04-10-1928, 78 y.o.   MRN: [id number]

## 2011-09-27 NOTE — Telephone Encounter (Signed)
gve the pt her feb 2014 appt calendar °

## 2011-09-27 NOTE — Progress Notes (Signed)
ID: Debra Lynn   DOB: 1928/07/16  MR#: [id number]  WUJ#:[id number]  HISTORY OF PRESENT ILLNESS: The patient had routine screening mammography 12/15/2010 at [facility]. She has heterogeneously dense breast tissue. There was a suggestion of a new mass in the upper inner quadrant of the right breast and she was brought back November 16 for right diagnostic mammography and ultrasonography. Spot compression views confirmed a 1.8 cm multilobulated mass which by ultrasound measured 2.1 cm, was ill-defined and hypoechoic. Biopsies November 19 ([id number]) showed an invasive ductal carcinoma, grade 2, strongly e-cadherin positive, estrogen receptor 90% positive, progesterone receptor negative, with no HER to amplification by CISH. The MIB-1 was 25%.  The patient is not a candidate for MRI because she has a pacemaker in place. Accordingly she underwent BSGI, showing a solitary focus of increased uptake, measuring 2.3 cm. With this information she was seen at the [facility] and a recommendation tomproceed with surgery was made. She had a Right simple mastectomy 01/15/2011. Her subsequent history is as detailed below.   INTERVAL HISTORY: Ms. Debra Lynn returns today for followup of her breast cancer. Since her last visit here she had a witnessed seizure, which took her to the emergency room. Brain MRI, included below, showed no evidence of metastatic disease. She was started on Keppra, and feels that medication makes her a little dizzy.  REVIEW OF SYSTEMS: She denies headaches, visual changes, nausea, or vomiting. Her balance is not very good, she says, but there have been no falls. She spends a good deal of time now either in bed or in chair, perhaps more than 50% some days. She has a dry cough, occasionally productive of clear phlegm, which has been present for many years. This is if anything a little bit better. She has some pain in her right hip. Again this is not a new symptom. It is worse with walking. A detailed review of  systems was otherwise noncontributory.  PAST MEDICAL HISTORY: Past Medical History  Diagnosis Date  . Sick sinus syndrome     with pacemaker and ablation  . Stroke 2010  . Glaucoma   . Seasonal allergic rhinitis     Pos Skin Test 07-05-07  . GERD (gastroesophageal reflux disease)   . Atrial fibrillation   . Hypertension   . Hyperlipidemia   . Cancer bREAST CA(12/2010)  . Chronic kidney disease TUMOR ON RIGHT KIDNEY-OBSERVING  . Arthritis KNEES &BACK  . Neuromuscular disorder SLIGHT TREMOR.  . Bleeding from breast     right    PAST SURGICAL HISTORY: Past Surgical History  Procedure Date  . Cholecystectomy   . Vesicovaginal fistula closure w/ tah   . Atrial ablation surgery   . Flexible bronchoscopy w/ upper endoscopy     and swallowing evaluation  . Pacemaker insertion   . Knee arthroscopy   . Cataract extraction   . Insert / replace / remove pacemaker 01/2007    [facility]  . Abdominal hysterectomy 1975    pt. unsure if laparascopic/vaginal/abdominal  . Breast surgery 01/2011    mastectomy    FAMILY HISTORY Family History  Problem Relation Age of Onset  . Asthma Brother   . Asthma Daughter   . Heart disease    . Cancer    . Heart failure Mother   . Cancer Father     brain  . Lymphoma Brother   . Diabetes Brother   . Hypertension Brother   The patient's father died at the age of 32  from central nervous system cancer. The patient's mother died at the age of 1. The patient had one brother who had a history of lymphoma, but survives.  GYNECOLOGIC HISTORY: GX P2. 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 Her husband Debra Lynn used to work for [employer]. Daughter Debra Lynn lives in [place] and is a retired Scientist, research (life sciences). Debra Lynn is a widow of my former patient "Debra Lynn" Cathleen Lynn.)  Daughter Debra Lynn lives in [place] where she works as a Human resources officer. The patient has 2 grandsons. She is not a Advice worker.    ADVANCED DIRECTIVES: in place  HEALTH MAINTENANCE: History  Substance Use Topics  . Smoking status: Never Smoker   . Smokeless tobacco: Never Used  . Alcohol Use: No     Colonoscopy:  PAP:  Bone density: [facility] Jan 2013, T - 1.7 R fem neck  Lipid panel:  No Known Allergies  Current Outpatient Prescriptions  Medication Sig Dispense Refill  . acetaminophen (TYLENOL) 500 MG tablet Take 1,000 mg by mouth every 6 (six) hours as needed. For pain.      Marland Kitchen amLODipine (NORVASC) 2.5 MG tablet Take 1 tablet by mouth daily.      . benzonatate (TESSALON) 100 MG capsule Take 100 mg by mouth 3 (three) times daily as needed. For cough      . Calcium Carbonate-Vitamin D (CALCIUM-VITAMIN D) 500-200 MG-UNIT per tablet Take 1 tablet by mouth 2 (two) times daily with a meal.       . cephALEXin (KEFLEX) 250 MG capsule Take 1 capsule by mouth at bedtime.       . cetirizine (ZYRTEC) 10 MG tablet Take 10 mg by mouth at bedtime. For allergies      . colestipol (COLESTID) 1 G tablet Take 1 g by mouth at bedtime.      . levETIRAcetam (KEPPRA) 500 MG tablet Take 1 tablet (500 mg total) by mouth 2 (two) times daily.  60 tablet  0  . losartan-hydrochlorothiazide (HYZAAR) 100-25 MG per tablet Take 1 tablet by mouth daily.      . Multiple Vitamins-Minerals (CENTRUM SILVER) tablet Take 1 tablet by mouth daily.        . pantoprazole (PROTONIX) 40 MG tablet Take 1 tablet by mouth daily.      . potassium chloride SA (K-DUR,KLOR-CON) 20 MEQ tablet Take 1 tablet by mouth at bedtime.       . pravastatin (PRAVACHOL) 40 MG tablet Take 1 tablet by mouth at bedtime.       . Probiotic Product (ALIGN) 4 MG CAPS Take 4 mg by mouth daily.      . propranolol (INDERAL) 60 MG tablet Take 60 mg by mouth 2 (two) times daily.      . RESTASIS 0.05 % ophthalmic emulsion Place 1 drop into both  eyes every 12 (twelve) hours.       Marland Kitchen warfarin (COUMADIN) 2.5 MG tablet Take 1.25-2.5 mg by mouth every evening. Takes 2.5 mg on Mon Wed and Friday and a half tablet all other days       Current Facility-Administered Medications  Medication Dose Route Frequency Provider Last Rate Last Dose  . HYDROcodone-acetaminophen (NORCO) 5-325 MG per tablet 1-2 tablet  1-2 tablet Oral Q4H PRN Emelia Loron, MD      . propranolol (INDERAL LA) 24 hr capsule  60 mg  60 mg Oral Daily Emelia Loron, MD        OBJECTIVE: Elderly white female who appears frail Filed Vitals:   09/27/11 0937  BP: 126/87  Pulse: 76  Temp: 97.1 F (36.2 C)  Resp: 20     Body mass index is 27.44 kg/(m^2).    ECOG FS: 2-3  Sclerae unicteric Oropharynx clear No cervical or supraclavicular adenopathy Lungs no rales or rhonchi, fair excursion bilaterally Heart regular rate and rhythm (paced) Abd benign MSK straight leg raising to 30 on the left, 45 on the right. Figure so for bilaterally elicit discomfort but no pain Neuro: Extraocular movements are full, pupils are equal and reactive, tongue is midline, and motor is 5 over 54 major groups tested. There is no sensory level. Breasts: The right breast is status post mastectomy. There is no evidence of local recurrence. Her pacemaker is in the right chest wall is slightly above the incision. Left breast is unremarkable.  LAB RESULTS: Lab Results  Component Value Date   WBC 6.4 09/20/2011   NEUTROABS 3.8 09/20/2011   HGB 13.1 09/20/2011   HCT 38.4 09/20/2011   MCV 84.4 09/20/2011   PLT 175 09/20/2011      Chemistry      Component Value Date/Time   NA 139 09/20/2011 1033   K 3.9 09/20/2011 1033   CL 101 09/20/2011 1033   CO2 28 09/20/2011 1033   BUN 16 09/20/2011 1033   CREATININE 0.97 09/20/2011 1033      Component Value Date/Time   CALCIUM 9.8 09/20/2011 1033   ALKPHOS 41 09/20/2011 1033   AST 19 09/20/2011 1033   ALT 15 09/20/2011 1033   BILITOT 0.5 09/20/2011 1033         Lab Results  Component Value Date   LABCA2 15 12/30/2010    No components found with this basename: [id number]    No results found for this basename: INR:1;PROTIME:1 in the last 168 hours  Urinalysis    Component Value Date/Time   COLORURINE YELLOW 02/06/2008 1014   APPEARANCEUR CLOUDY* 02/06/2008 1014   LABSPEC 1.017 02/06/2008 1014   PHURINE 7.0 02/06/2008 1014   GLUCOSEU NEGATIVE 02/06/2008 1014   HGBUR LARGE* 02/06/2008 1014   BILIRUBINUR NEGATIVE 02/06/2008 1014   KETONESUR NEGATIVE 02/06/2008 1014   PROTEINUR 30* 02/06/2008 1014   UROBILINOGEN 0.2 02/06/2008 1014   NITRITE NEGATIVE 02/06/2008 1014   LEUKOCYTESUR LARGE* 02/06/2008 1014    STUDIES: Ct Head Wo Contrast  09/06/2011  *RADIOLOGY REPORT*  Clinical Data: Seizure versus transient ischemic attack  CT HEAD WITHOUT CONTRAST  Technique:  Contiguous axial images were obtained from the base of the skull through the vertex without contrast.  Comparison: Prior CT head 10/10/2009  Findings: No acute intracranial hemorrhage, definitive acute infarction, mass lesion, mass effect, new hydrocephalus or midline shift.  Background of global cerebral and cerebellar volume loss with compensatory ex vacuo dilatation of the lateral ventricles appear similar compared to the prior study.  Additionally, extensive periventricular, subcortical and deep white matter hypoattenuation is also unchanged and pattern, and severity. Although nonspecific, this is likely to reflect sequela of longstanding microvascular ischemic disease.  Additionally, there are focal hypoattenuating lesions in both basal ganglia including caudate heads consistent with the sequela of remote lacunar infarctions.  Atherosclerotic calcifications identified in the cavernous portion of the bilateral internal carotid arteries.  Mastoid air cells and paranasal sinuses are well-aerated.  No acute bony calvarial abnormality.  IMPRESSION:  1.  No  acute intracranial abnormality. 2.  Similar pattern of  extensive periventricular, subcortical and deep white matter hypoattenuation compared to 10/10/2009.  While these findings remain nonspecific, the sequela of longstanding microvascular ischemic disease is likely.  Alternately, a demyelinating disease can have a similar radiographic appearance. 3.  Stable remote lacunar infarctions the bilateral basal ganglia. 4.  Intracranial atherosclerotic vascular disease  These results were called by telephone on 09/06/2011 at 01:45 p.m. to Dr. Bernette Mayers, who verbally acknowledged these results.  Original Report Authenticated By: Vilma Prader    ASSESSMENT: 78 y.o. [place] woman status post simple mastectomy 01/15/2011 for a T2 NX (stage II) invasive ductal carcinoma, grade 3, 100% estrogen receptor positive, with a borderline MIB-1, progesterone receptor and HER-2 negative.  (1) not a candidate for tamoxifen due to cardiac issues and decided against aromatase inhibitors because of osteopenia present at baseline and concerns re. bisphosphonates   PLAN: Next mammogram will be in November at [facility]. She will return to see me in 6 months. Her multiple comorbid conditions are clearly much more important than her breast cancer as far as her prognosis is concerned. Her right hip exam was really no different from the left, if anything better, so we discussed possibly getting plain films of the right hip, but I think the chance of our finding there anything beyond simple arthritis or so low we decided to forgo that.  She wanted me to lower her Keppra dose, but I am going to let her neurologist in [place], Dr. Thad Ranger, work on her neurology issues. I am going to see a low-lying again in 6 months from now. She knows to call for any problems that may develop before the next visit.   Asuka Dusseau C    09/27/2011

## 2011-09-28 ENCOUNTER — Telehealth: Payer: Self-pay | Admitting: Oncology

## 2011-09-28 NOTE — Telephone Encounter (Signed)
Medical Records mailed out to pt on 09/28/11

## 2011-09-30 ENCOUNTER — Ambulatory Visit (INDEPENDENT_AMBULATORY_CARE_PROVIDER_SITE_OTHER): Payer: Medicare Other

## 2011-09-30 DIAGNOSIS — J309 Allergic rhinitis, unspecified: Secondary | ICD-10-CM

## 2011-10-07 ENCOUNTER — Ambulatory Visit (INDEPENDENT_AMBULATORY_CARE_PROVIDER_SITE_OTHER): Payer: Medicare Other

## 2011-10-07 DIAGNOSIS — J309 Allergic rhinitis, unspecified: Secondary | ICD-10-CM

## 2011-10-11 ENCOUNTER — Other Ambulatory Visit: Payer: Self-pay | Admitting: Gastroenterology

## 2011-10-11 DIAGNOSIS — R059 Cough, unspecified: Secondary | ICD-10-CM

## 2011-10-11 DIAGNOSIS — R05 Cough: Secondary | ICD-10-CM

## 2011-10-12 ENCOUNTER — Ambulatory Visit (INDEPENDENT_AMBULATORY_CARE_PROVIDER_SITE_OTHER): Payer: Medicare Other

## 2011-10-12 DIAGNOSIS — J309 Allergic rhinitis, unspecified: Secondary | ICD-10-CM

## 2011-10-14 ENCOUNTER — Ambulatory Visit (INDEPENDENT_AMBULATORY_CARE_PROVIDER_SITE_OTHER): Payer: Medicare Other

## 2011-10-14 DIAGNOSIS — J309 Allergic rhinitis, unspecified: Secondary | ICD-10-CM

## 2011-10-19 ENCOUNTER — Ambulatory Visit
Admission: RE | Admit: 2011-10-19 | Discharge: 2011-10-19 | Disposition: A | Discharge status: Home / Self Care | Payer: Medicare Other | Source: Ambulatory Visit | Attending: Gastroenterology | Admitting: Gastroenterology

## 2011-10-19 DIAGNOSIS — R05 Cough: Secondary | ICD-10-CM

## 2011-10-19 DIAGNOSIS — R059 Cough, unspecified: Secondary | ICD-10-CM

## 2011-10-20 ENCOUNTER — Ambulatory Visit (INDEPENDENT_AMBULATORY_CARE_PROVIDER_SITE_OTHER): Payer: Medicare Other

## 2011-10-20 DIAGNOSIS — J309 Allergic rhinitis, unspecified: Secondary | ICD-10-CM

## 2011-10-25 ENCOUNTER — Encounter: Payer: Self-pay | Admitting: Internal Medicine

## 2011-10-28 ENCOUNTER — Ambulatory Visit (INDEPENDENT_AMBULATORY_CARE_PROVIDER_SITE_OTHER): Payer: Medicare Other

## 2011-10-28 DIAGNOSIS — J309 Allergic rhinitis, unspecified: Secondary | ICD-10-CM

## 2011-11-04 ENCOUNTER — Ambulatory Visit (INDEPENDENT_AMBULATORY_CARE_PROVIDER_SITE_OTHER): Payer: Medicare Other

## 2011-11-04 DIAGNOSIS — J309 Allergic rhinitis, unspecified: Secondary | ICD-10-CM

## 2011-11-08 ENCOUNTER — Telehealth (INDEPENDENT_AMBULATORY_CARE_PROVIDER_SITE_OTHER): Payer: Self-pay | Admitting: General Surgery

## 2011-11-08 NOTE — Telephone Encounter (Signed)
Calling stating the patient has had pain around pacemaker x 1 week. She had mastectomy on the right (same side as pacemaker) in December 2012. I advised she first follow up with cardiology to evaluate this pain around her pacemaker. She will call back as needed.

## 2011-11-11 ENCOUNTER — Ambulatory Visit (INDEPENDENT_AMBULATORY_CARE_PROVIDER_SITE_OTHER): Payer: Medicare Other

## 2011-11-11 DIAGNOSIS — J309 Allergic rhinitis, unspecified: Secondary | ICD-10-CM

## 2011-11-18 ENCOUNTER — Ambulatory Visit (INDEPENDENT_AMBULATORY_CARE_PROVIDER_SITE_OTHER): Payer: Medicare Other

## 2011-11-18 DIAGNOSIS — J309 Allergic rhinitis, unspecified: Secondary | ICD-10-CM

## 2011-11-25 ENCOUNTER — Ambulatory Visit (INDEPENDENT_AMBULATORY_CARE_PROVIDER_SITE_OTHER): Payer: Medicare Other

## 2011-11-25 DIAGNOSIS — J309 Allergic rhinitis, unspecified: Secondary | ICD-10-CM

## 2011-11-26 ENCOUNTER — Ambulatory Visit: Payer: Medicare Other | Admitting: Internal Medicine

## 2011-12-02 ENCOUNTER — Ambulatory Visit (INDEPENDENT_AMBULATORY_CARE_PROVIDER_SITE_OTHER): Payer: Medicare Other

## 2011-12-02 DIAGNOSIS — J309 Allergic rhinitis, unspecified: Secondary | ICD-10-CM

## 2011-12-09 ENCOUNTER — Ambulatory Visit (INDEPENDENT_AMBULATORY_CARE_PROVIDER_SITE_OTHER): Payer: Medicare Other

## 2011-12-09 DIAGNOSIS — J309 Allergic rhinitis, unspecified: Secondary | ICD-10-CM

## 2011-12-16 ENCOUNTER — Ambulatory Visit (INDEPENDENT_AMBULATORY_CARE_PROVIDER_SITE_OTHER): Payer: Medicare Other

## 2011-12-16 DIAGNOSIS — J309 Allergic rhinitis, unspecified: Secondary | ICD-10-CM

## 2011-12-23 ENCOUNTER — Ambulatory Visit (INDEPENDENT_AMBULATORY_CARE_PROVIDER_SITE_OTHER): Payer: Medicare Other

## 2011-12-23 DIAGNOSIS — J309 Allergic rhinitis, unspecified: Secondary | ICD-10-CM

## 2011-12-29 ENCOUNTER — Ambulatory Visit (INDEPENDENT_AMBULATORY_CARE_PROVIDER_SITE_OTHER): Payer: Medicare Other

## 2011-12-29 DIAGNOSIS — J309 Allergic rhinitis, unspecified: Secondary | ICD-10-CM

## 2012-01-06 ENCOUNTER — Ambulatory Visit (INDEPENDENT_AMBULATORY_CARE_PROVIDER_SITE_OTHER): Payer: Medicare Other

## 2012-01-06 DIAGNOSIS — J309 Allergic rhinitis, unspecified: Secondary | ICD-10-CM

## 2012-01-13 ENCOUNTER — Ambulatory Visit (INDEPENDENT_AMBULATORY_CARE_PROVIDER_SITE_OTHER): Payer: Medicare Other

## 2012-01-13 DIAGNOSIS — J309 Allergic rhinitis, unspecified: Secondary | ICD-10-CM

## 2012-01-20 ENCOUNTER — Ambulatory Visit (INDEPENDENT_AMBULATORY_CARE_PROVIDER_SITE_OTHER): Payer: Medicare Other

## 2012-01-20 DIAGNOSIS — J309 Allergic rhinitis, unspecified: Secondary | ICD-10-CM

## 2012-01-27 ENCOUNTER — Ambulatory Visit (INDEPENDENT_AMBULATORY_CARE_PROVIDER_SITE_OTHER): Payer: Medicare Other

## 2012-01-27 DIAGNOSIS — J309 Allergic rhinitis, unspecified: Secondary | ICD-10-CM

## 2012-01-28 ENCOUNTER — Ambulatory Visit (INDEPENDENT_AMBULATORY_CARE_PROVIDER_SITE_OTHER): Payer: Medicare Other

## 2012-01-28 DIAGNOSIS — J309 Allergic rhinitis, unspecified: Secondary | ICD-10-CM

## 2012-02-01 ENCOUNTER — Encounter: Payer: Self-pay | Admitting: Internal Medicine

## 2012-02-04 ENCOUNTER — Ambulatory Visit (INDEPENDENT_AMBULATORY_CARE_PROVIDER_SITE_OTHER): Payer: Medicare Other

## 2012-02-04 DIAGNOSIS — J309 Allergic rhinitis, unspecified: Secondary | ICD-10-CM

## 2012-02-11 ENCOUNTER — Ambulatory Visit (INDEPENDENT_AMBULATORY_CARE_PROVIDER_SITE_OTHER): Payer: Medicare Other

## 2012-02-11 DIAGNOSIS — J309 Allergic rhinitis, unspecified: Secondary | ICD-10-CM

## 2012-02-18 ENCOUNTER — Ambulatory Visit (INDEPENDENT_AMBULATORY_CARE_PROVIDER_SITE_OTHER): Payer: Medicare Other

## 2012-02-18 DIAGNOSIS — J309 Allergic rhinitis, unspecified: Secondary | ICD-10-CM

## 2012-02-25 ENCOUNTER — Ambulatory Visit (INDEPENDENT_AMBULATORY_CARE_PROVIDER_SITE_OTHER): Payer: Medicare Other

## 2012-02-25 DIAGNOSIS — J309 Allergic rhinitis, unspecified: Secondary | ICD-10-CM

## 2012-03-03 ENCOUNTER — Ambulatory Visit (INDEPENDENT_AMBULATORY_CARE_PROVIDER_SITE_OTHER): Payer: Medicare Other

## 2012-03-03 DIAGNOSIS — J309 Allergic rhinitis, unspecified: Secondary | ICD-10-CM

## 2012-03-09 ENCOUNTER — Ambulatory Visit (INDEPENDENT_AMBULATORY_CARE_PROVIDER_SITE_OTHER): Payer: Medicare Other

## 2012-03-09 DIAGNOSIS — J309 Allergic rhinitis, unspecified: Secondary | ICD-10-CM

## 2012-03-10 ENCOUNTER — Ambulatory Visit: Payer: Medicare Other

## 2012-03-17 ENCOUNTER — Ambulatory Visit: Payer: Medicare Other

## 2012-03-22 ENCOUNTER — Other Ambulatory Visit: Payer: Self-pay | Admitting: *Deleted

## 2012-03-22 DIAGNOSIS — C50219 Malignant neoplasm of upper-inner quadrant of unspecified female breast: Secondary | ICD-10-CM

## 2012-03-23 ENCOUNTER — Other Ambulatory Visit: Payer: Medicare Other | Admitting: Lab

## 2012-03-23 ENCOUNTER — Ambulatory Visit (INDEPENDENT_AMBULATORY_CARE_PROVIDER_SITE_OTHER): Payer: Medicare Other

## 2012-03-23 DIAGNOSIS — C50219 Malignant neoplasm of upper-inner quadrant of unspecified female breast: Secondary | ICD-10-CM

## 2012-03-23 DIAGNOSIS — J309 Allergic rhinitis, unspecified: Secondary | ICD-10-CM

## 2012-03-23 LAB — COMPREHENSIVE METABOLIC PANEL (CC13)
ALT: 15 U/L (ref 0–55)
AST: 20 U/L (ref 5–34)
Albumin: 3.8 g/dL (ref 3.5–5.0)
Alkaline Phosphatase: 38 U/L — ABNORMAL LOW (ref 40–150)
BUN: 17.9 mg/dL (ref 7.0–26.0)
CO2: 27 mEq/L (ref 22–29)
Calcium: 9.7 mg/dL (ref 8.4–10.4)
Chloride: 104 mEq/L (ref 98–107)
Creatinine: 1 mg/dL (ref 0.6–1.1)
Glucose: 103 mg/dl — ABNORMAL HIGH (ref 70–99)
Potassium: 3.7 mEq/L (ref 3.5–5.1)
Sodium: 140 mEq/L (ref 136–145)
Total Bilirubin: 0.44 mg/dL (ref 0.20–1.20)
Total Protein: 7.4 g/dL (ref 6.4–8.3)

## 2012-03-23 LAB — CBC WITH DIFFERENTIAL/PLATELET
BASO%: 0.6 % (ref 0.0–2.0)
Basophils Absolute: 0 10*3/uL (ref 0.0–0.1)
EOS%: 5.7 % (ref 0.0–7.0)
Eosinophils Absolute: 0.4 10*3/uL (ref 0.0–0.5)
HCT: 35.9 % (ref 34.8–46.6)
HGB: 12.3 g/dL (ref 11.6–15.9)
LYMPH%: 35.1 % (ref 14.0–49.7)
MCH: 29.5 pg (ref 25.1–34.0)
MCHC: 34.3 g/dL (ref 31.5–36.0)
MCV: 86.1 fL (ref 79.5–101.0)
MONO#: 0.5 10*3/uL (ref 0.1–0.9)
MONO%: 7.5 % (ref 0.0–14.0)
NEUT#: 3.2 10*3/uL (ref 1.5–6.5)
NEUT%: 51.1 % (ref 38.4–76.8)
Platelets: 156 10*3/uL (ref 145–400)
RBC: 4.17 10*6/uL (ref 3.70–5.45)
RDW: 13.5 % (ref 11.2–14.5)
WBC: 6.3 10*3/uL (ref 3.9–10.3)
lymph#: 2.2 10*3/uL (ref 0.9–3.3)

## 2012-03-30 ENCOUNTER — Ambulatory Visit (INDEPENDENT_AMBULATORY_CARE_PROVIDER_SITE_OTHER): Payer: Medicare Other

## 2012-03-30 ENCOUNTER — Ambulatory Visit (HOSPITAL_BASED_OUTPATIENT_CLINIC_OR_DEPARTMENT_OTHER): Payer: Medicare Other | Admitting: Oncology

## 2012-03-30 VITALS — BP 134/86 | HR 80 | Temp 97.5°F | Resp 20 | Ht 63.0 in | Wt 157.2 lb

## 2012-03-30 DIAGNOSIS — M899 Disorder of bone, unspecified: Secondary | ICD-10-CM

## 2012-03-30 DIAGNOSIS — J309 Allergic rhinitis, unspecified: Secondary | ICD-10-CM

## 2012-03-30 DIAGNOSIS — G20 Parkinson's disease: Secondary | ICD-10-CM

## 2012-03-30 DIAGNOSIS — Z170 Estrogen receptor positive status [ER+]: Secondary | ICD-10-CM

## 2012-03-30 DIAGNOSIS — C50219 Malignant neoplasm of upper-inner quadrant of unspecified female breast: Secondary | ICD-10-CM

## 2012-03-30 MED ORDER — LIDOCAINE 5 % EX PTCH: 1.0000 | MEDICATED_PATCH | CUTANEOUS | Status: DC

## 2012-03-30 NOTE — Progress Notes (Signed)
ID: Debra Lynn   DOB: 07/29/1928  MR#: [id number]  WUJ#:[id number]  PCP: Johny Blamer, MD GYN: SUEmelia Loron OTHER NF:AOZHYQM [name]  HISTORY OF PRESENT ILLNESS: The patient had routine screening mammography 12/15/2010 at [facility]. She has heterogeneously dense breast tissue. There was a suggestion of a new mass in the upper inner quadrant of the right breast and she was brought back November 16 for right diagnostic mammography and ultrasonography. Spot compression views confirmed a 1.8 cm multilobulated mass which by ultrasound measured 2.1 cm, was ill-defined and hypoechoic. Biopsies November 19 ([id number]) showed an invasive ductal carcinoma, grade 2, strongly e-cadherin positive, estrogen receptor 90% positive, progesterone receptor negative, with no HER to amplification by CISH. The MIB-1 was 25%.  The patient is not a candidate for MRI because she has a pacemaker in place. Accordingly she underwent BSGI, showing a solitary focus of increased uptake, measuring 2.3 cm. With this information she was seen at the [facility] and a recommendation tomproceed with surgery was made. She had a Right simple mastectomy 01/15/2011. Her subsequent history is as detailed below.   INTERVAL HISTORY: Debra Lynn returns today for followup of her breast cancer accompanied by her daughter [name].. Since her last visit here she was evaluated at [facility] and now has a diagnosis of Parkinson's disease. She is being treated with rehabilitation. She feels that is helping.  REVIEW OF SYSTEMS: She has had no further seizures. She has pain in the right mastectomy area. This is mostly beneath the incision. There has been no fever, bleeding, or rash. She short of breath when walking up stairs. Otherwise she has no complaints. Her daughter tells me that her movements are little bit better, but overall a detailed review of systems today was stable  PAST MEDICAL HISTORY: Past Medical History  Diagnosis Date  . Sick  sinus syndrome     with pacemaker and ablation  . Stroke 2010  . Glaucoma   . Seasonal allergic rhinitis     Pos Skin Test 07-05-07  . GERD (gastroesophageal reflux disease)   . Atrial fibrillation   . Hypertension   . Hyperlipidemia   . Cancer bREAST CA(12/2010)  . Chronic kidney disease TUMOR ON RIGHT KIDNEY-OBSERVING  . Arthritis KNEES &BACK  . Neuromuscular disorder SLIGHT TREMOR.  . Bleeding from breast     right    PAST SURGICAL HISTORY: Past Surgical History  Procedure Laterality Date  . Cholecystectomy    . Vesicovaginal fistula closure w/ tah    . Atrial ablation surgery    . Flexible bronchoscopy w/ upper endoscopy      and swallowing evaluation  . Pacemaker insertion    . Knee arthroscopy    . Cataract extraction    . Insert / replace / remove pacemaker  01/2007    [facility]  . Abdominal hysterectomy  1975    pt. unsure if laparascopic/vaginal/abdominal  . Breast surgery  01/2011    mastectomy    FAMILY HISTORY Family History  Problem Relation Age of Onset  . Asthma Brother   . Asthma Daughter   . Heart disease    . Cancer    . Heart failure Mother   . Cancer Father     brain  . Lymphoma Brother   . Diabetes Brother   . Hypertension Brother   The patient's father died at the age of 74 from central nervous system cancer. The patient's mother died at the age of 30. The patient  had one brother who had a history of lymphoma, but survives.  GYNECOLOGIC HISTORY: GX P2. 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 Her husband Melvyn Neth used to work for [employer]. Daughter [name] lives in [place] and is a retired Scientist, research (life sciences). [name] is a widow of my former patient "[name]" Cathleen Fears.) Daughter Chanique Duca lives in [place] where she works as a Human resources officer. The  patient has 2 grandsons. She is not a Advice worker.    ADVANCED DIRECTIVES: in place  HEALTH MAINTENANCE: History  Substance Use Topics  . Smoking status: Never Smoker   . Smokeless tobacco: Never Used  . Alcohol Use: No     Colonoscopy:  PAP:  Bone density: [facility] Jan 2013, T - 1.7 R fem neck  Lipid panel:  No Known Allergies  Current Outpatient Prescriptions  Medication Sig Dispense Refill  . acetaminophen (TYLENOL) 500 MG tablet Take 1,000 mg by mouth every 6 (six) hours as needed. For pain.      Marland Kitchen amLODipine (NORVASC) 2.5 MG tablet Take 1 tablet by mouth daily.      . benzonatate (TESSALON) 100 MG capsule Take 100 mg by mouth 3 (three) times daily as needed. For cough      . Calcium Carbonate-Vitamin D (CALCIUM-VITAMIN D) 500-200 MG-UNIT per tablet Take 1 tablet by mouth 2 (two) times daily with a meal.       . cephALEXin (KEFLEX) 250 MG capsule Take 1 capsule by mouth at bedtime.       . cetirizine (ZYRTEC) 10 MG tablet Take 10 mg by mouth at bedtime. For allergies      . colestipol (COLESTID) 1 G tablet Take 1 g by mouth at bedtime.      . levETIRAcetam (KEPPRA) 500 MG tablet Take 1 tablet (500 mg total) by mouth 2 (two) times daily.  60 tablet  0  . losartan-hydrochlorothiazide (HYZAAR) 100-25 MG per tablet Take 1 tablet by mouth daily.      . Multiple Vitamins-Minerals (CENTRUM SILVER) tablet Take 1 tablet by mouth daily.        . pantoprazole (PROTONIX) 40 MG tablet Take 1 tablet by mouth daily.      . potassium chloride SA (K-DUR,KLOR-CON) 20 MEQ tablet Take 1 tablet by mouth at bedtime.       . pravastatin (PRAVACHOL) 40 MG tablet Take 1 tablet by mouth at bedtime.       . Probiotic Product (ALIGN) 4 MG CAPS Take 4 mg by mouth daily.      . propranolol (INDERAL) 60 MG tablet Take 60 mg by mouth 2 (two) times daily.      . RESTASIS 0.05 % ophthalmic emulsion Place 1 drop into both eyes every 12 (twelve) hours.       Marland Kitchen warfarin (COUMADIN) 2.5 MG tablet Take 1.25-2.5 mg  by mouth every evening. Takes 2.5 mg on Mon Wed and Friday and a half tablet all other days       Current Facility-Administered Medications  Medication Dose Route Frequency Provider Last Rate Last Dose  . HYDROcodone-acetaminophen (NORCO) 5-325 MG per tablet 1-2 tablet  1-2 tablet Oral Q4H PRN Emelia Loron, MD      . propranolol (INDERAL LA) 24 hr capsule 60 mg  60 mg Oral Daily Emelia Loron, MD  OBJECTIVE: Elderly white female in no acute distress Filed Vitals:   03/30/12 0959  BP: 134/86  Pulse: 80  Temp: 97.5 F (36.4 C)  Resp: 20     Body mass index is 27.85 kg/(m^2).    ECOG FS: 2  Sclerae unicteric Oropharynx clear No cervical or supraclavicular adenopathy Lungs no rales or rhonchi, fair excursion bilaterally Heart regular rate and rhythm (paced) Abd benign MSK no edema; no focal spinal tenderness Neuro: Nonfocal; fatal features are not rigid; she walks with a shuffling gait, but was able to get from chair up on examination table unassisted. She does not appear depressed, speaks in full sentences, is well oriented. Breasts: The right breast is status post mastectomy. The area of tenderness is beneath the scar in a band extending approximately 1 inch. There is no evidence of local recurrence. Her pacemaker is in the right chest wall slightly above the incision. Left breast is unremarkable.  LAB RESULTS: Lab Results  Component Value Date   WBC 6.3 03/23/2012   NEUTROABS 3.2 03/23/2012   HGB 12.3 03/23/2012   HCT 35.9 03/23/2012   MCV 86.1 03/23/2012   PLT 156 03/23/2012      Chemistry      Component Value Date/Time   NA 140 03/23/2012 1329   NA 139 09/20/2011 1033   K 3.7 03/23/2012 1329   K 3.9 09/20/2011 1033   CL 104 03/23/2012 1329   CL 101 09/20/2011 1033   CO2 27 03/23/2012 1329   CO2 28 09/20/2011 1033   BUN 17.9 03/23/2012 1329   BUN 16 09/20/2011 1033   CREATININE 1.0 03/23/2012 1329   CREATININE 0.97 09/20/2011 1033      Component Value Date/Time    CALCIUM 9.7 03/23/2012 1329   CALCIUM 9.8 09/20/2011 1033   ALKPHOS 38* 03/23/2012 1329   ALKPHOS 41 09/20/2011 1033   AST 20 03/23/2012 1329   AST 19 09/20/2011 1033   ALT 15 03/23/2012 1329   ALT 15 09/20/2011 1033   BILITOT 0.44 03/23/2012 1329   BILITOT 0.5 09/20/2011 1033       Lab Results  Component Value Date   LABCA2 15 12/30/2010    No components found with this basename: [id number]    No results found for this basename: INR,  in the last 168 hours  Urinalysis    Component Value Date/Time   COLORURINE YELLOW 02/06/2008 1014   APPEARANCEUR CLOUDY* 02/06/2008 1014   LABSPEC 1.017 02/06/2008 1014   PHURINE 7.0 02/06/2008 1014   GLUCOSEU NEGATIVE 02/06/2008 1014   HGBUR LARGE* 02/06/2008 1014   BILIRUBINUR NEGATIVE 02/06/2008 1014   KETONESUR NEGATIVE 02/06/2008 1014   PROTEINUR 30* 02/06/2008 1014   UROBILINOGEN 0.2 02/06/2008 1014   NITRITE NEGATIVE 02/06/2008 1014   LEUKOCYTESUR LARGE* 02/06/2008 1014    STUDIES: No results found.  ASSESSMENT: 79 y.o. [place] woman status post simple mastectomy 01/15/2011 for a T2 NX (stage II) invasive ductal carcinoma, grade 3, 100% estrogen receptor positive, with a borderline MIB-1, progesterone receptor and HER-2 negative.  (1) not a candidate for tamoxifen due to cardiac issues and decided against aromatase inhibitors because of osteopenia present at baseline and concerns re. bisphosphonates   PLAN: Nilani is doing well from a breast cancer point of view, with no evidence of disease activity. Given her other medical problems, I am comfortable seeing her on a once a year basis especially if she sees Dr. Dwain Sarna 6 months from now or so. [name] also is very much in  favor of that decision. Her next mammogram will be in November. She knows to call for any problems that may develop before the next visit here.  Cressida Milford C    03/30/2012

## 2012-04-06 ENCOUNTER — Ambulatory Visit (INDEPENDENT_AMBULATORY_CARE_PROVIDER_SITE_OTHER): Payer: Medicare Other

## 2012-04-06 DIAGNOSIS — J309 Allergic rhinitis, unspecified: Secondary | ICD-10-CM

## 2012-04-14 ENCOUNTER — Ambulatory Visit (INDEPENDENT_AMBULATORY_CARE_PROVIDER_SITE_OTHER): Payer: Medicare Other

## 2012-04-14 DIAGNOSIS — J309 Allergic rhinitis, unspecified: Secondary | ICD-10-CM

## 2012-04-21 ENCOUNTER — Ambulatory Visit (INDEPENDENT_AMBULATORY_CARE_PROVIDER_SITE_OTHER): Payer: Medicare Other

## 2012-04-21 DIAGNOSIS — J309 Allergic rhinitis, unspecified: Secondary | ICD-10-CM

## 2012-04-27 ENCOUNTER — Ambulatory Visit (INDEPENDENT_AMBULATORY_CARE_PROVIDER_SITE_OTHER): Payer: Medicare Other

## 2012-04-27 ENCOUNTER — Telehealth (INDEPENDENT_AMBULATORY_CARE_PROVIDER_SITE_OTHER): Payer: Self-pay

## 2012-04-27 DIAGNOSIS — J309 Allergic rhinitis, unspecified: Secondary | ICD-10-CM

## 2012-04-27 NOTE — Telephone Encounter (Signed)
LMOM for pt to call me b/c I want to cancel her appt in April with Dr Dwain Sarna. The pt saw Dr Darnelle Catalan the end of February and he recommended pt to be seen by Dr Dwain Sarna in 6 months which would be the end of August. I have put the pt on our recall system for [place] to call her to make an appt with [place] for August. We are trying to get the pt spread out thru the year to be seen every 6 months by her doctor. The pt will be due in November for her mgm and the due in Feb. 2015 to see Dr Darnelle Catalan again. I wanted to talk to the pt first before I just canceled the appt on 05/23/12 to explain how this is going to work.

## 2012-04-27 NOTE — Telephone Encounter (Signed)
Pt's daughter, [name], called in response to call from [facility].  Per daughter, Dr. Darnelle Catalan has ordered a MGM for 529/14 and wants her to see Dr. Dwain Sarna after that.  She assumed that meant more like June than August.  Please set up appt with daughter; her cell # is [phone].

## 2012-04-28 ENCOUNTER — Telehealth (INDEPENDENT_AMBULATORY_CARE_PROVIDER_SITE_OTHER): Payer: Self-pay

## 2012-04-28 ENCOUNTER — Ambulatory Visit: Payer: Medicare Other

## 2012-04-28 NOTE — Telephone Encounter (Signed)
Called daughter to try and explain our 41month rotation for the pt to be seen but the pt is scheduled for a mgm in May and wants to be seen by Dr Dwain Sarna after that mgm. I made her an appt for 07/03/12 and the daughter agree's.

## 2012-05-04 ENCOUNTER — Ambulatory Visit: Payer: Medicare Other

## 2012-05-05 ENCOUNTER — Ambulatory Visit (INDEPENDENT_AMBULATORY_CARE_PROVIDER_SITE_OTHER): Payer: Medicare Other

## 2012-05-05 DIAGNOSIS — J309 Allergic rhinitis, unspecified: Secondary | ICD-10-CM

## 2012-05-08 ENCOUNTER — Encounter: Payer: Self-pay | Admitting: Internal Medicine

## 2012-05-11 ENCOUNTER — Ambulatory Visit: Payer: Medicare Other

## 2012-05-12 ENCOUNTER — Ambulatory Visit (INDEPENDENT_AMBULATORY_CARE_PROVIDER_SITE_OTHER): Payer: Medicare Other

## 2012-05-12 DIAGNOSIS — J309 Allergic rhinitis, unspecified: Secondary | ICD-10-CM

## 2012-05-18 ENCOUNTER — Ambulatory Visit (INDEPENDENT_AMBULATORY_CARE_PROVIDER_SITE_OTHER): Payer: Medicare Other

## 2012-05-18 DIAGNOSIS — J309 Allergic rhinitis, unspecified: Secondary | ICD-10-CM

## 2012-05-23 ENCOUNTER — Ambulatory Visit (INDEPENDENT_AMBULATORY_CARE_PROVIDER_SITE_OTHER): Payer: Medicare Other | Admitting: General Surgery

## 2012-05-26 ENCOUNTER — Ambulatory Visit (INDEPENDENT_AMBULATORY_CARE_PROVIDER_SITE_OTHER): Payer: Medicare Other

## 2012-05-26 DIAGNOSIS — J309 Allergic rhinitis, unspecified: Secondary | ICD-10-CM

## 2012-06-01 ENCOUNTER — Ambulatory Visit (INDEPENDENT_AMBULATORY_CARE_PROVIDER_SITE_OTHER): Payer: Medicare Other

## 2012-06-01 DIAGNOSIS — J309 Allergic rhinitis, unspecified: Secondary | ICD-10-CM

## 2012-06-02 ENCOUNTER — Ambulatory Visit (INDEPENDENT_AMBULATORY_CARE_PROVIDER_SITE_OTHER): Payer: Medicare Other

## 2012-06-02 ENCOUNTER — Ambulatory Visit: Payer: Medicare Other

## 2012-06-02 DIAGNOSIS — J309 Allergic rhinitis, unspecified: Secondary | ICD-10-CM

## 2012-06-08 ENCOUNTER — Ambulatory Visit (INDEPENDENT_AMBULATORY_CARE_PROVIDER_SITE_OTHER): Payer: Medicare Other

## 2012-06-08 DIAGNOSIS — J309 Allergic rhinitis, unspecified: Secondary | ICD-10-CM

## 2012-06-15 ENCOUNTER — Ambulatory Visit (INDEPENDENT_AMBULATORY_CARE_PROVIDER_SITE_OTHER): Payer: Medicare Other

## 2012-06-15 DIAGNOSIS — J309 Allergic rhinitis, unspecified: Secondary | ICD-10-CM

## 2012-06-22 ENCOUNTER — Ambulatory Visit (INDEPENDENT_AMBULATORY_CARE_PROVIDER_SITE_OTHER): Payer: Medicare Other

## 2012-06-22 DIAGNOSIS — J309 Allergic rhinitis, unspecified: Secondary | ICD-10-CM

## 2012-06-29 ENCOUNTER — Ambulatory Visit (INDEPENDENT_AMBULATORY_CARE_PROVIDER_SITE_OTHER): Payer: Medicare Other

## 2012-06-29 DIAGNOSIS — J309 Allergic rhinitis, unspecified: Secondary | ICD-10-CM

## 2012-07-03 ENCOUNTER — Encounter (INDEPENDENT_AMBULATORY_CARE_PROVIDER_SITE_OTHER): Payer: Self-pay | Admitting: General Surgery

## 2012-07-03 ENCOUNTER — Ambulatory Visit (INDEPENDENT_AMBULATORY_CARE_PROVIDER_SITE_OTHER): Payer: Medicare Other | Admitting: General Surgery

## 2012-07-03 VITALS — BP 128/76 | HR 64 | Temp 98.0°F | Resp 18 | Ht 64.0 in | Wt 157.0 lb

## 2012-07-03 DIAGNOSIS — Z853 Personal history of malignant neoplasm of breast: Secondary | ICD-10-CM

## 2012-07-03 NOTE — Progress Notes (Signed)
Subjective:     Patient ID: Debra Lynn, female   DOB: [birth year], 79 y.o.   MRN: [id number]  HPI This is an 79 year old female with a history of a right breast cancer status post a right simple mastectomy. This was a 2.1 cm invasive ductal carcinoma that was ER-positive, PR negative, HER-2/neu is not amplified. She has not undergone antiestrogen therapy due to her cormorbidities. Since the surgery she has had some soreness at her surgical site but this is doing okay right now she says. She is able to move her arm just fine and did not have any swelling of her arm. She did not undergo a node biopsy. She has been diagnosed with Parkinson's and has a variety of other medical problems right now. She had mammogram in November of 2013 which shows a new retroareolar left breast calcifications. These were then followed up in 6 months when they appeared to be stable and she is due for another mammogram 6 months from now. She comes in with her daughter [name] today for followup.  Review of Systems     Objective:   Physical Exam  Vitals reviewed. Constitutional: She appears well-developed and well-nourished.  Neck: Neck supple.  Cardiovascular: Normal rate, regular rhythm and normal heart sounds.   Pulmonary/Chest: Effort normal and breath sounds normal. She has no wheezes. She has no rales. Right breast exhibits no mass. Left breast exhibits no inverted nipple, no mass, no nipple discharge, no skin change and no tenderness.    Lymphadenopathy:    She has no cervical adenopathy.    She has no axillary adenopathy.       Right: No supraclavicular adenopathy present.       Left: No supraclavicular adenopathy present.       Assessment:     History breast cancer     Plan:     She has no clinical evidence of recurrence. I think she has a small nodule that is just some scar tissue at the medial aspect of her mastectomy incision. This has been stable. I don't think this merits of further workup right  now is a pretty clearly a scar tissue to me today. She's going to get a mammogram again in 6 months. I will plan on seeing her back in one year unless she needs something sooner.

## 2012-07-06 ENCOUNTER — Ambulatory Visit (INDEPENDENT_AMBULATORY_CARE_PROVIDER_SITE_OTHER): Payer: Medicare Other

## 2012-07-06 DIAGNOSIS — J309 Allergic rhinitis, unspecified: Secondary | ICD-10-CM

## 2012-07-10 ENCOUNTER — Encounter (INDEPENDENT_AMBULATORY_CARE_PROVIDER_SITE_OTHER): Payer: Self-pay

## 2012-07-14 ENCOUNTER — Ambulatory Visit: Payer: Medicare Other

## 2012-07-20 ENCOUNTER — Ambulatory Visit (INDEPENDENT_AMBULATORY_CARE_PROVIDER_SITE_OTHER): Payer: Medicare Other

## 2012-07-20 DIAGNOSIS — J309 Allergic rhinitis, unspecified: Secondary | ICD-10-CM

## 2012-07-27 ENCOUNTER — Ambulatory Visit (INDEPENDENT_AMBULATORY_CARE_PROVIDER_SITE_OTHER): Payer: Medicare Other

## 2012-07-27 DIAGNOSIS — J309 Allergic rhinitis, unspecified: Secondary | ICD-10-CM

## 2012-08-03 ENCOUNTER — Ambulatory Visit (INDEPENDENT_AMBULATORY_CARE_PROVIDER_SITE_OTHER): Payer: Medicare Other

## 2012-08-03 ENCOUNTER — Ambulatory Visit (INDEPENDENT_AMBULATORY_CARE_PROVIDER_SITE_OTHER): Payer: Self-pay | Admitting: *Deleted

## 2012-08-03 DIAGNOSIS — J309 Allergic rhinitis, unspecified: Secondary | ICD-10-CM

## 2012-08-03 DIAGNOSIS — I6350 Cerebral infarction due to unspecified occlusion or stenosis of unspecified cerebral artery: Secondary | ICD-10-CM

## 2012-08-03 NOTE — Progress Notes (Signed)
Participant in the office today for her 4th. Annual study visit for IRIS study. MMSE was successfully completed without problems.  Blood was drawn and shipped.  Participant to continue with study visits per protocol.

## 2012-08-11 ENCOUNTER — Ambulatory Visit (INDEPENDENT_AMBULATORY_CARE_PROVIDER_SITE_OTHER): Payer: Medicare Other

## 2012-08-11 DIAGNOSIS — J309 Allergic rhinitis, unspecified: Secondary | ICD-10-CM

## 2012-08-17 ENCOUNTER — Ambulatory Visit (INDEPENDENT_AMBULATORY_CARE_PROVIDER_SITE_OTHER): Payer: Medicare Other

## 2012-08-17 ENCOUNTER — Ambulatory Visit: Payer: Medicare Other

## 2012-08-17 DIAGNOSIS — J309 Allergic rhinitis, unspecified: Secondary | ICD-10-CM

## 2012-08-18 ENCOUNTER — Other Ambulatory Visit: Payer: Self-pay | Admitting: Internal Medicine

## 2012-08-18 NOTE — Telephone Encounter (Signed)
Last OV with [facility]: 05/27/11; was asked to f/u in 6 months. Pt has no pending OV. Per CDY: ok to fill but pt will need OV within the next few months. I have sent rx to pharm requesting to call for OV with [facility] for further refills.

## 2012-08-23 ENCOUNTER — Emergency Department (HOSPITAL_COMMUNITY): Payer: Medicare Other

## 2012-08-23 ENCOUNTER — Inpatient Hospital Stay (HOSPITAL_COMMUNITY)
Admission: EM | Admit: 2012-08-23 | Discharge: 2012-08-25 | DRG: 065 | Disposition: A | Discharge status: Home / Self Care | Payer: Medicare Other | Attending: Internal Medicine | Admitting: Internal Medicine

## 2012-08-23 ENCOUNTER — Encounter (HOSPITAL_COMMUNITY): Payer: Self-pay | Admitting: Emergency Medicine

## 2012-08-23 DIAGNOSIS — Z950 Presence of cardiac pacemaker: Secondary | ICD-10-CM

## 2012-08-23 DIAGNOSIS — R29898 Other symptoms and signs involving the musculoskeletal system: Secondary | ICD-10-CM | POA: Diagnosis present

## 2012-08-23 DIAGNOSIS — I495 Sick sinus syndrome: Secondary | ICD-10-CM

## 2012-08-23 DIAGNOSIS — I4891 Unspecified atrial fibrillation: Secondary | ICD-10-CM

## 2012-08-23 DIAGNOSIS — I442 Atrioventricular block, complete: Secondary | ICD-10-CM | POA: Diagnosis present

## 2012-08-23 DIAGNOSIS — Z7901 Long term (current) use of anticoagulants: Secondary | ICD-10-CM

## 2012-08-23 DIAGNOSIS — H409 Unspecified glaucoma: Secondary | ICD-10-CM | POA: Diagnosis present

## 2012-08-23 DIAGNOSIS — E785 Hyperlipidemia, unspecified: Secondary | ICD-10-CM

## 2012-08-23 DIAGNOSIS — R05 Cough: Secondary | ICD-10-CM

## 2012-08-23 DIAGNOSIS — N189 Chronic kidney disease, unspecified: Secondary | ICD-10-CM | POA: Diagnosis present

## 2012-08-23 DIAGNOSIS — J328 Other chronic sinusitis: Secondary | ICD-10-CM

## 2012-08-23 DIAGNOSIS — J301 Allergic rhinitis due to pollen: Secondary | ICD-10-CM

## 2012-08-23 DIAGNOSIS — I6350 Cerebral infarction due to unspecified occlusion or stenosis of unspecified cerebral artery: Principal | ICD-10-CM

## 2012-08-23 DIAGNOSIS — I639 Cerebral infarction, unspecified: Secondary | ICD-10-CM

## 2012-08-23 DIAGNOSIS — G709 Myoneural disorder, unspecified: Secondary | ICD-10-CM | POA: Diagnosis present

## 2012-08-23 DIAGNOSIS — R29810 Facial weakness: Secondary | ICD-10-CM | POA: Diagnosis present

## 2012-08-23 DIAGNOSIS — I10 Essential (primary) hypertension: Secondary | ICD-10-CM

## 2012-08-23 DIAGNOSIS — R059 Cough, unspecified: Secondary | ICD-10-CM

## 2012-08-23 DIAGNOSIS — K219 Gastro-esophageal reflux disease without esophagitis: Secondary | ICD-10-CM

## 2012-08-23 DIAGNOSIS — R471 Dysarthria and anarthria: Secondary | ICD-10-CM | POA: Diagnosis present

## 2012-08-23 DIAGNOSIS — I129 Hypertensive chronic kidney disease with stage 1 through stage 4 chronic kidney disease, or unspecified chronic kidney disease: Secondary | ICD-10-CM | POA: Diagnosis present

## 2012-08-23 DIAGNOSIS — N390 Urinary tract infection, site not specified: Secondary | ICD-10-CM | POA: Diagnosis present

## 2012-08-23 HISTORY — DX: Cerebral infarction, unspecified: I63.9

## 2012-08-23 LAB — CBC
HCT: 37.2 % (ref 36.0–46.0)
Hemoglobin: 13 g/dL (ref 12.0–15.0)
MCH: 30.4 pg (ref 26.0–34.0)
MCHC: 34.9 g/dL (ref 30.0–36.0)
MCV: 86.9 fL (ref 78.0–100.0)
Platelets: 153 10*3/uL (ref 150–400)
RBC: 4.28 MIL/uL (ref 3.87–5.11)
RDW: 14 % (ref 11.5–15.5)
WBC: 6.9 10*3/uL (ref 4.0–10.5)

## 2012-08-23 LAB — COMPREHENSIVE METABOLIC PANEL
ALT: 13 U/L (ref 0–35)
AST: 23 U/L (ref 0–37)
Albumin: 3.9 g/dL (ref 3.5–5.2)
Alkaline Phosphatase: 41 U/L (ref 39–117)
BUN: 16 mg/dL (ref 6–23)
CO2: 27 mEq/L (ref 19–32)
Calcium: 9.8 mg/dL (ref 8.4–10.5)
Chloride: 100 mEq/L (ref 96–112)
Creatinine, Ser: 0.99 mg/dL (ref 0.50–1.10)
GFR calc Af Amer: 60 mL/min — ABNORMAL LOW (ref >90)
GFR calc non Af Amer: 52 mL/min — ABNORMAL LOW (ref >90)
Glucose, Bld: 97 mg/dL (ref 70–99)
Potassium: 4.1 mEq/L (ref 3.5–5.1)
Sodium: 138 mEq/L (ref 135–145)
Total Bilirubin: 0.3 mg/dL (ref 0.3–1.2)
Total Protein: 7.5 g/dL (ref 6.0–8.3)

## 2012-08-23 LAB — URINALYSIS, ROUTINE W REFLEX MICROSCOPIC
Bilirubin Urine: NEGATIVE
Glucose, UA: NEGATIVE mg/dL
Hgb urine dipstick: NEGATIVE
Ketones, ur: NEGATIVE mg/dL
Nitrite: NEGATIVE
Protein, ur: 30 mg/dL — AB
Specific Gravity, Urine: 1.01 (ref 1.005–1.030)
Urobilinogen, UA: 0.2 mg/dL (ref 0.0–1.0)
pH: 7 (ref 5.0–8.0)

## 2012-08-23 LAB — POCT I-STAT, CHEM 8
BUN: 18 mg/dL (ref 6–23)
Calcium, Ion: 1.18 mmol/L (ref 1.13–1.30)
Chloride: 102 mEq/L (ref 96–112)
Creatinine, Ser: 1.1 mg/dL (ref 0.50–1.10)
Glucose, Bld: 98 mg/dL (ref 70–99)
HCT: 39 % (ref 36.0–46.0)
Hemoglobin: 13.3 g/dL (ref 12.0–15.0)
Potassium: 4 mEq/L (ref 3.5–5.1)
Sodium: 142 mEq/L (ref 135–145)
TCO2: 28 mmol/L (ref 0–100)

## 2012-08-23 LAB — RAPID URINE DRUG SCREEN, HOSP PERFORMED
Amphetamines: NOT DETECTED
Barbiturates: NOT DETECTED
Benzodiazepines: NOT DETECTED
Cocaine: NOT DETECTED
Opiates: NOT DETECTED
Tetrahydrocannabinol: NOT DETECTED

## 2012-08-23 LAB — DIFFERENTIAL
Basophils Absolute: 0.1 10*3/uL (ref 0.0–0.1)
Basophils Relative: 1 % (ref 0–1)
Eosinophils Absolute: 0.3 10*3/uL (ref 0.0–0.7)
Eosinophils Relative: 4 % (ref 0–5)
Lymphocytes Relative: 36 % (ref 12–46)
Lymphs Abs: 2.5 10*3/uL (ref 0.7–4.0)
Monocytes Absolute: 0.8 10*3/uL (ref 0.1–1.0)
Monocytes Relative: 12 % (ref 3–12)
Neutro Abs: 3.2 10*3/uL (ref 1.7–7.7)
Neutrophils Relative %: 47 % (ref 43–77)

## 2012-08-23 LAB — URINE MICROSCOPIC-ADD ON

## 2012-08-23 LAB — PROTIME-INR
INR: 2.45 — ABNORMAL HIGH (ref 0.00–1.49)
Prothrombin Time: 25.8 seconds — ABNORMAL HIGH (ref 11.6–15.2)

## 2012-08-23 LAB — GLUCOSE, CAPILLARY: Glucose-Capillary: 101 mg/dL — ABNORMAL HIGH (ref 70–99)

## 2012-08-23 LAB — APTT: aPTT: 38 seconds — ABNORMAL HIGH (ref 24–37)

## 2012-08-23 LAB — TROPONIN I: Troponin I: <0.3 ng/mL (ref <0.30)

## 2012-08-23 LAB — ETHANOL: Alcohol, Ethyl (B): <11 mg/dL (ref 0–11)

## 2012-08-23 LAB — POCT I-STAT TROPONIN I: Troponin i, poc: 0.01 ng/mL (ref 0.00–0.08)

## 2012-08-23 MED ORDER — SIMVASTATIN 20 MG PO TABS
20.0000 mg | ORAL_TABLET | Freq: Every day | ORAL | Status: DC
  Filled 2012-08-23 (×2): qty 1

## 2012-08-23 MED ORDER — RISAQUAD PO CAPS
1.0000 | ORAL_CAPSULE | Freq: Every day | ORAL | Status: DC
  Administered 2012-08-24 – 2012-08-25 (×2): 1 via ORAL
  Filled 2012-08-23 (×2): qty 1

## 2012-08-23 MED ORDER — LOSARTAN POTASSIUM 25 MG PO TABS
25.0000 mg | ORAL_TABLET | ORAL | Status: DC
  Administered 2012-08-24: 25 mg via ORAL
  Filled 2012-08-23: qty 1

## 2012-08-23 MED ORDER — FUROSEMIDE 40 MG PO TABS
40.0000 mg | ORAL_TABLET | Freq: Every day | ORAL | Status: DC
  Administered 2012-08-24 – 2012-08-25 (×2): 40 mg via ORAL
  Filled 2012-08-23 (×2): qty 1

## 2012-08-23 MED ORDER — WARFARIN - PHARMACIST DOSING INPATIENT: Freq: Every day | Status: DC

## 2012-08-23 MED ORDER — CYCLOSPORINE 0.05 % OP EMUL
1.0000 [drp] | Freq: Two times a day (BID) | OPHTHALMIC | Status: DC
  Administered 2012-08-23: via OPHTHALMIC
  Administered 2012-08-24 – 2012-08-25 (×2): 1 [drp] via OPHTHALMIC
  Filled 2012-08-23 (×6): qty 1

## 2012-08-23 MED ORDER — ALIGN 4 MG PO CAPS: 4.0000 mg | ORAL_CAPSULE | Freq: Every day | ORAL | Status: DC

## 2012-08-23 MED ORDER — POTASSIUM CHLORIDE CRYS ER 20 MEQ PO TBCR
20.0000 meq | EXTENDED_RELEASE_TABLET | Freq: Every day | ORAL | Status: DC
  Administered 2012-08-23 – 2012-08-24 (×2): 20 meq via ORAL
  Filled 2012-08-23 (×3): qty 1

## 2012-08-23 MED ORDER — PROPRANOLOL HCL 60 MG PO TABS
60.0000 mg | ORAL_TABLET | Freq: Two times a day (BID) | ORAL | Status: DC
  Administered 2012-08-23 – 2012-08-25 (×4): 60 mg via ORAL
  Filled 2012-08-23 (×5): qty 1

## 2012-08-23 MED ORDER — CALCIUM CARBONATE-VITAMIN D 500-200 MG-UNIT PO TABS
1.0000 | ORAL_TABLET | Freq: Two times a day (BID) | ORAL | Status: DC
  Administered 2012-08-24 – 2012-08-25 (×3): 1 via ORAL
  Filled 2012-08-23 (×5): qty 1

## 2012-08-23 MED ORDER — LEVETIRACETAM 250 MG PO TABS
250.0000 mg | ORAL_TABLET | Freq: Two times a day (BID) | ORAL | Status: DC
  Administered 2012-08-23 – 2012-08-25 (×4): 250 mg via ORAL
  Filled 2012-08-23 (×6): qty 1

## 2012-08-23 MED ORDER — PANTOPRAZOLE SODIUM 40 MG PO TBEC
40.0000 mg | DELAYED_RELEASE_TABLET | Freq: Every day | ORAL | Status: DC
  Administered 2012-08-24 – 2012-08-25 (×2): 40 mg via ORAL
  Filled 2012-08-23 (×2): qty 1

## 2012-08-23 MED ORDER — COLESTIPOL HCL 1 G PO TABS
1.0000 g | ORAL_TABLET | Freq: Every day | ORAL | Status: DC
  Administered 2012-08-23 – 2012-08-24 (×2): 1 g via ORAL
  Filled 2012-08-23 (×3): qty 1

## 2012-08-23 MED ORDER — WARFARIN SODIUM 2.5 MG PO TABS
2.5000 mg | ORAL_TABLET | Freq: Once | ORAL | Status: DC
  Filled 2012-08-23: qty 1

## 2012-08-23 MED ORDER — CEPHALEXIN 250 MG PO CAPS
250.0000 mg | ORAL_CAPSULE | Freq: Every day | ORAL | Status: DC
  Administered 2012-08-23 – 2012-08-24 (×2): 250 mg via ORAL
  Filled 2012-08-23 (×3): qty 1

## 2012-08-23 MED ORDER — ACETAMINOPHEN 500 MG PO TABS
1000.0000 mg | ORAL_TABLET | Freq: Four times a day (QID) | ORAL | Status: DC | PRN
  Administered 2012-08-24: 1000 mg via ORAL
  Filled 2012-08-23 (×2): qty 2

## 2012-08-23 NOTE — Progress Notes (Signed)
Told pt and family that Pt will remain NPO until ST will do swallow eval in a.m "because per ED RN pt failed bedside swallow eval." Pt and family reacted and stated "nobody did a swallow eval in ED, that pt was not given anything by mouth."Bedside swallow eval done and pt passed.

## 2012-08-23 NOTE — Progress Notes (Signed)
ANTICOAGULATION CONSULT NOTE - Initial Consult  Pharmacy Consult for warfarin Indication: atrial fibrillation  No Known Allergies  Patient Measurements: Height: 5\' 3"  (160 cm) Weight: 154 lb 8.7 oz (70.1 kg) IBW/kg (Calculated) : 52.4  Vital Signs: Temp: 97.4 F (36.3 C) (07/23 2200) Temp src: Oral (07/23 2200) BP: 143/74 mmHg (07/23 2200) Pulse Rate: 67 (07/23 2200)  Labs:  Recent Labs  08/23/12 1928 08/23/12 1943  HGB 13.0 13.3  HCT 37.2 39.0  PLT 153  --   APTT 38*  --   LABPROT 25.8*  --   INR 2.45*  --   CREATININE 0.99 1.10  TROPONINI <0.30  --     Estimated Creatinine Clearance: 37.7 ml/min (by C-G formula based on Cr of 1.1).   Medical History: Past Medical History  Diagnosis Date  . Sick sinus syndrome     with pacemaker and ablation  . Stroke 2010  . Glaucoma   . Seasonal allergic rhinitis     Pos Skin Test 07-05-07  . GERD (gastroesophageal reflux disease)   . Atrial fibrillation   . Hypertension   . Hyperlipidemia   . Cancer bREAST CA(12/2010)  . Chronic kidney disease TUMOR ON RIGHT KIDNEY-OBSERVING  . Arthritis KNEES &BACK  . Neuromuscular disorder SLIGHT TREMOR.  . Bleeding from breast     right    Medications:  Facility-administered medications prior to admission  Medication Dose Route Frequency Provider Last Rate Last Dose  . HYDROcodone-acetaminophen (NORCO) 5-325 MG per tablet 1-2 tablet  1-2 tablet Oral Q4H PRN Emelia Loron, MD      . propranolol (INDERAL LA) 24 hr capsule 60 mg  60 mg Oral Daily Emelia Loron, MD       Prescriptions prior to admission  Medication Sig Dispense Refill  . acetaminophen (TYLENOL) 500 MG tablet Take 1,000 mg by mouth every 6 (six) hours as needed. For pain.      . benzonatate (TESSALON) 100 MG capsule Take 100 mg by mouth 3 (three) times daily as needed for cough.      . Calcium Carbonate-Vitamin D (CALCIUM-VITAMIN D) 500-200 MG-UNIT per tablet Take 1 tablet by mouth 2 (two) times daily with  a meal.       . cephALEXin (KEFLEX) 250 MG capsule Take 1 capsule by mouth at bedtime.       . colestipol (COLESTID) 1 G tablet Take 1 g by mouth at bedtime.      . furosemide (LASIX) 40 MG tablet Take 40 mg by mouth daily.       Marland Kitchen levETIRAcetam (KEPPRA) 500 MG tablet Take 250 mg by mouth every 12 (twelve) hours.      . lidocaine (LIDODERM) 5 % Place 1 patch onto the skin daily. Remove & Discard patch within 12 hours or as directed by MD  30 patch  0  . losartan (COZAAR) 50 MG tablet Take 25 mg by mouth every other day.       . Multiple Vitamins-Minerals (CENTRUM SILVER) tablet Take 1 tablet by mouth daily.        . pantoprazole (PROTONIX) 40 MG tablet Take 1 tablet by mouth daily.      . potassium chloride SA (K-DUR,KLOR-CON) 20 MEQ tablet Take 1 tablet by mouth at bedtime.       . pravastatin (PRAVACHOL) 40 MG tablet Take 80 mg by mouth at bedtime.       . Probiotic Product (ALIGN) 4 MG CAPS Take 4 mg by mouth daily.      Marland Kitchen  propranolol (INDERAL) 60 MG tablet Take 60 mg by mouth 2 (two) times daily.      . RESTASIS 0.05 % ophthalmic emulsion Place 1 drop into both eyes every 12 (twelve) hours.       Marland Kitchen warfarin (COUMADIN) 2.5 MG tablet Take 1.25-2.5 mg by mouth every evening. Takes 2.5 mg on Mon Wed and Friday and a half tablet all other days        Assessment: 45 yof presented to the ED as a Code Stroke. She is on chronic coumadin for afib. Her INR is therapeutic today at 2.45. No bleeding noted, CBC is WNL.   Goal of Therapy:  INR 2-3   Plan:  1. Warfarin 2.5mg  PO x 1 tonight (normal home dose for today) 2. Daily INR  Rayshell Goecke, Drake Leach 08/23/2012,10:31 PM

## 2012-08-23 NOTE — ED Notes (Signed)
Pt brought to ED by EMS with left side weakness,on and off headache.

## 2012-08-23 NOTE — Code Documentation (Signed)
Patient complained of dizziness throughout day. Around 1800 patient and husband noticed left sided weakness and decreased sensation in left leg. Patient's husband assisted her to the bathroom and EMS was called. Code stroke called at 1913, patient arrived via [facility] at 1919, LKW at 1800, but it is unclear due to the dizziness. EDP exam at 1919, stroke team arrived at 1923, neurologist arrived at 1920, patient arrived to CT at 1923, phlebotomist arrived at 1920, CT read by Dr. Amada Jupiter at [phone]. Initial NIH 03, patient on coumadin INR 2.45. Will continue to monitor

## 2012-08-23 NOTE — ED Provider Notes (Signed)
History    CSN: [id number] Arrival date & time 08/23/12  1919  First MD Initiated Contact with Patient 08/23/12 1930     Chief Complaint  Patient presents with  . Code Stroke   (Consider location/radiation/quality/duration/timing/severity/associated sxs/prior Treatment) The history is limited by the condition of the patient.    Patient presents with new left lower extremity weakness.  Onset seems to have been about 2 hours prior to ED arrival.  Since that time the patient has had inability to move the distal right lower extremity.  No other new weakness.  She also c/o facial droop, though this may be old. She states that prior to onset of symptoms she was dizzy, but had no other focal weakness. She does have history of prior stroke, though with now persistent right-sided deficits.  She denies current disorientation or confusion, and current dyspnea, chest pain, belly pain, nausea, vomiting, diarrhea. Patient presented as a code stroke.  Past Medical History  Diagnosis Date  . Sick sinus syndrome     with pacemaker and ablation  . Stroke 2010  . Glaucoma   . Seasonal allergic rhinitis     Pos Skin Test 07-05-07  . GERD (gastroesophageal reflux disease)   . Atrial fibrillation   . Hypertension   . Hyperlipidemia   . Cancer bREAST CA(12/2010)  . Chronic kidney disease TUMOR ON RIGHT KIDNEY-OBSERVING  . Arthritis KNEES &BACK  . Neuromuscular disorder SLIGHT TREMOR.  . Bleeding from breast     right   Past Surgical History  Procedure Laterality Date  . Cholecystectomy    . Vesicovaginal fistula closure w/ tah    . Atrial ablation surgery    . Flexible bronchoscopy w/ upper endoscopy      and swallowing evaluation  . Pacemaker insertion    . Knee arthroscopy    . Cataract extraction    . Insert / replace / remove pacemaker  01/2007    FOLLOWED BY [facility]  . Abdominal hysterectomy  1975    pt. unsure if laparascopic/vaginal/abdominal  . Breast surgery  01/2011     mastectomy   Family History  Problem Relation Age of Onset  . Asthma Brother   . Asthma Daughter   . Heart disease    . Cancer    . Heart failure Mother   . Cancer Father     brain  . Lymphoma Brother   . Diabetes Brother   . Hypertension Brother    History  Substance Use Topics  . Smoking status: Never Smoker   . Smokeless tobacco: Never Used  . Alcohol Use: No   OB History   Grav Para Term Preterm Abortions TAB SAB Ect Mult Living                 Review of Systems  All other systems reviewed and are negative.    Allergies  Review of patient's allergies indicates no known allergies.  Home Medications   Current Outpatient Rx  Name  Route  Sig  Dispense  Refill  . acetaminophen (TYLENOL) 500 MG tablet   Oral   Take 1,000 mg by mouth every 6 (six) hours as needed. For pain.         . benzonatate (TESSALON) 100 MG capsule   Oral   Take 100 mg by mouth 3 (three) times daily as needed for cough.         . Calcium Carbonate-Vitamin D (CALCIUM-VITAMIN D) 500-200 MG-UNIT per tablet  Oral   Take 1 tablet by mouth 2 (two) times daily with a meal.          . cephALEXin (KEFLEX) 250 MG capsule   Oral   Take 1 capsule by mouth at bedtime.          . colestipol (COLESTID) 1 G tablet   Oral   Take 1 g by mouth at bedtime.         . furosemide (LASIX) 40 MG tablet   Oral   Take 40 mg by mouth daily.          Marland Kitchen levETIRAcetam (KEPPRA) 500 MG tablet   Oral   Take 250 mg by mouth every 12 (twelve) hours.         . lidocaine (LIDODERM) 5 %   Transdermal   Place 1 patch onto the skin daily. Remove & Discard patch within 12 hours or as directed by MD   30 patch   0   . losartan (COZAAR) 50 MG tablet   Oral   Take 25 mg by mouth every other day.          . Multiple Vitamins-Minerals (CENTRUM SILVER) tablet   Oral   Take 1 tablet by mouth daily.           . pantoprazole (PROTONIX) 40 MG tablet   Oral   Take 1 tablet by mouth daily.          . potassium chloride SA (K-DUR,KLOR-CON) 20 MEQ tablet   Oral   Take 1 tablet by mouth at bedtime.          . pravastatin (PRAVACHOL) 40 MG tablet   Oral   Take 80 mg by mouth at bedtime.          . Probiotic Product (ALIGN) 4 MG CAPS   Oral   Take 4 mg by mouth daily.         . propranolol (INDERAL) 60 MG tablet   Oral   Take 60 mg by mouth 2 (two) times daily.         . RESTASIS 0.05 % ophthalmic emulsion   Both Eyes   Place 1 drop into both eyes every 12 (twelve) hours.          Marland Kitchen warfarin (COUMADIN) 2.5 MG tablet   Oral   Take 1.25-2.5 mg by mouth every evening. Takes 2.5 mg on Mon Wed and Friday and a half tablet all other days          BP 153/73  Pulse 67  Temp(Src) 98.7 F (37.1 C) (Oral)  Resp 20  Ht 5\' 3"  (1.6 m)  Wt 154 lb (69.854 kg)  BMI 27.29 kg/m2  SpO2 97% Physical Exam  Nursing note and vitals reviewed. Constitutional: She is oriented to person, place, and time. She appears well-developed and well-nourished. No distress.  HENT:  Head: Normocephalic and atraumatic.  Eyes: Conjunctivae and EOM are normal.  Cardiovascular: Normal rate and regular rhythm.   Pulmonary/Chest: Effort normal and breath sounds normal. No stridor. No respiratory distress.  Prior mastectomy, right  Abdominal: She exhibits no distension.  Musculoskeletal: She exhibits no edema.  Neurological: She is alert and oriented to person, place, and time. She displays atrophy and tremor. No cranial nerve deficit. She exhibits abnormal muscle tone. She displays no seizure activity. Coordination normal.  Patient has decreased strength in the distal left lower extremity, but does resist against motion.  She has preserved sensation in this extremity.  Patient has Parkinson's disease as well.  Skin: Skin is warm and dry.  Psychiatric: She has a normal mood and affect.    ED Course  Procedures (including critical care time) Labs Reviewed  PROTIME-INR - Abnormal; Notable for  the following:    Prothrombin Time 25.8 (*)    INR 2.45 (*)    All other components within normal limits  APTT - Abnormal; Notable for the following:    aPTT 38 (*)    All other components within normal limits  GLUCOSE, CAPILLARY - Abnormal; Notable for the following:    Glucose-Capillary 101 (*)    All other components within normal limits  CBC  DIFFERENTIAL  TROPONIN I  ETHANOL  COMPREHENSIVE METABOLIC PANEL  URINE RAPID DRUG SCREEN (HOSP PERFORMED)  URINALYSIS, ROUTINE W REFLEX MICROSCOPIC   Ct Head Wo Contrast  08/23/2012   *RADIOLOGY REPORT*  Clinical Data: Code stroke.  Left-sided facial droop and left-sided leg weakness.  CT HEAD WITHOUT CONTRAST  Technique:  Contiguous axial images were obtained from the base of the skull through the vertex without contrast.  Comparison: CT head without contrast 09/06/2011.  Findings: Atrophy and extensive white matter disease is similar to the prior exam.  The basal ganglia are unchanged.  The insular cortex is intact.  No acute cortical infarct, hemorrhage, or mass lesion is present.  The ventricles are proportionate to the degree of atrophy.  No significant extra-axial fluid collection is present.  The paranasal sinuses and mastoid air cells are clear.  The osseous skull is intact.  IMPRESSION:  1.  Stable atrophy and diffuse white matter disease. 2.  No acute intracranial abnormality.  These results were called by telephone on 08/23/2012 at 07:38 p.m. to Dr. Amada Jupiter, who verbally acknowledged these results.   Original Report Authenticated By: Marin Roberts, M.D.   No diagnosis found. Pulse ox 99% room air normal Cardiac 65 sinus rhythm normal  I conducted the initial evaluation with our colleagues from neurology.  EKG has sinus rhythm, rate 65, intraventricular conduction is a bit delayed, with nonspecific T wave changes.  This is largely unchanged from prior, and is abnormal. MDM  Patient presents with new left distal lower  extremity weakness and possible facial droop.  On exam facial droop is not discernible, but the patient does have diminished strength in the left lower extremity.  With her history of prior stroke, there is concern for this entity, though initial head CT is reassuring.  Patient is therapeutic with Coumadin, but requires admission for further evaluation and management.  Gerhard Munch, MD 08/23/12 2035

## 2012-08-23 NOTE — Consult Note (Addendum)
Neurology Consultation Reason for Consult: Concern for stroke Referring Physician: Jeraldine Loots, R  CC: Left leg weakness  History is obtained from: Patient, family  HPI: Debra Lynn is a 79 y.o. female with a history of atrial fibrillation and previous strokes who presents with sudden onset left leg weakness that started around 6 PM. She states that she often gets lightheaded when she leans over, but today it seems worse. She denies any true vertigo. Around 6 PM, she noticed that her left leg was numb, and felt weak and therefore called 911 out of concern for stroke. She denies involvement of her arm.   LKW: Unclear, the leg weakness started at 6 PM but the lightheadedness since this morning tpa given: no, INR 2.45    ROS: A 14 point ROS was performed and is negative except as noted in the HPI.  Past Medical History  Diagnosis Date  . Sick sinus syndrome     with pacemaker and ablation  . Stroke 2010  . Glaucoma   . Seasonal allergic rhinitis     Pos Skin Test 07-05-07  . GERD (gastroesophageal reflux disease)   . Atrial fibrillation   . Hypertension   . Hyperlipidemia   . Cancer bREAST CA(12/2010)  . Chronic kidney disease TUMOR ON RIGHT KIDNEY-OBSERVING  . Arthritis KNEES &BACK  . Neuromuscular disorder SLIGHT TREMOR.  . Bleeding from breast     right    Family History: Brother-TIA  Social History: Tob: Denies  Exam: Current vital signs: BP 153/73  Pulse 67  Temp(Src) 98.7 F (37.1 C) (Oral)  Resp 20  Ht 5\' 3"  (1.6 m)  Wt 69.854 kg (154 lb)  BMI 27.29 kg/m2  SpO2 97% Vital signs in last 24 hours: Temp:  [98.7 F (37.1 C)] 98.7 F (37.1 C) (07/23 1935) Pulse Rate:  [63-67] 67 (07/23 2000) Resp:  [16-20] 20 (07/23 2000) BP: (144-153)/(73-96) 153/73 mmHg (07/23 2000) SpO2:  [97 %-98 %] 97 % (07/23 2000) Weight:  [69.854 kg (154 lb)] 69.854 kg (154 lb) (07/23 1935)  General: In bed, NAD CV: Regular rate and rhythm Mental Status: Patient is awake, alert,  oriented to person, place, month, year, and situation. Immediate and remote memory are intact. Patient is able to give a clear and coherent history. No signs of aphasia or neglect Cranial Nerves: II: Visual Fields are full. Pupils are equal, round, and reactive to light.  Discs are difficult to visualize. III,IV, VI: EOMI without ptosis or diploplia.  V: Facial sensation is symmetric to temperature VII: Facial movement is notable for mild left facial droop VIII: hearing is intact to voice X: Uvula elevates symmetrically XI: Shoulder shrug is symmetric. XII: tongue is midline without atrophy or fasciculations.  Motor: Tone is increased mildly in the left arm. Bulk is normal. 5/5 strength was present in right side, on the left her arm seems strong, and there is no drift but she has mild 4+/5 weakness in the leg, with drift. Sensory: Sensation is intact in the right, diminished in the left leg to pin Deep Tendon Reflexes: 2+ and symmetric in the biceps and patellae.  Cerebellar: FNF and HKS are intact bilaterally Gait: Not assessed due to acute nature of evaluation and multiple medical monitors in ED setting.   I have reviewed labs in epic and the results pertinent to this consultation are: INR 2.45  I have reviewed the images obtained: CT head-negative acute  Impression: 79 year old female with sudden onset facial droop, left leg weakness and  numbness in the setting of atrial fibrillation. Her INR is currently therapeutic. She was not a TPA candidate due to her therapeutic INR, but will need to be admitted for physical therapy as well as evaluation of other risk factors for stroke.  She describes orthostasis, and has been titrating down on blood pressure medicines as an outpatient. Her tentative diagnosis of Parkinson's disease could be related to this. Also she has a history of a possible seizure in the past, and is at risk given her history of stroke and therefore is on  Keppra.  Recommendations: 1. HgbA1c, fasting lipid panel 2. Frequent neuro checks 3. Echocardiogram 4. Carotid dopplers 5. Prophylactic therapy-Anticoagulation: Coumadin- home dose.  6. Risk factor modification 7. Telemetry monitoring 8. PT consult, OT consult, Speech consult 9. keppra 250 mg twice a day as at home   Ritta Slot, MD [facility] [phone]  If 7pm- 7am, please page neurology on call at [phone].

## 2012-08-24 ENCOUNTER — Encounter (HOSPITAL_COMMUNITY): Payer: Self-pay | Admitting: *Deleted

## 2012-08-24 DIAGNOSIS — E785 Hyperlipidemia, unspecified: Secondary | ICD-10-CM

## 2012-08-24 DIAGNOSIS — Z950 Presence of cardiac pacemaker: Secondary | ICD-10-CM

## 2012-08-24 LAB — GLUCOSE, CAPILLARY
Glucose-Capillary: 99 mg/dL (ref 70–99)
Glucose-Capillary: 103 mg/dL — ABNORMAL HIGH (ref 70–99)
Glucose-Capillary: 110 mg/dL — ABNORMAL HIGH (ref 70–99)

## 2012-08-24 LAB — URINE CULTURE: Colony Count: 80000

## 2012-08-24 LAB — LIPID PANEL
Cholesterol: 190 mg/dL (ref 0–200)
HDL: 62 mg/dL (ref >39)
LDL Cholesterol: 82 mg/dL (ref 0–99)
Total CHOL/HDL Ratio: 3.1 RATIO
Triglycerides: 229 mg/dL — ABNORMAL HIGH (ref <150)
VLDL: 46 mg/dL — ABNORMAL HIGH (ref 0–40)

## 2012-08-24 LAB — PROTIME-INR
INR: 2.45 — ABNORMAL HIGH (ref 0.00–1.49)
Prothrombin Time: 25.8 seconds — ABNORMAL HIGH (ref 11.6–15.2)

## 2012-08-24 LAB — HEMOGLOBIN A1C
Hgb A1c MFr Bld: 5.5 % (ref <5.7)
Mean Plasma Glucose: 111 mg/dL (ref <117)

## 2012-08-24 MED ORDER — WARFARIN SODIUM 2.5 MG PO TABS
2.5000 mg | ORAL_TABLET | ORAL | Status: DC
  Filled 2012-08-24: qty 1

## 2012-08-24 MED ORDER — WARFARIN 1.25 MG HALF TABLET
1.2500 mg | ORAL_TABLET | ORAL | Status: DC
  Administered 2012-08-24: 1.25 mg via ORAL
  Filled 2012-08-24: qty 1

## 2012-08-24 NOTE — Progress Notes (Signed)
   CARE MANAGEMENT NOTE 08/24/2012  Patient:  Debra Lynn, Debra Lynn   Account Number:  [id number]  Date Initiated:  08/24/2012  Documentation initiated by:  Jiles Crocker  Subjective/Objective Assessment:   ADMITTED FOR STROKE     Action/Plan:   PCP:   Daisy Floro, MD  LIVES WITH SPOUSE; CM FOLLOWING FOR DCP; AWAITING FOR PT/OT EVALS   Anticipated DC Date:  08/31/2012   Anticipated DC Plan:  POSSIBLY HOME W HOME HEALTH SERVICES DEPENDING ON PT/OT EVALUATION      DC Planning Services  CM consult               Status of service:  In process, will continue to follow Medicare Important Message given?  NA - LOS <3 / Initial given by admissions (If response is "NO", the following Medicare IM given date fields will be blank)  Per UR Regulation:  Reviewed for med. necessity/level of care/duration of stay  Comments:  08/24/2012- B Crystian Frith RN,BSN,MHA

## 2012-08-24 NOTE — Progress Notes (Signed)
[facility] PROGRESS NOTE  Debra Lynn ZOX:[id number] DOB: May 05, 1928 DOA: 08/23/2012 PCP: Daisy Floro, MD  Assessment/Plan: TIA vs CVA- work up in progress; has PPM so no MRI- neurology consult, on coumadin, [facility]- [facility] here done; vascular: Bilateral: Less than 39% ICA stenosis. Vertebral artery flow is antegrade PT/OT   Code Status: full Family Communication: patient at bedside Disposition Plan: home soon?   Consultants:  neuro  Procedures:    Antibiotics:   HPI/Subjective: Feeling better No c/o  Objective: Filed Vitals:   08/24/12 0000 08/24/12 0200 08/24/12 0400 08/24/12 0942  BP: 108/85 128/63 124/68 106/66  Pulse: 66 54 68 64  Temp: 97.6 F (36.4 C) 97.8 F (36.6 C) 97.8 F (36.6 C) 97.4 F (36.3 C)  TempSrc: Oral Oral Oral Oral  Resp: 20 20 20 20   Height:      Weight:      SpO2: 97% 98% 98% 98%    Intake/Output Summary (Last 24 hours) at 08/24/12 1320 Last data filed at 08/24/12 0837  Gross per 24 hour  Intake      0 ml  Output      4 ml  Net     -4 ml   Filed Weights   08/23/12 1935 08/23/12 2200  Weight: 69.854 kg (154 lb) 70.1 kg (154 lb 8.7 oz)    Exam:   General:  A+Ox3, NAd- tremor  Cardiovascular: rrr  Respiratory: clear  Abdomen: +Bs, soft, NT  Musculoskeletal: moves all 4 ext   Data Reviewed: Basic Metabolic Panel:  Recent Labs Lab 08/23/12 1928 08/23/12 1943  NA 138 142  K 4.1 4.0  CL 100 102  CO2 27  --   GLUCOSE 97 98  BUN 16 18  CREATININE 0.99 1.10  CALCIUM 9.8  --    Liver Function Tests:  Recent Labs Lab 08/23/12 1928  AST 23  ALT 13  ALKPHOS 41  BILITOT 0.3  PROT 7.5  ALBUMIN 3.9   No results found for this basename: LIPASE, AMYLASE,  in the last 168 hours No results found for this basename: AMMONIA,  in the last 168 hours CBC:  Recent Labs Lab 08/23/12 1928 08/23/12 1943  WBC 6.9  --   NEUTROABS 3.2  --   HGB 13.0 13.3  HCT 37.2 39.0  MCV 86.9  --    PLT 153  --    Cardiac Enzymes:  Recent Labs Lab 08/23/12 1928  TROPONINI <0.30   BNP (last 3 results) No results found for this basename: PROBNP,  in the last 8760 hours CBG:  Recent Labs Lab 08/23/12 1946 08/24/12 0705 08/24/12 1148  GLUCAP 101* 99 103*    No results found for this or any previous visit (from the past 240 hour(s)).   Studies: Ct Head Wo Contrast  08/23/2012   *RADIOLOGY REPORT*  Clinical Data: Code stroke.  Left-sided facial droop and left-sided leg weakness.  CT HEAD WITHOUT CONTRAST  Technique:  Contiguous axial images were obtained from the base of the skull through the vertex without contrast.  Comparison: CT head without contrast 09/06/2011.  Findings: Atrophy and extensive white matter disease is similar to the prior exam.  The basal ganglia are unchanged.  The insular cortex is intact.  No acute cortical infarct, hemorrhage, or mass lesion is present.  The ventricles are proportionate to the degree of atrophy.  No significant extra-axial fluid collection is present.  The paranasal sinuses and mastoid air cells are clear.  The  osseous skull is intact.  IMPRESSION:  1.  Stable atrophy and diffuse white matter disease. 2.  No acute intracranial abnormality.  These results were called by telephone on 08/23/2012 at 07:38 p.m. to Dr. Amada Jupiter, who verbally acknowledged these results.   Original Report Authenticated By: Marin Roberts, M.D.    Scheduled Meds: . acidophilus  1 capsule Oral Daily  . calcium-vitamin D  1 tablet Oral BID WC  . cephALEXin  250 mg Oral QHS  . colestipol  1 g Oral QHS  . cycloSPORINE  1 drop Both Eyes Q12H  . furosemide  40 mg Oral Daily  . levETIRAcetam  250 mg Oral Q12H  . losartan  25 mg Oral QODAY  . pantoprazole  40 mg Oral Daily  . potassium chloride SA  20 mEq Oral QHS  . propranolol  60 mg Oral BID  . simvastatin  20 mg Oral q1800  . warfarin  1.25 mg Oral Custom  . warfarin  2.5 mg Oral Once  . [START ON  08/25/2012] warfarin  2.5 mg Oral Custom  . Warfarin - Pharmacist Dosing Inpatient   Does not apply q1800   Continuous Infusions:   Principal Problem:   CVA Active Problems:   G E R D   Acute ischemic stroke   Sick sinus syndrome   Pacemaker   HTN (hypertension)   Hyperlipidemia   A-fib    Time spent: 35    [facility], Ishana Blades  [facility] Pager [phone]-. If 7PM-7AM, please contact night-coverage at [email or web address], password [facility] 08/24/2012, 1:20 PM  LOS: 1 day

## 2012-08-24 NOTE — Progress Notes (Signed)
VASCULAR LAB PRELIMINARY  PRELIMINARY  PRELIMINARY  PRELIMINARY  Carotid duplex completed.    Preliminary report:  Bilateral:  Less than 39% ICA stenosis.  Vertebral artery flow is antegrade.      Angelito Hopping, RVT 08/24/2012, 10:44 AM

## 2012-08-24 NOTE — Progress Notes (Signed)
  Echocardiogram 2D Echocardiogram has been performed.  Debra Lynn 08/24/2012, 11:29 AM

## 2012-08-24 NOTE — Evaluation (Signed)
Occupational Therapy Evaluation Patient Details Name: Debra Lynn MRN: [id number] DOB: [birth year] Today's Date: 08/24/2012 Time: 4098-1191 OT Time Calculation (min): 34 min  OT Assessment / Plan / Recommendation History of present illness Debra Lynn is an 79 y.o. female with hx of prior stroke, with residual dysarthria, newly diagnosed parkinsons (not full diagnosis but tremor, gait symptoms), afib s/p ablation with subsequent PPM on therapeutic coumadin, INR 2.45, presents to the ER with sudden onset of left lower leg weakness and left facial droop.   Clinical Impression   PT admitted with left sided weakness, and facial droop. Pt currently with functional limitiations due to the deficits listed below (see OT problem list).  Pt will benefit from skilled OT to increase their independence and safety with adls and balance to allow discharge home where balance deficits are being addressed by the Parkinson's balance program at [facility].     OT Assessment  Patient needs continued OT Services    Follow Up Recommendations  No OT follow up       Equipment Recommendations  None recommended by OT       Frequency  Min 2X/week    Precautions / Restrictions Precautions Precautions: Fall Restrictions Weight Bearing Restrictions: No   Pertinent Vitals/Pain Pt reported no pain during session    ADL  Eating/Feeding: Modified independent Where Assessed - Eating/Feeding: Chair Grooming: Wash/dry hands;Wash/dry face;Teeth care;Min guard Where Assessed - Grooming: Supported standing Upper Body Bathing: Right arm;Left arm;Chest;Min guard Where Assessed - Upper Body Bathing: Supported standing Lower Body Bathing: Minimal assistance Where Assessed - Lower Body Bathing: Supported sitting Upper Body Dressing: Min guard Where Assessed - Upper Body Dressing: Supported sitting Lower Body Dressing: Minimal assistance Where Assessed - Lower Body Dressing: Supported sitting Toilet Transfer:  Minimal assistance Statistician Method: Sit to Barista: Comfort height toilet;Grab bars Toileting - Architect and Hygiene: Modified independent Where Assessed - Engineer, mining and Hygiene: Sit to stand from 3-in-1 or toilet Tub/Shower Transfer: Min guard Tub/Shower Transfer Method: Science writer: Walk in shower Equipment Used: Gait belt Transfers/Ambulation Related to ADLs: Pt min guard for transfers and ambulation. Pt has been going to OPPT at [facility] for balance program specifically for Parkinson's ADL Comments: Pt able to remember all tasks in bathroom, perform BUE grooming tasks at sink and toileting/pericare/LB dressing. Pt reports feeling weaker, and fatigues faster than before but otherwise per Pt she feels like she is back to where she was.    OT Diagnosis: Generalized weakness  OT Problem List: Decreased strength;Decreased activity tolerance;Impaired balance (sitting and/or standing) OT Treatment Interventions: Therapeutic exercise;Therapeutic activities;Balance training   OT Goals(Current goals can be found in the care plan section) Acute Rehab OT Goals Patient Stated Goal: stronger, independent OT Goal Formulation: With patient/family Time For Goal Achievement: 09/07/12 Potential to Achieve Goals: Good ADL Goals Pt Will Perform Grooming: with modified independence;standing Pt Will Perform Lower Body Dressing: with set-up;sit to/from stand Pt Will Transfer to Toilet: with modified independence;regular height toilet  Visit Information  Last OT Received On: 08/24/12 Assistance Needed: +1 History of Present Illness: FLOYCE Lynn is an 79 y.o. female with hx of prior stroke, with residual dysarthria, newly diagnosed parkinsons (not full diagnosis but tremor, gait symptoms), afib s/p ablation with subsequent PPM on therapeutic coumadin, INR 2.45, presents to the ER with sudden onset of left  lower leg weakness and left facial droop.       Prior Functioning  Home Living Family/patient expects to be discharged to:: Private residence Living Arrangements: Spouse/significant other Available Help at Discharge: Family;Available 24 hours/day Type of Home: House Home Access: Stairs to enter [employer] of Steps: 1-2 (4 inch steps) Home Layout: One level Home Equipment: Walker - 2 wheels;Cane - single point Additional Comments: was not using any AD except cane occasionally, does "PT" exercises daily with her husband for her Parkinsons (had recently done a 12 wk program at [facility] and [facility] for [facility]  Prior Function Level of Independence: Independent Comments: does not drive Communication Communication: No difficulties Dominant Hand: Right         Vision/Perception Vision - History Baseline Vision: Wears glasses all the time Patient Visual Report: No change from baseline Vision - Assessment Eye Alignment: Within Functional Limits Vision Assessment: Vision not tested   Cognition  Cognition Arousal/Alertness: Awake/alert Behavior During Therapy: WFL for tasks assessed/performed Overall Cognitive Status: History of cognitive impairments - at baseline Area of Impairment: Memory;Problem solving Problem Solving: Slow processing General Comments: back at baseline per daughter and Pt    Extremity/Trunk Assessment Upper Extremity Assessment Upper Extremity Assessment: Overall WFL for tasks assessed;Generalized weakness Lower Extremity Assessment Lower Extremity Assessment: Defer to PT evaluation     Mobility Bed Mobility Bed Mobility: Supine to Sit Supine to Sit: 5: Supervision Details for Bed Mobility Assistance: supervision for safety, increased effort to rise from bed, cues to scoot fully to the edge of the bed Transfers Transfers: Sit to Stand;Stand to Sit Sit to Stand: 4: Min assist;With upper extremity assist;From bed Stand to Sit: 4: Min  assist;With upper extremity assist;To chair/3-in-1 Details for Transfer Assistance: min stability assist        Balance Balance Balance Assessed: Yes Static Standing Balance Static Standing - Balance Support: No upper extremity supported Static Standing - Level of Assistance: 5: Stand by assistance   End of Session OT - End of Session Equipment Utilized During Treatment: Gait belt Activity Tolerance: Patient tolerated treatment well Patient left: in bed;with call bell/phone within reach;with bed alarm set;with family/visitor present Nurse Communication: Mobility status;Precautions  GO     Sherryl Manges 08/24/2012, 4:04 PM

## 2012-08-24 NOTE — Progress Notes (Signed)
Stroke Team Progress Note  HISTORY Debra Lynn is a 79 y.o. female with a history of atrial fibrillation and previous strokes who presents with sudden onset left leg weakness that started around 6 PM on 08/23/2012. She states that she often gets lightheaded when she leans over, but today it seems worse - she is unclear the time her lightheadedness started/got worse. She denies any true vertigo. Around 6 PM, she noticed that her left leg was numb, and felt weak and therefore called 911 out of concern for stroke. She denies involvement of her arm. Patient was not a TPA candidate secondary to unclear time of onset. She was admitted for further evaluation and treatment.  Patient has PACEMAKER.  SUBJECTIVE Patient lying in room. She states that she is back at baseline, able to move her left side. Has had previous stroke but at baseline. Family in room.   OBJECTIVE Most recent Vital Signs: Filed Vitals:   08/24/12 0200 08/24/12 0400 08/24/12 0942 08/24/12 1337  BP: 128/63 124/68 106/66 101/54  Pulse: 54 68 64 67  Temp: 97.8 F (36.6 C) 97.8 F (36.6 C) 97.4 F (36.3 C) 97.7 F (36.5 C)  TempSrc: Oral Oral Oral Oral  Resp: 20 20 20 20   Height:      Weight:      SpO2: 98% 98% 98% 96%   CBG (last 3)   Recent Labs  08/23/12 1946 08/24/12 0705 08/24/12 1148  GLUCAP 101* 99 103*    IV Fluid Intake:     MEDICATIONS  . acidophilus  1 capsule Oral Daily  . calcium-vitamin D  1 tablet Oral BID WC  . cephALEXin  250 mg Oral QHS  . colestipol  1 g Oral QHS  . cycloSPORINE  1 drop Both Eyes Q12H  . furosemide  40 mg Oral Daily  . levETIRAcetam  250 mg Oral Q12H  . losartan  25 mg Oral QODAY  . pantoprazole  40 mg Oral Daily  . potassium chloride SA  20 mEq Oral QHS  . propranolol  60 mg Oral BID  . simvastatin  20 mg Oral q1800  . warfarin  1.25 mg Oral Custom  . warfarin  2.5 mg Oral Once  . [START ON 08/25/2012] warfarin  2.5 mg Oral Custom  . Warfarin - Pharmacist Dosing  Inpatient   Does not apply q1800   PRN:  acetaminophen  Diet:  Cardiac thin liquids Activity:  Bathroom privileges with assistance DVT Prophylaxis:  Warfarin, INF 2.45  CLINICALLY SIGNIFICANT STUDIES Basic Metabolic Panel:   Recent Labs Lab 08/23/12 1928 08/23/12 1943  NA 138 142  K 4.1 4.0  CL 100 102  CO2 27  --   GLUCOSE 97 98  BUN 16 18  CREATININE 0.99 1.10  CALCIUM 9.8  --    Liver Function Tests:   Recent Labs Lab 08/23/12 1928  AST 23  ALT 13  ALKPHOS 41  BILITOT 0.3  PROT 7.5  ALBUMIN 3.9   CBC:   Recent Labs Lab 08/23/12 1928 08/23/12 1943  WBC 6.9  --   NEUTROABS 3.2  --   HGB 13.0 13.3  HCT 37.2 39.0  MCV 86.9  --   PLT 153  --    Coagulation:   Recent Labs Lab 08/23/12 1928 08/24/12 0630  LABPROT 25.8* 25.8*  INR 2.45* 2.45*   Cardiac Enzymes:   Recent Labs Lab 08/23/12 1928  TROPONINI <0.30   Urinalysis:   Recent Labs Lab 08/23/12 2052  COLORURINE  YELLOW  LABSPEC 1.010  PHURINE 7.0  GLUCOSEU NEGATIVE  HGBUR NEGATIVE  BILIRUBINUR NEGATIVE  KETONESUR NEGATIVE  PROTEINUR 30*  UROBILINOGEN 0.2  NITRITE NEGATIVE  LEUKOCYTESUR MODERATE*   Lipid Panel    Component Value Date/Time   CHOL 190 08/24/2012 0630   TRIG 229* 08/24/2012 0630   HDL 62 08/24/2012 0630   CHOLHDL 3.1 08/24/2012 0630   VLDL 46* 08/24/2012 0630   LDLCALC 82 08/24/2012 0630   HgbA1C  Lab Results  Component Value Date   HGBA1C 5.5 08/24/2012    Urine Drug Screen:     Component Value Date/Time   LABOPIA NONE DETECTED 08/23/2012 2052   COCAINSCRNUR NONE DETECTED 08/23/2012 2052   LABBENZ NONE DETECTED 08/23/2012 2052   AMPHETMU NONE DETECTED 08/23/2012 2052   THCU NONE DETECTED 08/23/2012 2052   LABBARB NONE DETECTED 08/23/2012 2052    Alcohol Level:   Recent Labs Lab 08/23/12 1928  ETH <11   CT of the brain  08/23/2012  1.  Stable atrophy and diffuse white matter disease. 2.  No acute intracranial abnormality.   MRI of the brain  PACEMAKER   MRA of the brain    2D Echocardiogram  EF 55%. No cardiac source of emboli was indentified  Carotid Doppler  No evidence of hemodynamically significant internal carotid artery stenosis. Vertebral artery flow is antegrade.   CXR      EKG     Therapy Recommendations out patient PT, awaiting OT   Physical Exam   Patient is awake, alert, oriented to person, place, month, year, and situation.  Immediate and remote memory are intact.  Patient is able to give a clear and coherent history.  No signs of aphasia or neglect  Cranial Nerves:  II: Visual Fields are full. Pupils are equal, round, and reactive to light. III,IV, VI: EOMI without ptosis or diploplia.  V: Facial sensation is symmetric to temperature  VII: Facial movement is notable for mild left facial droop  VIII: hearing is intact to voice  X: Uvula elevates symmetrically  XI: Shoulder shrug is symmetric.  XII: tongue is midline without atrophy or fasciculations.  Motor:  5/5 UE/LE bilaterally, at baseline per patient.  Sensory:  Intact bilaterally   ASSESSMENT Ms. Debra Lynn is a 79 y.o. female presenting with left hemiparesis, light headedness . Imaging confirms diffuse white matter disease.  On warfarin prior to admission. Now on warfarin for secondary stroke prevention. Symptoms felt secondary to embolic source, atrial fibrillation. Patient with resultant left hemiparesis worsened from her baseline gait abnormality from her previous stroke. Work up underway.   Sick sinus symdrome, s/p PPM  Stroke  Atrial fibrillation  Long term medication use, coumadin, admit INR 2.45  Hypertension  Hyperlipidemia, LDL 82  Breast cancer 2012  Neuromuscular disorder~Parkinsons  HGB A1C  5.5   Urinary tract infection  Hospital day # 1  TREATMENT/PLAN  Continue warfarin for secondary stroke prevention.  Risk factor modification  Outpatient PT recommended, await all therapy evaluations.  On antibiotics for  UTI.  Cannot have MRI due to PPM   Ilias Stcharles D. Manson Passey, [facility], MBA, MHA [facility] Pager: [phone] 08/24/2012 2:11 PM  I have personally obtained a history, examined the patient, evaluated imaging results, and formulated the assessment and plan of care. I agree with the above.

## 2012-08-24 NOTE — Evaluation (Signed)
Physical Therapy Evaluation Patient Details Name: Debra Lynn MRN: [id number] DOB: [birth year] Today's Date: 08/24/2012 Time: 8657-8469 PT Time Calculation (min): 37 min  PT Assessment / Plan / Recommendation History of Present Illness  Debra Lynn is an 79 y.o. female with hx of prior stroke, with residual dysarthria, newly diagnosed parkinsons (not full diagnosis but tremor, gait symptoms), afib s/p ablation with subsequent PPM on therapeutic coumadin, INR 2.45, presents to the ER with sudden onset of left lower leg weakness and left facial droop.Unable to perform MRI due to PPM, suspect acute CVA.   Clinical Impression  Presents to PT with baseline gait abnormality and instability further impaired by weakness impacting her independence and safety with mobility. Will benefit physical therapy in the acute setting to maximize functional independence and stability with appropriate AD so that she can d/c home safely with the support of family. Family wants to discuss OPPT options. She has been to [facility] and might be happier there.      PT Assessment  Patient needs continued PT services    Follow Up Recommendations  Outpatient PT;Supervision/Assistance - 24 hour    Does the patient have the potential to tolerate intense rehabilitation      Barriers to Discharge        Equipment Recommendations  None recommended by PT    Recommendations for Other Services     Frequency Min 4X/week    Precautions / Restrictions Precautions Precautions: Fall Restrictions Weight Bearing Restrictions: No   Pertinent Vitals/Pain Denies pain      Mobility  Bed Mobility Bed Mobility: Supine to Sit Supine to Sit: 5: Supervision Details for Bed Mobility Assistance: supervision for safety, increased effort to rise from bed, cues to scoot fully to the edge of the bed Transfers Transfers: Sit to Stand;Stand to Sit Sit to Stand: 4: Min assist;With upper extremity assist;From bed Stand to Sit: 4:  Min assist;With upper extremity assist;To chair/3-in-1 Details for Transfer Assistance: min stabilty assist, pt c/o dizziness/lightheadedness standing Ambulation/Gait Ambulation/Gait Assistance: 4: Min assist Assistive device: None Gait Pattern: Decreased stride length Gait velocity: decreased General Gait Details: decreased step height and length, downward gaze, cautious and gaurded stance, decreased trunk rotation         PT Diagnosis: Abnormality of gait;Generalized weakness;Difficulty walking  PT Problem List: Decreased strength;Decreased activity tolerance;Decreased balance;Decreased coordination;Decreased knowledge of use of DME PT Treatment Interventions: DME instruction;Gait training;Functional mobility training;Stair training;Therapeutic exercise;Therapeutic activities;Balance training;Neuromuscular re-education;Patient/family education     PT Goals(Current goals can be found in the care plan section) Acute Rehab PT Goals Patient Stated Goal: stronger, independent PT Goal Formulation: With patient Time For Goal Achievement: 08/31/12 Potential to Achieve Goals: Good  Visit Information  Last PT Received On: 08/24/12 Assistance Needed: +1 History of Present Illness: Debra Lynn is an 79 y.o. female with hx of prior stroke, with residual dysarthria, newly diagnosed parkinsons (not full diagnosis but tremor, gait symptoms), afib s/p ablation with subsequent PPM on therapeutic coumadin, INR 2.45, presents to the ER with sudden onset of left lower leg weakness and left facial droop.       Prior Functioning  Home Living Family/patient expects to be discharged to:: Private residence Living Arrangements: Spouse/significant other Available Help at Discharge: Family;Available 24 hours/day Type of Home: House Home Access: Stairs to enter [employer] of Steps: 1-2 (4 inch steps) Home Layout: One level Home Equipment: Walker - 2 wheels;Cane - single point Additional  Comments: was not using any AD except  cane occasionally, does "PT" exercises daily with her husband for her Parkinsons (had recently done a 12 wk program at [facility] and [facility] for [facility]  Prior Function Level of Independence: Independent Comments: does not drive Communication Communication: No difficulties    Cognition  Cognition Arousal/Alertness: Awake/alert Behavior During Therapy: WFL for tasks assessed/performed Overall Cognitive Status: History of cognitive impairments - at baseline Area of Impairment: Memory;Problem solving Problem Solving: Slow processing    Extremity/Trunk Assessment Upper Extremity Assessment Upper Extremity Assessment: Defer to OT evaluation Lower Extremity Assessment Lower Extremity Assessment: LLE deficits/detail;RLE deficits/detail RLE Deficits / Details: Knee extension 5/5, knee flexion 4+/5, hip flexion 4+/5, DF 4+/5, hip ADD/ABD 4+/5 LLE Deficits / Details: grossly 4-4+/5 (residual weakness from previous stroke)   Balance Balance Balance Assessed: Yes Static Standing Balance Static Standing - Balance Support: No upper extremity supported Static Standing - Level of Assistance: 5: Stand by assistance  End of Session PT - End of Session Equipment Utilized During Treatment: Gait belt Activity Tolerance: Patient tolerated treatment well Patient left: Other (comment) (in transport chair with transporter ) Nurse Communication: Mobility status  GP     [facility] HELEN 08/24/2012, 1:44 PM

## 2012-08-24 NOTE — Evaluation (Signed)
I agree with the following treatment note after reviewing documentation.   Johnston, Nya Monds Brynn   OTR/L Pager: [phone] Office: [phone] .   

## 2012-08-24 NOTE — H&P (Signed)
[facility] History and Physical  Debra Lynn ZOX:[id number] DOB: Jun 21, 1928    PCP:   Daisy Floro, MD   Chief Complaint: left leg weakness and facial droop presented as code stroke.  HPI: Debra Lynn is an 79 y.o. female with hx of prior stroke, with residual dysarthria, afib s/p ablation with subsequent PPM on therapeutic coumadin, INR 2.45, presents to the ER with sudden onset of left lower leg weakness and left facial droop.  She came as a code stroke, but was not a TPA candidate because she is on coumadin.  Neurology saw her and recommended that she be admitted for finishing her CVA work up.  Her CT of the head was negative.  Clinically, she presented as an acute CVA, likely embolic given hx of afib.  Rewiew of Systems:  Constitutional: Negative for malaise, fever and chills. No significant weight loss or weight gain Eyes: Negative for eye pain, redness and discharge, diplopia, visual changes, or flashes of light. ENMT: Negative for ear pain, hoarseness, nasal congestion, sinus pressure and sore throat. No headaches; tinnitus, drooling, or problem swallowing. Cardiovascular: Negative for chest pain, palpitations, diaphoresis, dyspnea and peripheral edema. ; No orthopnea, PND Respiratory: Negative for cough, hemoptysis, wheezing and stridor. No pleuritic chestpain. Gastrointestinal: Negative for nausea, vomiting, diarrhea, constipation, abdominal pain, melena, blood in stool, hematemesis, jaundice and rectal bleeding.    Genitourinary: Negative for frequency, dysuria, incontinence,flank pain and hematuria; Musculoskeletal: Negative for back pain and neck pain. Negative for swelling and trauma.;  Skin: . Negative for pruritus, rash, abrasions, bruising and skin lesion.; ulcerations Neuro: Negative for headache, lightheadedness and neck stiffness. Negative for weakness, altered level of consciousness , altered mental status, burning feet, involuntary movement, seizure and  syncope.  Psych: negative for anxiety, depression, insomnia, tearfulness, panic attacks, hallucinations, paranoia, suicidal or homicidal ideation    Past Medical History  Diagnosis Date  . Sick sinus syndrome     with pacemaker and ablation  . Stroke 2010  . Glaucoma   . Seasonal allergic rhinitis     Pos Skin Test 07-05-07  . GERD (gastroesophageal reflux disease)   . Atrial fibrillation   . Hypertension   . Hyperlipidemia   . Cancer bREAST CA(12/2010)  . Chronic kidney disease TUMOR ON RIGHT KIDNEY-OBSERVING  . Arthritis KNEES &BACK  . Neuromuscular disorder SLIGHT TREMOR.  . Bleeding from breast     right    Past Surgical History  Procedure Laterality Date  . Cholecystectomy    . Vesicovaginal fistula closure w/ tah    . Atrial ablation surgery    . Flexible bronchoscopy w/ upper endoscopy      and swallowing evaluation  . Pacemaker insertion    . Knee arthroscopy    . Cataract extraction    . Insert / replace / remove pacemaker  01/2007    FOLLOWED BY [facility]  . Abdominal hysterectomy  1975    pt. unsure if laparascopic/vaginal/abdominal  . Breast surgery  01/2011    mastectomy    Medications:  HOME MEDS: Prior to Admission medications   Medication Sig Start Date End Date Taking? Authorizing Provider  acetaminophen (TYLENOL) 500 MG tablet Take 1,000 mg by mouth every 6 (six) hours as needed. For pain.   Yes Historical Provider, MD  benzonatate (TESSALON) 100 MG capsule Take 100 mg by mouth 3 (three) times daily as needed for cough.   Yes Historical Provider, MD  Calcium Carbonate-Vitamin D (CALCIUM-VITAMIN D) 500-200 MG-UNIT per tablet  Take 1 tablet by mouth 2 (two) times daily with a meal.    Yes Historical Provider, MD  cephALEXin (KEFLEX) 250 MG capsule Take 1 capsule by mouth at bedtime.  05/25/11  Yes Historical Provider, MD  colestipol (COLESTID) 1 G tablet Take 1 g by mouth at bedtime.   Yes Historical Provider, MD  furosemide (LASIX) 40 MG tablet  Take 40 mg by mouth daily.  06/19/12  Yes Historical Provider, MD  levETIRAcetam (KEPPRA) 500 MG tablet Take 250 mg by mouth every 12 (twelve) hours.   Yes Historical Provider, MD  lidocaine (LIDODERM) 5 % Place 1 patch onto the skin daily. Remove & Discard patch within 12 hours or as directed by MD 03/30/12  Yes Lowella Dell, MD  losartan (COZAAR) 50 MG tablet Take 25 mg by mouth every other day.  05/26/12  Yes Historical Provider, MD  Multiple Vitamins-Minerals (CENTRUM SILVER) tablet Take 1 tablet by mouth daily.     Yes Historical Provider, MD  pantoprazole (PROTONIX) 40 MG tablet Take 1 tablet by mouth daily. 10/02/10  Yes Historical Provider, MD  potassium chloride SA (K-DUR,KLOR-CON) 20 MEQ tablet Take 1 tablet by mouth at bedtime.  08/31/10  Yes Historical Provider, MD  pravastatin (PRAVACHOL) 40 MG tablet Take 80 mg by mouth at bedtime.  08/31/10  Yes Historical Provider, MD  Probiotic Product (ALIGN) 4 MG CAPS Take 4 mg by mouth daily.   Yes Historical Provider, MD  propranolol (INDERAL) 60 MG tablet Take 60 mg by mouth 2 (two) times daily.   Yes Historical Provider, MD  RESTASIS 0.05 % ophthalmic emulsion Place 1 drop into both eyes every 12 (twelve) hours.  10/15/10  Yes Historical Provider, MD  warfarin (COUMADIN) 2.5 MG tablet Take 1.25-2.5 mg by mouth every evening. Takes 2.5 mg on Mon Wed and Friday and a half tablet all other days 10/14/10  Yes Historical Provider, MD     Allergies:  No Known Allergies  Social History:   reports that she has never smoked. She has never used smokeless tobacco. She reports that she does not drink alcohol or use illicit drugs.  Family History: Family History  Problem Relation Age of Onset  . Asthma Brother   . Asthma Daughter   . Heart disease    . Cancer    . Heart failure Mother   . Cancer Father     brain  . Lymphoma Brother   . Diabetes Brother   . Hypertension Brother      Physical Exam: Filed Vitals:   08/23/12 2115 08/23/12  2200 08/23/12 2300 08/24/12 0000  BP:  143/74  108/85  Pulse: 63 67    Temp:  97.4 F (36.3 C) 97.8 F (36.6 C) 97.6 F (36.4 C)  TempSrc:  Oral  Oral  Resp: 23 20  20   Height:  5\' 3"  (1.6 m)    Weight:  70.1 kg (154 lb 8.7 oz)    SpO2: 98% 99%  97%   Blood pressure 108/85, pulse 67, temperature 97.6 F (36.4 C), temperature source Oral, resp. rate 20, height 5\' 3"  (1.6 m), weight 70.1 kg (154 lb 8.7 oz), SpO2 97.00%.  GEN:  Pleasant  patient lying in the stretcher in no acute distress; cooperative with exam. PSYCH:  alert and oriented x4; does not appear anxious or depressed; affect is appropriate. HEENT: Mucous membranes pink and anicteric; PERRLA; EOM intact; no cervical lymphadenopathy nor thyromegaly or carotid bruit; no JVD; There were no stridor. Neck  is very supple. Breasts:: Not examined CHEST WALL: No tenderness CHEST: Normal respiration, clear to auscultation bilaterally.  HEART: Regular rate and rhythm.  There are no murmur, rub, or gallops.   BACK: No kyphosis or scoliosis; no CVA tenderness ABDOMEN: soft and non-tender; no masses, no organomegaly, normal abdominal bowel sounds; no pannus; no intertriginous candida. There is no rebound and no distention. Rectal Exam: Not done EXTREMITIES: No bone or joint deformity; age-appropriate arthropathy of the hands and knees; no edema; no ulcerations.  There is no calf tenderness. Genitalia: not examined PULSES: 2+ and symmetric SKIN: Normal hydration no rash or ulceration CNS: Cranial nerves 2-12 grossly intact no focal lateralizing neurologic deficit.  Speech is fluent; uvula elevated with phonation, facial symmetry and tongue midline. DTR are normal bilaterally, cerebella exam is intact, barbinski is negative and strengths are weaker on the left leg.  Upper ext are strong bilaterally.  No sensory loss.   Labs on Admission:  Basic Metabolic Panel:  Recent Labs Lab 08/23/12 1928 08/23/12 1943  NA 138 142  K 4.1 4.0  CL  100 102  CO2 27  --   GLUCOSE 97 98  BUN 16 18  CREATININE 0.99 1.10  CALCIUM 9.8  --    Liver Function Tests:  Recent Labs Lab 08/23/12 1928  AST 23  ALT 13  ALKPHOS 41  BILITOT 0.3  PROT 7.5  ALBUMIN 3.9   No results found for this basename: LIPASE, AMYLASE,  in the last 168 hours No results found for this basename: AMMONIA,  in the last 168 hours CBC:  Recent Labs Lab 08/23/12 1928 08/23/12 1943  WBC 6.9  --   NEUTROABS 3.2  --   HGB 13.0 13.3  HCT 37.2 39.0  MCV 86.9  --   PLT 153  --    Cardiac Enzymes:  Recent Labs Lab 08/23/12 1928  TROPONINI <0.30    CBG:  Recent Labs Lab 08/23/12 1946  GLUCAP 101*     Radiological Exams on Admission: Ct Head Wo Contrast  08/23/2012   *RADIOLOGY REPORT*  Clinical Data: Code stroke.  Left-sided facial droop and left-sided leg weakness.  CT HEAD WITHOUT CONTRAST  Technique:  Contiguous axial images were obtained from the base of the skull through the vertex without contrast.  Comparison: CT head without contrast 09/06/2011.  Findings: Atrophy and extensive white matter disease is similar to the prior exam.  The basal ganglia are unchanged.  The insular cortex is intact.  No acute cortical infarct, hemorrhage, or mass lesion is present.  The ventricles are proportionate to the degree of atrophy.  No significant extra-axial fluid collection is present.  The paranasal sinuses and mastoid air cells are clear.  The osseous skull is intact.  IMPRESSION:  1.  Stable atrophy and diffuse white matter disease. 2.  No acute intracranial abnormality.  These results were called by telephone on 08/23/2012 at 07:38 p.m. to Dr. Amada Jupiter, who verbally acknowledged these results.   Original Report Authenticated By: Marin Roberts, M.D.    EKG: Independently reviewed. Paced rhythm.   Assessment/Plan Present on Admission:  . CVA . G E R D . Sick sinus syndrome . Pacemaker . HTN (hypertension) . Hyperlipidemia  PLAN:  She  most likely had another acute CVA.  Will obtain carotid and cardiac sonogram.  She has a PPM so she is not a candidate for MRI imaging.  Will continue her on coumadin as per pharmacy.  Her meds are continued.  I have  order OT/PT.  She is stable and will be admitted to [facility] service, with neurology consultation.  Thank you for allowing me to partake in the care of your nice patient.  Other plans as per orders.  Code Status: FULL Unk Lightning, MD. [facility] Pager [phone] 7pm to 7am.  08/24/2012, 12:21 AM

## 2012-08-25 LAB — CBC
HCT: 36.4 % (ref 36.0–46.0)
Hemoglobin: 12.2 g/dL (ref 12.0–15.0)
MCH: 29.3 pg (ref 26.0–34.0)
MCHC: 33.5 g/dL (ref 30.0–36.0)
MCV: 87.3 fL (ref 78.0–100.0)
Platelets: 135 10*3/uL — ABNORMAL LOW (ref 150–400)
RBC: 4.17 MIL/uL (ref 3.87–5.11)
RDW: 14.2 % (ref 11.5–15.5)
WBC: 5.4 10*3/uL (ref 4.0–10.5)

## 2012-08-25 LAB — BASIC METABOLIC PANEL
BUN: 15 mg/dL (ref 6–23)
CO2: 27 mEq/L (ref 19–32)
Calcium: 10.2 mg/dL (ref 8.4–10.5)
Chloride: 105 mEq/L (ref 96–112)
Creatinine, Ser: 0.86 mg/dL (ref 0.50–1.10)
GFR calc Af Amer: 71 mL/min — ABNORMAL LOW (ref >90)
GFR calc non Af Amer: 62 mL/min — ABNORMAL LOW (ref >90)
Glucose, Bld: 93 mg/dL (ref 70–99)
Potassium: 4.1 mEq/L (ref 3.5–5.1)
Sodium: 141 mEq/L (ref 135–145)

## 2012-08-25 LAB — PROTIME-INR
INR: 2.66 — ABNORMAL HIGH (ref 0.00–1.49)
Prothrombin Time: 27.4 seconds — ABNORMAL HIGH (ref 11.6–15.2)

## 2012-08-25 NOTE — Care Management Note (Signed)
    Page 1 of 1   08/25/2012     2:54:01 PM   CARE MANAGEMENT NOTE 08/25/2012  Patient:  Debra Lynn, Debra Lynn   Account Number:  [id number]  Date Initiated:  08/24/2012  Documentation initiated by:  Jiles Crocker  Subjective/Objective Assessment:   ADMITTED FOR STROKE     Action/Plan:   PCP:   Daisy Floro, MD  LIVES WITH SPOUSE; CM FOLLOWING FOR DCP; AWAITING FOR PT/OT EVALS   Anticipated DC Date:  08/31/2012   Anticipated DC Plan:  HOME W HOME HEALTH SERVICES      DC Planning Services  CM consult      Choice offered to / List presented to:             Status of service:  Completed, signed off Medicare Important Message given?  NA - LOS <3 / Initial given by admissions (If response is "NO", the following Medicare IM given date fields will be blank) Date Medicare IM given:   Date Additional Medicare IM given:    Discharge Disposition:  HOME/SELF CARE  Per UR Regulation:  Reviewed for med. necessity/level of care/duration of stay  If discussed at Long Length of Stay Meetings, dates discussed:    Comments:  08/25/12 1430  Elmer Bales RN, MSN, CM- Met with patient and daughter to discuss outpatient physical therapy.  Pt is agreeable to outpatient Neuro Rehab. Information faxed out.   08/24/2012- B CHANDLER RN,BSN,MHA

## 2012-08-25 NOTE — Progress Notes (Signed)
Stroke Team Progress Note  HISTORY Debra Lynn is a 79 y.o. female with a history of atrial fibrillation and previous strokes who presents with sudden onset left leg weakness that started around 6 PM on 08/23/2012. She states that she often gets lightheaded when she leans over, but today it seems worse - she is unclear the time her lightheadedness started/got worse. She denies any true vertigo. Around 6 PM, she noticed that her left leg was numb, and felt weak and therefore called 911 out of concern for stroke. She denies involvement of her arm. Patient was not a TPA candidate secondary to unclear time of onset. She was admitted for further evaluation and treatment.  Patient has PACEMAKER.  SUBJECTIVE Patient lying in room. She states that she is back at baseline, able to move her left side. Has had previous stroke but at baseline. Family in room.   OBJECTIVE Most recent Vital Signs: Filed Vitals:   08/24/12 1840 08/24/12 2103 08/25/12 0105 08/25/12 0628  BP: 107/62 134/81 121/69 150/80  Pulse: 66 66 67 67  Temp: 97.4 F (36.3 C) 98 F (36.7 C) 97.5 F (36.4 C) 97.9 F (36.6 C)  TempSrc: Oral Oral Oral Oral  Resp: 20 18 18 20   Height:      Weight:      SpO2: 99% 99% 99% 97%   CBG (last 3)   Recent Labs  08/24/12 0705 08/24/12 1148 08/24/12 2114  GLUCAP 99 103* 110*    IV Fluid Intake:     MEDICATIONS  . acidophilus  1 capsule Oral Daily  . calcium-vitamin D  1 tablet Oral BID WC  . cephALEXin  250 mg Oral QHS  . colestipol  1 g Oral QHS  . cycloSPORINE  1 drop Both Eyes Q12H  . furosemide  40 mg Oral Daily  . levETIRAcetam  250 mg Oral Q12H  . losartan  25 mg Oral QODAY  . pantoprazole  40 mg Oral Daily  . potassium chloride SA  20 mEq Oral QHS  . propranolol  60 mg Oral BID  . simvastatin  20 mg Oral q1800  . warfarin  1.25 mg Oral Custom  . warfarin  2.5 mg Oral Once  . warfarin  2.5 mg Oral Custom  . Warfarin - Pharmacist Dosing Inpatient   Does not apply  q1800   PRN:  acetaminophen  Diet:  Cardiac thin liquids Activity:  Bathroom privileges with assistance DVT Prophylaxis:  Warfarin, INF 2.45  CLINICALLY SIGNIFICANT STUDIES Basic Metabolic Panel:   Recent Labs Lab 08/23/12 1928 08/23/12 1943 08/25/12 0620  NA 138 142 141  K 4.1 4.0 4.1  CL 100 102 105  CO2 27  --  27  GLUCOSE 97 98 93  BUN 16 18 15   CREATININE 0.99 1.10 0.86  CALCIUM 9.8  --  10.2   Liver Function Tests:   Recent Labs Lab 08/23/12 1928  AST 23  ALT 13  ALKPHOS 41  BILITOT 0.3  PROT 7.5  ALBUMIN 3.9   CBC:   Recent Labs Lab 08/23/12 1928 08/23/12 1943 08/25/12 0620  WBC 6.9  --  5.4  NEUTROABS 3.2  --   --   HGB 13.0 13.3 12.2  HCT 37.2 39.0 36.4  MCV 86.9  --  87.3  PLT 153  --  135*   Coagulation:   Recent Labs Lab 08/23/12 1928 08/24/12 0630 08/25/12 0620  LABPROT 25.8* 25.8* 27.4*  INR 2.45* 2.45* 2.66*   Cardiac Enzymes:  Recent Labs Lab 08/23/12 1928  TROPONINI <0.30   Urinalysis:   Recent Labs Lab 08/23/12 2052  COLORURINE YELLOW  LABSPEC 1.010  PHURINE 7.0  GLUCOSEU NEGATIVE  HGBUR NEGATIVE  BILIRUBINUR NEGATIVE  KETONESUR NEGATIVE  PROTEINUR 30*  UROBILINOGEN 0.2  NITRITE NEGATIVE  LEUKOCYTESUR MODERATE*   Lipid Panel    Component Value Date/Time   CHOL 190 08/24/2012 0630   TRIG 229* 08/24/2012 0630   HDL 62 08/24/2012 0630   CHOLHDL 3.1 08/24/2012 0630   VLDL 46* 08/24/2012 0630   LDLCALC 82 08/24/2012 0630   HgbA1C  Lab Results  Component Value Date   HGBA1C 5.5 08/24/2012    Urine Drug Screen:     Component Value Date/Time   LABOPIA NONE DETECTED 08/23/2012 2052   COCAINSCRNUR NONE DETECTED 08/23/2012 2052   LABBENZ NONE DETECTED 08/23/2012 2052   AMPHETMU NONE DETECTED 08/23/2012 2052   THCU NONE DETECTED 08/23/2012 2052   LABBARB NONE DETECTED 08/23/2012 2052    Alcohol Level:   Recent Labs Lab 08/23/12 1928  ETH <11   CT of the brain  08/23/2012  1.  Stable atrophy and diffuse white  matter disease. 2.  No acute intracranial abnormality.   MRI of the brain  PACEMAKER  MRA of the brain    2D Echocardiogram  EF 55%. No cardiac source of emboli was indentified  Carotid Doppler  No evidence of hemodynamically significant internal carotid artery stenosis. Vertebral artery flow is antegrade.   CXR      EKG     Therapy Recommendations out patient PT, no OT   Physical Exam   Patient is awake, alert, oriented to person, place, month, year, and situation.  Immediate and remote memory are intact.  Patient is able to give a clear and coherent history.  No signs of aphasia or neglect  Cranial Nerves:  II: Visual Fields are full. Pupils are equal, round, and reactive to light. III,IV, VI: EOMI without ptosis or diploplia.  V: Facial sensation is symmetric to temperature  VII: Facial movement is notable for mild left facial droop  VIII: hearing is intact to voice  X: Uvula elevates symmetrically  XI: Shoulder shrug is symmetric.  XII: tongue is midline without atrophy or fasciculations.  Motor:  5/5 UE/LE bilaterally, at baseline per patient.  Sensory:  Intact bilaterally   ASSESSMENT Debra Lynn is a 79 y.o. female presenting with left hemiparesis, light headedness . Imaging confirms diffuse white matter disease.  On warfarin prior to admission. Now on warfarin for secondary stroke prevention. Symptoms felt secondary to embolic source, atrial fibrillation. Patient with resultant left hemiparesis worsened from her baseline gait abnormality from her previous stroke. Work up underway.   Sick sinus symdrome, s/p PPM  Stroke  Atrial fibrillation  Long term medication use, coumadin, admit INR 2.65  Hypertension  Hyperlipidemia, LDL 82  Breast cancer 2012  Neuromuscular disorder~Parkinsons  HGB A1C  5.5   Urinary tract infection, treated  Hospital day # 2  TREATMENT/PLAN  Continue warfarin for secondary stroke prevention, INR 2.65  Risk factor  modification  Outpatient PT recommended, no OT therapy recommended  On antibiotics for UTI.  Cannot have MRI due to PPM   Stroke service will sign off.  Follow up with Dr. Pearlean Brownie in 2 months.  Gwendolyn Lima. Manson Passey, [facility], MBA, MHA [facility] Pager: [phone] 08/25/2012 8:15 AM

## 2012-08-25 NOTE — Progress Notes (Signed)
ANTICOAGULATION CONSULT NOTE - Follow Up Consult  Pharmacy Consult for Coumadin Indication: atrial fibrillation  No Known Allergies  Patient Measurements: Height: 5\' 3"  (160 cm) Weight: 154 lb 8.7 oz (70.1 kg) IBW/kg (Calculated) : 52.4  Vital Signs: Temp: 97.6 F (36.4 C) (07/25 1331) Temp src: Oral (07/25 1331) BP: 109/60 mmHg (07/25 1331) Pulse Rate: 60 (07/25 1331)  Labs:  Recent Labs  08/23/12 1928 08/23/12 1943 08/24/12 0630 08/25/12 0620  HGB 13.0 13.3  --  12.2  HCT 37.2 39.0  --  36.4  PLT 153  --   --  135*  APTT 38*  --   --   --   LABPROT 25.8*  --  25.8* 27.4*  INR 2.45*  --  2.45* 2.66*  CREATININE 0.99 1.10  --  0.86  TROPONINI <0.30  --   --   --     Estimated Creatinine Clearance: 48.2 ml/min (by C-G formula based on Cr of 0.86).  Assessment: 83yof presented to the ED as a Code Stroke. CT head negative for bleed. Cannot do MRI due to PPM. Home coumadin resumed for afib. INR remains therapeutic on home regimen. CBC is stable. No bleeding reported.  Goal of Therapy:  INR 2-3 Monitor platelets by anticoagulation protocol: Yes   Plan:  1) Continue home regimen (will get 2.5mg  tonight) 2) Follow up INR in AM - considering changing checks to 3x week if INR remains stable  Fredrik Rigger 08/25/2012,3:25 PM

## 2012-08-25 NOTE — Discharge Summary (Signed)
Physician Discharge Summary  Debra Lynn:[id number] DOB: 03-May-1928 DOA: 08/23/2012  PCP: Daisy Floro, MD  Admit date: 08/23/2012 Discharge date: 08/25/2012  Time spent: 35 minutes  Recommendations for Outpatient Follow-up:  1. Pt/INR 2. Outpt pt  Discharge Diagnoses:  Principal Problem:   CVA Active Problems:   G E R D   Acute ischemic stroke   Sick sinus syndrome   Pacemaker   HTN (hypertension)   Hyperlipidemia   A-fib   Discharge Condition: improved  Diet recommendation: cardac  Filed Weights   08/23/12 1935 08/23/12 2200  Weight: 69.854 kg (154 lb) 70.1 kg (154 lb 8.7 oz)    History of present illness:  Debra Lynn is an 79 y.o. female with hx of prior stroke, with residual dysarthria, afib s/p ablation with subsequent PPM on therapeutic coumadin, INR 2.45, presents to the ER with sudden onset of left lower leg weakness and left facial droop. She came as a code stroke, but was not a TPA candidate because she is on coumadin. Neurology saw her and recommended that she be admitted for finishing her CVA work up. Her CT of the head was negative. Clinically, she presented as an acute CVA, likely embolic given hx of afib.   Hospital Course:  TIA: Continue warfarin for secondary stroke prevention, INR 2.65 Risk factor modification Outpatient PT recommended, no OT therapy recommended On antibiotics for UTI. Cannot have MRI due to PPM . Follow up with Dr. Pearlean Brownie in 2 months.    Consultations:  neuro  Discharge Exam: Filed Vitals:   08/24/12 2103 08/25/12 0105 08/25/12 0628 08/25/12 0957  BP: 134/81 121/69 150/80 126/69  Pulse: 66 67 67 63  Temp: 98 F (36.7 C) 97.5 F (36.4 C) 97.9 F (36.6 C) 97.6 F (36.4 C)  TempSrc: Oral Oral Oral Oral  Resp: 18 18 20 18   Height:      Weight:      SpO2: 99% 99% 97% 97%    General: A+Ox3, NAd Cardiovascular: irr Respiratory: clear ant  Discharge Instructions  Discharge Orders   Future Orders Complete By  Expires     Diet - low sodium heart healthy  As directed     Discharge instructions  As directed     Comments:      Outpatient PT PT/INR as directed by PCP    Increase activity slowly  As directed         Medication List         acetaminophen 500 MG tablet  Commonly known as:  TYLENOL  Take 1,000 mg by mouth every 6 (six) hours as needed. For pain.     ALIGN 4 MG Caps  Take 4 mg by mouth daily.     benzonatate 100 MG capsule  Commonly known as:  TESSALON  Take 100 mg by mouth 3 (three) times daily as needed for cough.     calcium-vitamin D 500-200 MG-UNIT per tablet  Take 1 tablet by mouth 2 (two) times daily with a meal.     CENTRUM SILVER tablet  Take 1 tablet by mouth daily.     cephALEXin 250 MG capsule  Commonly known as:  KEFLEX  Take 1 capsule by mouth at bedtime.     colestipol 1 G tablet  Commonly known as:  COLESTID  Take 1 g by mouth at bedtime.     furosemide 40 MG tablet  Commonly known as:  LASIX  Take 40 mg by mouth daily.  levETIRAcetam 500 MG tablet  Commonly known as:  KEPPRA  Take 250 mg by mouth every 12 (twelve) hours.     lidocaine 5 %  Commonly known as:  LIDODERM  Place 1 patch onto the skin daily. Remove & Discard patch within 12 hours or as directed by MD     losartan 50 MG tablet  Commonly known as:  COZAAR  Take 25 mg by mouth every other day.     pantoprazole 40 MG tablet  Commonly known as:  PROTONIX  Take 1 tablet by mouth daily.     potassium chloride SA 20 MEQ tablet  Commonly known as:  K-DUR,KLOR-CON  Take 1 tablet by mouth at bedtime.     pravastatin 40 MG tablet  Commonly known as:  PRAVACHOL  Take 80 mg by mouth at bedtime.     propranolol 60 MG tablet  Commonly known as:  INDERAL  Take 60 mg by mouth 2 (two) times daily.     RESTASIS 0.05 % ophthalmic emulsion  Generic drug:  cycloSPORINE  Place 1 drop into both eyes every 12 (twelve) hours.     warfarin 2.5 MG tablet  Commonly known as:  COUMADIN   Take 1.25-2.5 mg by mouth every evening. Takes 2.5 mg on Mon Wed and Friday and a half tablet all other days       No Known Allergies    The results of significant diagnostics from this hospitalization (including imaging, microbiology, ancillary and laboratory) are listed below for reference.    Significant Diagnostic Studies: Ct Head Wo Contrast  08/23/2012   *RADIOLOGY REPORT*  Clinical Data: Code stroke.  Left-sided facial droop and left-sided leg weakness.  CT HEAD WITHOUT CONTRAST  Technique:  Contiguous axial images were obtained from the base of the skull through the vertex without contrast.  Comparison: CT head without contrast 09/06/2011.  Findings: Atrophy and extensive white matter disease is similar to the prior exam.  The basal ganglia are unchanged.  The insular cortex is intact.  No acute cortical infarct, hemorrhage, or mass lesion is present.  The ventricles are proportionate to the degree of atrophy.  No significant extra-axial fluid collection is present.  The paranasal sinuses and mastoid air cells are clear.  The osseous skull is intact.  IMPRESSION:  1.  Stable atrophy and diffuse white matter disease. 2.  No acute intracranial abnormality.  These results were called by telephone on 08/23/2012 at 07:38 p.m. to Dr. Amada Jupiter, who verbally acknowledged these results.   Original Report Authenticated By: Marin Roberts, M.D.    Microbiology: Recent Results (from the past 240 hour(s))  URINE CULTURE     Status: None   Collection Time    08/23/12  8:52 PM      Result Value Range Status   Specimen Description URINE, CLEAN CATCH   Final   Special Requests NONE   Final   Culture  Setup Time 08/24/2012 02:33   Final   Colony Count 80,000 COLONIES/ML   Final   Culture     Final   Value: Multiple bacterial morphotypes present, none predominant. Suggest appropriate recollection if clinically indicated.   Report Status 08/24/2012 FINAL   Final     Labs: Basic Metabolic  Panel:  Recent Labs Lab 08/23/12 1928 08/23/12 1943 08/25/12 0620  NA 138 142 141  K 4.1 4.0 4.1  CL 100 102 105  CO2 27  --  27  GLUCOSE 97 98 93  BUN 16 18 15  CREATININE 0.99 1.10 0.86  CALCIUM 9.8  --  10.2   Liver Function Tests:  Recent Labs Lab 08/23/12 1928  AST 23  ALT 13  ALKPHOS 41  BILITOT 0.3  PROT 7.5  ALBUMIN 3.9   No results found for this basename: LIPASE, AMYLASE,  in the last 168 hours No results found for this basename: AMMONIA,  in the last 168 hours CBC:  Recent Labs Lab 08/23/12 1928 08/23/12 1943 08/25/12 0620  WBC 6.9  --  5.4  NEUTROABS 3.2  --   --   HGB 13.0 13.3 12.2  HCT 37.2 39.0 36.4  MCV 86.9  --  87.3  PLT 153  --  135*   Cardiac Enzymes:  Recent Labs Lab 08/23/12 1928  TROPONINI <0.30   BNP: BNP (last 3 results) No results found for this basename: PROBNP,  in the last 8760 hours CBG:  Recent Labs Lab 08/23/12 1946 08/24/12 0705 08/24/12 1148 08/24/12 2114  GLUCAP 101* 99 103* 110*       Signed:  Morton Lynn  [facility] 08/25/2012, 12:21 PM

## 2012-08-25 NOTE — Progress Notes (Signed)
Physical Therapy Treatment Patient Details Name: Debra Lynn MRN: [id number] DOB: December 27, 1928 Today's Date: 08/25/2012 Time: 1010-1046 PT Time Calculation (min): 36 min  PT Assessment / Plan / Recommendation  History of Present Illness Debra Lynn is an 79 y.o. female with hx of prior stroke, with residual dysarthria, newly diagnosed parkinsons (not full diagnosis but tremor, gait symptoms), afib s/p ablation with subsequent PPM on therapeutic coumadin, INR 2.45, presents to the ER with sudden onset of left lower leg weakness and left facial droop.      PT Comments   More stable using RW. Patient and family agreeable to using this until she gets stronger with therapy. Educated patient and family on LSVT (Big) therapy for Parkinsons patient and provided a handout.   Follow Up Recommendations  Outpatient PT;Supervision/Assistance - 24 hour     Does the patient have the potential to tolerate intense rehabilitation     Barriers to Discharge        Equipment Recommendations  None recommended by PT    Recommendations for Other Services    Frequency Min 4X/week   Progress towards PT Goals Progress towards PT goals: Progressing toward goals  Plan Current plan remains appropriate    Precautions / Restrictions Precautions Precautions: Fall Restrictions Weight Bearing Restrictions: No   Pertinent Vitals/Pain Denies pain    Mobility  Bed Mobility Bed Mobility: Supine to Sit Supine to Sit: 6: Modified independent (Device/Increase time);HOB elevated Transfers Transfers: Sit to Stand;Stand to Sit Sit to Stand: 5: Supervision;With upper extremity assist Stand to Sit: 5: Supervision;With upper extremity assist Details for Transfer Assistance: cues for safe hand placement Ambulation/Gait Ambulation/Gait Assistance: 4: Min assist;4: Min guard;5: Supervision Ambulation Distance (Feet): 550 Feet Ambulation/Gait Assistance Details: min HHA to and from the bathroom this morning for  stability without AD, pt then attempted use of SPC needing cues for sequencing of cane and stepping as well as close gaurding for stability as pt still demonstrating lateral sway and delayed reaction time; with RW pts speed, confidence and stability improved needing only supervision for safety Gait Pattern: Step-through pattern;Shuffle;Decreased stride length Gait velocity: decreased however improved with use of RW, pt able to increase her speed with RW while maintaining forward gaze General Gait Details: decreased step height and length, downward gaze, cautious and gaurded stance, decreased trunk rotation; forward gaze improved with cueing and use of RW      PT Goals (current goals can now be found in the care plan section)    Visit Information  Last PT Received On: 08/25/12 Assistance Needed: +1 History of Present Illness: Debra Lynn is an 79 y.o. female with hx of prior stroke, with residual dysarthria, newly diagnosed parkinsons (not full diagnosis but tremor, gait symptoms), afib s/p ablation with subsequent PPM on therapeutic coumadin, INR 2.45, presents to the ER with sudden onset of left lower leg weakness and left facial droop.    Subjective Data      Cognition  Cognition Arousal/Alertness: Awake/alert Behavior During Therapy: WFL for tasks assessed/performed Overall Cognitive Status: History of cognitive impairments - at baseline Area of Impairment: Memory;Problem solving Problem Solving: Slow processing General Comments: back at baseline per daughter and Pt    Balance  High Level Balance High Level Balance Comments: speed changes and head turns with suprvision for safety  End of Session PT - End of Session Equipment Utilized During Treatment: Gait belt Activity Tolerance: Patient tolerated treatment well Patient left: in chair;with call bell/phone within reach;with family/visitor present  Nurse Communication: Mobility status   GP     [facility] HELEN 08/25/2012,  11:14 AM

## 2012-08-30 ENCOUNTER — Ambulatory Visit: Payer: Medicare Other | Attending: Internal Medicine | Admitting: Physical Therapy

## 2012-08-30 DIAGNOSIS — R279 Unspecified lack of coordination: Secondary | ICD-10-CM | POA: Insufficient documentation

## 2012-08-30 DIAGNOSIS — M6281 Muscle weakness (generalized): Secondary | ICD-10-CM | POA: Insufficient documentation

## 2012-08-30 DIAGNOSIS — IMO0001 Reserved for inherently not codable concepts without codable children: Secondary | ICD-10-CM | POA: Insufficient documentation

## 2012-08-30 DIAGNOSIS — R262 Difficulty in walking, not elsewhere classified: Secondary | ICD-10-CM | POA: Insufficient documentation

## 2012-08-31 ENCOUNTER — Ambulatory Visit (INDEPENDENT_AMBULATORY_CARE_PROVIDER_SITE_OTHER): Payer: Medicare Other

## 2012-08-31 DIAGNOSIS — J309 Allergic rhinitis, unspecified: Secondary | ICD-10-CM

## 2012-09-04 LAB — PACEMAKER DEVICE OBSERVATION

## 2012-09-05 ENCOUNTER — Ambulatory Visit: Payer: Medicare Other | Attending: Internal Medicine | Admitting: Physical Therapy

## 2012-09-05 DIAGNOSIS — R262 Difficulty in walking, not elsewhere classified: Secondary | ICD-10-CM | POA: Insufficient documentation

## 2012-09-05 DIAGNOSIS — M6281 Muscle weakness (generalized): Secondary | ICD-10-CM | POA: Insufficient documentation

## 2012-09-05 DIAGNOSIS — IMO0001 Reserved for inherently not codable concepts without codable children: Secondary | ICD-10-CM | POA: Insufficient documentation

## 2012-09-05 DIAGNOSIS — R279 Unspecified lack of coordination: Secondary | ICD-10-CM | POA: Insufficient documentation

## 2012-09-07 ENCOUNTER — Ambulatory Visit: Payer: Medicare Other

## 2012-09-08 ENCOUNTER — Ambulatory Visit (INDEPENDENT_AMBULATORY_CARE_PROVIDER_SITE_OTHER): Payer: Medicare Other

## 2012-09-08 ENCOUNTER — Ambulatory Visit: Payer: Medicare Other | Admitting: Physical Therapy

## 2012-09-08 DIAGNOSIS — J309 Allergic rhinitis, unspecified: Secondary | ICD-10-CM

## 2012-09-11 ENCOUNTER — Ambulatory Visit: Payer: Medicare Other | Admitting: Physical Therapy

## 2012-09-13 ENCOUNTER — Ambulatory Visit: Payer: Medicare Other | Admitting: Physical Therapy

## 2012-09-14 ENCOUNTER — Ambulatory Visit (INDEPENDENT_AMBULATORY_CARE_PROVIDER_SITE_OTHER): Payer: Medicare Other

## 2012-09-14 ENCOUNTER — Ambulatory Visit: Payer: Medicare Other | Admitting: Physical Therapy

## 2012-09-14 DIAGNOSIS — J309 Allergic rhinitis, unspecified: Secondary | ICD-10-CM

## 2012-09-18 ENCOUNTER — Telehealth: Payer: Self-pay | Admitting: Oncology

## 2012-09-18 ENCOUNTER — Ambulatory Visit: Payer: Medicare Other | Admitting: Physical Therapy

## 2012-09-18 NOTE — Telephone Encounter (Signed)
Faxed pt medical records to [employer] °

## 2012-09-21 ENCOUNTER — Ambulatory Visit: Payer: Medicare Other

## 2012-09-22 ENCOUNTER — Ambulatory Visit: Payer: Medicare Other | Admitting: Physical Therapy

## 2012-09-22 ENCOUNTER — Ambulatory Visit (INDEPENDENT_AMBULATORY_CARE_PROVIDER_SITE_OTHER): Payer: Medicare Other

## 2012-09-22 DIAGNOSIS — J309 Allergic rhinitis, unspecified: Secondary | ICD-10-CM

## 2012-09-25 ENCOUNTER — Ambulatory Visit: Payer: Medicare Other | Admitting: Physical Therapy

## 2012-09-27 ENCOUNTER — Ambulatory Visit: Payer: Medicare Other | Admitting: Physical Therapy

## 2012-09-27 ENCOUNTER — Ambulatory Visit (INDEPENDENT_AMBULATORY_CARE_PROVIDER_SITE_OTHER): Payer: Medicare Other

## 2012-09-27 DIAGNOSIS — J309 Allergic rhinitis, unspecified: Secondary | ICD-10-CM

## 2012-09-28 ENCOUNTER — Ambulatory Visit: Payer: Medicare Other

## 2012-10-03 ENCOUNTER — Ambulatory Visit: Payer: Medicare Other | Admitting: Physical Therapy

## 2012-10-04 ENCOUNTER — Ambulatory Visit: Payer: Medicare Other

## 2012-10-05 ENCOUNTER — Ambulatory Visit (INDEPENDENT_AMBULATORY_CARE_PROVIDER_SITE_OTHER): Payer: Medicare Other

## 2012-10-05 ENCOUNTER — Ambulatory Visit: Payer: Medicare Other | Attending: Internal Medicine | Admitting: Physical Therapy

## 2012-10-05 DIAGNOSIS — IMO0001 Reserved for inherently not codable concepts without codable children: Secondary | ICD-10-CM | POA: Insufficient documentation

## 2012-10-05 DIAGNOSIS — M6281 Muscle weakness (generalized): Secondary | ICD-10-CM | POA: Insufficient documentation

## 2012-10-05 DIAGNOSIS — R279 Unspecified lack of coordination: Secondary | ICD-10-CM | POA: Insufficient documentation

## 2012-10-05 DIAGNOSIS — R262 Difficulty in walking, not elsewhere classified: Secondary | ICD-10-CM | POA: Insufficient documentation

## 2012-10-05 DIAGNOSIS — J309 Allergic rhinitis, unspecified: Secondary | ICD-10-CM

## 2012-10-09 ENCOUNTER — Ambulatory Visit: Payer: Medicare Other | Admitting: Physical Therapy

## 2012-10-10 ENCOUNTER — Ambulatory Visit: Payer: Medicare Other | Admitting: Physical Therapy

## 2012-10-12 ENCOUNTER — Ambulatory Visit: Payer: Medicare Other | Admitting: Physical Therapy

## 2012-10-12 ENCOUNTER — Ambulatory Visit: Payer: Medicare Other

## 2012-10-13 ENCOUNTER — Ambulatory Visit (INDEPENDENT_AMBULATORY_CARE_PROVIDER_SITE_OTHER): Payer: Medicare Other

## 2012-10-13 ENCOUNTER — Ambulatory Visit: Payer: Medicare Other | Admitting: Physical Therapy

## 2012-10-13 DIAGNOSIS — J309 Allergic rhinitis, unspecified: Secondary | ICD-10-CM

## 2012-10-16 ENCOUNTER — Ambulatory Visit: Payer: Medicare Other | Admitting: Physical Therapy

## 2012-10-17 ENCOUNTER — Ambulatory Visit: Payer: Medicare Other | Admitting: Physical Therapy

## 2012-10-19 ENCOUNTER — Ambulatory Visit: Payer: Medicare Other | Admitting: Physical Therapy

## 2012-10-19 ENCOUNTER — Ambulatory Visit (INDEPENDENT_AMBULATORY_CARE_PROVIDER_SITE_OTHER): Payer: Medicare Other

## 2012-10-19 DIAGNOSIS — J309 Allergic rhinitis, unspecified: Secondary | ICD-10-CM

## 2012-10-23 ENCOUNTER — Ambulatory Visit: Payer: Medicare Other | Admitting: Physical Therapy

## 2012-10-23 ENCOUNTER — Ambulatory Visit (INDEPENDENT_AMBULATORY_CARE_PROVIDER_SITE_OTHER): Payer: Medicare Other

## 2012-10-23 DIAGNOSIS — J309 Allergic rhinitis, unspecified: Secondary | ICD-10-CM

## 2012-10-24 ENCOUNTER — Ambulatory Visit: Payer: Medicare Other | Admitting: Physical Therapy

## 2012-10-25 ENCOUNTER — Other Ambulatory Visit: Payer: Self-pay | Admitting: Internal Medicine

## 2012-10-26 ENCOUNTER — Ambulatory Visit: Payer: Medicare Other | Admitting: Physical Therapy

## 2012-10-26 ENCOUNTER — Ambulatory Visit (INDEPENDENT_AMBULATORY_CARE_PROVIDER_SITE_OTHER): Payer: Medicare Other

## 2012-10-26 DIAGNOSIS — J309 Allergic rhinitis, unspecified: Secondary | ICD-10-CM

## 2012-10-26 DIAGNOSIS — Z23 Encounter for immunization: Secondary | ICD-10-CM

## 2012-10-27 ENCOUNTER — Telehealth: Payer: Self-pay | Admitting: Internal Medicine

## 2012-10-27 DIAGNOSIS — Z23 Encounter for immunization: Secondary | ICD-10-CM

## 2012-10-27 MED ORDER — BENZONATATE 100 MG PO CAPS: 100.0000 mg | ORAL_CAPSULE | Freq: Three times a day (TID) | ORAL | Status: DC | PRN

## 2012-10-27 NOTE — Telephone Encounter (Signed)
Last refill for benzonatate was 06/08/11 #90 x 11 refills. Please advise if okay to refill Dr. Maple Hudson thanks Last OV 05/27/11 Pending 11/20/12

## 2012-10-27 NOTE — Telephone Encounter (Signed)
I spoke with pt and is aware RX has been sent. Nothing further needed 

## 2012-10-27 NOTE — Telephone Encounter (Signed)
Per CY-okay to refill. Thanks.  

## 2012-10-30 ENCOUNTER — Ambulatory Visit: Payer: Medicare Other | Admitting: Physical Therapy

## 2012-11-01 ENCOUNTER — Ambulatory Visit: Payer: Medicare Other | Attending: Internal Medicine | Admitting: Physical Therapy

## 2012-11-01 DIAGNOSIS — M6281 Muscle weakness (generalized): Secondary | ICD-10-CM | POA: Insufficient documentation

## 2012-11-01 DIAGNOSIS — R279 Unspecified lack of coordination: Secondary | ICD-10-CM | POA: Insufficient documentation

## 2012-11-01 DIAGNOSIS — IMO0001 Reserved for inherently not codable concepts without codable children: Secondary | ICD-10-CM | POA: Insufficient documentation

## 2012-11-01 DIAGNOSIS — R262 Difficulty in walking, not elsewhere classified: Secondary | ICD-10-CM | POA: Insufficient documentation

## 2012-11-02 ENCOUNTER — Ambulatory Visit (INDEPENDENT_AMBULATORY_CARE_PROVIDER_SITE_OTHER): Payer: Medicare Other

## 2012-11-02 DIAGNOSIS — J309 Allergic rhinitis, unspecified: Secondary | ICD-10-CM

## 2012-11-06 ENCOUNTER — Ambulatory Visit: Payer: Medicare Other | Admitting: Physical Therapy

## 2012-11-08 ENCOUNTER — Ambulatory Visit (INDEPENDENT_AMBULATORY_CARE_PROVIDER_SITE_OTHER): Payer: Medicare Other | Admitting: Pharmacist

## 2012-11-08 DIAGNOSIS — I6789 Other cerebrovascular disease: Secondary | ICD-10-CM

## 2012-11-08 DIAGNOSIS — I4891 Unspecified atrial fibrillation: Secondary | ICD-10-CM

## 2012-11-08 HISTORY — DX: Other cerebrovascular disease: I67.89

## 2012-11-08 LAB — POCT INR: INR: 2.5

## 2012-11-09 ENCOUNTER — Ambulatory Visit (INDEPENDENT_AMBULATORY_CARE_PROVIDER_SITE_OTHER): Payer: Medicare Other

## 2012-11-09 ENCOUNTER — Ambulatory Visit: Payer: Medicare Other | Admitting: Physical Therapy

## 2012-11-09 DIAGNOSIS — J309 Allergic rhinitis, unspecified: Secondary | ICD-10-CM

## 2012-11-16 ENCOUNTER — Ambulatory Visit (INDEPENDENT_AMBULATORY_CARE_PROVIDER_SITE_OTHER): Payer: Medicare Other

## 2012-11-16 DIAGNOSIS — J309 Allergic rhinitis, unspecified: Secondary | ICD-10-CM

## 2012-11-20 ENCOUNTER — Ambulatory Visit (INDEPENDENT_AMBULATORY_CARE_PROVIDER_SITE_OTHER): Payer: Medicare Other | Admitting: Internal Medicine

## 2012-11-20 ENCOUNTER — Encounter: Payer: Self-pay | Admitting: Internal Medicine

## 2012-11-20 VITALS — BP 110/62 | HR 72 | Ht 62.0 in | Wt 152.2 lb

## 2012-11-20 DIAGNOSIS — R05 Cough: Secondary | ICD-10-CM

## 2012-11-20 DIAGNOSIS — R059 Cough, unspecified: Secondary | ICD-10-CM

## 2012-11-20 DIAGNOSIS — J301 Allergic rhinitis due to pollen: Secondary | ICD-10-CM

## 2012-11-20 MED ORDER — BENZONATATE 100 MG PO CAPS: 100.0000 mg | ORAL_CAPSULE | Freq: Three times a day (TID) | ORAL | Status: DC | PRN

## 2012-11-20 MED ORDER — AZELASTINE-FLUTICASONE 137-50 MCG/ACT NA SUSP: 2.0000 | Freq: Every day | NASAL | Status: DC

## 2012-11-20 NOTE — Progress Notes (Signed)
11/06/10- 79 year old female never smoker followed for allergic rhinosinusitis, chronic cough, complicated by CVA, GERD, pacemaker, HBP.Marland Kitchen daughter here Last here -11/07/2009 She got flu vaccine. Continues allergy vaccine at 1-10 without problem. Cough has persisted over the last few months. It occasionally wakes her but she takes a little Tussionex each night to permit the per sleep. Occasional antihistamine. They recognize no relationship to whether or external factors. She may choke a little sometimes while swallowing but it is not much. Breathing is otherwise comfortable. She had a swallowing evaluation years ago. Does not recognize reflux or postnasal drip or wheeze. Since last here had pacemaker placed.  05/27/11- 79 year old female never smoker followed for allergic rhinosinusitis, chronic cough, complicated by CVA, GERD, pacemaker, HBP.Marland Kitchen daughter here Since last here had a right mastectomy without radiation or chemotherapy, on December 14. No respiratory complications with surgery. Has been wheezing in the past month, mostly mild and with exertion. Modified barium swallow 11/09/2010 showed poor esophageal emptying with a reflux cough. CXR 01/13/2011-right pacemaker. Clear lungs. She remains on chronic propranolol. Stopping it did not relieve her cough. She does use Tussionex.  11/20/12- 79 year old female never smoker followed for allergic rhinosinusitis, chronic cough, complicated by CVA, GERD, pacemaker, HBP.Marland Kitchen daughter here Follows For: Dry cough ( some better with Tessalon) - Runny nose for past 2 days - Some sob with exertion -  Has had another CVA. Hx glaucoma sgy.   ROS-Per HPI Constitutional:   No-   weight loss, night sweats, fevers, chills, fatigue, lassitude. HEENT:   No-  headaches, difficulty swallowing, tooth/dental problems, sore throat,       No-  sneezing, itching, ear ache, nasal congestion, +post nasal drip,  CV:  No-   chest pain, orthopnea, PND, swelling in lower  extremities, anasarca, dizziness, palpitations Resp: No-   shortness of breath with exertion or at rest.              No-   productive cough,  + non-productive cough,  No-  coughing up of blood.              No-   change in color of mucus.  + wheezing.   Skin: No-   rash or lesions. GI:  No-   heartburn, indigestion, abdominal pain, nausea, vomiting,  GU:  MS:  No-   joint pain or swelling.   Neuro- nothing unusual Psych:  No- change in mood or affect. No depression or anxiety.  No memory loss.   OBJ General- Alert, Oriented, Affect-appropriate, Distress- none acute Skin- rash-none, lesions- none, excoriation- none Lymphadenopathy- none Head- atraumatic            Eyes- Gross vision intact, PERRLA, conjunctivae clear secretions            Ears- Hearing, canals-normal            Nose- Clear, no-Septal dev, mucus, polyps, erosion, perforation             Throat- Mallampati III , mucosa clear , drainage- none, tonsils- atrophic. Hesitant speech without stridor Neck- flexible , trachea midline, no stridor , thyroid nl, carotid no bruit Chest - symmetrical excursion , unlabored           Heart/CV- RRR , no murmur , no gallop  , no rub, nl s1 s2                           - JVD- none , edema- none,  stasis changes- none, varices- none           Lung- clear to P&A, wheeze- none, cough- none , dullness-none, rub- none           Chest wall- pacemaker on right Abd-  Br/ Gen/ Rectal- Not done, not indicated Extrem- cyanosis- none, clubbing, none, atrophy- none, strength- nl Neuro- grossly intact to observation

## 2012-11-20 NOTE — Patient Instructions (Signed)
Script sent for benzonatate perles  Sample Dymista nasal spray   1-2 puffs each nostril once daily at bedtime. We can prescribe a cheaper spray called Astelin for occasional use if you like this sample.

## 2012-11-23 ENCOUNTER — Ambulatory Visit (INDEPENDENT_AMBULATORY_CARE_PROVIDER_SITE_OTHER): Payer: Medicare Other

## 2012-11-23 DIAGNOSIS — J309 Allergic rhinitis, unspecified: Secondary | ICD-10-CM

## 2012-11-30 ENCOUNTER — Ambulatory Visit (INDEPENDENT_AMBULATORY_CARE_PROVIDER_SITE_OTHER): Payer: Medicare Other

## 2012-11-30 DIAGNOSIS — J309 Allergic rhinitis, unspecified: Secondary | ICD-10-CM

## 2012-12-05 ENCOUNTER — Telehealth: Payer: Self-pay

## 2012-12-05 MED ORDER — FUROSEMIDE 40 MG PO TABS: 40.0000 mg | ORAL_TABLET | Freq: Every day | ORAL | Status: DC

## 2012-12-05 NOTE — Assessment & Plan Note (Signed)
Reflux and post-nasal drip Continue reflux prcautions, try Dymista

## 2012-12-05 NOTE — Telephone Encounter (Signed)
Refilled

## 2012-12-05 NOTE — Assessment & Plan Note (Signed)
Continue allergy vaccine Plan- try Dymista

## 2012-12-07 ENCOUNTER — Ambulatory Visit (INDEPENDENT_AMBULATORY_CARE_PROVIDER_SITE_OTHER): Payer: Medicare Other

## 2012-12-07 DIAGNOSIS — J309 Allergic rhinitis, unspecified: Secondary | ICD-10-CM

## 2012-12-11 ENCOUNTER — Encounter: Payer: Self-pay | Admitting: Internal Medicine

## 2012-12-11 DIAGNOSIS — I4891 Unspecified atrial fibrillation: Secondary | ICD-10-CM

## 2012-12-14 ENCOUNTER — Ambulatory Visit (INDEPENDENT_AMBULATORY_CARE_PROVIDER_SITE_OTHER): Payer: Medicare Other

## 2012-12-14 DIAGNOSIS — J309 Allergic rhinitis, unspecified: Secondary | ICD-10-CM

## 2012-12-18 ENCOUNTER — Ambulatory Visit (INDEPENDENT_AMBULATORY_CARE_PROVIDER_SITE_OTHER): Payer: Medicare Other | Admitting: Interventional Cardiology

## 2012-12-18 ENCOUNTER — Ambulatory Visit (INDEPENDENT_AMBULATORY_CARE_PROVIDER_SITE_OTHER): Payer: Medicare Other | Admitting: *Deleted

## 2012-12-18 ENCOUNTER — Encounter: Payer: Self-pay | Admitting: Interventional Cardiology

## 2012-12-18 VITALS — BP 122/68 | HR 79 | Ht 63.0 in | Wt 150.0 lb

## 2012-12-18 DIAGNOSIS — I4891 Unspecified atrial fibrillation: Secondary | ICD-10-CM

## 2012-12-18 DIAGNOSIS — I6789 Other cerebrovascular disease: Secondary | ICD-10-CM

## 2012-12-18 DIAGNOSIS — I10 Essential (primary) hypertension: Secondary | ICD-10-CM

## 2012-12-18 DIAGNOSIS — Z950 Presence of cardiac pacemaker: Secondary | ICD-10-CM

## 2012-12-18 DIAGNOSIS — E785 Hyperlipidemia, unspecified: Secondary | ICD-10-CM

## 2012-12-18 LAB — POCT INR: INR: 3.3

## 2012-12-18 MED ORDER — FISH OIL 1000 MG PO CAPS: ORAL_CAPSULE | ORAL | Status: DC

## 2012-12-18 MED ORDER — WARFARIN SODIUM 2.5 MG PO TABS: 1.2500 mg | ORAL_TABLET | Freq: Every evening | ORAL | Status: DC

## 2012-12-18 NOTE — Patient Instructions (Addendum)
Your physician wants you to follow-up in: 6 Months with Dr. Eldridge Dace. You will receive a reminder letter in the mail two months in advance. If you don't receive a letter, please call our office to schedule the follow-up appointment.  Your physician has recommended you make the following change in your medication:   1. Start Fish Oil 1,000 MG 2 capsules by mouth twice a day.   2. Continue all other medications.   You will be set up for pacemaker check in March of 2015.

## 2012-12-18 NOTE — Progress Notes (Signed)
Patient ID: Debra Lynn, female   DOB: 08-Jun-1928, 79 y.o.   MRN: [id number]    [address] [place], [place]  [address] Phone: [phone] Fax:  [phone]  Date:  12/18/2012   ID:  Debra Lynn, DOB Jun 02, 1928, MRN [id number]  PCP:  Beverley Fiedler, MD      History of Present Illness: Debra Lynn is a 79 y.o. female with pacemaker and atrial fibrillation who had shortness of breath. BNP was mildly elevated. Lasix was started and she had some decrease in her shortness of breath. She still reports fatigue. She has issues with balance. She has fallen in the past. Atrial Fibrillation F/U:  Denies : Chest pain.  Leg edema.  Orthopnea.  Palpitations.  Syncope.     Wt Readings from Last 3 Encounters:  12/18/12 150 lb (68.04 kg)  11/20/12 152 lb 3.2 oz (69.037 kg)  08/23/12 154 lb 8.7 oz (70.1 kg)     Past Medical History  Diagnosis Date  . Sick sinus syndrome     with pacemaker and ablation  . Stroke 2010  . Glaucoma   . Seasonal allergic rhinitis     Pos Skin Test 07-05-07  . GERD (gastroesophageal reflux disease)   . Atrial fibrillation   . Hypertension   . Hyperlipidemia   . Cancer bREAST CA(12/2010)  . Chronic kidney disease TUMOR ON RIGHT KIDNEY-OBSERVING  . Arthritis KNEES &BACK  . Neuromuscular disorder SLIGHT TREMOR.  . Bleeding from breast     right    Current Outpatient Prescriptions  Medication Sig Dispense Refill  . acetaminophen (TYLENOL) 500 MG tablet Take 1,000 mg by mouth every 6 (six) hours as needed. For pain.      . benzonatate (TESSALON) 100 MG capsule Take 1 capsule (100 mg total) by mouth 3 (three) times daily as needed for cough.  200 capsule  3  . Calcium Carbonate-Vitamin D (CALCIUM-VITAMIN D) 500-200 MG-UNIT per tablet Take 1 tablet by mouth 2 (two) times daily with a meal.       . cephALEXin (KEFLEX) 250 MG capsule Take 1 capsule by mouth at bedtime.       . colestipol (COLESTID) 1 G tablet Take 1 g by mouth at bedtime.       . furosemide (LASIX) 40 MG tablet Take 1 tablet (40 mg total) by mouth daily.  30 tablet  2  . levETIRAcetam (KEPPRA) 500 MG tablet Take 250 mg by mouth every 12 (twelve) hours.      . lidocaine (LIDODERM) 5 % Place 1 patch onto the skin daily. Remove & Discard patch within 12 hours or as directed by MD  30 patch  0  . Multiple Vitamins-Minerals (CENTRUM SILVER) tablet Take 1 tablet by mouth daily.        . pantoprazole (PROTONIX) 40 MG tablet Take 1 tablet by mouth daily.      . potassium chloride SA (K-DUR,KLOR-CON) 20 MEQ tablet Take 1 tablet by mouth at bedtime.       . pravastatin (PRAVACHOL) 40 MG tablet Take 80 mg by mouth at bedtime.       . Probiotic Product (ALIGN) 4 MG CAPS Take 4 mg by mouth daily.      . propranolol (INDERAL) 60 MG tablet Take 60 mg by mouth. 1/2 tablet twice daily      . RESTASIS 0.05 % ophthalmic emulsion Place 1 drop into both eyes every 12 (twelve) hours.       Marland Kitchen  warfarin (COUMADIN) 2.5 MG tablet Take 1.25-2.5 mg by mouth every evening. Takes 2.5 mg on Mon Wed and Friday and a half tablet all other days      . ZOSTAVAX 62130 UNT/0.65ML injection        Current Facility-Administered Medications  Medication Dose Route Frequency Provider Last Rate Last Dose  . HYDROcodone-acetaminophen (NORCO) 5-325 MG per tablet 1-2 tablet  1-2 tablet Oral Q4H PRN Emelia Loron, MD        Allergies:   No Known Allergies  Social History:  The patient  reports that she has never smoked. She has never used smokeless tobacco. She reports that she does not drink alcohol or use illicit drugs.   Family History:  The patient's family history includes Asthma in her brother and daughter; Cancer in her father and another family member; Diabetes in her brother; Heart disease in an other family member; Heart failure in her mother; Hypertension in her brother; Lymphoma in her brother.   ROS:  Please see the history of present illness.  No nausea, vomiting.  No fevers, chills.  No  focal weakness.  No dysuria.   All other systems reviewed and negative.   PHYSICAL EXAM: VS:  BP 122/68  Pulse 79  Ht 5\' 3"  (1.6 m)  Wt 150 lb (68.04 kg)  BMI 26.58 kg/m2  SpO2 97% Well nourished, well developed, in no acute distress HEENT: normal Neck: no JVD, no carotid bruits Cardiac:  normal S1, S2; RRR;  Lungs:  clear to auscultation bilaterally, no wheezing, rhonchi or rales Abd: soft, nontender, no hepatomegaly Ext: no edema Skin: warm and dry Neuro:   no focal abnormalities noted    ASSESSMENT AND PLAN:  SOB  Continue Lasix Tablet, 40 MG, 1 tablet, Orally, Once a day Continue Klor-Con M20 Tablet Extended Release, 20 MEQ, 1 tablet, Orally, once a day Notes: BNP was increased. we switched diuretic to Lasix. SHOB has improved slightly. Consider increasing furosemide to 80 mg daily.    2. Atrial fibrillation  Continue Propranolol HCl Tablet, 60 MG, 1 tablet, Orally, twice a day Notes: Regular rhythm now. Continue Coumadin for now. If falling becomes more frequent, would have to reconsider anticoagulation for stroke prevention. She has had a prior stroke and would be a high risk for recurrent stroke.    3. Dizziness and giddiness  Notes: Blood pressures are stable, ranging in the 120-130 systolic. This may also be in part related to her parkinsonism. She will followup with neurology as well. d/ced losartan.    4. Hyperlipidemia: TG increased.  COntinue pravastatin.  Start fish oil, 2gr BID.  Freeze capsules.   Preventive Medicine  Adult topics discussed:  Diet: healthy diet.  Exercise: 5 days a week, at least 30 minutes of aerobic exercise.      Signed, Fredric Mare, MD, [facility] 12/18/2012 2:10 PM

## 2012-12-20 ENCOUNTER — Telehealth: Payer: Self-pay | Admitting: Interventional Cardiology

## 2012-12-20 NOTE — Telephone Encounter (Signed)
New message     Had coumadin checked this week---but daughter want to talk to [place] only. Question about medications.

## 2012-12-20 NOTE — Telephone Encounter (Signed)
She wanted to know about taking fish oil with coumadin.  Given elevated TG fish oil recommended.  Her INR was 3.3 this week.  Fish oil can elevate INR slightly. She will start fish oil 2 g/d around 10 days prior to next protime, and as long as INR isn't supratherapeutic at that time, we will increase to 4 g/d of fish oil.  Daughter shows verbal understanding.  Daughter also wanted to confirm that her mom was on 2.5 mg warfarin pill - I told her she was.

## 2012-12-21 ENCOUNTER — Ambulatory Visit (INDEPENDENT_AMBULATORY_CARE_PROVIDER_SITE_OTHER): Payer: Medicare Other

## 2012-12-21 DIAGNOSIS — J309 Allergic rhinitis, unspecified: Secondary | ICD-10-CM

## 2012-12-29 ENCOUNTER — Ambulatory Visit (INDEPENDENT_AMBULATORY_CARE_PROVIDER_SITE_OTHER): Payer: Medicare Other

## 2012-12-29 DIAGNOSIS — J309 Allergic rhinitis, unspecified: Secondary | ICD-10-CM

## 2013-01-02 ENCOUNTER — Other Ambulatory Visit: Payer: Self-pay | Admitting: Radiology

## 2013-01-04 ENCOUNTER — Ambulatory Visit (INDEPENDENT_AMBULATORY_CARE_PROVIDER_SITE_OTHER): Payer: Medicare Other

## 2013-01-04 DIAGNOSIS — J309 Allergic rhinitis, unspecified: Secondary | ICD-10-CM

## 2013-01-05 ENCOUNTER — Telehealth: Payer: Self-pay | Admitting: Interventional Cardiology

## 2013-01-05 NOTE — Telephone Encounter (Signed)
New Coumadin  Pt will have surgery Not sure when the appointment is will have to resch appointment.Debra KitchenMarland KitchenHowever, the plan is to wait until the appointment on 12/9 to see what the surgeon says.. Please call to assist

## 2013-01-05 NOTE — Telephone Encounter (Signed)
Lovenox bridge pre-surgery only.  Would like to avoid the bleeding risk postoperatively.

## 2013-01-05 NOTE — Telephone Encounter (Signed)
Patient daughter tells me that a cancerous lump was found on biopsy earlier this week right next to where her mastectomy occurred.  Patient sees Dr. Dwain Sarna next week to discuss surgical options.  Patient is on coumadin for h/o Afib and CVA.  Last CVA was 08/2012 and had therapeutic INR at that time.  Daughter will call me once more is known.  Are you okay with lovenox bridge pre- and post- surgery if it is needed?

## 2013-01-08 ENCOUNTER — Encounter (INDEPENDENT_AMBULATORY_CARE_PROVIDER_SITE_OTHER): Payer: Self-pay

## 2013-01-08 ENCOUNTER — Encounter (INDEPENDENT_AMBULATORY_CARE_PROVIDER_SITE_OTHER): Payer: Self-pay | Admitting: General Surgery

## 2013-01-08 ENCOUNTER — Ambulatory Visit (INDEPENDENT_AMBULATORY_CARE_PROVIDER_SITE_OTHER): Payer: Medicare Other | Admitting: General Surgery

## 2013-01-08 ENCOUNTER — Other Ambulatory Visit: Payer: Self-pay | Admitting: Oncology

## 2013-01-08 VITALS — BP 104/68 | HR 66 | Temp 98.2°F | Resp 16 | Ht 63.0 in | Wt 150.6 lb

## 2013-01-08 DIAGNOSIS — C50911 Malignant neoplasm of unspecified site of right female breast: Secondary | ICD-10-CM

## 2013-01-08 DIAGNOSIS — C50919 Malignant neoplasm of unspecified site of unspecified female breast: Secondary | ICD-10-CM

## 2013-01-08 NOTE — Telephone Encounter (Signed)
Daughter notified that pre-surgical lovenox only will be needed.  Patient sees Dr. Dwain Sarna later today and daughter will mention this to him as well.  Daughter will call me back once surgery date known.

## 2013-01-08 NOTE — Progress Notes (Signed)
Patient ID: Debra Lynn, female   DOB: 05/28/1928, 79 y.o.   MRN: [id number]  Chief Complaint  Patient presents with  . Mass    Evaluate new breast cancer    HPI Debra Lynn is a 79 y.o. female.   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 She declined any adjuvant therapy area and she did not undergo sentinel node biopsy due to her comorbidities and would not change her treatment. She has had an area that is growing at the medial aspect of her mastectomy incision. She has been evaluated by the breast center and there are some stable regional calcifications in the left breast that are being followed. These been stable for one year. The ultrasound was also done of the right chest wall mass that showed a round mass with a circumscribed margin. There is some increased vascularity with this as well. This underwent a biopsy of the ultrasound guidance. The pathology of this shows this to be an invasive ductal carcinoma. This is estrogen receptor positive at 100%. This is progesterone receptor negative. Proliferation markers 28%. HER-2/neu is not amplified. This appears to be a chest wall recurrence of her previous cancer. She since our last visit has had a stroke that she was hospitalized in July for but is otherwise really unchanged. She comes in today with her family to discuss her options. Past Medical History  Diagnosis Date  . Sick sinus syndrome     with pacemaker and ablation  . Stroke 2010  . Glaucoma   . Seasonal allergic rhinitis     Pos Skin Test 07-05-07  . GERD (gastroesophageal reflux disease)   . Atrial fibrillation   . Hypertension   . Hyperlipidemia   . Cancer bREAST CA(12/2010)  . Chronic kidney disease TUMOR ON RIGHT KIDNEY-OBSERVING  . Arthritis KNEES &BACK  . Neuromuscular disorder SLIGHT TREMOR.  .  Bleeding from breast     right    Past Surgical History  Procedure Laterality Date  . Cholecystectomy    . Vesicovaginal fistula closure w/ tah    . Atrial ablation surgery    . Flexible bronchoscopy w/ upper endoscopy      and swallowing evaluation  . Pacemaker insertion    . Knee arthroscopy    . Cataract extraction    . Insert / replace / remove pacemaker  01/2007    [facility]ED BY [facility]  . Abdominal hysterectomy  1975    pt. unsure if laparascopic/vaginal/abdominal  . Breast surgery  01/2011    mastectomy    Family History  Problem Relation Age of Onset  . Asthma Brother   . Asthma Daughter   . Heart disease    . Cancer    . Heart failure Mother   . Cancer Father     brain  . Lymphoma Brother   . Diabetes Brother   . Hypertension Brother     Social History History  Substance Use Topics  . Smoking status: Never Smoker   . Smokeless tobacco: Never Used  . Alcohol Use: No    No Known Allergies  Current Outpatient Prescriptions  Medication Sig Dispense Refill  . acetaminophen (TYLENOL) 500 MG tablet Take 1,000 mg by mouth every 6 (six) hours as needed. For pain.      . benzonatate (TESSALON) 100 MG capsule Take   1 capsule (100 mg total) by mouth 3 (three) times daily as needed for cough.  200 capsule  3  . Calcium Carbonate-Vitamin D (CALCIUM-VITAMIN D) 500-200 MG-UNIT per tablet Take 1 tablet by mouth 2 (two) times daily with a meal.       . cephALEXin (KEFLEX) 250 MG capsule Take 1 capsule by mouth at bedtime.       . colestipol (COLESTID) 1 G tablet Take 1 g by mouth at bedtime.      . furosemide (LASIX) 40 MG tablet Take 1 tablet (40 mg total) by mouth daily.  30 tablet  2  . levETIRAcetam (KEPPRA) 500 MG tablet Take 250 mg by mouth every 12 (twelve) hours.      . lidocaine (LIDODERM) 5 % Place 1 patch onto the skin daily. Remove & Discard patch within 12 hours or as directed by MD  30 patch  0  . Multiple Vitamins-Minerals (CENTRUM SILVER) tablet  Take 1 tablet by mouth daily.        . pantoprazole (PROTONIX) 40 MG tablet Take 1 tablet by mouth daily.      . potassium chloride SA (K-DUR,KLOR-CON) 20 MEQ tablet Take 1 tablet by mouth at bedtime.       . pravastatin (PRAVACHOL) 40 MG tablet Take 80 mg by mouth at bedtime.       . Probiotic Product (ALIGN) 4 MG CAPS Take 4 mg by mouth daily.      . propranolol (INDERAL) 60 MG tablet Take 60 mg by mouth. 1/2 tablet twice daily      . RESTASIS 0.05 % ophthalmic emulsion Place 1 drop into both eyes every 12 (twelve) hours.       . warfarin (COUMADIN) 2.5 MG tablet Take 0.5-1 tablets (1.25-2.5 mg total) by mouth every evening.  90 tablet  1   Current Facility-Administered Medications  Medication Dose Route Frequency Provider Last Rate Last Dose  . HYDROcodone-acetaminophen (NORCO) 5-325 MG per tablet 1-2 tablet  1-2 tablet Oral Q4H PRN Darivs Lunden, MD        Review of Systems Review of Systems  Constitutional: Negative for fever, chills and unexpected weight change.  HENT: Negative for congestion, hearing loss, sore throat, trouble swallowing and voice change.   Eyes: Negative for visual disturbance.  Respiratory: Negative for cough and wheezing.   Cardiovascular: Negative for chest pain, palpitations and leg swelling.  Gastrointestinal: Negative for nausea, vomiting, abdominal pain, diarrhea, constipation, blood in stool, abdominal distention and anal bleeding.  Genitourinary: Negative for hematuria, vaginal bleeding and difficulty urinating.  Musculoskeletal: Negative for arthralgias.  Skin: Negative for rash and wound.  Neurological: Negative for seizures, syncope and headaches.  Hematological: Negative for adenopathy. Does not bruise/bleed easily.  Psychiatric/Behavioral: Negative for confusion.    Blood pressure 104/68, pulse 66, temperature 98.2 F (36.8 C), temperature source Temporal, resp. rate 16, height 5' 3" (1.6 m), weight 150 lb 9.6 oz (68.312 kg).  Physical  Exam Physical Exam  Vitals reviewed. Constitutional: She appears well-developed and well-nourished.  Cardiovascular: Normal rate and normal heart sounds.   Pulmonary/Chest: Effort normal and breath sounds normal. Left breast exhibits no inverted nipple, no mass, no nipple discharge, no skin change and no tenderness.    Lymphadenopathy:    She has no cervical adenopathy.    She has no axillary adenopathy.       Right: No supraclavicular adenopathy present.       Left: No supraclavicular adenopathy present.    Data Reviewed   Us and path reviewed  Assessment    Chest wall recurrence s/p mastectomy     Plan    Right chest wall mass excision  I discussed right chest wall mass excision.  This appears small and amenable to local excision. I will discuss with Dr Magrinat any further evaluation as well as adding adjuvant therapy at this time although she may decline this again.  Will also discuss with her cardiologist Dr Varanasi about coumadin and let her neurologist know as well due to recent stroke.  There are certainly some risks in operating on her including local that are surgical as well as others like another stroke.          Eudelia Hiltunen 01/08/2013, 3:50 PM    

## 2013-01-09 ENCOUNTER — Encounter: Payer: Self-pay | Admitting: Oncology

## 2013-01-09 ENCOUNTER — Other Ambulatory Visit: Payer: Self-pay | Admitting: Oncology

## 2013-01-09 ENCOUNTER — Telehealth (INDEPENDENT_AMBULATORY_CARE_PROVIDER_SITE_OTHER): Payer: Self-pay

## 2013-01-09 DIAGNOSIS — C50919 Malignant neoplasm of unspecified site of unspecified female breast: Secondary | ICD-10-CM

## 2013-01-09 NOTE — Telephone Encounter (Signed)
Daughter told us they met with Dr. Dwain Sarna and he wants to do the surgery next week, but won't schedule it until he gets the clearance from Dr. Eldridge Dace about stopping coumadin 5 days prior / using lovenox pre-op.  His nurse Helmut Muster has apparently faxed this over.  Please have Dr. Eldridge Dace sign this and fax it back when this comes in. Thanks.

## 2013-01-09 NOTE — Telephone Encounter (Signed)
Called pt's daughter to notify her that Dr Dwain Sarna did speak with Dr Darnelle Catalan which they agree for the pt to be seen by Dr Darnelle Catalan after surgery. I advised the daughter today that as soon as we get the clearance on the pt we will work on getting her scheduled for surgery. I will be in touch with the daughter. The daughter understands.

## 2013-01-09 NOTE — Telephone Encounter (Signed)
Faxed Clearance to [facility].

## 2013-01-09 NOTE — Telephone Encounter (Signed)
Message copied by Ethlyn Gallery on Tue Jan 09, 2013  2:22 PM ------      Message from: Highland, MATTHEW      Created: Mon Jan 08, 2013  9:10 PM       Gus will see after surgery and talk to them      ----- Message -----         From: Lowella Dell, MD         Sent: 01/08/2013   9:05 PM           To: Emelia Loron, MD            It would be reasonable to take care of the local disease then start her on anastrozole--I'll make sure she sees me post op            G      ----- Message -----         From: Emelia Loron, MD         Sent: 01/08/2013   7:12 PM           To: Lowella Dell, MD            Gus,      She may be getting presented this Wednesday.  This is an elderly woman with quite a few medical problems. I did a mastectomy previously for er+ pr- her 2 - 2.1 cm idc.  She didn't get adjuvant therapy.  She has medial cw recurrence that I think can get excised.  On coumadin, recent stroke etc.  Just wanted to see if you thought a met workup is indicated.  I think even if positive this should get removed as it will cause her trouble locally.      Thanks,      [employer]             ------

## 2013-01-10 ENCOUNTER — Telehealth: Payer: Self-pay | Admitting: Oncology

## 2013-01-10 NOTE — Telephone Encounter (Signed)
, °

## 2013-01-11 ENCOUNTER — Ambulatory Visit (INDEPENDENT_AMBULATORY_CARE_PROVIDER_SITE_OTHER): Payer: Medicare Other

## 2013-01-11 DIAGNOSIS — J309 Allergic rhinitis, unspecified: Secondary | ICD-10-CM

## 2013-01-11 NOTE — Telephone Encounter (Signed)
LM checking on the status of the clearance request that I faxed on 12/9. I just want to know if there are any recommendations on this pt before breast cancer surgery. They will send another note to Dr Thad Ranger. They did receive my clearance request from the other day.

## 2013-01-11 NOTE — Telephone Encounter (Signed)
Patient's daughter called back asking to speak with [name], who is with patient's at this time.  She states that the cardiologist is asking her questions regarding when the surgery will be and so on so they can make some decisions and send the clearance.  Explained that I will send a message to [facility] to make her aware.

## 2013-01-11 NOTE — Telephone Encounter (Signed)
Called pt's daughter to notify her that we did receive clearance from Dr Hoyle Barr office. I advised her that the pt may stop her Coumadin 5 days before surgery and there will be a Lovenox bridge involved with surgery. I advised the daughter that she will need to contact Riki Rusk at [facility] once she gets the surgery date. I advised the daughter that we have not heard back from Dr Thad Ranger so I called their office again to remind them to send [place] some info. I will send Dr Dwain Sarna a message about getting surgical orders completed. The daughter wants a sx date ASAP.

## 2013-01-12 ENCOUNTER — Ambulatory Visit (INDEPENDENT_AMBULATORY_CARE_PROVIDER_SITE_OTHER): Payer: Medicare Other | Admitting: Pharmacist

## 2013-01-12 ENCOUNTER — Other Ambulatory Visit (INDEPENDENT_AMBULATORY_CARE_PROVIDER_SITE_OTHER): Payer: Self-pay | Admitting: General Surgery

## 2013-01-12 DIAGNOSIS — I4891 Unspecified atrial fibrillation: Secondary | ICD-10-CM

## 2013-01-12 DIAGNOSIS — I6789 Other cerebrovascular disease: Secondary | ICD-10-CM

## 2013-01-12 LAB — POCT INR: INR: 3.1

## 2013-01-12 MED ORDER — ENOXAPARIN SODIUM 80 MG/0.8ML ~~LOC~~ SOLN: 70.0000 mg | Freq: Two times a day (BID) | SUBCUTANEOUS | Status: DC

## 2013-01-12 NOTE — Patient Instructions (Signed)
01/12/13 - warfarin 1.25 mg x 1 01/13/13 - warfarin 1.25 mg x 1  01/14/13 - no warfarin, no lovenox 01/15/13 - no warfarin, and take lovenox 70 mg on 9 am and 9 pm 01/16/13 - no warfarin, and take lovenox 70 mg on 9 am and 9 pm 01/17/13 - no warfarin, and take lovenox 70 mg on 9 am and 9 pm 01/18/13 - no warfarin, and take lovenox 70 mg on 9 am only.  No lovenox PM. 01/19/13 - Restart warfarin 2.5 mg daily that evening after procedure. 01/23/13 - recheck INR at 2:00 at [facility] with [facility].

## 2013-01-12 NOTE — Telephone Encounter (Signed)
Debra Lynn called to let me know surgery will be next Friday.  Patient is to come in to see me today to set up lovenox bridge for pre-op only.  Will restart coumadin post-op by itself per Dr. Eldridge Dace.

## 2013-01-15 ENCOUNTER — Encounter (HOSPITAL_COMMUNITY): Payer: Self-pay

## 2013-01-15 ENCOUNTER — Ambulatory Visit (HOSPITAL_COMMUNITY)
Admission: RE | Admit: 2013-01-15 | Discharge: 2013-01-15 | Disposition: A | Discharge status: Home / Self Care | Payer: Medicare Other | Source: Ambulatory Visit | Attending: General Surgery | Admitting: General Surgery

## 2013-01-15 ENCOUNTER — Ambulatory Visit: Payer: Medicare Other

## 2013-01-15 ENCOUNTER — Encounter (HOSPITAL_COMMUNITY)
Admission: RE | Admit: 2013-01-15 | Discharge: 2013-01-15 | Disposition: A | Discharge status: Home / Self Care | Payer: Medicare Other | Source: Ambulatory Visit | Attending: General Surgery | Admitting: General Surgery

## 2013-01-15 ENCOUNTER — Encounter: Payer: Self-pay | Admitting: Oncology

## 2013-01-15 ENCOUNTER — Encounter (HOSPITAL_COMMUNITY): Payer: Self-pay | Admitting: Pharmacy Technician

## 2013-01-15 DIAGNOSIS — M47814 Spondylosis without myelopathy or radiculopathy, thoracic region: Secondary | ICD-10-CM | POA: Insufficient documentation

## 2013-01-15 DIAGNOSIS — Z01810 Encounter for preprocedural cardiovascular examination: Secondary | ICD-10-CM | POA: Insufficient documentation

## 2013-01-15 DIAGNOSIS — Z01818 Encounter for other preprocedural examination: Secondary | ICD-10-CM | POA: Insufficient documentation

## 2013-01-15 DIAGNOSIS — Z01812 Encounter for preprocedural laboratory examination: Secondary | ICD-10-CM | POA: Insufficient documentation

## 2013-01-15 DIAGNOSIS — R222 Localized swelling, mass and lump, trunk: Secondary | ICD-10-CM | POA: Insufficient documentation

## 2013-01-15 HISTORY — DX: Parkinsonism, unspecified: G20.C

## 2013-01-15 HISTORY — DX: Orthostatic hypotension: I95.1

## 2013-01-15 HISTORY — DX: Reserved for concepts with insufficient information to code with codable children: IMO0002

## 2013-01-15 HISTORY — DX: Unspecified convulsions: R56.9

## 2013-01-15 HISTORY — DX: Disease of pericardium, unspecified: I31.9

## 2013-01-15 HISTORY — DX: Parkinson's disease: G20

## 2013-01-15 LAB — CBC WITH DIFFERENTIAL/PLATELET
Basophils Absolute: 0 10*3/uL (ref 0.0–0.1)
Basophils Relative: 1 % (ref 0–1)
Eosinophils Absolute: 0.4 10*3/uL (ref 0.0–0.7)
Eosinophils Relative: 6 % — ABNORMAL HIGH (ref 0–5)
HCT: 37.6 % (ref 36.0–46.0)
Hemoglobin: 12.6 g/dL (ref 12.0–15.0)
Lymphocytes Relative: 27 % (ref 12–46)
Lymphs Abs: 1.8 10*3/uL (ref 0.7–4.0)
MCH: 29.2 pg (ref 26.0–34.0)
MCHC: 33.5 g/dL (ref 30.0–36.0)
MCV: 87 fL (ref 78.0–100.0)
Monocytes Absolute: 0.5 10*3/uL (ref 0.1–1.0)
Monocytes Relative: 8 % (ref 3–12)
Neutro Abs: 4 10*3/uL (ref 1.7–7.7)
Neutrophils Relative %: 59 % (ref 43–77)
Platelets: 165 10*3/uL (ref 150–400)
RBC: 4.32 MIL/uL (ref 3.87–5.11)
RDW: 13.7 % (ref 11.5–15.5)
WBC: 6.7 10*3/uL (ref 4.0–10.5)

## 2013-01-15 LAB — BASIC METABOLIC PANEL
BUN: 16 mg/dL (ref 6–23)
CO2: 27 mEq/L (ref 19–32)
Calcium: 10 mg/dL (ref 8.4–10.5)
Chloride: 102 mEq/L (ref 96–112)
Creatinine, Ser: 0.91 mg/dL (ref 0.50–1.10)
GFR calc Af Amer: 66 mL/min — ABNORMAL LOW (ref >90)
GFR calc non Af Amer: 57 mL/min — ABNORMAL LOW (ref >90)
Glucose, Bld: 99 mg/dL (ref 70–99)
Potassium: 4.1 mEq/L (ref 3.5–5.1)
Sodium: 140 mEq/L (ref 135–145)

## 2013-01-15 LAB — PROTIME-INR
INR: 2.09 — ABNORMAL HIGH (ref 0.00–1.49)
Prothrombin Time: 22.8 seconds — ABNORMAL HIGH (ref 11.6–15.2)

## 2013-01-15 NOTE — Patient Instructions (Signed)
20 Debra Lynn  01/15/2013   Your procedure is scheduled on: 01/19/13  Report to [facility] at 9:00 AM.  Call this number if you have problems the morning of surgery [phone]   Remember:   Do not eat food or drink liquids After Midnight.     Take these medicines the morning of surgery with A SIP OF WATER: propranolol, protonix, keppra, Restasis eye drops   Do not wear jewelry, make-up or nail polish.  Do not wear lotions, powders, or perfumes. You may wear deodorant.  Do not shave 48 hours prior to surgery. Men may shave face and neck.  Do not bring valuables to the hospital.  Contacts, dentures or bridgework may not be worn into surgery.  Leave suitcase in the car. After surgery it may be brought to your room.  For patients admitted to the hospital, checkout time is 11:00 AM the day of discharge.   Patients discharged the day of surgery will not be allowed to drive home.  Name and phone number of your driver: Debra Lynn [phone]  Birdie Sons, RN  pre op nurse call if needed [phone]    FAILURE TO FOLLOW THESE INSTRUCTIONS MAY RESULT IN CANCELLATION OF YOUR SURGERY   Patient Signature: ___________________________________________

## 2013-01-15 NOTE — Progress Notes (Signed)
Pt states that she is to spend one night postop. She will be starting coumadin again and was told by doctor that it would take at least one night in hospital. Currently pt is listed as ambulatory. Please address.

## 2013-01-16 ENCOUNTER — Telehealth (INDEPENDENT_AMBULATORY_CARE_PROVIDER_SITE_OTHER): Payer: Self-pay | Admitting: *Deleted

## 2013-01-16 ENCOUNTER — Ambulatory Visit (INDEPENDENT_AMBULATORY_CARE_PROVIDER_SITE_OTHER): Payer: Medicare Other | Admitting: *Deleted

## 2013-01-16 DIAGNOSIS — I4891 Unspecified atrial fibrillation: Secondary | ICD-10-CM

## 2013-01-16 LAB — MDC_IDC_ENUM_SESS_TYPE_INCLINIC
Battery Impedance: 2500 Ohm
Battery Voltage: 2.73 V
Brady Statistic RA Percent Paced: 64 %
Brady Statistic RV Percent Paced: 99 %
Date Time Interrogation Session: 20141216153019
Implantable Pulse Generator Model: 5586
Implantable Pulse Generator Serial Number: 1908386
Lead Channel Impedance Value: 1126 Ohm
Lead Channel Impedance Value: 586 Ohm
Lead Channel Impedance Value: 975 Ohm
Lead Channel Pacing Threshold Amplitude: 1 V
Lead Channel Pacing Threshold Amplitude: 1.25 V
Lead Channel Pacing Threshold Amplitude: 2.5 V
Lead Channel Pacing Threshold Pulse Width: 0.5 ms
Lead Channel Pacing Threshold Pulse Width: 0.7 ms
Lead Channel Pacing Threshold Pulse Width: 0.8 ms
Lead Channel Sensing Intrinsic Amplitude: 1.75 mV
Lead Channel Sensing Intrinsic Amplitude: 6 mV
Lead Channel Setting Pacing Amplitude: 2 V
Lead Channel Setting Pacing Amplitude: 2.5 V
Lead Channel Setting Pacing Amplitude: 3 V
Lead Channel Setting Pacing Pulse Width: 0.8 ms
Lead Channel Setting Sensing Sensitivity: 4 mV
Lead Channel Setting Sensing Sensitivity: 4 mV

## 2013-01-16 NOTE — Telephone Encounter (Signed)
Daughter called to report that patient has a "blood blister" where Dr. Isabell Jarvis did the biopsy.  She states that the area is hard and tender to touch.  Daughter denies any drainage or fever.  Daughter just wanted to make sure Dr. Dwain Sarna was aware of this and wondered if patient should be seen for this prior to her surgery on Friday 01/19/13.  Explained that I would send a message to Dr. Dwain Sarna and [name] to get their opinion then we will give her a call back to let her know.  Debra Lynn states understanding and agreeable at this time.

## 2013-01-16 NOTE — Progress Notes (Signed)
CRT-P device check in clinic. Normal device function. Thresholds, sensing, impedance consistent with previous measurements. Histograms appropriate for patient and level of activity. No mode switches or ventricular high rate episodes. Patient bi-ventricularly pacing 99% of the time. Device programmed with appropriate safety margins. Device heart failure diagnostics are within normal limits and stable over time. Estimated longevity 2.00 to 2.25 years. ROV in 3 mths w/JA.

## 2013-01-16 NOTE — Telephone Encounter (Signed)
No that should be fine.  Shouldn't change our plan for Friday.

## 2013-01-17 NOTE — Telephone Encounter (Signed)
Called pt's daughter to notify her no change in the plan for Friday's surgery.

## 2013-01-18 ENCOUNTER — Encounter (INDEPENDENT_AMBULATORY_CARE_PROVIDER_SITE_OTHER): Payer: Medicare Other | Admitting: General Surgery

## 2013-01-18 ENCOUNTER — Ambulatory Visit (INDEPENDENT_AMBULATORY_CARE_PROVIDER_SITE_OTHER): Payer: Medicare Other

## 2013-01-18 DIAGNOSIS — J309 Allergic rhinitis, unspecified: Secondary | ICD-10-CM

## 2013-01-19 ENCOUNTER — Encounter (HOSPITAL_COMMUNITY): Payer: Self-pay | Admitting: *Deleted

## 2013-01-19 ENCOUNTER — Observation Stay (HOSPITAL_COMMUNITY)
Admission: RE | Admit: 2013-01-19 | Discharge: 2013-01-20 | Disposition: A | Discharge status: Home / Self Care | Payer: Medicare Other | Source: Ambulatory Visit | Attending: General Surgery | Admitting: General Surgery

## 2013-01-19 ENCOUNTER — Encounter (HOSPITAL_COMMUNITY): Payer: Medicare Other | Admitting: Anesthesiology

## 2013-01-19 ENCOUNTER — Encounter (HOSPITAL_COMMUNITY)
Admission: RE | Disposition: A | Discharge status: Home / Self Care | Payer: Self-pay | Source: Ambulatory Visit | Attending: General Surgery

## 2013-01-19 ENCOUNTER — Ambulatory Visit (HOSPITAL_COMMUNITY): Payer: Medicare Other | Admitting: Anesthesiology

## 2013-01-19 DIAGNOSIS — C50211 Malignant neoplasm of upper-inner quadrant of right female breast: Secondary | ICD-10-CM | POA: Diagnosis present

## 2013-01-19 DIAGNOSIS — G250 Essential tremor: Secondary | ICD-10-CM | POA: Insufficient documentation

## 2013-01-19 DIAGNOSIS — C50919 Malignant neoplasm of unspecified site of unspecified female breast: Principal | ICD-10-CM | POA: Insufficient documentation

## 2013-01-19 DIAGNOSIS — C50219 Malignant neoplasm of upper-inner quadrant of unspecified female breast: Secondary | ICD-10-CM | POA: Insufficient documentation

## 2013-01-19 DIAGNOSIS — G252 Other specified forms of tremor: Secondary | ICD-10-CM | POA: Diagnosis present

## 2013-01-19 DIAGNOSIS — Z90710 Acquired absence of both cervix and uterus: Secondary | ICD-10-CM | POA: Insufficient documentation

## 2013-01-19 DIAGNOSIS — K219 Gastro-esophageal reflux disease without esophagitis: Secondary | ICD-10-CM | POA: Insufficient documentation

## 2013-01-19 DIAGNOSIS — R569 Unspecified convulsions: Secondary | ICD-10-CM | POA: Insufficient documentation

## 2013-01-19 DIAGNOSIS — C50911 Malignant neoplasm of unspecified site of right female breast: Secondary | ICD-10-CM

## 2013-01-19 DIAGNOSIS — E785 Hyperlipidemia, unspecified: Secondary | ICD-10-CM | POA: Insufficient documentation

## 2013-01-19 DIAGNOSIS — Z170 Estrogen receptor positive status [ER+]: Secondary | ICD-10-CM | POA: Insufficient documentation

## 2013-01-19 DIAGNOSIS — G20A1 Parkinson's disease without dyskinesia, without mention of fluctuations: Secondary | ICD-10-CM | POA: Insufficient documentation

## 2013-01-19 DIAGNOSIS — I495 Sick sinus syndrome: Secondary | ICD-10-CM | POA: Insufficient documentation

## 2013-01-19 DIAGNOSIS — I10 Essential (primary) hypertension: Secondary | ICD-10-CM | POA: Insufficient documentation

## 2013-01-19 DIAGNOSIS — G20 Parkinson's disease: Secondary | ICD-10-CM | POA: Diagnosis present

## 2013-01-19 DIAGNOSIS — I4891 Unspecified atrial fibrillation: Secondary | ICD-10-CM | POA: Insufficient documentation

## 2013-01-19 DIAGNOSIS — R110 Nausea: Secondary | ICD-10-CM | POA: Insufficient documentation

## 2013-01-19 DIAGNOSIS — Z8673 Personal history of transient ischemic attack (TIA), and cerebral infarction without residual deficits: Secondary | ICD-10-CM | POA: Insufficient documentation

## 2013-01-19 DIAGNOSIS — Z950 Presence of cardiac pacemaker: Secondary | ICD-10-CM | POA: Insufficient documentation

## 2013-01-19 HISTORY — PX: MASS EXCISION: SHX2000

## 2013-01-19 HISTORY — DX: Cerebral infarction due to unspecified occlusion or stenosis of unspecified cerebral artery: I63.50

## 2013-01-19 LAB — PROTIME-INR
INR: 1.08 (ref 0.00–1.49)
Prothrombin Time: 13.8 seconds (ref 11.6–15.2)

## 2013-01-19 SURGERY — EXCISION MASS
Anesthesia: General | Site: Chest | Laterality: Right

## 2013-01-19 MED ORDER — BUPIVACAINE HCL (PF) 0.25 % IJ SOLN
INTRAMUSCULAR | Status: AC
  Filled 2013-01-19: qty 30

## 2013-01-19 MED ORDER — PANTOPRAZOLE SODIUM 40 MG PO TBEC
40.0000 mg | DELAYED_RELEASE_TABLET | Freq: Every day | ORAL | Status: DC
  Administered 2013-01-20: 40 mg via ORAL
  Filled 2013-01-19: qty 1

## 2013-01-19 MED ORDER — LIDOCAINE HCL (CARDIAC) 20 MG/ML IV SOLN
INTRAVENOUS | Status: AC
  Filled 2013-01-19: qty 5

## 2013-01-19 MED ORDER — BUPIVACAINE HCL (PF) 0.25 % IJ SOLN
INTRAMUSCULAR | Status: DC | PRN
  Administered 2013-01-19: 10 mL

## 2013-01-19 MED ORDER — FENTANYL CITRATE 0.05 MG/ML IJ SOLN
25.0000 ug | INTRAMUSCULAR | Status: DC | PRN
  Administered 2013-01-19 (×2): 50 ug via INTRAVENOUS

## 2013-01-19 MED ORDER — ENOXAPARIN SODIUM 80 MG/0.8ML ~~LOC~~ SOLN: 70.0000 mg | Freq: Two times a day (BID) | SUBCUTANEOUS | Status: DC

## 2013-01-19 MED ORDER — WARFARIN SODIUM 2.5 MG PO TABS
2.5000 mg | ORAL_TABLET | Freq: Every day | ORAL | Status: DC
  Administered 2013-01-20: 2.5 mg via ORAL
  Filled 2013-01-19: qty 1

## 2013-01-19 MED ORDER — POTASSIUM CHLORIDE CRYS ER 20 MEQ PO TBCR
20.0000 meq | EXTENDED_RELEASE_TABLET | Freq: Every day | ORAL | Status: DC
  Administered 2013-01-19: 20 meq via ORAL
  Filled 2013-01-19 (×2): qty 1

## 2013-01-19 MED ORDER — WARFARIN 1.25 MG HALF TABLET: 1.2500 mg | ORAL_TABLET | ORAL | Status: DC

## 2013-01-19 MED ORDER — FENTANYL CITRATE 0.05 MG/ML IJ SOLN
INTRAMUSCULAR | Status: AC
  Filled 2013-01-19: qty 2

## 2013-01-19 MED ORDER — BENZONATATE 100 MG PO CAPS
100.0000 mg | ORAL_CAPSULE | Freq: Three times a day (TID) | ORAL | Status: DC | PRN
  Filled 2013-01-19: qty 1

## 2013-01-19 MED ORDER — FUROSEMIDE 40 MG PO TABS
40.0000 mg | ORAL_TABLET | Freq: Every morning | ORAL | Status: DC
  Administered 2013-01-20: 40 mg via ORAL
  Filled 2013-01-19 (×2): qty 1

## 2013-01-19 MED ORDER — ONDANSETRON HCL 4 MG/2ML IJ SOLN
INTRAMUSCULAR | Status: DC | PRN
  Administered 2013-01-19: 2 mg via INTRAVENOUS

## 2013-01-19 MED ORDER — SODIUM CHLORIDE 0.9 % IV SOLN
INTRAVENOUS | Status: DC
  Administered 2013-01-19: 16:00:00 via INTRAVENOUS

## 2013-01-19 MED ORDER — MORPHINE SULFATE 2 MG/ML IJ SOLN
1.0000 mg | INTRAMUSCULAR | Status: DC | PRN
  Administered 2013-01-19 – 2013-01-20 (×2): 1 mg via INTRAVENOUS
  Filled 2013-01-19 (×2): qty 1

## 2013-01-19 MED ORDER — BACITRACIN ZINC 500 UNIT/GM EX OINT
TOPICAL_OINTMENT | CUTANEOUS | Status: DC | PRN
  Administered 2013-01-19: 1 via TOPICAL

## 2013-01-19 MED ORDER — HYDROCODONE-ACETAMINOPHEN 5-325 MG PO TABS
1.0000 | ORAL_TABLET | ORAL | Status: DC | PRN
  Administered 2013-01-19: 2 via ORAL
  Administered 2013-01-19: 1 via ORAL
  Administered 2013-01-20: 2 via ORAL
  Administered 2013-01-20: 1 via ORAL
  Filled 2013-01-19: qty 2
  Filled 2013-01-19 (×2): qty 1
  Filled 2013-01-19: qty 2

## 2013-01-19 MED ORDER — BACITRACIN ZINC 500 UNIT/GM EX OINT
TOPICAL_OINTMENT | CUTANEOUS | Status: AC
  Filled 2013-01-19: qty 28.35

## 2013-01-19 MED ORDER — LACTATED RINGERS IV SOLN: INTRAVENOUS | Status: DC

## 2013-01-19 MED ORDER — LACTATED RINGERS IV SOLN
INTRAVENOUS | Status: DC | PRN
  Administered 2013-01-19: 11:00:00 via INTRAVENOUS

## 2013-01-19 MED ORDER — LIDOCAINE HCL (CARDIAC) 20 MG/ML IV SOLN
INTRAVENOUS | Status: DC | PRN
  Administered 2013-01-19: 30 mg via INTRAVENOUS

## 2013-01-19 MED ORDER — CEFAZOLIN SODIUM-DEXTROSE 2-3 GM-% IV SOLR
INTRAVENOUS | Status: AC
  Filled 2013-01-19: qty 50

## 2013-01-19 MED ORDER — CYCLOSPORINE 0.05 % OP EMUL
1.0000 [drp] | Freq: Two times a day (BID) | OPHTHALMIC | Status: DC
  Administered 2013-01-19 – 2013-01-20 (×2): 1 [drp] via OPHTHALMIC
  Filled 2013-01-19 (×3): qty 1

## 2013-01-19 MED ORDER — PROPOFOL 10 MG/ML IV BOLUS
INTRAVENOUS | Status: AC
  Filled 2013-01-19: qty 20

## 2013-01-19 MED ORDER — MORPHINE SULFATE 10 MG/ML IJ SOLN
1.0000 mg | INTRAMUSCULAR | Status: DC | PRN
  Administered 2013-01-19: 1 mg via INTRAVENOUS

## 2013-01-19 MED ORDER — MORPHINE SULFATE 2 MG/ML IJ SOLN
INTRAMUSCULAR | Status: AC
  Filled 2013-01-19: qty 1

## 2013-01-19 MED ORDER — PROPRANOLOL HCL 20 MG PO TABS
30.0000 mg | ORAL_TABLET | Freq: Two times a day (BID) | ORAL | Status: DC
  Administered 2013-01-19 – 2013-01-20 (×2): 30 mg via ORAL
  Filled 2013-01-19 (×3): qty 1

## 2013-01-19 MED ORDER — KETAMINE HCL 50 MG/ML IJ SOLN
INTRAMUSCULAR | Status: AC
  Filled 2013-01-19: qty 10

## 2013-01-19 MED ORDER — ONDANSETRON HCL 4 MG/2ML IJ SOLN
4.0000 mg | Freq: Four times a day (QID) | INTRAMUSCULAR | Status: DC | PRN
  Administered 2013-01-20: 4 mg via INTRAVENOUS
  Filled 2013-01-19: qty 2

## 2013-01-19 MED ORDER — SUCCINYLCHOLINE CHLORIDE 20 MG/ML IJ SOLN
INTRAMUSCULAR | Status: AC
  Filled 2013-01-19: qty 1

## 2013-01-19 MED ORDER — CEFAZOLIN SODIUM 1-5 GM-% IV SOLN
1.0000 g | Freq: Three times a day (TID) | INTRAVENOUS | Status: AC
  Administered 2013-01-19 – 2013-01-20 (×2): 1 g via INTRAVENOUS
  Filled 2013-01-19 (×2): qty 50

## 2013-01-19 MED ORDER — WARFARIN SODIUM 2.5 MG PO TABS
2.5000 mg | ORAL_TABLET | ORAL | Status: AC
  Administered 2013-01-19: 2.5 mg via ORAL
  Filled 2013-01-19: qty 1

## 2013-01-19 MED ORDER — LACTATED RINGERS IV SOLN
INTRAVENOUS | Status: DC
  Administered 2013-01-19: 1000 mL via INTRAVENOUS

## 2013-01-19 MED ORDER — WARFARIN 1.25 MG HALF TABLET: 1.2500 mg | ORAL_TABLET | Freq: Every day | ORAL | Status: DC

## 2013-01-19 MED ORDER — CEFAZOLIN SODIUM-DEXTROSE 2-3 GM-% IV SOLR
2.0000 g | INTRAVENOUS | Status: AC
  Administered 2013-01-19: 2 g via INTRAVENOUS

## 2013-01-19 MED ORDER — FENTANYL CITRATE 0.05 MG/ML IJ SOLN
INTRAMUSCULAR | Status: DC | PRN
  Administered 2013-01-19: 25 ug via INTRAVENOUS
  Administered 2013-01-19: 62.5 ug via INTRAVENOUS
  Administered 2013-01-19: 12.5 ug via INTRAVENOUS

## 2013-01-19 MED ORDER — WARFARIN - PHYSICIAN DOSING INPATIENT: Freq: Every day | Status: DC

## 2013-01-19 MED ORDER — LEVETIRACETAM 250 MG PO TABS
250.0000 mg | ORAL_TABLET | Freq: Two times a day (BID) | ORAL | Status: DC
  Administered 2013-01-19 – 2013-01-20 (×2): 250 mg via ORAL
  Filled 2013-01-19 (×3): qty 1

## 2013-01-19 MED ORDER — COLESTIPOL HCL 1 G PO TABS
1.0000 g | ORAL_TABLET | Freq: Every day | ORAL | Status: DC
  Administered 2013-01-19: 1 g via ORAL
  Filled 2013-01-19 (×2): qty 1

## 2013-01-19 MED ORDER — PROPOFOL 10 MG/ML IV BOLUS
INTRAVENOUS | Status: DC | PRN
  Administered 2013-01-19: 150 mg via INTRAVENOUS
  Administered 2013-01-19 (×2): 10 mg via INTRAVENOUS

## 2013-01-19 SURGICAL SUPPLY — 33 items
BINDER BREAST LRG (GAUZE/BANDAGES/DRESSINGS) ×2 IMPLANT
BLADE SURG 15 STRL LF DISP TIS (BLADE) ×1 IMPLANT
BLADE SURG 15 STRL SS (BLADE) ×1
BLADE SURG ROTATE 9660 (MISCELLANEOUS) IMPLANT
CANISTER SUCTION 1200CC (MISCELLANEOUS) IMPLANT
CHLORAPREP W/TINT 26ML (MISCELLANEOUS) ×2 IMPLANT
DECANTER SPIKE VIAL GLASS SM (MISCELLANEOUS) ×2 IMPLANT
DERMABOND ADVANCED (GAUZE/BANDAGES/DRESSINGS) ×1
DERMABOND ADVANCED .7 DNX12 (GAUZE/BANDAGES/DRESSINGS) ×1 IMPLANT
DRAPE PED LAPAROTOMY (DRAPES) ×2 IMPLANT
ELECT REM PT RETURN 9FT ADLT (ELECTROSURGICAL) ×2
ELECTRODE REM PT RTRN 9FT ADLT (ELECTROSURGICAL) ×1 IMPLANT
GLOVE BIO SURGEON STRL SZ7 (GLOVE) ×8 IMPLANT
GLOVE BIOGEL PI IND STRL 7.5 (GLOVE) ×1 IMPLANT
GLOVE BIOGEL PI INDICATOR 7.5 (GLOVE) ×1
GOWN PREVENTION PLUS XLARGE (GOWN DISPOSABLE) ×4 IMPLANT
KIT BASIN OR (CUSTOM PROCEDURE TRAY) ×2 IMPLANT
KIT MARKER MARGIN INK (KITS) ×2 IMPLANT
MANIFOLD NEPTUNE II (INSTRUMENTS) ×2 IMPLANT
NEEDLE HYPO 25X1 1.5 SAFETY (NEEDLE) ×2 IMPLANT
NS IRRIG 1000ML POUR BTL (IV SOLUTION) ×2 IMPLANT
PACK BASIC VI WITH GOWN DISP (CUSTOM PROCEDURE TRAY) ×2 IMPLANT
PENCIL BUTTON HOLSTER BLD 10FT (ELECTRODE) ×2 IMPLANT
SPONGE GAUZE 4X4 12PLY (GAUZE/BANDAGES/DRESSINGS) ×2 IMPLANT
SUT ETHILON 3 0 PS 1 (SUTURE) ×6 IMPLANT
SUT MNCRL AB 4-0 PS2 18 (SUTURE) ×2 IMPLANT
SUT VIC AB 3-0 SH 8-18 (SUTURE) ×2 IMPLANT
SYR CONTROL 10ML LL (SYRINGE) ×2 IMPLANT
TOWEL OR 17X26 10 PK STRL BLUE (TOWEL DISPOSABLE) ×2 IMPLANT
TOWEL OR NON WOVEN STRL DISP B (DISPOSABLE) ×2 IMPLANT
WATER STERILE IRR 1000ML POUR (IV SOLUTION) IMPLANT
YANKAUER SUCT BULB TIP 10FT TU (MISCELLANEOUS) ×2 IMPLANT
YANKAUER SUCT BULB TIP NO VENT (SUCTIONS) IMPLANT

## 2013-01-19 NOTE — H&P (View-Only) (Signed)
Patient ID: MARCELA Lynn, female   DOB: 08-08-1928, 79 y.o.   MRN: [id number]  Chief Complaint  Patient presents with  . Mass    Evaluate new breast cancer    HPI Debra Lynn is a 79 y.o. female.   HPI  This is an 79 year old female who I know previously from a right mastectomy for a 2.1 cm invasive ductal cancer that was estrogen receptor positive, progesterone receptor negative, HER-2/neu is not amplified. She declined any adjuvant therapy area and she did not undergo sentinel node biopsy due to her comorbidities and would not change her treatment. She has had an area that is growing at the medial aspect of her mastectomy incision. She has been evaluated by the breast center and there are some stable regional calcifications in the left breast that are being followed. These been stable for one year. The ultrasound was also done of the right chest wall mass that showed a round mass with a circumscribed margin. There is some increased vascularity with this as well. This underwent a biopsy of the ultrasound guidance. The pathology of this shows this to be an invasive ductal carcinoma. This is estrogen receptor positive at 100%. This is progesterone receptor negative. Proliferation markers 28%. HER-2/neu is not amplified. This appears to be a chest wall recurrence of her previous cancer. She since our last visit has had a stroke that she was hospitalized in July for but is otherwise really unchanged. She comes in today with her family to discuss her options. Past Medical History  Diagnosis Date  . Sick sinus syndrome     with pacemaker and ablation  . Stroke 2010  . Glaucoma   . Seasonal allergic rhinitis     Pos Skin Test 07-05-07  . GERD (gastroesophageal reflux disease)   . Atrial fibrillation   . Hypertension   . Hyperlipidemia   . Cancer bREAST CA(12/2010)  . Chronic kidney disease TUMOR ON RIGHT KIDNEY-OBSERVING  . Arthritis KNEES &BACK  . Neuromuscular disorder SLIGHT TREMOR.  .  Bleeding from breast     right    Past Surgical History  Procedure Laterality Date  . Cholecystectomy    . Vesicovaginal fistula closure w/ tah    . Atrial ablation surgery    . Flexible bronchoscopy w/ upper endoscopy      and swallowing evaluation  . Pacemaker insertion    . Knee arthroscopy    . Cataract extraction    . Insert / replace / remove pacemaker  01/2007    [facility] BY [facility]  . Abdominal hysterectomy  1975    pt. unsure if laparascopic/vaginal/abdominal  . Breast surgery  01/2011    mastectomy    Family History  Problem Relation Age of Onset  . Asthma Brother   . Asthma Daughter   . Heart disease    . Cancer    . Heart failure Mother   . Cancer Father     brain  . Lymphoma Brother   . Diabetes Brother   . Hypertension Brother     Social History History  Substance Use Topics  . Smoking status: Never Smoker   . Smokeless tobacco: Never Used  . Alcohol Use: No    No Known Allergies  Current Outpatient Prescriptions  Medication Sig Dispense Refill  . acetaminophen (TYLENOL) 500 MG tablet Take 1,000 mg by mouth every 6 (six) hours as needed. For pain.      . benzonatate (TESSALON) 100 MG capsule Take  1 capsule (100 mg total) by mouth 3 (three) times daily as needed for cough.  200 capsule  3  . Calcium Carbonate-Vitamin D (CALCIUM-VITAMIN D) 500-200 MG-UNIT per tablet Take 1 tablet by mouth 2 (two) times daily with a meal.       . cephALEXin (KEFLEX) 250 MG capsule Take 1 capsule by mouth at bedtime.       . colestipol (COLESTID) 1 G tablet Take 1 g by mouth at bedtime.      . furosemide (LASIX) 40 MG tablet Take 1 tablet (40 mg total) by mouth daily.  30 tablet  2  . levETIRAcetam (KEPPRA) 500 MG tablet Take 250 mg by mouth every 12 (twelve) hours.      . lidocaine (LIDODERM) 5 % Place 1 patch onto the skin daily. Remove & Discard patch within 12 hours or as directed by MD  30 patch  0  . Multiple Vitamins-Minerals (CENTRUM SILVER) tablet  Take 1 tablet by mouth daily.        . pantoprazole (PROTONIX) 40 MG tablet Take 1 tablet by mouth daily.      . potassium chloride SA (K-DUR,KLOR-CON) 20 MEQ tablet Take 1 tablet by mouth at bedtime.       . pravastatin (PRAVACHOL) 40 MG tablet Take 80 mg by mouth at bedtime.       . Probiotic Product (ALIGN) 4 MG CAPS Take 4 mg by mouth daily.      . propranolol (INDERAL) 60 MG tablet Take 60 mg by mouth. 1/2 tablet twice daily      . RESTASIS 0.05 % ophthalmic emulsion Place 1 drop into both eyes every 12 (twelve) hours.       Marland Kitchen warfarin (COUMADIN) 2.5 MG tablet Take 0.5-1 tablets (1.25-2.5 mg total) by mouth every evening.  90 tablet  1   Current Facility-Administered Medications  Medication Dose Route Frequency Provider Last Rate Last Dose  . HYDROcodone-acetaminophen (NORCO) 5-325 MG per tablet 1-2 tablet  1-2 tablet Oral Q4H PRN Emelia Loron, MD        Review of Systems Review of Systems  Constitutional: Negative for fever, chills and unexpected weight change.  HENT: Negative for congestion, hearing loss, sore throat, trouble swallowing and voice change.   Eyes: Negative for visual disturbance.  Respiratory: Negative for cough and wheezing.   Cardiovascular: Negative for chest pain, palpitations and leg swelling.  Gastrointestinal: Negative for nausea, vomiting, abdominal pain, diarrhea, constipation, blood in stool, abdominal distention and anal bleeding.  Genitourinary: Negative for hematuria, vaginal bleeding and difficulty urinating.  Musculoskeletal: Negative for arthralgias.  Skin: Negative for rash and wound.  Neurological: Negative for seizures, syncope and headaches.  Hematological: Negative for adenopathy. Does not bruise/bleed easily.  Psychiatric/Behavioral: Negative for confusion.    Blood pressure 104/68, pulse 66, temperature 98.2 F (36.8 C), temperature source Temporal, resp. rate 16, height 5\' 3"  (1.6 m), weight 150 lb 9.6 oz (68.312 kg).  Physical  Exam Physical Exam  Vitals reviewed. Constitutional: She appears well-developed and well-nourished.  Cardiovascular: Normal rate and normal heart sounds.   Pulmonary/Chest: Effort normal and breath sounds normal. Left breast exhibits no inverted nipple, no mass, no nipple discharge, no skin change and no tenderness.    Lymphadenopathy:    She has no cervical adenopathy.    She has no axillary adenopathy.       Right: No supraclavicular adenopathy present.       Left: No supraclavicular adenopathy present.    Data Reviewed  [place] and path reviewed  Assessment    Chest wall recurrence s/p mastectomy     Plan    Right chest wall mass excision  I discussed right chest wall mass excision.  This appears small and amenable to local excision. I will discuss with Dr Darnelle Catalan any further evaluation as well as adding adjuvant therapy at this time although she may decline this again.  Will also discuss with her cardiologist Dr Eldridge Dace about coumadin and let her neurologist know as well due to recent stroke.  There are certainly some risks in operating on her including local that are surgical as well as others like another stroke.          Debra Lynn 01/08/2013, 3:50 PM

## 2013-01-19 NOTE — Interval H&P Note (Signed)
History and Physical Interval Note:  01/19/2013 11:06 AM  Debra Lynn  has presented today for surgery, with the diagnosis of breast  The various methods of treatment have been discussed with the patient and family. After consideration of risks, benefits and other options for treatment, the patient has consented to  Procedure(s): EXCISION CHEST WALL MASS (Right) as a surgical intervention .  The patient's history has been reviewed, patient examined, no change in status, stable for surgery.  I have reviewed the patient's chart and labs.  Questions were answered to the patient's satisfaction.     [name]

## 2013-01-19 NOTE — Progress Notes (Signed)
Nutrition Brief Note  Patient identified on the Malnutrition Screening Tool (MST) Report  Wt Readings from Last 15 Encounters:  01/19/13 152 lb (68.947 kg)  01/19/13 152 lb (68.947 kg)  01/15/13 148 lb (67.132 kg)  01/08/13 150 lb 9.6 oz (68.312 kg)  12/18/12 150 lb (68.04 kg)  11/20/12 152 lb 3.2 oz (69.037 kg)  08/23/12 154 lb 8.7 oz (70.1 kg)  07/03/12 157 lb (71.215 kg)  03/30/12 157 lb 3.2 oz (71.305 kg)  09/27/11 154 lb 14.4 oz (70.262 kg)  05/27/11 155 lb 12.8 oz (70.67 kg)  05/24/11 149 lb 9.6 oz (67.858 kg)  02/23/11 150 lb (68.04 kg)  02/22/11 151 lb 3.2 oz (68.584 kg)  02/15/11 149 lb (67.586 kg)    Body mass index is 26.93 kg/(m^2). Patient meets criteria for Overweight based on current BMI. Pt states that her appetite is good and she was eating well PTA with 3 meals daily. Per daughter at bedside, pt lost a few pounds after recent diet changes- pt had high triglycerides and started following a lower fat diet and eating less bread.   Current diet order is Clear Liquid, patient is consuming approximately 100% of meals at this time. Pt has no questions or concerns. Labs and medications reviewed.   No nutrition interventions warranted at this time. If nutrition issues arise, please consult RD.   Ian Malkin RD, LDN Inpatient Clinical Dietitian Pager: [phone] After Hours Pager: [phone]

## 2013-01-19 NOTE — Transfer of Care (Signed)
Immediate Anesthesia Transfer of Care Note  Patient: Debra Lynn  Procedure(s) Performed: Procedure(s): EXCISION CHEST WALL MASS (Right)  Patient Location: PACU  Anesthesia Type:General  Level of Consciousness: sedated  Airway & Oxygen Therapy: Patient Spontanous Breathing and Patient connected to face mask oxygen  Post-op Assessment: Report given to PACU RN and Post -op Vital signs reviewed and stable  Post vital signs: stable  Complications: No apparent anesthesia complications

## 2013-01-19 NOTE — Anesthesia Postprocedure Evaluation (Signed)
  Anesthesia Post-op Note  Patient: Debra Lynn  Procedure(s) Performed: Procedure(s) (LRB): EXCISION CHEST WALL MASS (Right)  Patient Location: PACU  Anesthesia Type: General  Level of Consciousness: awake and alert   Airway and Oxygen Therapy: Patient Spontanous Breathing  Post-op Pain: mild  Post-op Assessment: Post-op Vital signs reviewed, Patient's Cardiovascular Status Stable, Respiratory Function Stable, Patent Airway and No signs of Nausea or vomiting  Last Vitals:  Filed Vitals:   01/19/13 1310  BP:   Pulse: 60  Temp:   Resp: 16    Post-op Vital Signs: stable   Complications: No apparent anesthesia complications

## 2013-01-19 NOTE — Progress Notes (Signed)
   CARE MANAGEMENT NOTE 01/19/2013  Patient:  Debra Lynn, Debra Lynn   Account Number:  [id number]  Date Initiated:  01/19/2013  Documentation initiated by:  Lanier Clam  Subjective/Objective Assessment:   79 Y/O F ADMITTED W/CHEST WALL MASS.     Action/Plan:   FROM HOME W/SPOUSE.   Anticipated DC Date:  01/20/2013   Anticipated DC Plan:  HOME/SELF CARE      DC Planning Services  CM consult      Choice offered to / List presented to:             Status of service:  In process, will continue to follow Medicare Important Message given?   (If response is "NO", the following Medicare IM given date fields will be blank) Date Medicare IM given:   Date Additional Medicare IM given:    Discharge Disposition:    Per UR Regulation:  Reviewed for med. necessity/level of care/duration of stay  If discussed at Long Length of Stay Meetings, dates discussed:    Comments:

## 2013-01-19 NOTE — Anesthesia Preprocedure Evaluation (Addendum)
Anesthesia Evaluation  Patient identified by MRN, date of birth, ID band Patient awake    Reviewed: Allergy & Precautions, H&P , NPO status , Patient's Chart, lab work & pertinent test results, reviewed documented beta blocker date and time   Airway Mallampati: II TM Distance: >3 FB Neck ROM: full    Dental no notable dental hx. (+) Teeth Intact and Dental Advisory Given   Pulmonary neg pulmonary ROS,  breath sounds clear to auscultation  Pulmonary exam normal       Cardiovascular Exercise Tolerance: Good hypertension, Pt. on home beta blockers + dysrhythmias Atrial Fibrillation + pacemaker Rhythm:regular Rate:Normal  S/p ablation   Neuro/Psych Seizures -, Well Controlled,  Seizure x 1 2013. CVA 2010, 2014. glaucoma CVA negative psych ROS   GI/Hepatic negative GI ROS, Neg liver ROS, GERD-  Medicated and Controlled,  Endo/Other  negative endocrine ROS  Renal/GU negative Renal ROS  negative genitourinary   Musculoskeletal   Abdominal   Peds  Hematology negative hematology ROS (+)   Anesthesia Other Findings Breast cancer  Reproductive/Obstetrics negative OB ROS                          Anesthesia Physical Anesthesia Plan  ASA: III  Anesthesia Plan: General   Post-op Pain Management:    Induction: Intravenous  Airway Management Planned: LMA  Additional Equipment:   Intra-op Plan:   Post-operative Plan:   Informed Consent: I have reviewed the patients History and Physical, chart, labs and discussed the procedure including the risks, benefits and alternatives for the proposed anesthesia with the patient or authorized representative who has indicated his/her understanding and acceptance.   Dental Advisory Given  Plan Discussed with: CRNA and Surgeon  Anesthesia Plan Comments:         Anesthesia Quick Evaluation

## 2013-01-19 NOTE — Op Note (Signed)
Preoperative diagnosis: Recurrent right chest wall breast cancer Postoperative diagnosis: Same as above Procedure: Excision of recurrent right chest wall breast cancer, 10 x 4 cm Surgeon: Dr. Harden Mo Anesthesia: Gen. With LMA Estimated blood loss: Minimal Specimens: Right skin, subcutaneous tissue and muscle marked with paint, and the skin as anterior Drains: None Complications: None Sponge count correct at completion Disposition to recovery stable  Indications: This is an 79 year old female who I know well from a right mastectomy. She has multiple medical problems. She elected not to undergo neoadjuvant therapy. Since then she has had a mass at the medial aspect of her mastectomy incision show up. This was biopsied which showed invasive ductal carcinoma. I discussed this with medical oncology and we decided to proceed with a wide local excision of this recurrence. She and I discussed this along with her daughter prior to beginning.  Procedure: After informed consent was obtained the patient was taken to the operating room. She was given cefazolin. Sequential compression devices were on her legs. She had a little blister that was a hematoma from her biopsy site below this and we have placed her on antibiotics several days beforehand and there was no residual erythema at all. She was placed under general anesthesia. A magnet was placed on her pacemaker device. She was then prepped and draped in the standard sterile surgical fashion. Surgical time out was then performed.  I made an elliptical incision that measured about 10 x 4 cm surrounding the mass this also included the hematoma. It was difficult to differentiate these two. I then proceeded to use a combination of sharp dissection and judicious electrocautery to excise this all the way down to the muscle. At the site of the mass I did remove some muscle along with this. The deep margin of this is her rib cage now. I then marked this with  paint. I then spent some time getting hemostasis with a combination of some electrocautery as well as Vicryl sutures. I then mobilized flaps a little bit superior and inferior. I then closed the deep layer with 3-0 Vicryl suture. This came together without very much tension. I then closed the skin with 3-0 nylon sutures. I injected Marcaine throughout this area. I placed bacitracin and a sterile dressing. A breast binder was placed. She tolerated this well and was transferred to recovery.

## 2013-01-20 ENCOUNTER — Encounter (HOSPITAL_COMMUNITY): Payer: Self-pay | Admitting: Surgery

## 2013-01-20 DIAGNOSIS — G252 Other specified forms of tremor: Secondary | ICD-10-CM | POA: Diagnosis present

## 2013-01-20 DIAGNOSIS — G20 Parkinson's disease: Secondary | ICD-10-CM | POA: Diagnosis present

## 2013-01-20 DIAGNOSIS — I951 Orthostatic hypotension: Secondary | ICD-10-CM | POA: Insufficient documentation

## 2013-01-20 LAB — PROTIME-INR
INR: 1.08 (ref 0.00–1.49)
Prothrombin Time: 13.8 seconds (ref 11.6–15.2)

## 2013-01-20 LAB — BASIC METABOLIC PANEL
BUN: 11 mg/dL (ref 6–23)
CO2: 24 mEq/L (ref 19–32)
Calcium: 8.8 mg/dL (ref 8.4–10.5)
Chloride: 106 mEq/L (ref 96–112)
Creatinine, Ser: 0.74 mg/dL (ref 0.50–1.10)
GFR calc Af Amer: 89 mL/min — ABNORMAL LOW (ref >90)
GFR calc non Af Amer: 77 mL/min — ABNORMAL LOW (ref >90)
Glucose, Bld: 95 mg/dL (ref 70–99)
Potassium: 3.6 mEq/L (ref 3.5–5.1)
Sodium: 140 mEq/L (ref 135–145)

## 2013-01-20 MED ORDER — BISACODYL 10 MG RE SUPP: 10.0000 mg | Freq: Two times a day (BID) | RECTAL | Status: DC | PRN

## 2013-01-20 MED ORDER — DIPHENHYDRAMINE HCL 50 MG/ML IJ SOLN: 12.5000 mg | Freq: Four times a day (QID) | INTRAMUSCULAR | Status: DC | PRN

## 2013-01-20 MED ORDER — ALIGN 4 MG PO CAPS: 4.0000 mg | ORAL_CAPSULE | Freq: Every day | ORAL | Status: DC

## 2013-01-20 MED ORDER — HYDROCODONE-ACETAMINOPHEN 5-325 MG PO TABS: 1.0000 | ORAL_TABLET | Freq: Four times a day (QID) | ORAL | Status: DC | PRN

## 2013-01-20 MED ORDER — LACTATED RINGERS IV BOLUS (SEPSIS): 1000.0000 mL | Freq: Three times a day (TID) | INTRAVENOUS | Status: DC | PRN

## 2013-01-20 MED ORDER — LIP MEDEX EX OINT
1.0000 "application " | TOPICAL_OINTMENT | Freq: Two times a day (BID) | CUTANEOUS | Status: DC
  Administered 2013-01-20: 1 via TOPICAL
  Filled 2013-01-20: qty 7

## 2013-01-20 MED ORDER — ACETAMINOPHEN 325 MG PO TABS
650.0000 mg | ORAL_TABLET | Freq: Three times a day (TID) | ORAL | Status: DC
  Administered 2013-01-20: 650 mg via ORAL
  Filled 2013-01-20: qty 2

## 2013-01-20 MED ORDER — SODIUM CHLORIDE 0.9 % IJ SOLN: 3.0000 mL | Freq: Two times a day (BID) | INTRAMUSCULAR | Status: DC

## 2013-01-20 MED ORDER — METOPROLOL TARTRATE 1 MG/ML IV SOLN
5.0000 mg | Freq: Four times a day (QID) | INTRAVENOUS | Status: DC | PRN
Start: 2013-01-20 — End: 2013-01-20

## 2013-01-20 MED ORDER — ACETAMINOPHEN 500 MG PO TABS: 1000.0000 mg | ORAL_TABLET | Freq: Three times a day (TID) | ORAL | Status: DC

## 2013-01-20 MED ORDER — ADULT MULTIVITAMIN W/MINERALS CH
1.0000 | ORAL_TABLET | Freq: Every day | ORAL | Status: DC
  Administered 2013-01-20: 1 via ORAL
  Filled 2013-01-20: qty 1

## 2013-01-20 MED ORDER — ALUM & MAG HYDROXIDE-SIMETH 200-200-20 MG/5ML PO SUSP: 30.0000 mL | Freq: Four times a day (QID) | ORAL | Status: DC | PRN

## 2013-01-20 MED ORDER — PSYLLIUM 95 % PO PACK
1.0000 | PACK | Freq: Two times a day (BID) | ORAL | Status: DC
  Administered 2013-01-20: 1 via ORAL
  Filled 2013-01-20 (×2): qty 1

## 2013-01-20 MED ORDER — ONDANSETRON HCL 4 MG PO TABS: 4.0000 mg | ORAL_TABLET | Freq: Four times a day (QID) | ORAL | Status: DC | PRN

## 2013-01-20 MED ORDER — MAGIC MOUTHWASH
15.0000 mL | Freq: Four times a day (QID) | ORAL | Status: DC | PRN
  Filled 2013-01-20: qty 15

## 2013-01-20 MED ORDER — ALIGN 4 MG PO CAPS
4.0000 mg | ORAL_CAPSULE | Freq: Every day | ORAL | Status: DC
  Administered 2013-01-20: 4 mg via ORAL
  Filled 2013-01-20: qty 1

## 2013-01-20 MED ORDER — SODIUM CHLORIDE 0.9 % IJ SOLN: 3.0000 mL | INTRAMUSCULAR | Status: DC | PRN

## 2013-01-20 NOTE — Progress Notes (Signed)
Debra Lynn [id number] Feb 04, 1928  CARE TEAM:  PCP: Beverley Fiedler, MD  Outpatient Care Team: Patient Care Team: Clayborn Heron, MD as PCP - General (Family Medicine)  Inpatient Treatment Team: Treatment Team: Attending Provider: Emelia Loron, MD; Registered Nurse: Erick Colace, RN; Registered Nurse: Carmelina Peal, RN; Technician: Lynwood Dawley, NT   Subjective:  Mild nausea Pain controlled Husband & CNA in room  Objective:  Vital signs:  Filed Vitals:   01/19/13 1351 01/19/13 2058 01/20/13 0127 01/20/13 0530  BP: 146/73 104/51 105/58 97/52  Pulse: 60 60 60 62  Temp: 97.2 F (36.2 C) 97.6 F (36.4 C) 98 F (36.7 C) 97.9 F (36.6 C)  TempSrc: Oral Oral Oral Oral  Resp: 18 18 20 18   Height: 5\' 3"  (1.6 m)     Weight: 152 lb (68.947 kg)     SpO2: 95% 96% 94% 96%    Last BM Date: 01/19/13  Intake/Output   Yesterday:  12/19 0701 - 12/20 0700 In: 445.8 [I.V.:345.8; IV Piggyback:100] Out: 5 [Blood:5] This shift:     Bowel function:  Flatus: y   BM: N  Drain: n/a  Physical Exam:  General: Pt awake/alert/oriented x4 in NO acute distress Eyes: PERRL, normal EOM.  Sclera clear.  No icterus Neuro: CN II-XII intact w/o focal sensory/motor deficits.  Mild head tremor Lymph: No head/neck/groin lymphadenopathy Psych:  No delerium/psychosis/paranoia HENT: Normocephalic, Mucus membranes moist.  No thrush Neck: Supple, No tracheal deviation Chest: No chest wall pain w good excursion.  Dressing clean/dry CV:  Pulses intact.  Regular rhythm MS: Normal AROM mjr joints.  No obvious deformity Abdomen: Soft.  Nondistended.  No evidence of peritonitis.  No incarcerated hernias. Ext:  SCDs BLE.  No mjr edema.  No cyanosis Skin: No petechiae / purpura   Problem List:   Principal Problem:   Chest wall recurrence of breast cancer Active Problems:   Cancer of upper-inner quadrant of female breast   Atrial fibrillation   Resting head tremor    Parkinsonism   Assessment  Debra Lynn  78 y.o. female  1 Day Post-Op  Procedure(s): EXCISION CHEST WALL MASS  Preoperative diagnosis: Recurrent right chest wall breast cancer   Postoperative diagnosis: Same as above   Procedure: Excision of recurrent right chest wall breast cancer, 10 x 4 cm   Surgeon: Dr. Harden Mo   Stabilizing  Plan:  -d/c IVF -adv diet -VTE prophylaxis- SCDs, etc -cont HTN/Afib  -mobilize as tolerated to help recovery  D/C patient from hospital when patient meets criteria (anticipate in later today vs tomorrow AM):  Tolerating oral intake well Ambulating in walkways Adequate pain control without IV medications Urinating  Having flatus   Ardeth Sportsman, M.D., F.A.C.S. Gastrointestinal and Minimally Invasive Surgery [facility] [address], [address] [place], [place] [address] [phone] Main / Paging   01/20/2013   Results:   Labs: Results for orders placed during the hospital encounter of 01/19/13 (from the past 48 hour(s))  PROTIME-INR     Status: None   Collection Time    01/19/13 10:00 AM      Result Value Range   Prothrombin Time 13.8  11.6 - 15.2 seconds   INR 1.08  0.00 - 1.49  BASIC METABOLIC PANEL     Status: Abnormal   Collection Time    01/20/13  6:15 AM      Result Value Range   Sodium 140  135 - 145 mEq/L  Potassium 3.6  3.5 - 5.1 mEq/L   Chloride 106  96 - 112 mEq/L   CO2 24  19 - 32 mEq/L   Glucose, Bld 95  70 - 99 mg/dL   BUN 11  6 - 23 mg/dL   Creatinine, Ser 1.61  0.50 - 1.10 mg/dL   Calcium 8.8  8.4 - 09.6 mg/dL   GFR calc non Af Amer 77 (*) >90 mL/min   GFR calc Af Amer 89 (*) >90 mL/min   Comment: (NOTE)     The eGFR has been calculated using the CKD EPI equation.     This calculation has not been validated in all clinical situations.     eGFR's persistently <90 mL/min signify possible Chronic Kidney     Disease.  PROTIME-INR     Status: None   Collection Time     01/20/13  6:15 AM      Result Value Range   Prothrombin Time 13.8  11.6 - 15.2 seconds   INR 1.08  0.00 - 1.49    Imaging / Studies: No results found.  Medications / Allergies: per chart  Antibiotics: Anti-infectives   Start     Dose/Rate Route Frequency Ordered Stop   01/19/13 1900  ceFAZolin (ANCEF) IVPB 1 g/50 mL premix     1 g 100 mL/hr over 30 Minutes Intravenous 3 times per day 01/19/13 1357 01/20/13 0628   01/19/13 0926  ceFAZolin (ANCEF) IVPB 2 g/50 mL premix     2 g 100 mL/hr over 30 Minutes Intravenous On call to O.R. 01/19/13 0926 01/19/13 1130       Note: This dictation was prepared with Dragon/digital dictation along with Smartphrase technology. Any transcriptional errors that result from this process are unintentional.

## 2013-01-20 NOTE — Progress Notes (Signed)
Utilization Review completed.  

## 2013-01-21 NOTE — Discharge Summary (Signed)
Physician Discharge Summary  Patient ID: Debra Lynn MRN: [id number] DOB/AGE: [birth year] 79 y.o.  Admit date: 01/19/2013 Discharge date: 01/20/2013  Patient Care Team: Clayborn Heron, MD as PCP - General (Family Medicine) Lowella Dell, MD as Consulting Physician (Oncology) Emelia Loron, MD as Consulting Physician (General Surgery)   Admission Diagnoses:  Discharge Diagnoses:  Principal Problem:   Chest wall recurrence of breast cancer Active Problems:   Cancer of upper-inner quadrant of female breast   Atrial fibrillation   Resting head tremor   Parkinsonism   Discharged Condition: good  Hospital Course:   Pleasant elderly woman with local recurrence of right breast cancer.  Underwent excision.  Postoperatively, the patient mobilized in the hallways and advanced to a solid diet gradually.  Pain was well-controlled and transitioned off IV medications.    By the time of discharge, the patient was walking well the hallways, eating food well, having flatus.  Pain was-controlled on an oral regimen.  Based on meeting DC criteria and recovering well, I felt it was safe for the patient to be discharged home with close followup.  Instructions were discussed in detail.  They are written as well.  Consults: None  Significant Diagnostic Studies:   Treatments:   Preoperative diagnosis: Recurrent right chest wall breast cancer  Postoperative diagnosis: Same as above  Procedure: Excision of recurrent right chest wall breast cancer, 10 x 4 cm  Surgeon: Dr. Harden Mo   Discharge Exam: Blood pressure 98/55, pulse 60, temperature 97.7 F (36.5 C), temperature source Oral, resp. rate 18, height 5\' 3"  (1.6 m), weight 152 lb (68.947 kg), SpO2 94.00%.  General: Pt awake/alert/oriented x4 in no major acute distress Eyes: PERRL, normal EOM. Sclera nonicteric Neuro: CN II-XII intact w/o focal sensory/motor deficits.  Baseline head/neck tremor. Lymph: No head/neck/groin  lymphadenopathy Psych:  No delerium/psychosis/paranoia HENT: Normocephalic, Mucus membranes moist.  No thrush Neck: Supple, No tracheal deviation Chest: No pain.  Good respiratory excursion.  Dressing clean dry and intact. CV:  Pulses intact.  Regular rhythm MS: Normal AROM mjr joints.  No obvious deformity Abdomen: Soft, Nondistended.  Nontender.  No incarcerated hernias. Ext:  SCDs BLE.  No significant edema.  No cyanosis Skin: No petechiae / purpura   Disposition: 01-Home or Self Care  Discharge Orders   Future Appointments Provider Department Dept Phone   01/23/2013 2:00 PM [facility] [place] Office [phone]   03/29/2013 1:30 PM [facility] MEDICAL ONCOLOGY [phone]   03/29/2013 2:00 PM Lowella Dell, MD [facility] MEDICAL ONCOLOGY [phone]   04/23/2013 2:45 PM Hillis Range, MD [facility] [place] Office [phone]   11/22/2013 1:30 PM Waymon Budge, MD [facility] [phone]   Future Orders Complete By Expires   Call MD for:  extreme fatigue  As directed    Call MD for:  hives  As directed    Call MD for:  persistant nausea and vomiting  As directed    Call MD for:  redness, tenderness, or signs of infection (pain, swelling, redness, odor or green/yellow discharge around incision site)  As directed    Call MD for:  severe uncontrolled pain  As directed    Call MD for:  As directed    Comments:     Temperature > 101.82F   Diet - low sodium heart healthy  As directed    Discharge instructions  As directed    Comments:  Please see discharge instruction sheets.  Also refer to handout given an office.  Please call our office if you have any questions or concerns [phone]   Discharge wound care:  As directed    Comments:     If you have closed incisions, shower and bathe over these incisions with soap and water every day.  Remove all surgical dressings on  postoperative day #3.  You do not need to replace dressings over the closed incisions unless you feel more comfortable with a Band-Aid covering it.   If you have an open wound that requires packing, please see wound care instructions.  In general, remove all dressings, wash wound with soap and water and then replace with saline moistened gauze.  Do the dressing change at least every day.  Please call our office [phone] if you have further questions.   Driving Restrictions  As directed    Comments:     No driving until off narcotics and can safely swerve away without pain during an emergency   Increase activity slowly  As directed    Comments:     Walk an hour a day.  Use 20-30 minute walks.  When you can walk 30 minutes without difficulty, increase to low impact/moderate activities such as biking, jogging, swimming, sexual activity..  Eventually can increase to unrestricted activity when not feeling pain.  If you feel pain: STOP!Marland Kitchen   Let pain protect you from overdoing it.  Use ice/heat/over-the-counter pain medications to help minimize his soreness.  Use pain prescriptions as needed to remain active.  It is better to take extra pain medications and be more active than to stay bedridden to avoid all pain medications.   Lifting restrictions  As directed    Comments:     Avoid heavy lifting initially.  Do not push through pain.  You have no specific weight limit.  Coughing and sneezing or four more stressful to your incision than any lifting you will do. Pain will protect you from injury.  Therefore, avoid intense activity until off all narcotic pain medications.  Coughing and sneezing or four more stressful to your incision than any lifting he will do.   May shower / Bathe  As directed    May walk up steps  As directed    Sexual Activity Restrictions  As directed    Comments:     Sexual activity as tolerated.  Do not push through pain.  Pain will protect you from injury.   Walk with assistance   As directed    Comments:     Walk over an hour a day.  May use a walker/cane/companion to help with balance and stamina.       Medication List         acetaminophen 500 MG tablet  Commonly known as:  TYLENOL  Take 1,000 mg by mouth every 6 (six) hours as needed. For pain.     ALIGN 4 MG Caps  Take 4 mg by mouth daily.     ALIGN 4 MG Caps  Take 4 mg by mouth daily.     benzonatate 100 MG capsule  Commonly known as:  TESSALON  Take 100 mg by mouth 3 (three) times daily as needed for cough.     calcium-vitamin D 500-200 MG-UNIT per tablet  Take 1 tablet by mouth daily.     cephALEXin 250 MG capsule  Commonly known as:  KEFLEX  Take 1 capsule by mouth at bedtime.  colestipol 1 G tablet  Commonly known as:  COLESTID  Take 1 g by mouth at bedtime.     enoxaparin 80 MG/0.8ML injection  Commonly known as:  LOVENOX  Inject 0.7 mLs (70 mg total) into the skin every 12 (twelve) hours.     furosemide 40 MG tablet  Commonly known as:  LASIX  Take 40 mg by mouth every morning.     HYDROcodone-acetaminophen 5-325 MG per tablet  Commonly known as:  NORCO/VICODIN  Take 1-2 tablets by mouth every 6 (six) hours as needed for moderate pain or severe pain.     levETIRAcetam 500 MG tablet  Commonly known as:  KEPPRA  Take 250 mg by mouth every 12 (twelve) hours.     multivitamin with minerals Tabs tablet  Take 1 tablet by mouth daily.     pantoprazole 40 MG tablet  Commonly known as:  PROTONIX  Take 1 tablet by mouth daily.     potassium chloride SA 20 MEQ tablet  Commonly known as:  K-DUR,KLOR-CON  Take 1 tablet by mouth at bedtime.     pravastatin 80 MG tablet  Commonly known as:  PRAVACHOL  Take 80 mg by mouth at bedtime.     propranolol 60 MG tablet  Commonly known as:  INDERAL  Take 30 mg by mouth 2 (two) times daily. 1/2 tablet twice daily     RESTASIS 0.05 % ophthalmic emulsion  Generic drug:  cycloSPORINE  Place 1 drop into both eyes every 12 (twelve)  hours.     warfarin 2.5 MG tablet  Commonly known as:  COUMADIN  Take 1.25-2.5 mg by mouth daily. Takes 1 tablet on Monday ,wednesday and Friday and 1/2 tablet all other days           Follow-up Information   Follow up with Emelia Loron, MD On 01/29/2013.   Specialty:  General Surgery   Contact information:   [address] [place] [address] [phone]       Signed: Ardeth Sportsman. 01/21/2013, 9:27 AM

## 2013-01-22 ENCOUNTER — Encounter (HOSPITAL_COMMUNITY): Payer: Self-pay | Admitting: General Surgery

## 2013-01-22 ENCOUNTER — Encounter (INDEPENDENT_AMBULATORY_CARE_PROVIDER_SITE_OTHER): Payer: Self-pay

## 2013-01-23 ENCOUNTER — Ambulatory Visit (INDEPENDENT_AMBULATORY_CARE_PROVIDER_SITE_OTHER): Payer: Medicare Other | Admitting: Pharmacist

## 2013-01-23 ENCOUNTER — Telehealth (INDEPENDENT_AMBULATORY_CARE_PROVIDER_SITE_OTHER): Payer: Self-pay

## 2013-01-23 DIAGNOSIS — I6789 Other cerebrovascular disease: Secondary | ICD-10-CM

## 2013-01-23 DIAGNOSIS — I4891 Unspecified atrial fibrillation: Secondary | ICD-10-CM

## 2013-01-23 LAB — POCT INR: INR: 1.5

## 2013-01-23 NOTE — Telephone Encounter (Signed)
Notified pt's daughter that the pathology report did show an invasive ductal carcinoma but it's all been removed per Dr Dwain Sarna.

## 2013-01-29 ENCOUNTER — Ambulatory Visit (INDEPENDENT_AMBULATORY_CARE_PROVIDER_SITE_OTHER): Payer: Medicare Other | Admitting: General Surgery

## 2013-01-29 ENCOUNTER — Encounter (INDEPENDENT_AMBULATORY_CARE_PROVIDER_SITE_OTHER): Payer: Self-pay | Admitting: General Surgery

## 2013-01-29 VITALS — BP 128/78 | HR 71 | Temp 98.0°F | Resp 18 | Ht 65.0 in | Wt 151.0 lb

## 2013-01-29 DIAGNOSIS — Z09 Encounter for follow-up examination after completed treatment for conditions other than malignant neoplasm: Secondary | ICD-10-CM

## 2013-01-29 NOTE — Progress Notes (Signed)
Subjective:     Patient ID: Debra Lynn, female   DOB: [birth year], 79 y.o.   MRN: [id number]  HPI 79 year old female with multiple medical problems who underwent excision of a chest wall recurrence after a right mastectomy. Her pathology showed adenocarcinoma consistent with a breast carcinoma. She has done well. She returns today without any real complaints.  Review of Systems     Objective:   Physical Exam Incision healing well with stitches in place. There is no evidence of any infection in the uses all viable.    Assessment:     Status post excision of local recurrence of breast cancer     Plan:     She is doing well. I will remove her stitches next week. I will refer her to see both medical and radiation oncology as soon as possible also.

## 2013-01-30 ENCOUNTER — Ambulatory Visit (INDEPENDENT_AMBULATORY_CARE_PROVIDER_SITE_OTHER): Payer: Medicare Other | Admitting: Pharmacist

## 2013-01-30 DIAGNOSIS — I6789 Other cerebrovascular disease: Secondary | ICD-10-CM

## 2013-01-30 DIAGNOSIS — I4891 Unspecified atrial fibrillation: Secondary | ICD-10-CM

## 2013-01-30 LAB — POCT INR: INR: 1.9

## 2013-02-02 ENCOUNTER — Ambulatory Visit (INDEPENDENT_AMBULATORY_CARE_PROVIDER_SITE_OTHER): Payer: Medicare Other

## 2013-02-02 DIAGNOSIS — J309 Allergic rhinitis, unspecified: Secondary | ICD-10-CM

## 2013-02-05 ENCOUNTER — Encounter (INDEPENDENT_AMBULATORY_CARE_PROVIDER_SITE_OTHER): Payer: Self-pay | Admitting: General Surgery

## 2013-02-05 ENCOUNTER — Telehealth: Payer: Self-pay | Admitting: *Deleted

## 2013-02-05 ENCOUNTER — Ambulatory Visit (INDEPENDENT_AMBULATORY_CARE_PROVIDER_SITE_OTHER): Payer: Medicare Other | Admitting: General Surgery

## 2013-02-05 ENCOUNTER — Encounter: Payer: Self-pay | Admitting: *Deleted

## 2013-02-05 VITALS — BP 118/76 | HR 72 | Temp 98.9°F | Resp 15 | Ht 63.0 in | Wt 150.6 lb

## 2013-02-05 DIAGNOSIS — Z09 Encounter for follow-up examination after completed treatment for conditions other than malignant neoplasm: Secondary | ICD-10-CM

## 2013-02-05 NOTE — Progress Notes (Signed)
Subjective:     Patient ID: Debra Lynn, female   DOB: 22-Sep-1928, 80 y.o.   MRN: [id number]  HPI  This is an 80 year old female who had a recurrent breast cancer after mastectomy. She recently had this excised. She has already been back to discuss her pathology. She has appointments with radiation oncology and medical oncology pending. She is now well and really is without any complaints except for some soreness underneath her axilla and in her right  shoulder. I think this is just because she has not been moving her arm around as much. She comes back in today for suture removal doing well with her daughter.  Review of Systems     Objective:   Physical Exam Incision clean without any evidence of infection, sutures in place    Assessment:     Status post chest wall recurrence of breast cancer after mastectomy     Plan:     She can return to normal activity as tolerated. I did give her some exercises. I offered her physical therapy appointment but she declined at today. We will try to get her medical oncology appointment sooner she is due to see Dr. Lisbeth Renshaw next week. I will base followup after they decide on adjuvant therapy.I removed her sutures today and applied steristrips.

## 2013-02-05 NOTE — Telephone Encounter (Signed)
sw pt gv appt  For 02/15/13 w/labs@ 1:30pm and ov@ 2pm. Pt is aware...td

## 2013-02-08 ENCOUNTER — Encounter: Payer: Self-pay | Admitting: Internal Medicine

## 2013-02-08 ENCOUNTER — Ambulatory Visit: Payer: Medicare Other

## 2013-02-09 ENCOUNTER — Ambulatory Visit (INDEPENDENT_AMBULATORY_CARE_PROVIDER_SITE_OTHER): Payer: Medicare Other | Admitting: Pharmacist

## 2013-02-09 ENCOUNTER — Ambulatory Visit (INDEPENDENT_AMBULATORY_CARE_PROVIDER_SITE_OTHER): Payer: Medicare Other

## 2013-02-09 DIAGNOSIS — J309 Allergic rhinitis, unspecified: Secondary | ICD-10-CM

## 2013-02-09 DIAGNOSIS — I4891 Unspecified atrial fibrillation: Secondary | ICD-10-CM

## 2013-02-09 DIAGNOSIS — I6789 Other cerebrovascular disease: Secondary | ICD-10-CM

## 2013-02-09 LAB — POCT INR: INR: 3

## 2013-02-13 ENCOUNTER — Telehealth: Payer: Self-pay | Admitting: *Deleted

## 2013-02-13 NOTE — Progress Notes (Signed)
Location of Breast Cancer: Right skin nodule at mastectomy scar (recurrent chest wall  breast cancer dx 2012)  Histology per Pathology Report:  01/02/13 : DiagnosisBreast, right, needle core biopsy- INVASIVE DUCTAL CARCINOMA  01/15/11:DiagnosisBreast, simple mastectomy, Right- INVASIVE DUCTAL CARCINOMA, 2.2 CM, [facility] GRADE III.- NO EVIDENCE OF ANGIOLYMPHATIC INVASION. - RESECTION MARGINS CLEAR. PLEASE SEE ONCOLOGY TEMPLATE FOR DETAILS, Dr. [name]  Receptor Status: ER( +  ), PR ( -  ), Her2-neu ( - neg., no amplification  )  Did patient present with symptoms (if so, please note symptoms) or was this found on screening mammography?: routine screening mammogram 12/15/10   Past/Anticipated interventions by surgeon, if any: 01/14/13: DiagnosisBreast, simple mastectomy, Right- INVASIVE DUCTAL CARCINOMA, 2.2 CM, [facility] GRADE III.- NO EVIDENCE OF ANGIOLYMPHATIC INVASION. - RESECTION MARGINS CLEAR. Dr. [name] seen 02/05/13, offered exercises and physical therapy patient declined.  Past/Anticipated interventions by medical oncology, if any: Chemotherapy :Appt with Dr. Jana Hakim 02/15/13  Lymphedema issues, if any: no Pain: achiness rt chest since bx a 5 on 1-10 scale, takes tylenol prn, incision with steri strips on ,helaing clean SAFETY ISSUES:  Prior radiation?   Pacemaker/ICD? Yes, on 3rd pacemaker, last checked by Hetty Blend, [facility]) Dr. Thompson Grayer  To see patient   In March   01/06/13  Possible current pregnancy ?No  Is the patient on methotrexate? no  Current Complaints / other details:Married,  1 daughter, 3rd pacemaker, under Dr.James Allred, Cardiologist  HX:  Sick sinus syndrome,stroke, vesicovaginal fistula,atrial ablative surgery,knee arthoscopy,cataract surgery, 2 aunts breast cancer,, father metastatic brain cancer, brother non-hodgkins lymphoma, A-Fib, Parkinsonism,resting head tremor    Juddson Cobern, Felicita Gage, RN 02/13/2013,1:51 PM

## 2013-02-13 NOTE — Telephone Encounter (Signed)
Called and spoke with patient, will bring in her pacemaker card tomorrow for consult"I'm on my 3rd pacemaker"

## 2013-02-14 ENCOUNTER — Ambulatory Visit
Admission: RE | Admit: 2013-02-14 | Discharge: 2013-02-14 | Disposition: A | Discharge status: Home / Self Care | Payer: Medicare Other | Source: Ambulatory Visit | Attending: Radiation Oncology | Admitting: Radiation Oncology

## 2013-02-14 ENCOUNTER — Other Ambulatory Visit: Payer: Self-pay | Admitting: Physician Assistant

## 2013-02-14 ENCOUNTER — Encounter: Payer: Self-pay | Admitting: *Deleted

## 2013-02-14 VITALS — BP 104/84 | HR 68 | Temp 97.4°F | Resp 20 | Ht 63.0 in | Wt 151.8 lb

## 2013-02-14 DIAGNOSIS — Z79899 Other long term (current) drug therapy: Secondary | ICD-10-CM | POA: Insufficient documentation

## 2013-02-14 DIAGNOSIS — Z170 Estrogen receptor positive status [ER+]: Secondary | ICD-10-CM | POA: Insufficient documentation

## 2013-02-14 DIAGNOSIS — C50211 Malignant neoplasm of upper-inner quadrant of right female breast: Secondary | ICD-10-CM

## 2013-02-14 DIAGNOSIS — Z7901 Long term (current) use of anticoagulants: Secondary | ICD-10-CM | POA: Insufficient documentation

## 2013-02-14 DIAGNOSIS — Z950 Presence of cardiac pacemaker: Secondary | ICD-10-CM | POA: Insufficient documentation

## 2013-02-14 DIAGNOSIS — C50919 Malignant neoplasm of unspecified site of unspecified female breast: Secondary | ICD-10-CM | POA: Insufficient documentation

## 2013-02-14 DIAGNOSIS — C50219 Malignant neoplasm of upper-inner quadrant of unspecified female breast: Secondary | ICD-10-CM

## 2013-02-14 DIAGNOSIS — Z9010 Acquired absence of unspecified breast and nipple: Secondary | ICD-10-CM | POA: Insufficient documentation

## 2013-02-15 ENCOUNTER — Ambulatory Visit (INDEPENDENT_AMBULATORY_CARE_PROVIDER_SITE_OTHER): Payer: Medicare Other

## 2013-02-15 ENCOUNTER — Other Ambulatory Visit (HOSPITAL_BASED_OUTPATIENT_CLINIC_OR_DEPARTMENT_OTHER): Payer: Medicare Other

## 2013-02-15 ENCOUNTER — Telehealth: Payer: Self-pay | Admitting: Oncology

## 2013-02-15 ENCOUNTER — Ambulatory Visit (HOSPITAL_BASED_OUTPATIENT_CLINIC_OR_DEPARTMENT_OTHER): Payer: Medicare Other | Admitting: Oncology

## 2013-02-15 VITALS — BP 136/84 | HR 78 | Temp 98.1°F | Resp 18 | Ht 63.0 in | Wt 150.4 lb

## 2013-02-15 DIAGNOSIS — C44599 Other specified malignant neoplasm of skin of other part of trunk: Secondary | ICD-10-CM

## 2013-02-15 DIAGNOSIS — C50919 Malignant neoplasm of unspecified site of unspecified female breast: Secondary | ICD-10-CM

## 2013-02-15 DIAGNOSIS — C50219 Malignant neoplasm of upper-inner quadrant of unspecified female breast: Secondary | ICD-10-CM

## 2013-02-15 DIAGNOSIS — I4891 Unspecified atrial fibrillation: Secondary | ICD-10-CM

## 2013-02-15 DIAGNOSIS — Z950 Presence of cardiac pacemaker: Secondary | ICD-10-CM

## 2013-02-15 DIAGNOSIS — M949 Disorder of cartilage, unspecified: Secondary | ICD-10-CM

## 2013-02-15 DIAGNOSIS — I495 Sick sinus syndrome: Secondary | ICD-10-CM

## 2013-02-15 DIAGNOSIS — I639 Cerebral infarction, unspecified: Secondary | ICD-10-CM

## 2013-02-15 DIAGNOSIS — C7989 Secondary malignant neoplasm of other specified sites: Secondary | ICD-10-CM

## 2013-02-15 DIAGNOSIS — C44509 Unspecified malignant neoplasm of skin of other part of trunk: Secondary | ICD-10-CM

## 2013-02-15 DIAGNOSIS — J309 Allergic rhinitis, unspecified: Secondary | ICD-10-CM

## 2013-02-15 DIAGNOSIS — M899 Disorder of bone, unspecified: Secondary | ICD-10-CM

## 2013-02-15 DIAGNOSIS — G20C Parkinsonism, unspecified: Secondary | ICD-10-CM

## 2013-02-15 DIAGNOSIS — G20 Parkinson's disease: Secondary | ICD-10-CM

## 2013-02-15 LAB — CBC WITH DIFFERENTIAL/PLATELET
BASO%: 0.4 % (ref 0.0–2.0)
Basophils Absolute: 0 10*3/uL (ref 0.0–0.1)
EOS%: 6.8 % (ref 0.0–7.0)
Eosinophils Absolute: 0.5 10*3/uL (ref 0.0–0.5)
HCT: 38.7 % (ref 34.8–46.6)
HGB: 13.3 g/dL (ref 11.6–15.9)
LYMPH%: 24.7 % (ref 14.0–49.7)
MCH: 29.8 pg (ref 25.1–34.0)
MCHC: 34.4 g/dL (ref 31.5–36.0)
MCV: 86.6 fL (ref 79.5–101.0)
MONO#: 0.6 10*3/uL (ref 0.1–0.9)
MONO%: 8.6 % (ref 0.0–14.0)
NEUT#: 4.4 10*3/uL (ref 1.5–6.5)
NEUT%: 59.5 % (ref 38.4–76.8)
Platelets: 192 10*3/uL (ref 145–400)
RBC: 4.47 10*6/uL (ref 3.70–5.45)
RDW: 13.9 % (ref 11.2–14.5)
WBC: 7.3 10*3/uL (ref 3.9–10.3)
lymph#: 1.8 10*3/uL (ref 0.9–3.3)

## 2013-02-15 LAB — COMPREHENSIVE METABOLIC PANEL (CC13)
ALT: 17 U/L (ref 0–55)
AST: 21 U/L (ref 5–34)
Albumin: 4.2 g/dL (ref 3.5–5.0)
Alkaline Phosphatase: 53 U/L (ref 40–150)
Anion Gap: 13 mEq/L — ABNORMAL HIGH (ref 3–11)
BUN: 20 mg/dL (ref 7.0–26.0)
CO2: 24 mEq/L (ref 22–29)
Calcium: 10 mg/dL (ref 8.4–10.4)
Chloride: 104 mEq/L (ref 98–109)
Creatinine: 1 mg/dL (ref 0.6–1.1)
Glucose: 107 mg/dl (ref 70–140)
Potassium: 3.8 mEq/L (ref 3.5–5.1)
Sodium: 141 mEq/L (ref 136–145)
Total Bilirubin: 0.42 mg/dL (ref 0.20–1.20)
Total Protein: 8 g/dL (ref 6.4–8.3)

## 2013-02-15 NOTE — Addendum Note (Signed)
Encounter addended by: Marye Round, MD on: 02/15/2013 12:56 PM<BR>     Documentation filed: Notes Section

## 2013-02-15 NOTE — Progress Notes (Signed)
ID: KRYSTIANA FORNES   DOB: 1928-08-17  MR#: [id number]  YTK#:[id number]  PCP: Milagros Evener, MD GYN: SURolm Bookbinder OTHER FT:DDUKGUR [name]  HISTORY OF PRESENT ILLNESS: The patient had routine screening mammography 12/15/2010 at [facility]. She has heterogeneously dense breast tissue. There was a suggestion of a new mass in the upper inner quadrant of the right breast and she was brought back November 16 for right diagnostic mammography and ultrasonography. Spot compression views confirmed a 1.8 cm multilobulated mass which by ultrasound measured 2.1 cm, was ill-defined and hypoechoic. Biopsies November 19 ([id number]) showed an invasive ductal carcinoma, grade 2, strongly e-cadherin positive, estrogen receptor 90% positive, progesterone receptor negative, with no HER to amplification by CISH. The MIB-1 was 25%.  The patient is not a candidate for MRI because she has a pacemaker in place. Accordingly she underwent BSGI, showing a solitary focus of increased uptake, measuring 2.3 cm. With this information she was seen at the [facility] and a recommendation tomproceed with surgery was made. She had a Right simple mastectomy 01/15/2011. Her subsequent history is as detailed below.   INTERVAL HISTORY: Ms. Rape returns today for followup of her breast cancer accompanied by her husband [name] and her daughter [name].. Since her last visit here the patient had a second stroke. She underwent rehabilitation for that, and is much improved, although limited by her parkinsonism's. More relevance to her visit here, she was found to have a mass in the medial aspect of her right mastectomy scar. Biopsy of this area under ultrasound at [facility] 01-2013 showed ([id number]) an invasive ductal carcinoma, grade 1 or 2, involving soft tissue. Estrogen receptor was 100% positive with strong staining intensity. Progesterone receptor was negative. The MIB-1 was 20%. There was no HER-2 amplification with a signals ratio of 1.10 and  the number her cell being 2.30.  On 01/19/2013 the patient underwent excision of this mass, with the final pathology ([id number]) showing adenocarcinoma, with attached skeletal muscle. The deep margin was negative but less than 1 mm from tumor. The patient has met with Dr. Kyung Rudd to discuss radiation therapy. She is here today to discuss further evaluation and systemic treatment.  REVIEW OF SYSTEMS: Haydan mostly stays around the house. She tells me she cooks, does laundry, and even a little iron and. Her husband however tells me that she spends about half the day in bed. She goes to bed at 9:30 in the evening, gets up about 12 also later, and then takes a nap or 2 during the day. The patient complains of pain at the surgical site, which is mild, and some muscle aches here and there. The family tells me she had another stroke in July, was hospitalized for a month, and received extensive rehabilitation. There have been no recent falls. She denies headaches, seizures, or visual changes. A detailed review of systems was otherwise stable  PAST MEDICAL HISTORY: Past Medical History  Diagnosis Date  . Sick sinus syndrome     with pacemaker and ablation  . Glaucoma   . Seasonal allergic rhinitis     Pos Skin Test 07-05-07  . GERD (gastroesophageal reflux disease)   . Atrial fibrillation   . Hypertension   . Hyperlipidemia   . Cancer bREAST CA(12/2010)  . Chronic kidney disease TUMOR ON RIGHT KIDNEY-OBSERVING  . Arthritis KNEES &BACK  . Neuromuscular disorder SLIGHT TREMOR.  . Bleeding from breast     right  . Pacemaker   . Pericarditis     now "  fluid around heart"  . Stroke 2010, 2014    x2, "big one in 2010"  . Seizures 2013    "think had one"  . Parkinsonism   . Headache(784.0)     sinus headaches  . Compression fracture a long time ago    "back fractured when heart stopped"  . Balance problem   . Orthostatic hypotension   . Light headedness   . CVA 05/07/2008    Qualifier: History  of  By: Annamaria Boots MD, Clinton D     PAST SURGICAL HISTORY: Past Surgical History  Procedure Laterality Date  . Cholecystectomy    . Vesicovaginal fistula closure w/ tah    . Atrial ablation surgery    . Flexible bronchoscopy w/ upper endoscopy      and swallowing evaluation  . Pacemaker insertion    . Knee arthroscopy Right   . Cataract extraction Bilateral   . Abdominal hysterectomy  1975    pt. unsure if laparascopic/vaginal/abdominal  . Breast surgery  01/2011    mastectomy  . Insert / replace / remove pacemaker  01/2007    FOLLOWED BY [facility], 3rd pacemaker  . Mass excision Right 01/19/2013    Procedure: EXCISION CHEST WALL MASS;  Surgeon: [name], MD;  Location: [facility];  Service: General;  Laterality: Right;    FAMILY HISTORY Family History  Problem Relation Age of Onset  . Asthma Brother   . Asthma Daughter   . Heart disease    . Cancer    . Heart failure Mother   . Cancer Father     brain  . Lymphoma Brother   . Diabetes Brother   . Hypertension Brother   The patient's father died at the age of 58 from central nervous system cancer. The patient's mother died at the age of 59. The patient had one brother who had a history of lymphoma, but survives.  GYNECOLOGIC HISTORY: GX P2. 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 Her husband [name] used to work for [employer]. Daughter [name] lives in [place] and is a retired Merchant navy officer. [name] is a widow of my former patient "[name]" [name] Daughter Jennetta Flood lives in [place] where she works as a Orthoptist. The patient has 2 grandsons. She is not a Ambulance person.    ADVANCED DIRECTIVES: in place  HEALTH MAINTENANCE: History  Substance Use Topics  . Smoking status: Never Smoker   . Smokeless tobacco: Never Used   . Alcohol Use: No     Colonoscopy:  PAP:  Bone density: SOLIS Jan 2013, T - 1.7 R fem neck  Lipid panel:  Not on File  Current Outpatient Prescriptions  Medication Sig Dispense Refill  . acetaminophen (TYLENOL) 500 MG tablet Take 1,000 mg by mouth every 6 (six) hours as needed. For pain.      . benzonatate (TESSALON) 100 MG capsule Take 100 mg by mouth 3 (three) times daily as needed for cough.      . Calcium Carbonate-Vitamin D (CALCIUM-VITAMIN D) 500-200 MG-UNIT per tablet Take 1 tablet by mouth daily.       . cephALEXin (KEFLEX) 250 MG capsule Take 1 capsule by mouth at bedtime.       . colestipol (COLESTID) 1 G tablet Take 1 g by mouth at bedtime.      Marland Kitchen  furosemide (LASIX) 40 MG tablet Take 40 mg by mouth every morning.      . levETIRAcetam (KEPPRA) 500 MG tablet Take 250 mg by mouth every 12 (twelve) hours.      . Multiple Vitamin (MULTIVITAMIN WITH MINERALS) TABS tablet Take 1 tablet by mouth daily.      . pantoprazole (PROTONIX) 40 MG tablet Take 1 tablet by mouth daily.      . potassium chloride SA (K-DUR,KLOR-CON) 20 MEQ tablet Take 1 tablet by mouth at bedtime.       . pravastatin (PRAVACHOL) 80 MG tablet Take 80 mg by mouth at bedtime.      . Probiotic Product (ALIGN) 4 MG CAPS Take 4 mg by mouth daily.  30 capsule  0  . propranolol (INDERAL) 60 MG tablet Take 30 mg by mouth 2 (two) times daily. 1/2 tablet twice daily      . RESTASIS 0.05 % ophthalmic emulsion Place 1 drop into both eyes every 12 (twelve) hours.       Marland Kitchen warfarin (COUMADIN) 2.5 MG tablet Take 1.25-2.5 mg by mouth daily. Takes 1 tablet on Monday ,wednesday and Friday and 1/2 tablet all other days       No current facility-administered medications for this visit.    OBJECTIVE: Elderly white female with some movement rigidity but is able to change by herself and to get on the examining table with minimal assistance Filed Vitals:   02/15/13 1402  BP: 136/84  Pulse: 78  Temp: 98.1 F (36.7 C)  Resp: 18      Body mass index is 26.65 kg/(m^2).    ECOG FS: 2  Sclerae unicteric, pupils equal and reactive Oropharynx no thrush or other lesions No cervical or supraclavicular adenopathy Lungs no rales or rhonchi, fair excursion bilaterally Heart regular rate and rhythm (paced) Abd soft, nontender, positive bowel sounds MSK no edema; no focal spinal tenderness Neuro: Nonfocal; mild facial rigidity; moderate shuffling gait, She does not appear depressed, speaks in full sentences, is well oriented. Breasts: The right breast is status post mastectomy. In the medial aspect of the incision there are some Steri-Strips indicating where the new excision took place. There is no dehiscence, erythema, swelling, or unusual tenderness. Her pacemaker is in the right chest wall. The right axilla is benign. Left breast is unremarkable.  LAB RESULTS: Lab Results  Component Value Date   WBC 7.3 02/15/2013   NEUTROABS 4.4 02/15/2013   HGB 13.3 02/15/2013   HCT 38.7 02/15/2013   MCV 86.6 02/15/2013   PLT 192 02/15/2013      Chemistry      Component Value Date/Time   NA 140 01/20/2013 0615   NA 140 03/23/2012 1329   K 3.6 01/20/2013 0615   K 3.7 03/23/2012 1329   CL 106 01/20/2013 0615   CL 104 03/23/2012 1329   CO2 24 01/20/2013 0615   CO2 27 03/23/2012 1329   BUN 11 01/20/2013 0615   BUN 17.9 03/23/2012 1329   CREATININE 0.74 01/20/2013 0615   CREATININE 1.0 03/23/2012 1329      Component Value Date/Time   CALCIUM 8.8 01/20/2013 0615   CALCIUM 9.7 03/23/2012 1329   ALKPHOS 41 08/23/2012 1928   ALKPHOS 38* 03/23/2012 1329   AST 23 08/23/2012 1928   AST 20 03/23/2012 1329   ALT 13 08/23/2012 1928   ALT 15 03/23/2012 1329   BILITOT 0.3 08/23/2012 1928   BILITOT 0.44 03/23/2012 1329       Lab Results  Component Value Date   LABCA2 15 12/30/2010    No components found with this basename: [id number]     Recent Labs Lab 02/09/13 1431  INR 3.0    Urinalysis    Component Value Date/Time   COLORURINE  YELLOW 08/23/2012 2052   APPEARANCEUR CLOUDY* 08/23/2012 2052   LABSPEC 1.010 08/23/2012 2052   PHURINE 7.0 08/23/2012 2052   GLUCOSEU NEGATIVE 08/23/2012 2052   HGBUR NEGATIVE 08/23/2012 2052   BILIRUBINUR NEGATIVE 08/23/2012 2052   KETONESUR NEGATIVE 08/23/2012 2052   PROTEINUR 30* 08/23/2012 2052   UROBILINOGEN 0.2 08/23/2012 2052   NITRITE NEGATIVE 08/23/2012 2052   LEUKOCYTESUR MODERATE* 08/23/2012 2052    STUDIES: Radiologic studies reviewed with patient and family  ASSESSMENT: 80 y.o. [place] woman status post simple mastectomy 01/15/2011 for a T2 NX (stage II) invasive ductal carcinoma, grade 3, 100% estrogen receptor positive, with a borderline MIB-1, progesterone receptor and HER-2 negative.  (1) not a candidate for tamoxifen due to cardiac issues and decided against aromatase inhibitors because of osteopenia present at baseline and concerns re. bisphosphonates  (2) local recurrence to the right chest wall documented by biopsy 01-2013 showing an invasive ductal carcinoma, grade 1 or 2, estrogen receptor strongly positive, progesterone receptor and HER-2 negative, with an MIB-1 of 28%.  (3) excision of the right chest wall mass 01/19/2013 with negative but close margins (the deep margin is skeletal muscle).   PLAN: Devany's breast cancer has recurred locally. Hopefully this is not accompanied by systemic recurrence, but we do have to evaluate for that and accordingly I have scheduled her for a chest CT scan and a bone scan. Per Dr. Ida Rogue note, optimal local treatment would involve moving the pacemaker to the contralateral side, but I think in this debilitated anticoagulated patient the better part of valor is to leave the pacemaker in place and accept a more limited port for the radiation treatments  Form of systemic therapy point of view tamoxifen is not a choice given her comorbidities. I think she would be a good candidate for anastrozole. She had moderate osteopenia in a bone density  2 years ago, and after her she has been on anastrozole 2-3 months, to make sure she can tolerated, we will repeat that study. There has been significant bone loss we can try a bisphosphonate or denosumab.  She will see me again on January 26, and she will see Dr. Lisbeth Renshaw the same day. [employer] has a good understanding of the overall plan, and agrees with it. She will call with any problems that may develop before her next visit here.     Clydene Burack C    02/15/2013

## 2013-02-15 NOTE — Telephone Encounter (Signed)
, °

## 2013-02-15 NOTE — Progress Notes (Addendum)
Radiation Oncology         [phone] ________________________________  Name: Debra Lynn MRN: [id number]  Date: 02/14/2013  DOB: 01/04/1929  Follow-Up Visit Note  CC: RANKINS,VICTORIA, MD  Wakefield, Matthew, MD  Diagnosis:   Recurrent right-sided breast cancer  Interval Since Last Radiation:  Not applicable   Narrative:  The patient returns today for followup given her diagnosis of recurrent right-sided breast cancer. The patient underwent a right simple mastectomy in December of 2012. This showed a 2.2 cm tumor with clear margins. No lymph node sampling at that time. The patient was not felt to a good candidate for postmastectomy radiation treatment given her staging.  The patient however most recently noted a mass which developed along the medial aspect of the surgical incision. This was biopsied and returned positive for invasive ductal carcinoma. The patient therefore underwent an excision of this mass through Dr. Wakefield which was completed on 01/19/2013. This again showed breast carcinoma with foci suspicious for lymphatic vascular involvement. The tumor was focally less than 0.1 cm from the deep margin of the specimen. The margins however were negative. Dr. Wakefield indicates that he did take the excision down to the rib in the posterior direction. Receptor studies have indicated that the recurrent tumor is estrogen positive, progesterone negative, and HER-2/neu negative.                  ALLERGIES:  has no allergies on file.  Meds: Current Outpatient Prescriptions  Medication Sig Dispense Refill  . acetaminophen (TYLENOL) 500 MG tablet Take 1,000 mg by mouth every 6 (six) hours as needed. For pain.      . benzonatate (TESSALON) 100 MG capsule Take 100 mg by mouth 3 (three) times daily as needed for cough.      . Calcium Carbonate-Vitamin D (CALCIUM-VITAMIN D) 500-200 MG-UNIT per tablet Take 1 tablet by mouth daily.       . cephALEXin (KEFLEX) 250 MG capsule Take 1 capsule  by mouth at bedtime.       . colestipol (COLESTID) 1 G tablet Take 1 g by mouth at bedtime.      . furosemide (LASIX) 40 MG tablet Take 40 mg by mouth every morning.      . levETIRAcetam (KEPPRA) 500 MG tablet Take 250 mg by mouth every 12 (twelve) hours.      . Multiple Vitamin (MULTIVITAMIN WITH MINERALS) TABS tablet Take 1 tablet by mouth daily.      . pantoprazole (PROTONIX) 40 MG tablet Take 1 tablet by mouth daily.      . potassium chloride SA (K-DUR,KLOR-CON) 20 MEQ tablet Take 1 tablet by mouth at bedtime.       . pravastatin (PRAVACHOL) 80 MG tablet Take 80 mg by mouth at bedtime.      . Probiotic Product (ALIGN) 4 MG CAPS Take 4 mg by mouth daily.  30 capsule  0  . propranolol (INDERAL) 60 MG tablet Take 30 mg by mouth 2 (two) times daily. 1/2 tablet twice daily      . RESTASIS 0.05 % ophthalmic emulsion Place 1 drop into both eyes every 12 (twelve) hours.       . warfarin (COUMADIN) 2.5 MG tablet Take 1.25-2.5 mg by mouth daily. Takes 1 tablet on Monday ,wednesday and Friday and 1/2 tablet all other days       No current facility-administered medications for this encounter.    Physical Findings: The patient is in no acute distress. Patient is   alert and oriented. General: Well-developed, in no acute distress HEENT: Normocephalic, atraumatic Cardiovascular: Regular rate and rhythm Respiratory: Clear to auscultation bilaterally The patient is status post mastectomy on the right. The surgical incision medially is healing well. No sign of infection. No nodularity. No axillary adenopathy on the right. GI: Soft, nontender, normal bowel sounds Extremities: No edema present   Lab Findings: Lab Results  Component Value Date   WBC 6.7 01/15/2013   HGB 12.6 01/15/2013   HCT 37.6 01/15/2013   MCV 87.0 01/15/2013   PLT 165 01/15/2013     Radiographic Findings: No results found.  Impression:    The patient has recurrent breast cancer on the right. She previously underwent a  mastectomy and more recently has undergone local excision. This achieved negative margins which were close but given the depth of the surgery which did remove the tissue down to the rib, this margin should be fine. The patient is seeing medical oncology later this week to discuss possible systemic treatment options.  NCCN guidelines recommend postmastectomy radiation treatment at this time. This would target the chest wall and regional lymph nodes. This would consist of 6-1/2 weeks of treatment. I discussed the benefit of such treatment in terms of local/regional control. We also discussed the potential side effects and risks of treatment.  1 complicating factor for the patient's case is the presence of a pacemaker within the right chest. This would prohibit comprehensive radiation treatment as I have outlined which would be standard of care. More limited radiation treatment to the mastectomy scar could be given with some expected benefit in terms of local control, less than the full benefit from treating a broader area. I will contact the patient's cardiologist to see about the potential feasibility of moving the pacemaker to the contralateral side. This would be ideal from a cancer treatment perspective, but certainly is a nontrivial issue.  I believe also that the patient needs restaging studies at this time. This will likely be coordinated by medical oncology.  Plan:  Depending on the results of her restaging studies and discussions with cardiology, I will bring the patient back to clinic at the appropriate time for possible radiation treatment.  I spent 30 minutes with the patient today, the majority of which was spent counseling the patient on the diagnosis of cancer and coordinating care.    S. , M.D., Ph.D.      

## 2013-02-21 ENCOUNTER — Ambulatory Visit (HOSPITAL_COMMUNITY)
Admission: RE | Admit: 2013-02-21 | Discharge: 2013-02-21 | Disposition: A | Discharge status: Home / Self Care | Payer: Medicare Other | Source: Ambulatory Visit | Attending: Oncology | Admitting: Oncology

## 2013-02-21 ENCOUNTER — Encounter (HOSPITAL_COMMUNITY)
Admission: RE | Admit: 2013-02-21 | Discharge: 2013-02-21 | Disposition: A | Discharge status: Home / Self Care | Payer: Medicare Other | Source: Ambulatory Visit | Attending: Oncology | Admitting: Oncology

## 2013-02-21 DIAGNOSIS — R918 Other nonspecific abnormal finding of lung field: Secondary | ICD-10-CM | POA: Insufficient documentation

## 2013-02-21 DIAGNOSIS — C50919 Malignant neoplasm of unspecified site of unspecified female breast: Secondary | ICD-10-CM | POA: Insufficient documentation

## 2013-02-21 DIAGNOSIS — Z9010 Acquired absence of unspecified breast and nipple: Secondary | ICD-10-CM | POA: Insufficient documentation

## 2013-02-21 DIAGNOSIS — I77819 Aortic ectasia, unspecified site: Secondary | ICD-10-CM | POA: Insufficient documentation

## 2013-02-21 DIAGNOSIS — J9819 Other pulmonary collapse: Secondary | ICD-10-CM | POA: Insufficient documentation

## 2013-02-21 DIAGNOSIS — J984 Other disorders of lung: Secondary | ICD-10-CM | POA: Insufficient documentation

## 2013-02-21 DIAGNOSIS — I2510 Atherosclerotic heart disease of native coronary artery without angina pectoris: Secondary | ICD-10-CM | POA: Insufficient documentation

## 2013-02-21 MED ORDER — IOHEXOL 300 MG/ML  SOLN
80.0000 mL | Freq: Once | INTRAMUSCULAR | Status: AC | PRN
  Administered 2013-02-21: 80 mL via INTRAVENOUS

## 2013-02-21 MED ORDER — TECHNETIUM TC 99M MEDRONATE IV KIT
25.0000 | PACK | Freq: Once | INTRAVENOUS | Status: AC | PRN
Start: 2013-02-21 — End: 2013-02-21
  Administered 2013-02-21: 25 via INTRAVENOUS

## 2013-02-22 ENCOUNTER — Ambulatory Visit: Payer: Medicare Other

## 2013-02-23 ENCOUNTER — Telehealth: Payer: Self-pay | Admitting: *Deleted

## 2013-02-23 ENCOUNTER — Ambulatory Visit (INDEPENDENT_AMBULATORY_CARE_PROVIDER_SITE_OTHER): Payer: Medicare Other

## 2013-02-23 DIAGNOSIS — J309 Allergic rhinitis, unspecified: Secondary | ICD-10-CM

## 2013-02-23 NOTE — Telephone Encounter (Signed)
Daughter called asking if Dr.Allred had talked to her mother's oncologist. I informed her that per Claiborne Billings [facility] nurse) she did not believe he did. Daughter was satisfied with this and stated that she would get it straightened out when she sees the oncologist on Monday. I asked if there was anything else I could do to help. Daughter declined and voiced her appreciation for the information given.

## 2013-02-23 NOTE — Telephone Encounter (Signed)
error 

## 2013-02-23 NOTE — Telephone Encounter (Signed)
Called Ms.Grant Ruts, patient's daughter per her request, and discussed with her telephone conversation to her mother earlier today,will see her Monday at 48pm, "MY mother has had 2 strokes and still gets confused ,please call me first  With any concerns or questions", shw was told by Dr.Magrinat moving the pacemaker shouldn't have to be done " they are seeing Dr.Magrinat first before seeing Dr.moody on Monday she will be her at this appt 4:28 PM -

## 2013-02-23 NOTE — Telephone Encounter (Signed)
Received vm from pt's daughter, Gwenyth Bender stating her mother received call from a nurse re: pacemaker. Ms Grant Ruts requests all calls be made to her because pt has had 2 strokes and may not understand information correctly. Ms Grant Ruts requests call back from Raenette Rover RN who spoke w/her mother earlier today. Informed Ms Renita Papa RN is not in office at this time, but is to return approximately 4 pm. Will route her request to RN for call back today.  Ms Grant Ruts verbalized understanding. Ms Grant Ruts can be reached at [phone].

## 2013-02-23 NOTE — Telephone Encounter (Signed)
Called patient at home, per patient she does not want to have her pacemaker moved from her right chest to the left side , she wants to proceed and see Dr.moody this Monday , thanked her and will inform Dr.moody and see pateint on Monday as scheduled 2:26 PM

## 2013-02-26 ENCOUNTER — Encounter: Payer: Self-pay | Admitting: Internal Medicine

## 2013-02-26 ENCOUNTER — Ambulatory Visit
Admission: RE | Admit: 2013-02-26 | Discharge: 2013-02-26 | Disposition: A | Discharge status: Home / Self Care | Payer: Medicare Other | Source: Ambulatory Visit | Attending: Radiation Oncology | Admitting: Radiation Oncology

## 2013-02-26 ENCOUNTER — Ambulatory Visit (HOSPITAL_BASED_OUTPATIENT_CLINIC_OR_DEPARTMENT_OTHER): Payer: Medicare Other | Admitting: Oncology

## 2013-02-26 ENCOUNTER — Telehealth: Payer: Self-pay | Admitting: *Deleted

## 2013-02-26 VITALS — BP 124/79 | HR 67 | Temp 97.9°F | Resp 18 | Ht 63.0 in | Wt 151.7 lb

## 2013-02-26 DIAGNOSIS — Z950 Presence of cardiac pacemaker: Secondary | ICD-10-CM | POA: Insufficient documentation

## 2013-02-26 DIAGNOSIS — C7989 Secondary malignant neoplasm of other specified sites: Secondary | ICD-10-CM

## 2013-02-26 DIAGNOSIS — C50219 Malignant neoplasm of upper-inner quadrant of unspecified female breast: Secondary | ICD-10-CM

## 2013-02-26 DIAGNOSIS — M899 Disorder of bone, unspecified: Secondary | ICD-10-CM

## 2013-02-26 DIAGNOSIS — J984 Other disorders of lung: Secondary | ICD-10-CM | POA: Insufficient documentation

## 2013-02-26 DIAGNOSIS — C50919 Malignant neoplasm of unspecified site of unspecified female breast: Secondary | ICD-10-CM

## 2013-02-26 DIAGNOSIS — M949 Disorder of cartilage, unspecified: Secondary | ICD-10-CM

## 2013-02-26 DIAGNOSIS — R918 Other nonspecific abnormal finding of lung field: Secondary | ICD-10-CM

## 2013-02-26 DIAGNOSIS — I2510 Atherosclerotic heart disease of native coronary artery without angina pectoris: Secondary | ICD-10-CM | POA: Insufficient documentation

## 2013-02-26 DIAGNOSIS — I4891 Unspecified atrial fibrillation: Secondary | ICD-10-CM

## 2013-02-26 DIAGNOSIS — I77810 Thoracic aortic ectasia: Secondary | ICD-10-CM | POA: Insufficient documentation

## 2013-02-26 DIAGNOSIS — Z79899 Other long term (current) drug therapy: Secondary | ICD-10-CM | POA: Insufficient documentation

## 2013-02-26 DIAGNOSIS — C779 Secondary and unspecified malignant neoplasm of lymph node, unspecified: Secondary | ICD-10-CM

## 2013-02-26 DIAGNOSIS — Z9010 Acquired absence of unspecified breast and nipple: Secondary | ICD-10-CM | POA: Insufficient documentation

## 2013-02-26 MED ORDER — ANASTROZOLE 1 MG PO TABS: 1.0000 mg | ORAL_TABLET | Freq: Every day | ORAL | Status: DC

## 2013-02-26 NOTE — Progress Notes (Signed)
ID: Debra Lynn   DOB: 1928-11-19  MR#: [id number]  XBJ#:[id number]  PCP: Debra Evener, Lynn GYN: [place]bra [place] OTHER HY:Debra [place]  HISTORY OF PRESENT ILLNESS: The patient had routine screening mammography 12/15/2010 at [facility]. She has heterogeneously dense breast tissue. There was a suggestion of a new mass in the upper inner quadrant of the right breast and she was brought back November 16 for right diagnostic mammography and ultrasonography. Spot compression views confirmed a 1.8 cm multilobulated mass which by ultrasound measured 2.1 cm, was ill-defined and hypoechoic. Biopsies November 19 ([id number]) showed an invasive ductal carcinoma, grade 2, strongly e-cadherin positive, estrogen receptor 90% positive, progesterone receptor negative, with no HER to amplification by CISH. The MIB-1 was 25%.  The patient is not a candidate for MRI because she has a pacemaker in place. Accordingly she underwent BSGI, showing a solitary focus of increased uptake, measuring 2.3 cm. With this information she was seen at the [facility] and a recommendation tomproceed with surgery was made. She had a Right simple mastectomy 01/15/2011. Her subsequent history is as detailed below.   INTERVAL HISTORY: Debra [place] returns today for followup of her breast cancer accompanied by her husband Debra [place] and her Debra [place].Debra [place] Kitchen the interval history is significant for the patient having met with radiation oncology, and also having had a CT scan of the chest and a bone scan. The bone scan is negative as far as breast cancer is concerned. The CT scan likely is negative as well, but there are multiple small bilateral pulmonary lesions, some previously noted, which in my view are unlikely to represent metastatic disease, but certainly that cannot be ruled out by radiology alone. All this was discussed extensively with the patient today  REVIEW OF SYSTEMS: [place]bra [place] is generally doing "pretty good". She tells me she is low and only  does one thing at a time but she gets many things done during the day, mostly around the house. The family has been concerned by the suggestion from radiation oncology that for optimal radiation treatments she would need to have her port removed. They are afraid she may have a stroke or bleeding problems from that procedure. Their adamant that they do not want to move it and given the overall situation and I think this is not unreasonable. Otherwise a detailed review of systems today was stable.  PAST MEDICAL HISTORY: Past Medical History  Diagnosis Date  . Sick sinus syndrome     with pacemaker and ablation  . Glaucoma   . Seasonal allergic rhinitis     Pos Skin Test 07-05-07  . GERD (gastroesophageal reflux disease)   . Atrial fibrillation   . Hypertension   . Hyperlipidemia   . Cancer bREAST CA(12/2010)  . Chronic kidney disease TUMOR ON RIGHT KIDNEY-OBSERVING  . Arthritis KNEES &BACK  . Neuromuscular disorder SLIGHT TREMOR.  . Bleeding from breast     right  . Pacemaker   . Pericarditis     now "fluid around heart"  . Stroke 2010, 2014    x2, "big one in 2010"  . Seizures 2013    "think had one"  . Parkinsonism   . Headache(784.0)     sinus headaches  . Compression fracture a long time ago    "back fractured when heart stopped"  . Balance problem   . Orthostatic hypotension   . Light headedness   . CVA 05/07/2008    Qualifier: History of  By: [place]bra [place], Debra Lynn     PAST  SURGICAL HISTORY: Past Surgical History  Procedure Laterality Date  . Cholecystectomy    . Vesicovaginal fistula closure w/ tah    . Atrial ablation surgery    . Flexible bronchoscopy w/ upper endoscopy      and swallowing evaluation  . Pacemaker insertion    . Knee arthroscopy Right   . Cataract extraction Bilateral   . Abdominal hysterectomy  1975    pt. unsure if laparascopic/vaginal/abdominal  . Breast surgery  01/2011    mastectomy  . Insert / replace / remove pacemaker  01/2007    FOLLOWED  BY [place]; Debra [place], 3rd pacemaker  . Mass excision Right 01/19/2013    Procedure: EXCISION CHEST WALL MASS;  Surgeon: Debra Bookbinder, Lynn;  Location: [facility];  Service: General;  Laterality: Right;    FAMILY HISTORY Family History  Problem Relation Age of Onset  . Asthma Brother   . Asthma Debra   . Heart disease    . Cancer    . Heart failure Mother   . Cancer Father     brain  . Lymphoma Brother   . Diabetes Brother   . Hypertension Brother   The patient's father died at the age of 7 from central nervous system cancer. The patient's mother died at the age of 82. The patient had one brother who had a history of lymphoma, but survives.  GYNECOLOGIC HISTORY: GX P2. 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 Her husband [place]bra [place] used to work for [employer]. [place] lives in [place] and is a retired Merchant navy officer. 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 The patient has 2 grandsons. She is not a Ambulance person.    ADVANCED DIRECTIVES: in place  HEALTH MAINTENANCE: History  Substance Use Topics  . Smoking status: Never Smoker   . Smokeless tobacco: Never Used  . Alcohol Use: No     Colonoscopy:  PAP:  Bone density: SOLIS Jan 2013, T - 1.7 R fem neck  Lipid panel:  Not on File  Current Outpatient Prescriptions  Medication Sig Dispense Refill  . acetaminophen (TYLENOL) 500 MG tablet Take 1,000 mg by mouth every 6 (six) hours as needed. For pain.      . benzonatate (TESSALON) 100 MG capsule Take 100 mg by mouth 3 (three) times daily as needed for cough.      . Calcium Carbonate-Vitamin D (CALCIUM-VITAMIN D) 500-200 MG-UNIT per tablet Take 1 tablet by mouth daily.       . cephALEXin (KEFLEX)  250 MG capsule Take 1 capsule by mouth at bedtime.       . colestipol (COLESTID) 1 G tablet Take 1 g by mouth at bedtime.      . furosemide (LASIX) 40 MG tablet Take 40 mg by mouth every morning.      . levETIRAcetam (KEPPRA) 500 MG tablet Take 250 mg by mouth every 12 (twelve) hours.      . Multiple Vitamin (MULTIVITAMIN WITH MINERALS) TABS tablet Take 1 tablet by mouth daily.      . pantoprazole (PROTONIX) 40 MG tablet Take 1 tablet by mouth daily.      . potassium chloride SA (K-DUR,KLOR-CON) 20 MEQ tablet Take 1 tablet by mouth at bedtime.       [place] Kitchen  pravastatin (PRAVACHOL) 80 MG tablet Take 80 mg by mouth at bedtime.      . Probiotic Product (ALIGN) 4 MG CAPS Take 4 mg by mouth daily.  30 capsule  0  . propranolol (INDERAL) 60 MG tablet Take 30 mg by mouth 2 (two) times daily. 1/2 tablet twice daily      . RESTASIS 0.05 % ophthalmic emulsion Place 1 drop into both eyes every 12 (twelve) hours.       [place] Kitchen warfarin (COUMADIN) 2.5 MG tablet Take 1.25-2.5 mg by mouth daily. Takes 1 tablet on Monday ,wednesday and Friday and 1/2 tablet all other days       No current facility-administered medications for this visit.    OBJECTIVE: Elderly white female who appears stated age 80 Vitals:   02/26/13 1543  BP: 124/79  Pulse: 67  Temp: 97.9 F (36.6 C)  Resp: 18     Body mass index is 26.88 kg/(m^2).    ECOG FS: 2  Sclerae unicteric, bilateral arcus senilis Oropharynx no thrush or other lesions; teeth in good repair No cervical or supraclavicular adenopathy Lungs no rales or rhonchi, fair excursion bilaterally Heart regular rate and rhythm (paced) Abd soft, nontender, positive bowel sounds MSK no edema; no focal spinal tenderness Neuro: Nonfocal; mild facial rigidity; moderate shuffling gait, She does not appear depressed, speaks slowly but in full sentences, is well oriented. Affect is pleasant Breasts: The right breast is status post mastectomy. The medial aspect of the incision, where the  recurrence was resected, is healing very nicely, with no erythema, swelling, unusual tenderness, or evidence of residual or recurrent disease.. The right axilla is benign. Left breast is unremarkable.  LAB RESULTS: Lab Results  Component Value Date   WBC 7.3 02/15/2013   NEUTROABS 4.4 02/15/2013   HGB 13.3 02/15/2013   HCT 38.7 02/15/2013   MCV 86.6 02/15/2013   PLT 192 02/15/2013      Chemistry      Component Value Date/Time   NA 141 02/15/2013 1352   NA 140 01/20/2013 0615   K 3.8 02/15/2013 1352   K 3.6 01/20/2013 0615   CL 106 01/20/2013 0615   CL 104 03/23/2012 1329   CO2 24 02/15/2013 1352   CO2 24 01/20/2013 0615   BUN 20.0 02/15/2013 1352   BUN 11 01/20/2013 0615   CREATININE 1.0 02/15/2013 1352   CREATININE 0.74 01/20/2013 0615      Component Value Date/Time   CALCIUM 10.0 02/15/2013 1352   CALCIUM 8.8 01/20/2013 0615   ALKPHOS 53 02/15/2013 1352   ALKPHOS 41 08/23/2012 1928   AST 21 02/15/2013 1352   AST 23 08/23/2012 1928   ALT 17 02/15/2013 1352   ALT 13 08/23/2012 1928   BILITOT 0.42 02/15/2013 1352   BILITOT 0.3 08/23/2012 1928       Lab Results  Component Value Date   LABCA2 15 12/30/2010    No components found with this basename: [id number]    No results found for this basename: INR,  in the last 168 hours  Urinalysis    Component Value Date/Time   COLORURINE YELLOW 08/23/2012 2052   APPEARANCEUR CLOUDY* 08/23/2012 2052   LABSPEC 1.010 08/23/2012 2052   PHURINE 7.0 08/23/2012 2052   [place] 08/23/2012 2052   HGBUR NEGATIVE 08/23/2012 2052   BILIRUBINUR NEGATIVE 08/23/2012 2052   KETONESUR NEGATIVE 08/23/2012 2052   PROTEINUR 30* 08/23/2012 2052   UROBILINOGEN 0.2 08/23/2012 2052   NITRITE NEGATIVE 08/23/2012 2052  LEUKOCYTESUR MODERATE* 08/23/2012 2052    STUDIES: Ct Chest W Contrast  02/21/2013   CLINICAL DATA:  Restaging of recurrent breast carcinoma. History of a mastectomy.  EXAM: CT CHEST WITH CONTRAST  TECHNIQUE: Multidetector CT imaging of the  chest was performed during intravenous contrast administration.  CONTRAST:  91m OMNIPAQUE IOHEXOL 300 MG/ML  SOLN  COMPARISON:  Chest radiograph, 01/15/2013.  Chest CT, 06/18/2010.  FINDINGS: Status post right mastectomy. No neck base or axillary masses or adenopathy.  Heart is normal in size. There are coronary artery calcifications. Dilation of the ascending aorta is stable. No aortic dissection an apparent right subclavian is also stable.  No mediastinal or hilar masses or pathologically enlarged lymph nodes.  There are small lung nodules. A 5 mm nodule lies along the posterior pleural margin of the superior segment of the right lower lobe. A 7 mm pleural-based nodule lies along the lateral base of the left lower lobe with an adjacent 5 mm nodule. There are several smaller semi-solid nodules noted bilaterally. The nodule in the right lower lobe superior segment was less well-defined previously. The 5 mm nodule in the left lateral lung base and the adjacent 7 mm nodule were both present previously, but have increased in size. Several of the smaller semi-solid nodules were not present on the prior CT.  There is minor lung base subsegmental atelectasis and there are minor areas of stable parenchymal scarring. More notable pleural parenchymal scarring is noted at the apices, unchanged. No pleural effusion.  There is an 11 mm low-density lesion in the lateral segment of the left lobe of the liver. More vague hypo attenuating area was present in this location on the prior study. This may reflect a cyst or hemangioma. Metastatic disease is felt unlikely.  No osteoblastic or osteolytic lesions.  IMPRESSION: 1. Small lung nodules. The more discrete nodules were present previously, but have mildly increased in size since the CT dated 06/18/2010. Smaller semi-solid nodules noted on the current exam were not convincingly present previously. 2. No other evidence suggesting metastatic disease within the chest.    Electronically Signed   By: DLajean ManesM.D.   On: 02/21/2013 14:58   Nm Bone Scan Whole Body  02/21/2013   CLINICAL DATA:  Recurrent right breast cancer.  EXAM: NUCLEAR MEDICINE WHOLE BODY BONE SCAN  TECHNIQUE: Whole body anterior and posterior images were obtained approximately 3 hours after intravenous injection of radiopharmaceutical.  COMPARISON:  Chest CT 02/21/2013  RADIOPHARMACEUTICALS:  25 mCi Technetium-99 MDP  FINDINGS: There is expected uptake in the renal collecting systems and urinary bladder. No suspicious uptake in the axial or appendicular skeleton. Degenerative changes in the right knee. No suspicious uptake in the ribs.  IMPRESSION: No evidence for metastatic bone disease.   Electronically Signed   By: AMarkus DaftM.D.   On: 02/21/2013 16:00    ASSESSMENT: 80y.o. [place] woman status post simple mastectomy 01/15/2011 for a T2 NX (stage II) invasive ductal carcinoma, grade 3, 100% estrogen receptor positive, with a borderline MIB-1, progesterone receptor and HER-2 negative.  (1) not a candidate for tamoxifen due to cardiac issues and decided against aromatase inhibitors because of osteopenia present at baseline and concerns re. bisphosphonates  (2) local recurrence to the right chest wall documented by biopsy 01-2013 showing an invasive ductal carcinoma, grade 1 or 2, estrogen receptor strongly positive, progesterone receptor and HER-2 negative, with an MIB-1 of 28%.  (3) excision of the right chest wall mass 01/19/2013 with negative  but close margins (the deep margin is skeletal muscle).  (4) Ct chest January 2015 shows small bilateral lung lesions many of which are longstanding; repeat chest CT DEC 2015 planned  (5) osteopenia per bone density 1/13 with T -1.5 as lowest score   PLAN: It is very gratifying that Lo scans were negative. I think the multiple small pulmonary lesions are not going to be related to her breast cancer, but they will require followup and we will  repeat a CT of the chest late this year.  Next at this radiation. The family I think understandably does not want to move the pacemaker (SAA R. afraid the patient may have bleeding or stroke with the procedure). I would support radiation on to the involved area to the best of Dr. Lisbeth Renshaw his ability to deliver without changing the pacemaker.  After the patient has recovered from radiation, perhaps in 2 weeks or so, she should start antiestrogen. I have sent a prescription in for her. Today we discussed the possible toxicities, side effects and complications. Gender status this will help prevent local recurrence but more importantly it will help prevent a distant recurrence which could take her life. In fact if we knew that the patient's lung lesions were breast cancer, this is the agent that I would be started her on.  The patient and her family have a good understanding of this plan. They agree to it. They know to call for any problems that may develop before her next visit here.    Corisa Montini C    02/26/2013

## 2013-02-26 NOTE — Telephone Encounter (Signed)
appts made and printed...td 

## 2013-02-26 NOTE — Progress Notes (Signed)
Please see the Nurse Progress Note in the MD Initial Consult Encounter for this patient. 

## 2013-02-27 ENCOUNTER — Ambulatory Visit (INDEPENDENT_AMBULATORY_CARE_PROVIDER_SITE_OTHER): Payer: Medicare Other | Admitting: Pharmacist

## 2013-02-27 DIAGNOSIS — I4891 Unspecified atrial fibrillation: Secondary | ICD-10-CM

## 2013-02-27 DIAGNOSIS — I6789 Other cerebrovascular disease: Secondary | ICD-10-CM

## 2013-02-27 LAB — POCT INR: INR: 2.4

## 2013-02-27 NOTE — Progress Notes (Signed)
Radiation Oncology         [phone] ________________________________  Name: Debra [place] MRN: [id number]  Date: 02/26/2013  DOB: [birth year]  Follow-Up Visit Note  CC: Milagros Evener, MD  Magrinat, Virgie Dad, MD  Diagnosis:   Recurrent right-sided breast carcinoma   Narrative:  The patient returns today for  follow-up.  The patient returns to clinic today for discussion of her treatment plans. The patient has thought about our discussion and has decided not to proceed with moving the pacemaker. She wishes to proceed with radiation treatment to the extent that this can be accomplished without compromising the pacemaker. Otherwise she has been doing well with no new complaints. Healing well from surgery.                              ALLERGIES:  has No Known Allergies.  Meds: Current Outpatient Prescriptions  Medication Sig Dispense Refill  . acetaminophen (TYLENOL) 500 MG tablet Take 1,000 mg by mouth every 6 (six) hours as needed. For pain.      Marland Kitchen anastrozole (ARIMIDEX) 1 MG tablet Take 1 tablet (1 mg total) by mouth daily.  90 tablet  12  . benzonatate (TESSALON) 100 MG capsule Take 100 mg by mouth 3 (three) times daily as needed for cough.      . Calcium Carbonate-Vitamin D (CALCIUM-VITAMIN D) 500-200 MG-UNIT per tablet Take 1 tablet by mouth daily.       . cephALEXin (KEFLEX) 250 MG capsule Take 1 capsule by mouth at bedtime.       . colestipol (COLESTID) 1 G tablet Take 1 g by mouth at bedtime.      . furosemide (LASIX) 40 MG tablet Take 40 mg by mouth every morning.      . levETIRAcetam (KEPPRA) 500 MG tablet Take 250 mg by mouth every 12 (twelve) hours.      . Multiple Vitamin (MULTIVITAMIN WITH MINERALS) TABS tablet Take 1 tablet by mouth daily.      . pantoprazole (PROTONIX) 40 MG tablet Take 1 tablet by mouth daily.      . potassium chloride SA (K-DUR,KLOR-CON) 20 MEQ tablet Take 1 tablet by mouth at bedtime.       . pravastatin (PRAVACHOL) 80 MG tablet Take 80 mg by  mouth at bedtime.      . Probiotic Product (ALIGN) 4 MG CAPS Take 4 mg by mouth daily.  30 capsule  0  . propranolol (INDERAL) 60 MG tablet Take 30 mg by mouth 2 (two) times daily. 1/2 tablet twice daily      . RESTASIS 0.05 % ophthalmic emulsion Place 1 drop into both eyes every 12 (twelve) hours.       Marland Kitchen warfarin (COUMADIN) 2.5 MG tablet Take 1.25-2.5 mg by mouth daily. Takes 1 tablet on Monday ,wednesday and Friday and 1/2 tablet all other days       No current facility-administered medications for this encounter.    Physical Findings: The patient is in no acute distress. Patient is alert and oriented.  vitals were not taken for this visit..     Lab Findings: Lab Results  Component Value Date   WBC 7.3 02/15/2013   HGB 13.3 02/15/2013   HCT 38.7 02/15/2013   MCV 86.6 02/15/2013   PLT 192 02/15/2013     Radiographic Findings: Ct Chest W Contrast  02/21/2013   CLINICAL DATA:  Restaging of recurrent breast carcinoma. History of a  mastectomy.  EXAM: CT CHEST WITH CONTRAST  TECHNIQUE: Multidetector CT imaging of the chest was performed during intravenous contrast administration.  CONTRAST:  21mL OMNIPAQUE IOHEXOL 300 MG/ML  SOLN  COMPARISON:  Chest radiograph, 01/15/2013.  Chest CT, 06/18/2010.  FINDINGS: Status post right mastectomy. No neck base or axillary masses or adenopathy.  Heart is normal in size. There are coronary artery calcifications. Dilation of the ascending aorta is stable. No aortic dissection an apparent right subclavian is also stable.  No mediastinal or hilar masses or pathologically enlarged lymph nodes.  There are small lung nodules. A 5 mm nodule lies along the posterior pleural margin of the superior segment of the right lower lobe. A 7 mm pleural-based nodule lies along the lateral base of the left lower lobe with an adjacent 5 mm nodule. There are several smaller semi-solid nodules noted bilaterally. The nodule in the right lower lobe superior segment was less  well-defined previously. The 5 mm nodule in the left lateral lung base and the adjacent 7 mm nodule were both present previously, but have increased in size. Several of the smaller semi-solid nodules were not present on the prior CT.  There is minor lung base subsegmental atelectasis and there are minor areas of stable parenchymal scarring. More notable pleural parenchymal scarring is noted at the apices, unchanged. No pleural effusion.  There is an 11 mm low-density lesion in the lateral segment of the left lobe of the liver. More vague hypo attenuating area was present in this location on the prior study. This may reflect a cyst or hemangioma. Metastatic disease is felt unlikely.  No osteoblastic or osteolytic lesions.  IMPRESSION: 1. Small lung nodules. The more discrete nodules were present previously, but have mildly increased in size since the CT dated 06/18/2010. Smaller semi-solid nodules noted on the current exam were not convincingly present previously. 2. No other evidence suggesting metastatic disease within the chest.   Electronically Signed   By: Lajean Manes M.D.   On: 02/21/2013 14:58   Nm Bone Scan Whole Body  02/21/2013   CLINICAL DATA:  Recurrent right breast cancer.  EXAM: NUCLEAR MEDICINE WHOLE BODY BONE SCAN  TECHNIQUE: Whole body anterior and posterior images were obtained approximately 3 hours after intravenous injection of radiopharmaceutical.  COMPARISON:  Chest CT 02/21/2013  RADIOPHARMACEUTICALS:  25 mCi Technetium-99 MDP  FINDINGS: There is expected uptake in the renal collecting systems and urinary bladder. No suspicious uptake in the axial or appendicular skeleton. Degenerative changes in the right knee. No suspicious uptake in the ribs.  IMPRESSION: No evidence for metastatic bone disease.   Electronically Signed   By: Markus Daft M.D.   On: 02/21/2013 16:00    Impression:    The patient is appropriate I believe to proceed with postoperative radiation treatment. She has decided  not to move the pacemaker. Therefore I recommend treatment to the mastectomy scar/recent excision scar. This could be accomplished potentially with an electron treatment alone.  We discussed once again the benefit of such a treatment. She is aware that this is a more limited treatment and ideally would be given for recurrent disease. She is comfortable with this decision.  Plan:  The patient will proceed with a simulation in the near future such that began treatment planning. I anticipate treating the patient for approximately 6 weeks.  I spent 15 minutes with the patient today, the majority of which was spent counseling the patient on the diagnosis of cancer and coordinating care.  Jodelle Gross, M.D., Ph.D.

## 2013-02-28 ENCOUNTER — Telehealth: Payer: Self-pay | Admitting: *Deleted

## 2013-02-28 NOTE — Telephone Encounter (Signed)
Called Dr. Eston Mould.Allred  office and asked for correct fax number new fax number is:[phone], faxed form pacemaker to be filled out by MD ,signed and faxed back asap 11:22 AM

## 2013-03-01 ENCOUNTER — Ambulatory Visit
Admission: RE | Admit: 2013-03-01 | Discharge: 2013-03-01 | Disposition: A | Discharge status: Home / Self Care | Payer: Medicare Other | Source: Ambulatory Visit | Attending: Radiation Oncology | Admitting: Radiation Oncology

## 2013-03-01 ENCOUNTER — Ambulatory Visit (INDEPENDENT_AMBULATORY_CARE_PROVIDER_SITE_OTHER): Payer: Medicare Other

## 2013-03-01 ENCOUNTER — Telehealth: Payer: Self-pay | Admitting: *Deleted

## 2013-03-01 DIAGNOSIS — Z510 Encounter for antineoplastic radiation therapy: Secondary | ICD-10-CM | POA: Insufficient documentation

## 2013-03-01 DIAGNOSIS — C50219 Malignant neoplasm of upper-inner quadrant of unspecified female breast: Secondary | ICD-10-CM | POA: Insufficient documentation

## 2013-03-01 DIAGNOSIS — J309 Allergic rhinitis, unspecified: Secondary | ICD-10-CM

## 2013-03-01 NOTE — Telephone Encounter (Signed)
Called Dr.Varanasi office [phone], spoke with Steffanie Dunn, <MD is out of the office yesterday and today, asked to speak with RN covering for his patients, she spoke with Amy,RN, who is working with another MD today, but that Dr.Varanasi was aware of status, AMY RN will get to this form and have it expedited asap , thanked Steffanie Dunn will inform MD Dr.Moody and CT sim RT 3:49 PM

## 2013-03-02 ENCOUNTER — Ambulatory Visit (INDEPENDENT_AMBULATORY_CARE_PROVIDER_SITE_OTHER): Payer: Medicare Other

## 2013-03-02 DIAGNOSIS — J309 Allergic rhinitis, unspecified: Secondary | ICD-10-CM

## 2013-03-02 NOTE — Addendum Note (Signed)
Encounter addended by: Rebecca Eaton, RN on: 03/02/2013 11:04 AM<BR>     Documentation filed: Charges VN

## 2013-03-07 ENCOUNTER — Telehealth: Payer: Self-pay | Admitting: *Deleted

## 2013-03-07 NOTE — Telephone Encounter (Signed)
error 

## 2013-03-08 ENCOUNTER — Ambulatory Visit
Admission: RE | Admit: 2013-03-08 | Discharge: 2013-03-08 | Disposition: A | Discharge status: Home / Self Care | Payer: Medicare Other | Source: Ambulatory Visit | Attending: Radiation Oncology | Admitting: Radiation Oncology

## 2013-03-09 ENCOUNTER — Encounter: Payer: Self-pay | Admitting: Radiation Oncology

## 2013-03-09 ENCOUNTER — Ambulatory Visit (INDEPENDENT_AMBULATORY_CARE_PROVIDER_SITE_OTHER): Payer: Medicare Other

## 2013-03-09 ENCOUNTER — Ambulatory Visit
Admission: RE | Admit: 2013-03-09 | Discharge: 2013-03-09 | Disposition: A | Discharge status: Home / Self Care | Payer: Medicare Other | Source: Ambulatory Visit | Attending: Radiation Oncology | Admitting: Radiation Oncology

## 2013-03-09 VITALS — BP 125/76 | HR 65 | Temp 97.5°F | Resp 20 | Wt 152.2 lb

## 2013-03-09 DIAGNOSIS — C7989 Secondary malignant neoplasm of other specified sites: Principal | ICD-10-CM

## 2013-03-09 DIAGNOSIS — C50919 Malignant neoplasm of unspecified site of unspecified female breast: Secondary | ICD-10-CM

## 2013-03-09 DIAGNOSIS — C50219 Malignant neoplasm of upper-inner quadrant of unspecified female breast: Secondary | ICD-10-CM

## 2013-03-09 DIAGNOSIS — J309 Allergic rhinitis, unspecified: Secondary | ICD-10-CM

## 2013-03-09 MED ORDER — RADIAPLEXRX EX GEL
Freq: Once | CUTANEOUS | Status: AC
  Administered 2013-03-09: 18:00:00 via TOPICAL

## 2013-03-09 NOTE — Progress Notes (Signed)
Department of Radiation Oncology  Phone:  [phone] Fax:        [phone]  Weekly Treatment Note    Name: Debra Lynn Date: 03/09/2013 MRN: [id number] DOB: November 13, 1928   Current dose: 4 Gy  Current fraction: 2   MEDICATIONS: Current Outpatient Prescriptions  Medication Sig Dispense Refill  . acetaminophen (TYLENOL) 500 MG tablet Take 1,000 mg by mouth every 6 (six) hours as needed. For pain.      . benzonatate (TESSALON) 100 MG capsule Take 100 mg by mouth 3 (three) times daily as needed for cough.      . Calcium Carbonate-Vitamin D (CALCIUM-VITAMIN D) 500-200 MG-UNIT per tablet Take 1 tablet by mouth daily.       . cephALEXin (KEFLEX) 250 MG capsule Take 1 capsule by mouth at bedtime.       . colestipol (COLESTID) 1 G tablet Take 1 g by mouth at bedtime.      . furosemide (LASIX) 40 MG tablet Take 40 mg by mouth every morning.      . levETIRAcetam (KEPPRA) 500 MG tablet Take 250 mg by mouth every 12 (twelve) hours.      . Multiple Vitamin (MULTIVITAMIN WITH MINERALS) TABS tablet Take 1 tablet by mouth daily.      . pantoprazole (PROTONIX) 40 MG tablet Take 1 tablet by mouth daily.      . potassium chloride SA (K-DUR,KLOR-CON) 20 MEQ tablet Take 1 tablet by mouth at bedtime.       . pravastatin (PRAVACHOL) 80 MG tablet Take 80 mg by mouth at bedtime.      . Probiotic Product (ALIGN) 4 MG CAPS Take 4 mg by mouth daily.  30 capsule  0  . propranolol (INDERAL) 60 MG tablet Take 30 mg by mouth 2 (two) times daily. 1/2 tablet twice daily      . RESTASIS 0.05 % ophthalmic emulsion Place 1 drop into both eyes every 12 (twelve) hours.       Marland Kitchen warfarin (COUMADIN) 2.5 MG tablet Take 1.25-2.5 mg by mouth daily. Takes 1 tablet on Monday ,wednesday and Friday and 1/2 tablet all other days      . anastrozole (ARIMIDEX) 1 MG tablet Take 1 tablet (1 mg total) by mouth daily.  90 tablet  12   No current facility-administered medications for this encounter.     ALLERGIES: Review of  patient's allergies indicates no known allergies.   LABORATORY DATA:  Lab Results  Component Value Date   WBC 7.3 02/15/2013   HGB 13.3 02/15/2013   HCT 38.7 02/15/2013   MCV 86.6 02/15/2013   PLT 192 02/15/2013   Lab Results  Component Value Date   NA 141 02/15/2013   K 3.8 02/15/2013   CL 106 01/20/2013   CO2 24 02/15/2013   Lab Results  Component Value Date   ALT 17 02/15/2013   AST 21 02/15/2013   ALKPHOS 53 02/15/2013   BILITOT 0.42 02/15/2013     NARRATIVE: Debra Lynn was seen today for weekly treatment management. The chart was checked and the patient's films were reviewed. The patient is doing well in her first week of treatment. No difficulties so far.  PHYSICAL EXAMINATION: weight is 152 lb 3.2 oz (69.037 kg). Her oral temperature is 97.5 F (36.4 C). Her blood pressure is 125/76 and her pulse is 65. Her respiration is 20.      the patient pointed out an area of irritation and that came up on the  upper anterior chest a couple of days ago. This area is red and looks like a pimple that has come up. This looks unchanged today. We will continue to watch this.  ASSESSMENT: The patient is doing satisfactorily with treatment.  PLAN: We will continue with the patient's radiation treatment as planned. Additional shielding will be used for her treatment to minimize the dose to the pacemaker.

## 2013-03-09 NOTE — Progress Notes (Signed)
Weekly rad txcs rt cw 2/30 txs, patient educatin, done, radiaplex and book given, teach back, discussed fatigue,skin iritwtion, pain, diet,increase protein, and encorage water hydration No skin changes, does have a small sore on chest not from radiation,reddened 5:55 PM

## 2013-03-12 ENCOUNTER — Ambulatory Visit
Admission: RE | Admit: 2013-03-12 | Discharge: 2013-03-12 | Disposition: A | Discharge status: Home / Self Care | Payer: Medicare Other | Source: Ambulatory Visit | Attending: Radiation Oncology | Admitting: Radiation Oncology

## 2013-03-12 DIAGNOSIS — I4891 Unspecified atrial fibrillation: Secondary | ICD-10-CM

## 2013-03-13 ENCOUNTER — Ambulatory Visit
Admission: RE | Admit: 2013-03-13 | Discharge: 2013-03-13 | Disposition: A | Discharge status: Home / Self Care | Payer: Medicare Other | Source: Ambulatory Visit | Attending: Radiation Oncology | Admitting: Radiation Oncology

## 2013-03-14 ENCOUNTER — Other Ambulatory Visit: Payer: Self-pay | Admitting: *Deleted

## 2013-03-14 ENCOUNTER — Ambulatory Visit
Admission: RE | Admit: 2013-03-14 | Discharge: 2013-03-14 | Disposition: A | Discharge status: Home / Self Care | Payer: Medicare Other | Source: Ambulatory Visit | Attending: Radiation Oncology | Admitting: Radiation Oncology

## 2013-03-14 MED ORDER — DOXYCYCLINE HYCLATE 100 MG PO TABS: 100.0000 mg | ORAL_TABLET | Freq: Two times a day (BID) | ORAL | Status: DC

## 2013-03-14 NOTE — Telephone Encounter (Signed)
Pt " dropped in " for concern due to a noted boil mid upper chest that is ongoing x 1 week, " could it be MRSA ?"  Per assessment boil is approximately nickel size, raise, redened with a white closed pustule head. Boil is non draining at present.  Per MD review recommendation is to start doxycycline and use hot compresses.  Prescription obtained and ecscribed to pharmacy per verification to send it to [facility] at [facility]

## 2013-03-15 ENCOUNTER — Telehealth: Payer: Self-pay | Admitting: *Deleted

## 2013-03-15 ENCOUNTER — Ambulatory Visit (INDEPENDENT_AMBULATORY_CARE_PROVIDER_SITE_OTHER): Payer: Medicare Other

## 2013-03-15 ENCOUNTER — Ambulatory Visit
Admission: RE | Admit: 2013-03-15 | Discharge: 2013-03-15 | Disposition: A | Discharge status: Home / Self Care | Payer: Medicare Other | Source: Ambulatory Visit | Attending: Radiation Oncology | Admitting: Radiation Oncology

## 2013-03-15 DIAGNOSIS — J309 Allergic rhinitis, unspecified: Secondary | ICD-10-CM

## 2013-03-15 NOTE — Telephone Encounter (Signed)
Called [employer], [employer] rep [phone] to schedule pacemaker check tomorrow after her radiation check per Dr.James Allred speaking with Dr.moody, her treatment time is 436pm for 10 minutes, per Aaron Edelman, he will send someone to [facility] at 430 , Gillis Santa will inform Dr.Moody and Dr.Allred 11:29 AM

## 2013-03-16 ENCOUNTER — Telehealth: Payer: Self-pay | Admitting: Oncology

## 2013-03-16 ENCOUNTER — Telehealth: Payer: Self-pay | Admitting: *Deleted

## 2013-03-16 ENCOUNTER — Ambulatory Visit
Admission: RE | Admit: 2013-03-16 | Discharge: 2013-03-16 | Disposition: A | Discharge status: Home / Self Care | Payer: Medicare Other | Source: Ambulatory Visit | Attending: Radiation Oncology | Admitting: Radiation Oncology

## 2013-03-16 ENCOUNTER — Encounter: Payer: Self-pay | Admitting: Radiation Oncology

## 2013-03-16 VITALS — BP 159/84 | HR 74 | Temp 97.9°F | Resp 20 | Wt 152.4 lb

## 2013-03-16 DIAGNOSIS — C50219 Malignant neoplasm of upper-inner quadrant of unspecified female breast: Secondary | ICD-10-CM

## 2013-03-16 NOTE — Telephone Encounter (Signed)
Gave pt appt for lab and MD for May 2015, cancelled February per pt rqst

## 2013-03-16 NOTE — Progress Notes (Signed)
Department of Radiation Oncology  Phone:  [phone] Fax:        [phone]  Weekly Treatment Note    Name: Debra Lynn Date: 03/16/2013 MRN: [id number] DOB: 1928/08/08   Current dose: 14 Gy  Current fraction: 7   MEDICATIONS: Current Outpatient Prescriptions  Medication Sig Dispense Refill  . acetaminophen (TYLENOL) 500 MG tablet Take 1,000 mg by mouth every 6 (six) hours as needed. For pain.      Marland Kitchen anastrozole (ARIMIDEX) 1 MG tablet Take 1 tablet (1 mg total) by mouth daily.  90 tablet  12  . benzonatate (TESSALON) 100 MG capsule Take 100 mg by mouth 3 (three) times daily as needed for cough.      . Calcium Carbonate-Vitamin D (CALCIUM-VITAMIN D) 500-200 MG-UNIT per tablet Take 1 tablet by mouth daily.       . cephALEXin (KEFLEX) 250 MG capsule Take 1 capsule by mouth at bedtime.       . colestipol (COLESTID) 1 G tablet Take 1 g by mouth at bedtime.      Marland Kitchen doxycycline (VIBRA-TABS) 100 MG tablet Take 1 tablet (100 mg total) by mouth 2 (two) times daily.  20 tablet  1  . furosemide (LASIX) 40 MG tablet Take 40 mg by mouth every morning.      . levETIRAcetam (KEPPRA) 500 MG tablet Take 250 mg by mouth every 12 (twelve) hours.      . Multiple Vitamin (MULTIVITAMIN WITH MINERALS) TABS tablet Take 1 tablet by mouth daily.      . pantoprazole (PROTONIX) 40 MG tablet Take 1 tablet by mouth daily.      . potassium chloride SA (K-DUR,KLOR-CON) 20 MEQ tablet Take 1 tablet by mouth at bedtime.       . pravastatin (PRAVACHOL) 80 MG tablet Take 80 mg by mouth at bedtime.      . Probiotic Product (ALIGN) 4 MG CAPS Take 4 mg by mouth daily.  30 capsule  0  . propranolol (INDERAL) 60 MG tablet Take 30 mg by mouth 2 (two) times daily. 1/2 tablet twice daily      . RESTASIS 0.05 % ophthalmic emulsion Place 1 drop into both eyes every 12 (twelve) hours.       Marland Kitchen warfarin (COUMADIN) 2.5 MG tablet Take 1.25-2.5 mg by mouth daily. Takes 1 tablet on Monday ,wednesday and Friday and 1/2 tablet  all other days       No current facility-administered medications for this encounter.     ALLERGIES: Review of patient's allergies indicates no known allergies.   LABORATORY DATA:  Lab Results  Component Value Date   WBC 7.3 02/15/2013   HGB 13.3 02/15/2013   HCT 38.7 02/15/2013   MCV 86.6 02/15/2013   PLT 192 02/15/2013   Lab Results  Component Value Date   NA 141 02/15/2013   K 3.8 02/15/2013   CL 106 01/20/2013   CO2 24 02/15/2013   Lab Results  Component Value Date   ALT 17 02/15/2013   AST 21 02/15/2013   ALKPHOS 53 02/15/2013   BILITOT 0.42 02/15/2013     NARRATIVE: Debra Lynn was seen today for weekly treatment management. The chart was checked and the patient's films were reviewed. The patient is doing well with treatment. She is on antibiotics currently through medical oncology for a skin infection. This is the site which was previously examined at the beginning of treatment. She has only taken this a couple of days. No difficulties  in the treatment area.  PHYSICAL EXAMINATION: weight is 152 lb 6.4 oz (69.128 kg). Her oral temperature is 97.9 F (36.6 C). Her blood pressure is 159/84 and her pulse is 74. Her respiration is 20.        ASSESSMENT: The patient is doing satisfactorily with treatment.  PLAN: We will continue with the patient's radiation treatment as planned.

## 2013-03-16 NOTE — Progress Notes (Signed)
Weekly rad  txs rtcw, slight erhthema, skin intact, sore on rt chest wall still reddened, was put on doxycycline 100mg  bid 10days per Dr.Magrinat,started wed night, sharp pain in axilla area at times, stated patient, using radiaplex bid Pacemaker was checked in dressing room by female [employer] rep. 5:41 PM

## 2013-03-16 NOTE — Progress Notes (Signed)
  Radiation Oncology         [phone] ________________________________  Name: Debra Lynn MRN: [id number]  Date: 03/01/2013  DOB: 01-30-1929  SIMULATION AND TREATMENT PLANNING NOTE  DIAGNOSIS:  Recurrent right-sided breast cancer  Site:  Right chest wall  NARRATIVE:  The patient was brought to the [facility].  Identity was confirmed.  All relevant records and images related to the planned course of therapy were reviewed.   Written consent to proceed with treatment was confirmed which was freely given after reviewing the details related to the planned course of therapy had been reviewed with the patient.  Then, the patient was set-up in a stable reproducible  supine position for radiation therapy.  CT images were obtained.  Surface markings were placed.    Medically necessary complex treatment device(s) for immobilization:  Customized VAC lock bag.   The CT images were loaded into the planning software.  Then the target and avoidance structures were contoured.  Treatment planning then occurred.  The radiation prescription was entered and confirmed.  A total of 1 complex treatment devices were fabricated which relate to the designed radiation treatment fields - this corresponds to an en face electron field with bolus. Each of these customized fields/ complex treatment devices will be used on a daily basis during the radiation course. I have requested : Special port plan.   PLAN:  The patient will receive 60 Gy in 30 fractions.  ________________________________   Jodelle Gross, MD, PhD

## 2013-03-16 NOTE — Telephone Encounter (Signed)
Called brian small [employer]  for pacemaker check on patient, per Aaron Edelman small "she's already there has been there since 330pm, even though he knew patient wasn't going to be finished with rad tx till after 430pm, he called back statsing Darlina Guys is already doing pateint's pacemaker check as we speak,thanked Aaron Edelman 5:26 PM

## 2013-03-19 ENCOUNTER — Ambulatory Visit
Admission: RE | Admit: 2013-03-19 | Discharge: 2013-03-19 | Disposition: A | Discharge status: Home / Self Care | Payer: Medicare Other | Source: Ambulatory Visit | Attending: Radiation Oncology | Admitting: Radiation Oncology

## 2013-03-20 ENCOUNTER — Ambulatory Visit
Admission: RE | Admit: 2013-03-20 | Discharge: 2013-03-20 | Disposition: A | Discharge status: Home / Self Care | Payer: Medicare Other | Source: Ambulatory Visit | Attending: Radiation Oncology | Admitting: Radiation Oncology

## 2013-03-21 ENCOUNTER — Ambulatory Visit
Admission: RE | Admit: 2013-03-21 | Discharge: 2013-03-21 | Disposition: A | Discharge status: Home / Self Care | Payer: Medicare Other | Source: Ambulatory Visit | Attending: Radiation Oncology | Admitting: Radiation Oncology

## 2013-03-22 ENCOUNTER — Ambulatory Visit: Payer: Medicare Other

## 2013-03-23 ENCOUNTER — Ambulatory Visit
Admission: RE | Admit: 2013-03-23 | Discharge: 2013-03-23 | Disposition: A | Discharge status: Home / Self Care | Payer: Medicare Other | Source: Ambulatory Visit | Attending: Radiation Oncology | Admitting: Radiation Oncology

## 2013-03-23 ENCOUNTER — Encounter: Payer: Self-pay | Admitting: Adult Health

## 2013-03-23 ENCOUNTER — Ambulatory Visit (HOSPITAL_BASED_OUTPATIENT_CLINIC_OR_DEPARTMENT_OTHER): Payer: Medicare Other | Admitting: Adult Health

## 2013-03-23 ENCOUNTER — Ambulatory Visit (INDEPENDENT_AMBULATORY_CARE_PROVIDER_SITE_OTHER): Payer: Medicare Other

## 2013-03-23 VITALS — BP 101/68 | HR 79 | Temp 97.6°F | Ht 63.0 in | Wt 150.3 lb

## 2013-03-23 VITALS — BP 120/82 | HR 74 | Temp 97.8°F | Resp 20 | Ht 63.0 in | Wt 150.0 lb

## 2013-03-23 DIAGNOSIS — C50219 Malignant neoplasm of upper-inner quadrant of unspecified female breast: Secondary | ICD-10-CM

## 2013-03-23 DIAGNOSIS — J309 Allergic rhinitis, unspecified: Secondary | ICD-10-CM

## 2013-03-23 DIAGNOSIS — M949 Disorder of cartilage, unspecified: Secondary | ICD-10-CM

## 2013-03-23 DIAGNOSIS — M899 Disorder of bone, unspecified: Secondary | ICD-10-CM

## 2013-03-23 NOTE — Progress Notes (Signed)
ID: Debra Lynn   DOB: 12/21/1928  MR#: [id number]  DGL#:[id number]  PCP: Milagros Evener, MD GYN: SURolm Bookbinder OTHER JJ:OACZYSA [name]  HISTORY OF PRESENT ILLNESS: The patient had routine screening mammography 12/15/2010 at [facility]. She has heterogeneously dense breast tissue. There was a suggestion of a new mass in the upper inner quadrant of the right breast and she was brought back November 16 for right diagnostic mammography and ultrasonography. Spot compression views confirmed a 1.8 cm multilobulated mass which by ultrasound measured 2.1 cm, was ill-defined and hypoechoic. Biopsies November 19 ([id number]) showed an invasive ductal carcinoma, grade 2, strongly e-cadherin positive, estrogen receptor 90% positive, progesterone receptor negative, with no HER to amplification by CISH. The MIB-1 was 25%.  The patient is not a candidate for MRI because she has a pacemaker in place. Accordingly she underwent BSGI, showing a solitary focus of increased uptake, measuring 2.3 cm. With this information she was seen at the [facility] and a recommendation tomproceed with surgery was made. She had a Right simple mastectomy 01/15/2011. Her subsequent history is as detailed below.   INTERVAL HISTORY: Patient is here as an urgent work in to evaluate her chest wall lesions.  This lesion has been present for approximately one week.  She was prescribed Doxycycline by Dr. Jana Hakim and has one more dose left.  The skin lesion is becoming slightly more erythematous and painful.  She is worried due to the upcoming weekend and wanted [place] to check it.  She denies fevers, chills, night sweats, dizziness, change in sensation, or any other concerns.  She is applying warm compresses BID to the are.    REVIEW OF SYSTEMS: A 10 point review of systems was conducted and is otherwise negative except for what is noted above.     PAST MEDICAL HISTORY: Past Medical History  Diagnosis Date  . Sick sinus syndrome     with  pacemaker and ablation  . Glaucoma   . Seasonal allergic rhinitis     Pos Skin Test 07-05-07  . GERD (gastroesophageal reflux disease)   . Atrial fibrillation   . Hypertension   . Hyperlipidemia   . Cancer bREAST CA(12/2010)  . Chronic kidney disease TUMOR ON RIGHT KIDNEY-OBSERVING  . Arthritis KNEES &BACK  . Neuromuscular disorder SLIGHT TREMOR.  . Bleeding from breast     right  . Pacemaker   . Pericarditis     now "fluid around heart"  . Stroke 2010, 2014    x2, "big one in 2010"  . Seizures 2013    "think had one"  . Parkinsonism   . Headache(784.0)     sinus headaches  . Compression fracture a long time ago    "back fractured when heart stopped"  . Balance problem   . Orthostatic hypotension   . Light headedness   . CVA 05/07/2008    Qualifier: History of  By: Annamaria Boots MD, Clinton D     PAST SURGICAL HISTORY: Past Surgical History  Procedure Laterality Date  . Cholecystectomy    . Vesicovaginal fistula closure w/ tah    . Atrial ablation surgery    . Flexible bronchoscopy w/ upper endoscopy      and swallowing evaluation  . Pacemaker insertion    . Knee arthroscopy Right   . Cataract extraction Bilateral   . Abdominal hysterectomy  1975    pt. unsure if laparascopic/vaginal/abdominal  . Breast surgery  01/2011    mastectomy  . Insert / replace / remove pacemaker  01/2007    FOLLOWED BY [facility]; [employer], 3rd pacemaker  . Mass excision Right 01/19/2013    Procedure: EXCISION CHEST WALL MASS;  Surgeon: [name], MD;  Location: [facility];  Service: General;  Laterality: Right;    FAMILY HISTORY Family History  Problem Relation Age of Onset  . Asthma Brother   . Asthma Daughter   . Heart disease    . Cancer    . Heart failure Mother   . Cancer Father     brain  . Lymphoma Brother   . Diabetes Brother   . Hypertension Brother   The patient's father died at the age of 64 from central nervous system cancer. The patient's mother died at the age of 53.  The patient had one brother who had a history of lymphoma, but survives.  GYNECOLOGIC HISTORY: GX P2. 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 Her husband [name] used to work for [employer]. Daughter [name] lives in [place] and is a retired Merchant navy officer. [name] is a widow of my former patient "[name]" [name] Daughter Abbegail Matuska lives in [place] where she works as a Orthoptist. The patient has 2 grandsons. She is not a Ambulance person.    ADVANCED DIRECTIVES: in place  HEALTH MAINTENANCE: History  Substance Use Topics  . Smoking status: Never Smoker   . Smokeless tobacco: Never Used  . Alcohol Use: No     Colonoscopy:  PAP:  Bone density: SOLIS Jan 2013, T - 1.7 R fem neck  Lipid panel:  No Known Allergies  Current Outpatient Prescriptions  Medication Sig Dispense Refill  . acetaminophen (TYLENOL) 500 MG tablet Take 1,000 mg by mouth every 6 (six) hours as needed. For pain.      Marland Kitchen anastrozole (ARIMIDEX) 1 MG tablet Take 1 tablet (1 mg total) by mouth daily.  90 tablet  12  . benzonatate (TESSALON) 100 MG capsule Take 100 mg by mouth 3 (three) times daily as needed for cough.      . Calcium Carbonate-Vitamin D (CALCIUM-VITAMIN D) 500-200 MG-UNIT per tablet Take 1 tablet by mouth daily.       . cephALEXin (KEFLEX) 250 MG capsule Take 1 capsule by mouth at bedtime.       . colestipol (COLESTID) 1 G tablet Take 1 g by mouth at bedtime.      Marland Kitchen doxycycline (VIBRA-TABS) 100 MG tablet Take 1 tablet (100 mg total) by mouth 2 (two) times daily.  20 tablet  1  . furosemide (LASIX) 40 MG tablet Take 40 mg by mouth every morning.      . levETIRAcetam (KEPPRA) 500 MG tablet Take 250 mg by mouth every 12 (twelve) hours.      . Multiple Vitamin (MULTIVITAMIN WITH MINERALS) TABS tablet Take 1 tablet by mouth  daily.      . pantoprazole (PROTONIX) 40 MG tablet Take 1 tablet by mouth daily.      . potassium chloride SA (K-DUR,KLOR-CON) 20 MEQ tablet Take 1 tablet by mouth at bedtime.       . pravastatin (PRAVACHOL) 80 MG tablet Take 80 mg by mouth at bedtime.      . Probiotic Product (ALIGN) 4 MG CAPS Take 4 mg by mouth daily.  30 capsule  0  . propranolol (INDERAL) 60 MG tablet Take 30  mg by mouth 2 (two) times daily. 1/2 tablet twice daily      . RESTASIS 0.05 % ophthalmic emulsion Place 1 drop into both eyes every 12 (twelve) hours.       Marland Kitchen warfarin (COUMADIN) 2.5 MG tablet Take 1.25-2.5 mg by mouth daily. Takes 1 tablet on Monday ,wednesday and Friday and 1/2 tablet all other days       No current facility-administered medications for this visit.    OBJECTIVE: Elderly white female who appears stated age 40 Vitals:   03/23/13 1612  BP: 120/82  Pulse: 74  Temp: 97.8 F (36.6 C)  Resp: 20     Body mass index is 26.58 kg/(m^2).    ECOG FS: 2  Sclerae unicteric, bilateral arcus senilis Oropharynx no thrush or other lesions; teeth in good repair No cervical or supraclavicular adenopathy Lungs no rales or rhonchi, fair excursion bilaterally Heart regular rate and rhythm (paced) Abd soft, nontender, positive bowel sounds MSK no edema; no focal spinal tenderness Neuro: Nonfocal; mild facial rigidity; moderate shuffling gait, She does not appear depressed, speaks slowly but in full sentences, is well oriented. Affect is pleasant Breasts: deferred Skin: on the right chest wall, near the mid clavicular line is a circular erythematous lesion approximately 1cm, with a pustular center.    LAB RESULTS: Lab Results  Component Value Date   WBC 7.3 02/15/2013   NEUTROABS 4.4 02/15/2013   HGB 13.3 02/15/2013   HCT 38.7 02/15/2013   MCV 86.6 02/15/2013   PLT 192 02/15/2013      Chemistry      Component Value Date/Time   NA 141 02/15/2013 1352   NA 140 01/20/2013 0615   K 3.8 02/15/2013 1352   K  3.6 01/20/2013 0615   CL 106 01/20/2013 0615   CL 104 03/23/2012 1329   CO2 24 02/15/2013 1352   CO2 24 01/20/2013 0615   BUN 20.0 02/15/2013 1352   BUN 11 01/20/2013 0615   CREATININE 1.0 02/15/2013 1352   CREATININE 0.74 01/20/2013 0615      Component Value Date/Time   CALCIUM 10.0 02/15/2013 1352   CALCIUM 8.8 01/20/2013 0615   ALKPHOS 53 02/15/2013 1352   ALKPHOS 41 08/23/2012 1928   AST 21 02/15/2013 1352   AST 23 08/23/2012 1928   ALT 17 02/15/2013 1352   ALT 13 08/23/2012 1928   BILITOT 0.42 02/15/2013 1352   BILITOT 0.3 08/23/2012 1928       Lab Results  Component Value Date   LABCA2 15 12/30/2010    No components found with this basename: [id number]    No results found for this basename: INR,  in the last 168 hours  Urinalysis    Component Value Date/Time   COLORURINE YELLOW 08/23/2012 2052   APPEARANCEUR CLOUDY* 08/23/2012 2052   LABSPEC 1.010 08/23/2012 2052   PHURINE 7.0 08/23/2012 2052   GLUCOSEU NEGATIVE 08/23/2012 2052   HGBUR NEGATIVE 08/23/2012 2052   BILIRUBINUR NEGATIVE 08/23/2012 2052   KETONESUR NEGATIVE 08/23/2012 2052   PROTEINUR 30* 08/23/2012 2052   UROBILINOGEN 0.2 08/23/2012 2052   NITRITE NEGATIVE 08/23/2012 2052   LEUKOCYTESUR MODERATE* 08/23/2012 2052    STUDIES: Ct Chest W Contrast  02/21/2013   CLINICAL DATA:  Restaging of recurrent breast carcinoma. History of a mastectomy.  EXAM: CT CHEST WITH CONTRAST  TECHNIQUE: Multidetector CT imaging of the chest was performed during intravenous contrast administration.  CONTRAST:  13m OMNIPAQUE IOHEXOL 300 MG/ML  SOLN  COMPARISON:  Chest radiograph,  01/15/2013.  Chest CT, 06/18/2010.  FINDINGS: Status post right mastectomy. No neck base or axillary masses or adenopathy.  Heart is normal in size. There are coronary artery calcifications. Dilation of the ascending aorta is stable. No aortic dissection an apparent right subclavian is also stable.  No mediastinal or hilar masses or pathologically enlarged lymph nodes.   There are small lung nodules. A 5 mm nodule lies along the posterior pleural margin of the superior segment of the right lower lobe. A 7 mm pleural-based nodule lies along the lateral base of the left lower lobe with an adjacent 5 mm nodule. There are several smaller semi-solid nodules noted bilaterally. The nodule in the right lower lobe superior segment was less well-defined previously. The 5 mm nodule in the left lateral lung base and the adjacent 7 mm nodule were both present previously, but have increased in size. Several of the smaller semi-solid nodules were not present on the prior CT.  There is minor lung base subsegmental atelectasis and there are minor areas of stable parenchymal scarring. More notable pleural parenchymal scarring is noted at the apices, unchanged. No pleural effusion.  There is an 11 mm low-density lesion in the lateral segment of the left lobe of the liver. More vague hypo attenuating area was present in this location on the prior study. This may reflect a cyst or hemangioma. Metastatic disease is felt unlikely.  No osteoblastic or osteolytic lesions.  IMPRESSION: 1. Small lung nodules. The more discrete nodules were present previously, but have mildly increased in size since the CT dated 06/18/2010. Smaller semi-solid nodules noted on the current exam were not convincingly present previously. 2. No other evidence suggesting metastatic disease within the chest.   Electronically Signed   By: Lajean Manes M.D.   On: 02/21/2013 14:58   Nm Bone Scan Whole Body  02/21/2013   CLINICAL DATA:  Recurrent right breast cancer.  EXAM: NUCLEAR MEDICINE WHOLE BODY BONE SCAN  TECHNIQUE: Whole body anterior and posterior images were obtained approximately 3 hours after intravenous injection of radiopharmaceutical.  COMPARISON:  Chest CT 02/21/2013  RADIOPHARMACEUTICALS:  25 mCi Technetium-99 MDP  FINDINGS: There is expected uptake in the renal collecting systems and urinary bladder. No suspicious  uptake in the axial or appendicular skeleton. Degenerative changes in the right knee. No suspicious uptake in the ribs.  IMPRESSION: No evidence for metastatic bone disease.   Electronically Signed   By: Markus Daft M.D.   On: 02/21/2013 16:00    ASSESSMENT: 80 y.o. [place] woman status post simple mastectomy 01/15/2011 for a T2 NX (stage II) invasive ductal carcinoma, grade 3, 100% estrogen receptor positive, with a borderline MIB-1, progesterone receptor and HER-2 negative.  (1) not a candidate for tamoxifen due to cardiac issues and decided against aromatase inhibitors because of osteopenia present at baseline and concerns re. bisphosphonates  (2) local recurrence to the right chest wall documented by biopsy 01-2013 showing an invasive ductal carcinoma, grade 1 or 2, estrogen receptor strongly positive, progesterone receptor and HER-2 negative, with an MIB-1 of 28%.  (3) excision of the right chest wall mass 01/19/2013 with negative but close margins (the deep margin is skeletal muscle).  (4) Ct chest January 2015 shows small bilateral lung lesions many of which are longstanding; repeat chest CT DEC 2015 planned  (5) osteopenia per bone density 1/13 with T -1.5 as lowest score   PLAN: Patient is doing well.  I saw her briefly to evaluate her chest wall lesion.  I spoke with Dr. Jana Hakim about this lesion and it appears worse to him.  The patient will finish her prescription of Doxycycline.  She will continue warm compresses BID.  Should the area rapidly progress or should she develop fevers I instructed her to proceed to the emergency room.  I talked to Dr. Donne Hazel about this, and he will evaluate her next week.    Minette Headland, [place] [phone]  [place], [place] Delane Ginger    03/23/2013

## 2013-03-23 NOTE — Progress Notes (Signed)
Department of Radiation Oncology  Phone:  [phone] Fax:        [phone]  Weekly Treatment Note    Name: Debra Lynn Date: 03/23/2013 MRN: [id number] DOB: 1928/06/29   Current dose: 22 Gy  Current fraction: 11   MEDICATIONS: Current Outpatient Prescriptions  Medication Sig Dispense Refill  . acetaminophen (TYLENOL) 500 MG tablet Take 1,000 mg by mouth every 6 (six) hours as needed. For pain.      . benzonatate (TESSALON) 100 MG capsule Take 100 mg by mouth 3 (three) times daily as needed for cough.      . Calcium Carbonate-Vitamin D (CALCIUM-VITAMIN D) 500-200 MG-UNIT per tablet Take 1 tablet by mouth daily.       . cephALEXin (KEFLEX) 250 MG capsule Take 1 capsule by mouth at bedtime.       . colestipol (COLESTID) 1 G tablet Take 1 g by mouth at bedtime.      Marland Kitchen doxycycline (VIBRA-TABS) 100 MG tablet Take 1 tablet (100 mg total) by mouth 2 (two) times daily.  20 tablet  1  . furosemide (LASIX) 40 MG tablet Take 40 mg by mouth every morning.      . levETIRAcetam (KEPPRA) 500 MG tablet Take 250 mg by mouth every 12 (twelve) hours.      . pantoprazole (PROTONIX) 40 MG tablet Take 1 tablet by mouth daily.      . potassium chloride SA (K-DUR,KLOR-CON) 20 MEQ tablet Take 1 tablet by mouth at bedtime.       . pravastatin (PRAVACHOL) 80 MG tablet Take 80 mg by mouth at bedtime.      . Probiotic Product (ALIGN) 4 MG CAPS Take 4 mg by mouth daily.  30 capsule  0  . propranolol (INDERAL) 60 MG tablet Take 30 mg by mouth 2 (two) times daily. 1/2 tablet twice daily      . RESTASIS 0.05 % ophthalmic emulsion Place 1 drop into both eyes every 12 (twelve) hours.       Marland Kitchen warfarin (COUMADIN) 2.5 MG tablet Take 1.25-2.5 mg by mouth daily. Takes 1 tablet on Monday ,wednesday and Friday and 1/2 tablet all other days      . anastrozole (ARIMIDEX) 1 MG tablet Take 1 tablet (1 mg total) by mouth daily.  90 tablet  12  . Multiple Vitamin (MULTIVITAMIN WITH MINERALS) TABS tablet Take 1 tablet  by mouth daily.       No current facility-administered medications for this encounter.     ALLERGIES: Review of patient's allergies indicates no known allergies.   LABORATORY DATA:  Lab Results  Component Value Date   WBC 7.3 02/15/2013   HGB 13.3 02/15/2013   HCT 38.7 02/15/2013   MCV 86.6 02/15/2013   PLT 192 02/15/2013   Lab Results  Component Value Date   NA 141 02/15/2013   K 3.8 02/15/2013   CL 106 01/20/2013   CO2 24 02/15/2013   Lab Results  Component Value Date   ALT 17 02/15/2013   AST 21 02/15/2013   ALKPHOS 53 02/15/2013   BILITOT 0.42 02/15/2013     NARRATIVE: Debra Lynn was seen today for weekly treatment management. The chart was checked and the patient's films were reviewed. The patient is doing well with treatment to the scar region. No significant irritation in this area. She does complain of some ongoing pain corresponding to the lesion currently being treated with doxycycline. They have one more day but feel that this area  is a little bigger.  PHYSICAL EXAMINATION: height is 5\' 3"  (1.6 m) and weight is 150 lb 4.8 oz (68.176 kg). Her temperature is 97.6 F (36.4 C). Her blood pressure is 101/68 and her pulse is 79.      the treatment area shows some mild hyperpigmentation. The lesion noted above, now of the treatment area anteriorly within the chest is a little larger with some peeling and pallor centrally  ASSESSMENT: The patient is doing satisfactorily with treatment.  PLAN: We will continue with the patient's radiation treatment as planned. The patient is going to stop by medical oncology today to have him look at the area in question. We'll discuss with him further management to their office versus seeing the surgeon which they inquire about.

## 2013-03-23 NOTE — Patient Instructions (Signed)
Apply very warm compresses to area twice a day.  Dr. Melissa Montane office will call you with an appointment date and time.  Please call us if you have any questions or concerns.    If you develop fevers, or the lesion rapidly progresses please go to urgent care or the emergency room.

## 2013-03-23 NOTE — Progress Notes (Signed)
Debra Lynn has had 11 fractions to her right chest wall.  She is having pain due to a large pimple on her right upper chest.  She says it has gotten bigger and is painful when anything touches it.  She has 1 tablet of doxycycline left.  The skin on her right chest is intact.  She is using radiaplex gel.  She reports fatigue.

## 2013-03-26 ENCOUNTER — Telehealth (INDEPENDENT_AMBULATORY_CARE_PROVIDER_SITE_OTHER): Payer: Self-pay

## 2013-03-26 ENCOUNTER — Ambulatory Visit: Payer: Medicare Other

## 2013-03-26 ENCOUNTER — Ambulatory Visit
Admission: RE | Admit: 2013-03-26 | Discharge: 2013-03-26 | Disposition: A | Discharge status: Home / Self Care | Payer: Medicare Other | Source: Ambulatory Visit | Attending: Radiation Oncology | Admitting: Radiation Oncology

## 2013-03-26 NOTE — Telephone Encounter (Signed)
LMOM stating that I did not have an earlier appt than the appt she was given for the pm. I advised pt that she would be the first appt for the pm since she had another appt already at the [facility].

## 2013-03-26 NOTE — Telephone Encounter (Signed)
Called pt to make her a f/u appt with Dr Donne Hazel for this week. I scheduled for an appt on 2/24 arrive at 1:15/1:30. The pt understands.

## 2013-03-26 NOTE — Telephone Encounter (Signed)
Needs early appt if she can, chemo @230  [name] # [phone]

## 2013-03-27 ENCOUNTER — Ambulatory Visit: Payer: Medicare Other

## 2013-03-27 ENCOUNTER — Ambulatory Visit (INDEPENDENT_AMBULATORY_CARE_PROVIDER_SITE_OTHER): Payer: Medicare Other | Admitting: General Surgery

## 2013-03-27 ENCOUNTER — Encounter (INDEPENDENT_AMBULATORY_CARE_PROVIDER_SITE_OTHER): Payer: Self-pay | Admitting: General Surgery

## 2013-03-27 ENCOUNTER — Ambulatory Visit
Admission: RE | Admit: 2013-03-27 | Discharge: 2013-03-27 | Disposition: A | Discharge status: Home / Self Care | Payer: Medicare Other | Source: Ambulatory Visit | Attending: Radiation Oncology | Admitting: Radiation Oncology

## 2013-03-27 VITALS — BP 122/70 | HR 68 | Resp 14 | Ht 63.0 in | Wt 145.6 lb

## 2013-03-27 DIAGNOSIS — R222 Localized swelling, mass and lump, trunk: Secondary | ICD-10-CM

## 2013-03-27 NOTE — Progress Notes (Signed)
Subjective:     Patient ID: Debra Lynn, female   DOB: [birth year], 80 y.o.   MRN: [id number]  HPI 3 yof who I now from recent excision of a right chest wall recurrence who is now undergoing radiotherapy.  She has a right chest wall mass that is painful and has been present for several weeks. This has been treated with abx, local therapies with no improvement.  She comes in today to have this looked at.  She took her coumadin yesterday.  Review of Systems     Objective:   Physical Exam  Constitutional: She appears well-developed and well-nourished.  Pulmonary/Chest: Right breast exhibits mass.         Assessment:     Right chest wall mass    Plan:    Local excision in office on Friday after stops coumadin today  This area will not heal conservatively  Needs to be excised due to symptoms as for diagnosis.  We discussed performance of procedure and possible complications. Will plan to do later this week.

## 2013-03-28 ENCOUNTER — Ambulatory Visit: Payer: Medicare Other

## 2013-03-28 ENCOUNTER — Telehealth: Payer: Self-pay | Admitting: Pharmacist

## 2013-03-28 ENCOUNTER — Ambulatory Visit
Admission: RE | Admit: 2013-03-28 | Discharge: 2013-03-28 | Disposition: A | Discharge status: Home / Self Care | Payer: Medicare Other | Source: Ambulatory Visit | Attending: Radiation Oncology | Admitting: Radiation Oncology

## 2013-03-28 NOTE — Telephone Encounter (Signed)
Patient is having a cyst removed from her breast on 03/30/13 by Dr. Donne Hazel.  Dr. Donne Hazel already instructed patient to hold warfarin 3 days prior.  Patient to restart warfarin 03/30/13 2.5 mg x 2 days, then resume 1.25 mg qd except 2.5 mg MWF and recheck INR ten days later.  Appointment made.  To Dr. Irish Lack as Juluis Rainier.

## 2013-03-29 ENCOUNTER — Encounter (INDEPENDENT_AMBULATORY_CARE_PROVIDER_SITE_OTHER): Payer: Self-pay | Admitting: General Surgery

## 2013-03-29 ENCOUNTER — Ambulatory Visit: Payer: Medicare Other

## 2013-03-29 ENCOUNTER — Ambulatory Visit
Admission: RE | Admit: 2013-03-29 | Discharge: 2013-03-29 | Disposition: A | Discharge status: Home / Self Care | Payer: Medicare Other | Source: Ambulatory Visit | Attending: Radiation Oncology | Admitting: Radiation Oncology

## 2013-03-29 ENCOUNTER — Other Ambulatory Visit: Payer: Medicare Other

## 2013-03-29 ENCOUNTER — Ambulatory Visit: Payer: Medicare Other | Admitting: Oncology

## 2013-03-30 ENCOUNTER — Ambulatory Visit: Payer: Medicare Other

## 2013-03-30 ENCOUNTER — Encounter (INDEPENDENT_AMBULATORY_CARE_PROVIDER_SITE_OTHER): Payer: Self-pay | Admitting: General Surgery

## 2013-03-30 ENCOUNTER — Other Ambulatory Visit (INDEPENDENT_AMBULATORY_CARE_PROVIDER_SITE_OTHER): Payer: Self-pay | Admitting: General Surgery

## 2013-03-30 ENCOUNTER — Ambulatory Visit: Payer: Medicare Other | Admitting: Radiation Oncology

## 2013-03-30 ENCOUNTER — Ambulatory Visit (INDEPENDENT_AMBULATORY_CARE_PROVIDER_SITE_OTHER): Payer: Medicare Other | Admitting: General Surgery

## 2013-03-30 DIAGNOSIS — L989 Disorder of the skin and subcutaneous tissue, unspecified: Secondary | ICD-10-CM

## 2013-03-30 DIAGNOSIS — R222 Localized swelling, mass and lump, trunk: Secondary | ICD-10-CM

## 2013-03-30 NOTE — Progress Notes (Signed)
Subjective:     Patient ID: Debra Lynn, female   DOB: 02/14/1928, 80 y.o.   MRN: [id number]  HPI This is an 80 year old female [place] from her right breast cancer as well as a chest wall recurrence. She is undergoing radiation. She's had a mass on her right chest wall and I saw in a week. She presents today for excision.  Review of Systems     Objective:   Physical Exam  unchanged    Assessment:     Right chest wall mass     Plan:     We discussed excision area and I closed this area with ChloraPrep. I then anesthetized with lidocaine. I then made an elliptical incision encompassing the mass and removed it in total. I closed this with 3-0 Vicryl and 3-0 nylon sutures. She tolerated this well. Dressing was placed. I will call her with her pathology she will return to see me in one week for suture removal.

## 2013-04-02 ENCOUNTER — Ambulatory Visit
Admission: RE | Admit: 2013-04-02 | Discharge: 2013-04-02 | Disposition: A | Discharge status: Home / Self Care | Payer: Medicare Other | Source: Ambulatory Visit | Attending: Radiation Oncology | Admitting: Radiation Oncology

## 2013-04-03 ENCOUNTER — Ambulatory Visit: Payer: Medicare Other

## 2013-04-04 ENCOUNTER — Telehealth: Payer: Self-pay | Admitting: *Deleted

## 2013-04-04 ENCOUNTER — Ambulatory Visit
Admission: RE | Admit: 2013-04-04 | Discharge: 2013-04-04 | Disposition: A | Discharge status: Home / Self Care | Payer: Medicare Other | Source: Ambulatory Visit | Attending: Radiation Oncology | Admitting: Radiation Oncology

## 2013-04-04 NOTE — Telephone Encounter (Signed)
patient family member came  To nursing asking [place] to call Dr.Magrinat's office or nurse and inform them that patien'ts path result came back o squamous cell carcinoma well differentiated, left voice mail on [phone], of such information, will e-mail Dr.moody who is off today and in [place] tomorrow till 3pm, and Dr. Jana Hakim 2:41 PM'

## 2013-04-05 ENCOUNTER — Ambulatory Visit
Admission: RE | Admit: 2013-04-05 | Discharge: 2013-04-05 | Disposition: A | Discharge status: Home / Self Care | Payer: Medicare Other | Source: Ambulatory Visit | Attending: Radiation Oncology | Admitting: Radiation Oncology

## 2013-04-05 ENCOUNTER — Ambulatory Visit (INDEPENDENT_AMBULATORY_CARE_PROVIDER_SITE_OTHER): Payer: Medicare Other

## 2013-04-05 DIAGNOSIS — J309 Allergic rhinitis, unspecified: Secondary | ICD-10-CM

## 2013-04-06 ENCOUNTER — Ambulatory Visit
Admission: RE | Admit: 2013-04-06 | Discharge: 2013-04-06 | Disposition: A | Discharge status: Home / Self Care | Payer: Medicare Other | Source: Ambulatory Visit | Attending: Radiation Oncology | Admitting: Radiation Oncology

## 2013-04-06 ENCOUNTER — Encounter (INDEPENDENT_AMBULATORY_CARE_PROVIDER_SITE_OTHER): Payer: Self-pay | Admitting: General Surgery

## 2013-04-06 ENCOUNTER — Ambulatory Visit (INDEPENDENT_AMBULATORY_CARE_PROVIDER_SITE_OTHER): Payer: Medicare Other | Admitting: General Surgery

## 2013-04-06 VITALS — BP 133/84 | HR 70 | Temp 97.4°F | Ht 63.0 in | Wt 150.3 lb

## 2013-04-06 VITALS — BP 126/80 | HR 77 | Temp 98.5°F | Resp 12 | Ht 63.0 in | Wt 148.0 lb

## 2013-04-06 DIAGNOSIS — Z09 Encounter for follow-up examination after completed treatment for conditions other than malignant neoplasm: Secondary | ICD-10-CM

## 2013-04-06 DIAGNOSIS — C50219 Malignant neoplasm of upper-inner quadrant of unspecified female breast: Secondary | ICD-10-CM

## 2013-04-06 NOTE — Progress Notes (Signed)
Debra Lynn has had 19 fractions to her right chest wall.  She denies pain.  She reports fatigue and has had a cold.  The skin on her right chest wall is intact and pink.  She is using radiaplex twice a day.  She also has intact steri-strips on her upper right chest from a skin cancer removal.

## 2013-04-06 NOTE — Progress Notes (Signed)
Subjective:     Patient ID: Debra Lynn, female   DOB: [birth year], 80 y.o.   MRN: [id number]  HPI 80 year old female I know well from the treatment of her breast cancer. She is undergoing radiation therapy. She had a lesion, but her right chest I excised in the office. This is a well-differentiated squamous cell carcinoma with margins are free. She returns today doing well without complaint.  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 She is going to continue radiation. I removed her sutures and applied Steri-Strips. These will come off on their own. She'll followup as needed.

## 2013-04-06 NOTE — Progress Notes (Signed)
Department of Radiation Oncology  Phone:  [phone] Fax:        [phone]  Weekly Treatment Note    Name: Debra Lynn Date: 04/06/2013 MRN: [id number] DOB: Dec 05, 1928   Current dose: 38 Gy  Current fraction: 19   MEDICATIONS: Current Outpatient Prescriptions  Medication Sig Dispense Refill  . acetaminophen (TYLENOL) 500 MG tablet Take 1,000 mg by mouth every 6 (six) hours as needed. For pain.      . benzonatate (TESSALON) 100 MG capsule Take 100 mg by mouth 3 (three) times daily as needed for cough.      . Calcium Carbonate-Vitamin D (CALCIUM-VITAMIN D) 500-200 MG-UNIT per tablet Take 1 tablet by mouth daily.       . cephALEXin (KEFLEX) 250 MG capsule Take 1 capsule by mouth at bedtime.       . colestipol (COLESTID) 1 G tablet Take 1 g by mouth at bedtime.      . furosemide (LASIX) 40 MG tablet Take 40 mg by mouth every morning.      . levETIRAcetam (KEPPRA) 500 MG tablet Take 250 mg by mouth every 12 (twelve) hours.      . Multiple Vitamin (MULTIVITAMIN WITH MINERALS) TABS tablet Take 1 tablet by mouth daily.      . pantoprazole (PROTONIX) 40 MG tablet Take 1 tablet by mouth daily.      . potassium chloride SA (K-DUR,KLOR-CON) 20 MEQ tablet Take 1 tablet by mouth at bedtime.       . pravastatin (PRAVACHOL) 80 MG tablet Take 80 mg by mouth at bedtime.      . Probiotic Product (ALIGN) 4 MG CAPS Take 4 mg by mouth daily.  30 capsule  0  . propranolol (INDERAL) 60 MG tablet Take 30 mg by mouth 2 (two) times daily. 1/2 tablet twice daily      . RESTASIS 0.05 % ophthalmic emulsion Place 1 drop into both eyes every 12 (twelve) hours.       Marland Kitchen warfarin (COUMADIN) 2.5 MG tablet Take 1.25-2.5 mg by mouth daily. Takes 1 tablet on Monday ,wednesday and Friday and 1/2 tablet all other days      . anastrozole (ARIMIDEX) 1 MG tablet Take 1 tablet (1 mg total) by mouth daily.  90 tablet  12   No current facility-administered medications for this encounter.     ALLERGIES: Review of  patient's allergies indicates no known allergies.   LABORATORY DATA:  Lab Results  Component Value Date   WBC 7.3 02/15/2013   HGB 13.3 02/15/2013   HCT 38.7 02/15/2013   MCV 86.6 02/15/2013   PLT 192 02/15/2013   Lab Results  Component Value Date   NA 141 02/15/2013   K 3.8 02/15/2013   CL 106 01/20/2013   CO2 24 02/15/2013   Lab Results  Component Value Date   ALT 17 02/15/2013   AST 21 02/15/2013   ALKPHOS 53 02/15/2013   BILITOT 0.42 02/15/2013     NARRATIVE: Debra Lynn was seen today for weekly treatment management. The chart was checked and the patient's films were reviewed. The patient states she is doing well with her radiation treatment. No major change in her scan. The lesion in the anterior upper chest return positive for a squamous cell carcinoma. This skin cancer was removed satisfactorily. She is healing well from this. This area is outside of the radiation treatment area.  PHYSICAL EXAMINATION: height is 5\' 3"  (1.6 m) and weight is 150 lb 4.8  oz (68.176 kg). Her temperature is 97.4 F (36.3 C). Her blood pressure is 133/84 and her pulse is 70.      mild to moderate skin irritation with hyperpigmentation  ASSESSMENT: The patient is doing satisfactorily with treatment.  PLAN: We will continue with the patient's radiation treatment as planned.

## 2013-04-08 ENCOUNTER — Ambulatory Visit: Payer: Medicare Other

## 2013-04-09 ENCOUNTER — Encounter: Payer: Self-pay | Admitting: *Deleted

## 2013-04-09 ENCOUNTER — Ambulatory Visit
Admission: RE | Admit: 2013-04-09 | Discharge: 2013-04-09 | Disposition: A | Discharge status: Home / Self Care | Payer: Medicare Other | Source: Ambulatory Visit | Attending: Radiation Oncology | Admitting: Radiation Oncology

## 2013-04-09 ENCOUNTER — Ambulatory Visit: Payer: Medicare Other

## 2013-04-10 ENCOUNTER — Ambulatory Visit (INDEPENDENT_AMBULATORY_CARE_PROVIDER_SITE_OTHER): Payer: Medicare Other | Admitting: Pharmacist

## 2013-04-10 ENCOUNTER — Ambulatory Visit
Admission: RE | Admit: 2013-04-10 | Discharge: 2013-04-10 | Disposition: A | Discharge status: Home / Self Care | Payer: Medicare Other | Source: Ambulatory Visit | Attending: Radiation Oncology | Admitting: Radiation Oncology

## 2013-04-10 ENCOUNTER — Ambulatory Visit: Payer: Medicare Other

## 2013-04-10 DIAGNOSIS — I6789 Other cerebrovascular disease: Secondary | ICD-10-CM

## 2013-04-10 DIAGNOSIS — I4891 Unspecified atrial fibrillation: Secondary | ICD-10-CM

## 2013-04-10 LAB — POCT INR: INR: 1.7

## 2013-04-11 ENCOUNTER — Ambulatory Visit
Admission: RE | Admit: 2013-04-11 | Discharge: 2013-04-11 | Disposition: A | Discharge status: Home / Self Care | Payer: Medicare Other | Source: Ambulatory Visit | Attending: Radiation Oncology | Admitting: Radiation Oncology

## 2013-04-11 ENCOUNTER — Ambulatory Visit: Payer: Medicare Other

## 2013-04-12 ENCOUNTER — Telehealth: Payer: Self-pay

## 2013-04-12 ENCOUNTER — Ambulatory Visit: Payer: Medicare Other

## 2013-04-12 ENCOUNTER — Ambulatory Visit (INDEPENDENT_AMBULATORY_CARE_PROVIDER_SITE_OTHER): Payer: Medicare Other

## 2013-04-12 ENCOUNTER — Ambulatory Visit
Admission: RE | Admit: 2013-04-12 | Discharge: 2013-04-12 | Disposition: A | Discharge status: Home / Self Care | Payer: Medicare Other | Source: Ambulatory Visit | Attending: Radiation Oncology | Admitting: Radiation Oncology

## 2013-04-12 DIAGNOSIS — J309 Allergic rhinitis, unspecified: Secondary | ICD-10-CM

## 2013-04-12 MED ORDER — FUROSEMIDE 40 MG PO TABS: 40.0000 mg | ORAL_TABLET | Freq: Every morning | ORAL | Status: DC

## 2013-04-12 NOTE — Telephone Encounter (Signed)
Refilled

## 2013-04-13 ENCOUNTER — Ambulatory Visit
Admission: RE | Admit: 2013-04-13 | Discharge: 2013-04-13 | Disposition: A | Discharge status: Home / Self Care | Payer: Medicare Other | Source: Ambulatory Visit | Attending: Radiation Oncology | Admitting: Radiation Oncology

## 2013-04-13 ENCOUNTER — Other Ambulatory Visit: Payer: Self-pay | Admitting: Oncology

## 2013-04-13 ENCOUNTER — Ambulatory Visit: Payer: Medicare Other

## 2013-04-13 VITALS — BP 125/85 | HR 74 | Temp 97.8°F | Ht 63.0 in | Wt 148.8 lb

## 2013-04-13 DIAGNOSIS — C50219 Malignant neoplasm of upper-inner quadrant of unspecified female breast: Secondary | ICD-10-CM

## 2013-04-13 NOTE — Progress Notes (Signed)
Debra Lynn has had 24 fractions to her rig fh chest wall.  She denies pain except for some soreness underneath her right arm.  She reports fatigue.  The skin on her right chest wall is pink.  She is using radiaplex twice a day.  She has steri-strips on her right upper chest.

## 2013-04-13 NOTE — Progress Notes (Signed)
Department of Radiation Oncology  Phone:  [phone] Fax:        [phone]  Weekly Treatment Note    Name: Debra Lynn Date: 04/13/2013 MRN: [id number] DOB: Jun 15, 1928   Current dose: 48 Gy  Current fraction: 24   MEDICATIONS: Current Outpatient Prescriptions  Medication Sig Dispense Refill  . acetaminophen (TYLENOL) 500 MG tablet Take 1,000 mg by mouth every 6 (six) hours as needed. For pain.      . benzonatate (TESSALON) 100 MG capsule Take 100 mg by mouth 3 (three) times daily as needed for cough.      . Calcium Carbonate-Vitamin D (CALCIUM-VITAMIN D) 500-200 MG-UNIT per tablet Take 1 tablet by mouth daily.       . cephALEXin (KEFLEX) 250 MG capsule Take 1 capsule by mouth at bedtime.       . colestipol (COLESTID) 1 G tablet Take 1 g by mouth at bedtime.      . furosemide (LASIX) 40 MG tablet Take 1 tablet (40 mg total) by mouth every morning.  90 tablet  1  . levETIRAcetam (KEPPRA) 500 MG tablet Take 250 mg by mouth every 12 (twelve) hours.      . Multiple Vitamin (MULTIVITAMIN WITH MINERALS) TABS tablet Take 1 tablet by mouth daily.      . pantoprazole (PROTONIX) 40 MG tablet Take 1 tablet by mouth daily.      . potassium chloride SA (K-DUR,KLOR-CON) 20 MEQ tablet Take 1 tablet by mouth at bedtime.       . pravastatin (PRAVACHOL) 80 MG tablet Take 80 mg by mouth at bedtime.      . Probiotic Product (ALIGN) 4 MG CAPS Take 4 mg by mouth daily.  30 capsule  0  . propranolol (INDERAL) 60 MG tablet Take 30 mg by mouth 2 (two) times daily. 1/2 tablet twice daily      . RESTASIS 0.05 % ophthalmic emulsion Place 1 drop into both eyes every 12 (twelve) hours.       Marland Kitchen warfarin (COUMADIN) 2.5 MG tablet Take 1.25-2.5 mg by mouth daily. Takes 1 tablet on Monday ,wednesday and Friday and 1/2 tablet all other days      . anastrozole (ARIMIDEX) 1 MG tablet Take 1 tablet (1 mg total) by mouth daily.  90 tablet  12   No current facility-administered medications for this encounter.       ALLERGIES: Review of patient's allergies indicates no known allergies.   LABORATORY DATA:  Lab Results  Component Value Date   WBC 7.3 02/15/2013   HGB 13.3 02/15/2013   HCT 38.7 02/15/2013   MCV 86.6 02/15/2013   PLT 192 02/15/2013   Lab Results  Component Value Date   NA 141 02/15/2013   K 3.8 02/15/2013   CL 106 01/20/2013   CO2 24 02/15/2013   Lab Results  Component Value Date   ALT 17 02/15/2013   AST 21 02/15/2013   ALKPHOS 53 02/15/2013   BILITOT 0.42 02/15/2013     NARRATIVE: Debra Lynn was seen today for weekly treatment management. The chart was checked and the patient's films were reviewed. The patient states that she is doing well. As the treatment area shows some continued changes but no major change since last week. Some redness around the treatment area according to the patient but this is not causing significant pain.  PHYSICAL EXAMINATION: height is 5\' 3"  (1.6 m) and weight is 148 lb 12.8 oz (67.495 kg). Her temperature is 97.8  F (36.6 C). Her blood pressure is 125/85 and her pulse is 74.      erythema/radiation dermatitis in the treatment area. No moist desquamation.  ASSESSMENT: The patient is doing satisfactorily with treatment.  PLAN: We will continue with the patient's radiation treatment as planned.

## 2013-04-16 ENCOUNTER — Ambulatory Visit
Admission: RE | Admit: 2013-04-16 | Discharge: 2013-04-16 | Disposition: A | Discharge status: Home / Self Care | Payer: Medicare Other | Source: Ambulatory Visit | Attending: Radiation Oncology | Admitting: Radiation Oncology

## 2013-04-17 ENCOUNTER — Ambulatory Visit
Admission: RE | Admit: 2013-04-17 | Discharge: 2013-04-17 | Disposition: A | Discharge status: Home / Self Care | Payer: Medicare Other | Source: Ambulatory Visit | Attending: Radiation Oncology | Admitting: Radiation Oncology

## 2013-04-18 ENCOUNTER — Ambulatory Visit: Payer: Medicare Other

## 2013-04-18 ENCOUNTER — Ambulatory Visit
Admission: RE | Admit: 2013-04-18 | Discharge: 2013-04-18 | Disposition: A | Discharge status: Home / Self Care | Payer: Medicare Other | Source: Ambulatory Visit | Attending: Radiation Oncology | Admitting: Radiation Oncology

## 2013-04-19 ENCOUNTER — Ambulatory Visit
Admission: RE | Admit: 2013-04-19 | Discharge: 2013-04-19 | Disposition: A | Discharge status: Home / Self Care | Payer: Medicare Other | Source: Ambulatory Visit | Attending: Radiation Oncology | Admitting: Radiation Oncology

## 2013-04-19 ENCOUNTER — Ambulatory Visit (INDEPENDENT_AMBULATORY_CARE_PROVIDER_SITE_OTHER): Payer: Medicare Other

## 2013-04-19 ENCOUNTER — Ambulatory Visit: Payer: Medicare Other

## 2013-04-19 DIAGNOSIS — J309 Allergic rhinitis, unspecified: Secondary | ICD-10-CM

## 2013-04-20 ENCOUNTER — Ambulatory Visit
Admission: RE | Admit: 2013-04-20 | Discharge: 2013-04-20 | Disposition: A | Discharge status: Home / Self Care | Payer: Medicare Other | Source: Ambulatory Visit | Attending: Radiation Oncology | Admitting: Radiation Oncology

## 2013-04-20 ENCOUNTER — Ambulatory Visit: Payer: Medicare Other

## 2013-04-20 VITALS — BP 112/58 | HR 65 | Temp 98.0°F | Ht 63.0 in | Wt 150.1 lb

## 2013-04-20 DIAGNOSIS — C50219 Malignant neoplasm of upper-inner quadrant of unspecified female breast: Secondary | ICD-10-CM

## 2013-04-20 MED ORDER — RADIAPLEXRX EX GEL
Freq: Once | CUTANEOUS | Status: AC
  Administered 2013-04-20: 16:00:00 via TOPICAL

## 2013-04-20 NOTE — Progress Notes (Signed)
Department of Radiation Oncology  Phone:  [phone] Fax:        [phone]  Weekly Treatment Note    Name: Debra Lynn Date: 04/20/2013 MRN: [id number] DOB: [birth year]   Current dose: 58 Gy  Current fraction: 29   MEDICATIONS: Current Outpatient Prescriptions  Medication Sig Dispense Refill  . acetaminophen (TYLENOL) 500 MG tablet Take 1,000 mg by mouth every 6 (six) hours as needed. For pain.      . benzonatate (TESSALON) 100 MG capsule Take 100 mg by mouth 3 (three) times daily as needed for cough.      . Calcium Carbonate-Vitamin D (CALCIUM-VITAMIN D) 500-200 MG-UNIT per tablet Take 1 tablet by mouth daily.       . cephALEXin (KEFLEX) 250 MG capsule Take 1 capsule by mouth at bedtime.       . colestipol (COLESTID) 1 G tablet Take 1 g by mouth at bedtime.      . furosemide (LASIX) 40 MG tablet Take 1 tablet (40 mg total) by mouth every morning.  90 tablet  1  . hyaluronate sodium (RADIAPLEXRX) GEL Apply 1 application topically 2 (two) times daily.      Marland Kitchen levETIRAcetam (KEPPRA) 500 MG tablet Take 250 mg by mouth every 12 (twelve) hours.      . Multiple Vitamin (MULTIVITAMIN WITH MINERALS) TABS tablet Take 1 tablet by mouth daily.      . potassium chloride SA (K-DUR,KLOR-CON) 20 MEQ tablet Take 1 tablet by mouth at bedtime.       . pravastatin (PRAVACHOL) 80 MG tablet Take 80 mg by mouth at bedtime.      . Probiotic Product (ALIGN) 4 MG CAPS Take 4 mg by mouth daily.  30 capsule  0  . propranolol (INDERAL) 60 MG tablet Take 30 mg by mouth 2 (two) times daily. 1/2 tablet twice daily      . RESTASIS 0.05 % ophthalmic emulsion Place 1 drop into both eyes every 12 (twelve) hours.       Marland Kitchen warfarin (COUMADIN) 2.5 MG tablet Take 1.25-2.5 mg by mouth daily. Takes 1 tablet on Monday ,wednesday and Friday and 1/2 tablet all other days      . anastrozole (ARIMIDEX) 1 MG tablet Take 1 tablet (1 mg total) by mouth daily.  90 tablet  12  . pantoprazole (PROTONIX) 40 MG tablet Take 1  tablet by mouth daily.       No current facility-administered medications for this encounter.     ALLERGIES: Review of patient's allergies indicates no known allergies.   LABORATORY DATA:  Lab Results  Component Value Date   WBC 7.3 02/15/2013   HGB 13.3 02/15/2013   HCT 38.7 02/15/2013   MCV 86.6 02/15/2013   PLT 192 02/15/2013   Lab Results  Component Value Date   NA 141 02/15/2013   K 3.8 02/15/2013   CL 106 01/20/2013   CO2 24 02/15/2013   Lab Results  Component Value Date   ALT 17 02/15/2013   AST 21 02/15/2013   ALKPHOS 53 02/15/2013   BILITOT 0.42 02/15/2013     NARRATIVE: Debra Lynn was seen today for weekly treatment management. The chart was checked and the patient's films were reviewed. The patient is feeling well at this time. The skin has held up well. She is using her skin cream. No major changes.  PHYSICAL EXAMINATION: height is 5\' 3"  (1.6 m) and weight is 150 lb 1.6 oz (68.085 kg). Her temperature is 98  F (36.7 C). Her blood pressure is 112/58 and her pulse is 65.      increased radiation dermatitis seen in the treatment area. No moist desquamation.  ASSESSMENT: The patient is doing satisfactorily with treatment.  PLAN: We will continue with the patient's radiation treatment as planned. The patient has been given another tube of skin cream and will continue to use this. Her skin has held up quite well during treatment.

## 2013-04-20 NOTE — Progress Notes (Signed)
Debra Lynn has had 29 fractions to her right chest wall.  She denies pain but states she has occasional soreness in her right chest.  She reports fatigue.  She had an elevated bp today of 122/105.  She said she was registered wrong and had to wait for an hour and is frustrated about that.  The skin on her right chest is intact and red.  She is using radiaplex gel and needs a refill.  Another tube has been given.

## 2013-04-23 ENCOUNTER — Ambulatory Visit (INDEPENDENT_AMBULATORY_CARE_PROVIDER_SITE_OTHER): Payer: Medicare Other | Admitting: Pharmacist

## 2013-04-23 ENCOUNTER — Encounter: Payer: Self-pay | Admitting: Internal Medicine

## 2013-04-23 ENCOUNTER — Ambulatory Visit (INDEPENDENT_AMBULATORY_CARE_PROVIDER_SITE_OTHER): Payer: Medicare Other | Admitting: Internal Medicine

## 2013-04-23 ENCOUNTER — Encounter: Payer: Self-pay | Admitting: Radiation Oncology

## 2013-04-23 ENCOUNTER — Ambulatory Visit
Admission: RE | Admit: 2013-04-23 | Discharge: 2013-04-23 | Disposition: A | Discharge status: Home / Self Care | Payer: Medicare Other | Source: Ambulatory Visit | Attending: Radiation Oncology | Admitting: Radiation Oncology

## 2013-04-23 ENCOUNTER — Ambulatory Visit: Payer: Medicare Other

## 2013-04-23 VITALS — BP 125/73 | HR 63 | Ht 63.0 in | Wt 149.4 lb

## 2013-04-23 DIAGNOSIS — I10 Essential (primary) hypertension: Secondary | ICD-10-CM

## 2013-04-23 DIAGNOSIS — C7989 Secondary malignant neoplasm of other specified sites: Secondary | ICD-10-CM

## 2013-04-23 DIAGNOSIS — C50919 Malignant neoplasm of unspecified site of unspecified female breast: Secondary | ICD-10-CM

## 2013-04-23 DIAGNOSIS — I495 Sick sinus syndrome: Secondary | ICD-10-CM

## 2013-04-23 DIAGNOSIS — I4891 Unspecified atrial fibrillation: Secondary | ICD-10-CM

## 2013-04-23 DIAGNOSIS — Z950 Presence of cardiac pacemaker: Secondary | ICD-10-CM

## 2013-04-23 DIAGNOSIS — I6789 Other cerebrovascular disease: Secondary | ICD-10-CM

## 2013-04-23 LAB — POCT INR: INR: 2.2

## 2013-04-23 NOTE — Patient Instructions (Signed)
Your physician wants you to follow-up in: 6 months with device clinic and 12 months with Dr Allred You will receive a reminder letter in the mail two months in advance. If you don't receive a letter, please call our office to schedule the follow-up appointment.  

## 2013-04-24 ENCOUNTER — Encounter: Payer: Self-pay | Admitting: Internal Medicine

## 2013-04-24 LAB — MDC_IDC_ENUM_SESS_TYPE_INCLINIC
Battery Voltage: 2.75 V
Brady Statistic RA Percent Paced: 49 %
Implantable Pulse Generator Model: 5586
Implantable Pulse Generator Serial Number: 1908386
Lead Channel Impedance Value: 1141 Ohm
Lead Channel Impedance Value: 616 Ohm
Lead Channel Impedance Value: 925 Ohm
Lead Channel Pacing Threshold Amplitude: 1.25 V
Lead Channel Pacing Threshold Amplitude: 1.25 V
Lead Channel Pacing Threshold Amplitude: 2.25 V
Lead Channel Pacing Threshold Pulse Width: 0.5 ms
Lead Channel Pacing Threshold Pulse Width: 0.7 ms
Lead Channel Pacing Threshold Pulse Width: 0.8 ms
Lead Channel Sensing Intrinsic Amplitude: 0.5 mV
Lead Channel Sensing Intrinsic Amplitude: 8 mV
Lead Channel Setting Pacing Amplitude: 2.5 V
Lead Channel Setting Pacing Amplitude: 2.5 V
Lead Channel Setting Pacing Amplitude: 3.25 V
Lead Channel Setting Pacing Pulse Width: 0.7 ms
Lead Channel Setting Pacing Pulse Width: 0.8 ms
Lead Channel Setting Sensing Sensitivity: 4 mV
Lead Channel Setting Sensing Sensitivity: 4 mV

## 2013-04-24 NOTE — Progress Notes (Signed)
Debra Evener, MD: Primary Cardiologist:  [name] is a 80 y.o. female with a h/o complete heart block sp PPM with upgrade to a BiV pacemaker (SJM) by Dr Leonia Reeves in 2008 who presents today to establish care in the Electrophysiology device clinic.   The patient reports doing very well since having a pacemaker implanted and remains very active despite her age.  She has been diagnosed with recurrence of breast cancer and has just completed radiation to the R breast (same side as her pacemaker).   Today, she  denies symptoms of palpitations, chest pain, shortness of breath, orthopnea, PND, lower extremity edema, dizziness, presyncope, syncope, or neurologic sequela.  The patientis tolerating medications without difficulties and is otherwise without complaint today.   Past Medical History  Diagnosis Date  . Complete heart block   . Glaucoma   . Seasonal allergic rhinitis     Pos Skin Test 07-05-07  . GERD (gastroesophageal reflux disease)   . Atrial fibrillation     s/p AV nodal ablation/ PPM  . Hypertension   . Hyperlipidemia   . Arthritis KNEES &BACK  . Neuromuscular disorder SLIGHT TREMOR.  . Bleeding from breast     right  . Pericarditis     now "fluid around heart"  . Stroke 2010, 2014    x2, "big one in 2010"  . Seizures 2013    "think had one"  . Parkinsonism   . Headache(784.0)     sinus headaches  . Compression fracture a long time ago    "back fractured when heart stopped"  . Balance problem   . Orthostatic hypotension   . Light headedness   . CVA 05/07/2008    Qualifier: History of  By: Annamaria Boots MD, Kasandra Knudsen   . Cerebrovascular disease   . Cancer 12/2010    breast with recurrence s/p recent XRT  . Pure hypercholesterolemia   . Essential and other specified forms of tremor   . Esophageal reflux   . Thoracic aneurysm without mention of rupture   . 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 Fibrocystic breast   . Chronic cough     . H/O prior ablation treatment     Failed SVT ablation in 1980s  . Essential tremor   . Overactive bladder   . SOB (shortness of breath)   . Long term (current) use of anticoagulants    Past Surgical History  Procedure Laterality Date  . Cholecystectomy    . Vesicovaginal fistula closure w/ tah    . Ablation  1980s    Failed SVT ablation  . Flexible bronchoscopy w/ upper endoscopy      and swallowing evaluation  . Pacemaker insertion    . Knee arthroscopy Right     Arthroscopic for torn meniscus  . Cataract extraction Bilateral   . Abdominal hysterectomy  1975    pt. unsure if laparascopic/vaginal/abdominal  . Breast surgery Right 01/15/11    mastectomy  . Bi-ventricular pacemaker upgrade  01/2007    3rd device, upgraded to SJM Frontier II BiV pacemaker by Dr Leonia Reeves  . Mass excision Right 01/19/2013    Procedure: EXCISION CHEST WALL MASS;  Surgeon: [name], MD;  Location: [facility];  Service: General;  Laterality: Right;  . Eye surgery      For glaucoma  . Appendectomy      History   Social History  . Marital Status: Married    Spouse Name:  N/A    Number of Children: 2  . Years of Education: N/A   Occupational History  . retired Secretary/administrator    Social History Main Topics  . Smoking status: Never Smoker   . Smokeless tobacco: Never Used  . Alcohol Use: No  . Drug Use: No  . Sexual Activity: Not on file   Other Topics Concern  . Not on file   Social History Narrative  . No narrative on file    Family History  Problem Relation Age of Onset  . Asthma Brother   . Asthma Daughter   . Heart disease    . Cancer    . Heart failure Mother   . Cancer Father     brain  . Lymphoma Brother   . Diabetes Brother   . Hypertension Brother   . Heart attack Brother     No Known Allergies  Current Outpatient Prescriptions  Medication Sig Dispense Refill  . acetaminophen (TYLENOL) 500 MG tablet Take 1,000 mg by mouth every 6 (six) hours as needed. For  pain.      Marland Kitchen anastrozole (ARIMIDEX) 1 MG tablet Take 1 tablet (1 mg total) by mouth daily.  90 tablet  12  . benzonatate (TESSALON) 100 MG capsule Take 100 mg by mouth 3 (three) times daily as needed for cough.      . Calcium Carbonate-Vitamin D (CALCIUM-VITAMIN D) 500-200 MG-UNIT per tablet Take 1 tablet by mouth daily.       . cephALEXin (KEFLEX) 250 MG capsule Take 1 capsule by mouth at bedtime.       . colestipol (COLESTID) 1 G tablet Take 1 g by mouth at bedtime.      . furosemide (LASIX) 40 MG tablet Take 1 tablet (40 mg total) by mouth every morning.  90 tablet  1  . hyaluronate sodium (RADIAPLEXRX) GEL Apply 1 application topically 2 (two) times daily.      Marland Kitchen levETIRAcetam (KEPPRA) 500 MG tablet Take 250 mg by mouth every 12 (twelve) hours.      . Multiple Vitamin (MULTIVITAMIN WITH MINERALS) TABS tablet Take 1 tablet by mouth daily.      . pantoprazole (PROTONIX) 40 MG tablet Take 1 tablet by mouth daily.      . potassium chloride SA (K-DUR,KLOR-CON) 20 MEQ tablet Take 1 tablet by mouth at bedtime.       . pravastatin (PRAVACHOL) 80 MG tablet Take 80 mg by mouth at bedtime.      . Probiotic Product (ALIGN) 4 MG CAPS Take 4 mg by mouth daily.  30 capsule  0  . propranolol (INDERAL) 60 MG tablet Take 30 mg by mouth 2 (two) times daily.       . RESTASIS 0.05 % ophthalmic emulsion Place 1 drop into both eyes every 12 (twelve) hours.       Marland Kitchen warfarin (COUMADIN) 2.5 MG tablet Take 1.25-2.5 mg by mouth daily. Takes 1 tablet on Monday ,wednesday and Friday and 1/2 tablet all other days       No current facility-administered medications for this visit.    ROS- all systems are reviewed and negative except as per HPI  Physical Exam: Filed Vitals:   04/23/13 1420  BP: 125/73  Pulse: 63  Height: 5\' 3"  (1.6 m)  Weight: 149 lb 6.4 oz (67.767 kg)    GEN- The patient is elderly but pleasant appearing, alert and oriented x 3 today.   Head- normocephalic, atraumatic Eyes-  Sclera clear,  conjunctiva pink Ears- hearing intact Oropharynx- clear Neck- supple  Lungs- Clear to ausculation bilaterally, normal work of breathing Chest- R sided pacemaker pocket is well healed Heart- Regular rate and rhythm (paced) GI- soft, NT, ND, + BS Extremities- no clubbing, cyanosis, or edema MS- no significant deformity or atrophy Skin- no rash or lesion Psych- euthymic mood, full affect Neuro- strength and sensation are intact  Pacemaker interrogation- reviewed in detail today,  See PACEART report  Assessment and Plan:  1. Complete heart block Normal BiV pacemaker function See Pace Art report No changes today  2. Chronic systolic dysfunction EF has normalized with CRT (by echo 7/14) No changes today  3. HTN Stable No change required today  4. afib Rates are controlled Continue coumadin long term  Return to the device clinic in 6 months I will see again in 1 year Follow-up with Dr Irish Lack as scheduled

## 2013-04-26 ENCOUNTER — Ambulatory Visit (INDEPENDENT_AMBULATORY_CARE_PROVIDER_SITE_OTHER): Payer: Medicare Other

## 2013-04-26 DIAGNOSIS — J309 Allergic rhinitis, unspecified: Secondary | ICD-10-CM

## 2013-05-03 ENCOUNTER — Ambulatory Visit (INDEPENDENT_AMBULATORY_CARE_PROVIDER_SITE_OTHER): Payer: Medicare Other

## 2013-05-03 DIAGNOSIS — J309 Allergic rhinitis, unspecified: Secondary | ICD-10-CM

## 2013-05-08 NOTE — Progress Notes (Signed)
  Radiation Oncology         [phone] ________________________________  Name: Debra Lynn MRN: [id number]  Date: 04/23/2013  DOB: Dec 30, 1928  End of Treatment Note  Diagnosis:   Right-sided breast cancer     Indication for treatment:  Curative       Radiation treatment dates:   03/08/2013 through 04/23/2013  Site/dose:   The patient was treated to the right chest wall focusing on the mastectomy scar plus margin. The patient's area of treatment was impacted by the patient's pacemaker which was decided would be very challenging to move. The patient therefore received an en face electron treatment to 60 gray in 30 fractions.  Narrative: The patient tolerated radiation treatment relatively well.   The patient had some radiation skin change as expected during treatment. Overall her skin held up very well.  Plan: The patient has completed radiation treatment. The patient will return to radiation oncology clinic for routine followup in one month. I advised the patient to call or return sooner if they have any questions or concerns related to their recovery or treatment. ________________________________  Jodelle Gross, M.D., Ph.D.

## 2013-05-10 ENCOUNTER — Ambulatory Visit (INDEPENDENT_AMBULATORY_CARE_PROVIDER_SITE_OTHER): Payer: Medicare Other

## 2013-05-10 DIAGNOSIS — J309 Allergic rhinitis, unspecified: Secondary | ICD-10-CM

## 2013-05-17 ENCOUNTER — Ambulatory Visit (INDEPENDENT_AMBULATORY_CARE_PROVIDER_SITE_OTHER): Payer: Medicare Other

## 2013-05-17 DIAGNOSIS — J309 Allergic rhinitis, unspecified: Secondary | ICD-10-CM

## 2013-05-18 ENCOUNTER — Encounter: Payer: Self-pay | Admitting: Internal Medicine

## 2013-05-21 ENCOUNTER — Ambulatory Visit (INDEPENDENT_AMBULATORY_CARE_PROVIDER_SITE_OTHER): Payer: Medicare Other | Admitting: Pharmacist

## 2013-05-21 DIAGNOSIS — I4891 Unspecified atrial fibrillation: Secondary | ICD-10-CM

## 2013-05-21 DIAGNOSIS — I6789 Other cerebrovascular disease: Secondary | ICD-10-CM

## 2013-05-21 LAB — POCT INR: INR: 2.7

## 2013-05-24 ENCOUNTER — Ambulatory Visit: Payer: Medicare Other

## 2013-05-25 ENCOUNTER — Ambulatory Visit (INDEPENDENT_AMBULATORY_CARE_PROVIDER_SITE_OTHER): Payer: Medicare Other

## 2013-05-25 DIAGNOSIS — J309 Allergic rhinitis, unspecified: Secondary | ICD-10-CM

## 2013-05-31 ENCOUNTER — Ambulatory Visit (INDEPENDENT_AMBULATORY_CARE_PROVIDER_SITE_OTHER): Payer: Medicare Other

## 2013-05-31 ENCOUNTER — Ambulatory Visit
Admission: RE | Admit: 2013-05-31 | Discharge: 2013-05-31 | Disposition: A | Discharge status: Home / Self Care | Payer: Medicare Other | Source: Ambulatory Visit | Attending: Radiation Oncology | Admitting: Radiation Oncology

## 2013-05-31 ENCOUNTER — Encounter: Payer: Self-pay | Admitting: Radiation Oncology

## 2013-05-31 VITALS — BP 124/68 | HR 69 | Temp 97.9°F | Resp 20 | Ht 63.0 in | Wt 149.9 lb

## 2013-05-31 DIAGNOSIS — J309 Allergic rhinitis, unspecified: Secondary | ICD-10-CM

## 2013-05-31 DIAGNOSIS — C50219 Malignant neoplasm of upper-inner quadrant of unspecified female breast: Secondary | ICD-10-CM

## 2013-05-31 NOTE — Progress Notes (Addendum)
Follow up s/p radiation right breast / chest wall 03/08/13-04/23/13,  Well healed, still has erythema under axilla area, slight fatigued, occasional aching right chest wall still using radiaplex gel , appetite 75% is taking Arimidex 1mg  daily  3:03 PM

## 2013-05-31 NOTE — Progress Notes (Signed)
Radiation Oncology         [phone] ________________________________  Name: Debra Lynn MRN: [id number]  Date: 05/31/2013  DOB: [birth year]  Follow-Up Visit Note  CC: Debra Evener, MD  Debra Bookbinder, MD  Diagnosis:   right and-sided breast cancer  Interval Since Last Radiation:  Approximately one month   Narrative:  The patient returns today for routine follow-up.  She has done well overall since she finished treatment. The patient's skin has healed significantly since she completed her course of radiation treatment. She has begun anti-hormonal treatment.                              ALLERGIES:  has No Known Allergies.  Meds: Current Outpatient Prescriptions  Medication Sig Dispense Refill  . acetaminophen (TYLENOL) 500 MG tablet Take 1,000 mg by mouth every 6 (six) hours as needed. For pain.      Marland Kitchen anastrozole (ARIMIDEX) 1 MG tablet Take 1 tablet (1 mg total) by mouth daily.  90 tablet  12  . benzonatate (TESSALON) 100 MG capsule Take 100 mg by mouth 3 (three) times daily as needed for cough.      . Calcium Carbonate-Vitamin D (CALCIUM-VITAMIN D) 500-200 MG-UNIT per tablet Take 1 tablet by mouth daily.       . cephALEXin (KEFLEX) 250 MG capsule Take 1 capsule by mouth at bedtime.       . colestipol (COLESTID) 1 G tablet Take 1 g by mouth at bedtime.      . furosemide (LASIX) 40 MG tablet Take 1 tablet (40 mg total) by mouth every morning.  90 tablet  1  . hyaluronate sodium (RADIAPLEXRX) GEL Apply 1 application topically 2 (two) times daily.      Marland Kitchen levETIRAcetam (KEPPRA) 500 MG tablet Take 250 mg by mouth every 12 (twelve) hours.      . Multiple Vitamin (MULTIVITAMIN WITH MINERALS) TABS tablet Take 1 tablet by mouth daily.      . pantoprazole (PROTONIX) 40 MG tablet Take 1 tablet by mouth daily.      . potassium chloride SA (K-DUR,KLOR-CON) 20 MEQ tablet Take 1 tablet by mouth at bedtime.       . pravastatin (PRAVACHOL) 80 MG tablet Take 80 mg by mouth at bedtime.       . Probiotic Product (ALIGN) 4 MG CAPS Take 4 mg by mouth daily.  30 capsule  0  . propranolol (INDERAL) 60 MG tablet Take 30 mg by mouth 2 (two) times daily.       . RESTASIS 0.05 % ophthalmic emulsion Place 1 drop into both eyes every 12 (twelve) hours.       Marland Kitchen warfarin (COUMADIN) 2.5 MG tablet Take 1.25-2.5 mg by mouth daily. Takes 1 tablet on Monday ,wednesday and Friday and 1/2 tablet all other days       No current facility-administered medications for this encounter.    Physical Findings: The patient is in no acute distress. Patient is alert and oriented.  height is 5\' 3"  (1.6 m) and weight is 149 lb 14.4 oz (67.994 kg). Her oral temperature is 97.9 F (36.6 C). Her blood pressure is 124/68 and her pulse is 69. Her respiration is 20. .   The skin in the treatment area has healed satisfactorily, no areas of concern/moist desquamation/poor healing  Lab Findings: Lab Results  Component Value Date   WBC 7.3 02/15/2013   HGB 13.3 02/15/2013  HCT 38.7 02/15/2013   MCV 86.6 02/15/2013   PLT 192 02/15/2013     Radiographic Findings: No results found.  Impression:    The patient has done satisfactorily since finishing treatment. She has begun anti-hormonal treatment.  Plan:  The patient will followup in our clinic on a when necessary basis.   Debra Lynn, M.D., Ph.D.

## 2013-06-07 ENCOUNTER — Other Ambulatory Visit: Payer: Self-pay | Admitting: Interventional Cardiology

## 2013-06-07 ENCOUNTER — Ambulatory Visit (INDEPENDENT_AMBULATORY_CARE_PROVIDER_SITE_OTHER): Payer: Medicare Other

## 2013-06-07 DIAGNOSIS — J309 Allergic rhinitis, unspecified: Secondary | ICD-10-CM

## 2013-06-11 ENCOUNTER — Encounter: Payer: Self-pay | Admitting: Internal Medicine

## 2013-06-11 DIAGNOSIS — I4891 Unspecified atrial fibrillation: Secondary | ICD-10-CM

## 2013-06-14 ENCOUNTER — Telehealth: Payer: Self-pay | Admitting: *Deleted

## 2013-06-14 ENCOUNTER — Ambulatory Visit (INDEPENDENT_AMBULATORY_CARE_PROVIDER_SITE_OTHER): Payer: Medicare Other

## 2013-06-14 DIAGNOSIS — J309 Allergic rhinitis, unspecified: Secondary | ICD-10-CM

## 2013-06-14 NOTE — Telephone Encounter (Signed)
Message copied by Fernande Boyden on Thu Jun 14, 2013  4:45 PM ------      Message from: Jettie Booze      Created: Tue Jun 12, 2013 11:56 AM      Regarding: RE: refill antibiotic?       Would refer back to PCP.            ----- Message -----         From: Fernande Boyden, RN         Sent: 06/12/2013   9:56 AM           To: Jettie Booze, MD, Fernande Boyden, RN      Subject: refill antibiotic?                                       Pharmacy request refill on patient cephalexin cap for "prevention of UTI" associated with your name, do you want me to refill, thanks, Lovett Sox, RN patient care advocate       ------

## 2013-06-15 ENCOUNTER — Ambulatory Visit (INDEPENDENT_AMBULATORY_CARE_PROVIDER_SITE_OTHER): Payer: Medicare Other | Admitting: Interventional Cardiology

## 2013-06-15 ENCOUNTER — Encounter: Payer: Self-pay | Admitting: Interventional Cardiology

## 2013-06-15 ENCOUNTER — Ambulatory Visit (INDEPENDENT_AMBULATORY_CARE_PROVIDER_SITE_OTHER): Payer: Medicare Other | Admitting: *Deleted

## 2013-06-15 VITALS — BP 116/70 | HR 68 | Ht 63.0 in | Wt 151.0 lb

## 2013-06-15 DIAGNOSIS — I6789 Other cerebrovascular disease: Secondary | ICD-10-CM

## 2013-06-15 DIAGNOSIS — E785 Hyperlipidemia, unspecified: Secondary | ICD-10-CM

## 2013-06-15 DIAGNOSIS — R0602 Shortness of breath: Secondary | ICD-10-CM

## 2013-06-15 DIAGNOSIS — R42 Dizziness and giddiness: Secondary | ICD-10-CM

## 2013-06-15 DIAGNOSIS — I4891 Unspecified atrial fibrillation: Secondary | ICD-10-CM

## 2013-06-15 LAB — POCT INR: INR: 2.6

## 2013-06-15 NOTE — Progress Notes (Signed)
Patient ID: Debra Lynn, female   DOB: [birth year], 80 y.o.   MRN: [id number]    [place], [place] [place], [place]  [address] Phone: [phone] Fax:  [phone]  Date:  06/15/2013   ID:  Debra Lynn, DOB Jan 31, 1929, MRN [id number]  PCP:  Milagros Evener, MD      History of Present Illness: Debra Lynn is a 80 y.o. female with pacemaker and atrial fibrillation who had shortness of breath. BNP was mildly elevated. Lasix was started and she had some decrease in her shortness of breath. She still reports fatigue. She has issues with balance. She has fallen in the past. Atrial Fibrillation F/U:  Denies : Chest pain.  Leg edema.  Orthopnea.  Palpitations.  Syncope.     Wt Readings from Last 3 Encounters:  06/15/13 151 lb (68.493 kg)  05/31/13 149 lb 14.4 oz (67.994 kg)  04/23/13 149 lb 6.4 oz (67.767 kg)     Past Medical History  Diagnosis Date  . Complete heart block   . Glaucoma   . Seasonal allergic rhinitis     Pos Skin Test 07-05-07  . GERD (gastroesophageal reflux disease)   . Atrial fibrillation     s/p AV nodal ablation/ PPM  . Hypertension   . Hyperlipidemia   . Arthritis KNEES &BACK  . Neuromuscular disorder SLIGHT TREMOR.  . Bleeding from breast     right  . Pericarditis     now "fluid around heart"  . Stroke 2010, 2014    x2, "big one in 2010"  . Seizures 2013    "think had one"  . Parkinsonism   . Headache(784.0)     sinus headaches  . Compression fracture a long time ago    "back fractured when heart stopped"  . Balance problem   . Orthostatic hypotension   . Light headedness   . CVA 05/07/2008    Qualifier: History of  By: Annamaria Boots MD, Kasandra Knudsen   . Cerebrovascular disease   . Cancer 12/2010    breast with recurrence s/p recent XRT  . Pure hypercholesterolemia   . Essential and other specified forms of tremor   . Esophageal reflux   . Thoracic aneurysm without mention of rupture   . Renal neoplasm 10/03/10    TUMOR ON RIGHT  KIDNEY-OBSERVING  . Fibrocystic breast   . Chronic cough   . H/O prior ablation treatment     Failed SVT ablation in 1980s  . Essential tremor   . Overactive bladder   . SOB (shortness of breath)   . Long term (current) use of anticoagulants     Current Outpatient Prescriptions  Medication Sig Dispense Refill  . acetaminophen (TYLENOL) 500 MG tablet Take 1,000 mg by mouth every 6 (six) hours as needed. For pain.      Marland Kitchen anastrozole (ARIMIDEX) 1 MG tablet Take 1 tablet (1 mg total) by mouth daily.  90 tablet  12  . benzonatate (TESSALON) 100 MG capsule Take 100 mg by mouth 3 (three) times daily as needed for cough.      . Calcium Carbonate-Vitamin D (CALCIUM-VITAMIN D) 500-200 MG-UNIT per tablet Take 1 tablet by mouth daily.       . cephALEXin (KEFLEX) 250 MG capsule Take 1 capsule by mouth at bedtime.       . colestipol (COLESTID) 1 G tablet Take 1 g by mouth at bedtime.      . furosemide (LASIX) 40 MG tablet Take 1  tablet (40 mg total) by mouth every morning.  90 tablet  1  . hyaluronate sodium (RADIAPLEXRX) GEL Apply 1 application topically 2 (two) times daily.      Marland Kitchen levETIRAcetam (KEPPRA) 500 MG tablet Take 250 mg by mouth every 12 (twelve) hours.      . Multiple Vitamin (MULTIVITAMIN WITH MINERALS) TABS tablet Take 1 tablet by mouth daily.      . pantoprazole (PROTONIX) 40 MG tablet Take 1 tablet by mouth daily.      . potassium chloride SA (K-DUR,KLOR-CON) 20 MEQ tablet Take 1 tablet by mouth at bedtime.       . pravastatin (PRAVACHOL) 80 MG tablet Take 1 tablet by mouth  daily  30 tablet  6  . Probiotic Product (ALIGN) 4 MG CAPS Take 4 mg by mouth daily.  30 capsule  0  . propranolol (INDERAL) 60 MG tablet Take 30 mg by mouth 2 (two) times daily.       . RESTASIS 0.05 % ophthalmic emulsion Place 1 drop into both eyes every 12 (twelve) hours.       Marland Kitchen warfarin (COUMADIN) 2.5 MG tablet Take 1.25-2.5 mg by mouth daily. Takes 1 tablet on Monday ,wednesday and Friday and 1/2 tablet all  other days       No current facility-administered medications for this visit.    Allergies:   No Known Allergies  Social History:  The patient  reports that she has never smoked. She has never used smokeless tobacco. She reports that she does not drink alcohol or use illicit drugs.   Family History:  The patient's family history includes Asthma in her brother and daughter; Cancer in her father and another family member; Diabetes in her brother; Heart attack in her brother; Heart disease in an other family member; Heart failure in her mother; Hypertension in her brother; Lymphoma in her brother.   ROS:  Please see the history of present illness.  No nausea, vomiting.  No fevers, chills.  No focal weakness.  No dysuria.    All other systems reviewed and negative.   PHYSICAL EXAM: VS:  BP 116/70  Pulse 68  Ht 5\' 3"  (1.6 m)  Wt 151 lb (68.493 kg)  BMI 26.76 kg/m2  SpO2 98% Well nourished, well developed, in no acute distress HEENT: normal Neck: no JVD, no carotid bruits Cardiac:  normal S1, S2; RRR;  Lungs:  clear to auscultation bilaterally, no wheezing, rhonchi or rales Abd: soft, nontender, no hepatomegaly Ext: no edema Skin: warm and dry Neuro:   no focal abnormalities noted   ASSESSMENT AND PLAN:  SOB  Continue Lasix Tablet, 40 MG, 1 tablet, Orally, Once a day Continue Klor-Con M20 Tablet Extended Release, 20 MEQ, 1 tablet, Orally, once a day Notes: BNP was increased. we switched diuretic to Lasix. SHOB has improved slightly. Consider increasing furosemide to 80 mg daily. She feels shortness of breath is somewhat worse now. I think this is due to deconditioning. She was very inactive while receiving her radiation therapy.    2. Atrial fibrillation  Continue Propranolol HCl Tablet, 60 MG, 1 tablet, Orally, twice a day Notes: Regular rhythm now. Continue Coumadin for now. If falling becomes more frequent, would have to reconsider anticoagulation for stroke prevention. She has  had a prior stroke and would be a high risk for recurrent stroke.    3. Dizziness and giddiness  Notes: Dizziness improved.  She has avoided falling.  Blood pressures are stable, ranging in the 120-130  systolic. This may also be in part related to her parkinsonism. She will followup with neurology as well. OK to decrease losartan to 25 mg every other day. If BP stable , would d/c losartan.    4. Hyperlipidemia: TG increased. COntinue pravastatin. Never Started fish oil, 2gr BID. Freeze capsules.  Will need to recheck lipids. Preventive Medicine   Adult topics discussed:  Diet: healthy diet.  Exercise: 5 days a week, at least 30 minutes of aerobic exercise.     Preventive Medicine  Adult topics discussed:  Diet: healthy diet.  Exercise: 5 days a week, at least 30 minutes of aerobic exercise.      Signed, Mina Marble, MD, [facility] 06/15/2013 3:32 PM

## 2013-06-15 NOTE — Patient Instructions (Signed)
Your physician recommends that you return for a FASTING lipid profile and hepatic  Your physician wants you to follow-up in: 1 year with Dr. Irish Lack. You will receive a reminder letter in the mail two months in advance. If you don't receive a letter, please call our office to schedule the follow-up appointment.

## 2013-06-18 ENCOUNTER — Other Ambulatory Visit (HOSPITAL_BASED_OUTPATIENT_CLINIC_OR_DEPARTMENT_OTHER): Payer: Medicare Other

## 2013-06-18 ENCOUNTER — Ambulatory Visit (HOSPITAL_BASED_OUTPATIENT_CLINIC_OR_DEPARTMENT_OTHER): Payer: Medicare Other | Admitting: Oncology

## 2013-06-18 VITALS — BP 143/83 | HR 69 | Temp 97.7°F | Resp 18 | Ht 63.0 in | Wt 149.7 lb

## 2013-06-18 DIAGNOSIS — C50919 Malignant neoplasm of unspecified site of unspecified female breast: Secondary | ICD-10-CM

## 2013-06-18 DIAGNOSIS — C7989 Secondary malignant neoplasm of other specified sites: Secondary | ICD-10-CM

## 2013-06-18 DIAGNOSIS — C50219 Malignant neoplasm of upper-inner quadrant of unspecified female breast: Secondary | ICD-10-CM

## 2013-06-18 DIAGNOSIS — J984 Other disorders of lung: Secondary | ICD-10-CM

## 2013-06-18 DIAGNOSIS — C779 Secondary and unspecified malignant neoplasm of lymph node, unspecified: Secondary | ICD-10-CM

## 2013-06-18 DIAGNOSIS — Z170 Estrogen receptor positive status [ER+]: Secondary | ICD-10-CM

## 2013-06-18 DIAGNOSIS — I4891 Unspecified atrial fibrillation: Secondary | ICD-10-CM

## 2013-06-18 DIAGNOSIS — I639 Cerebral infarction, unspecified: Secondary | ICD-10-CM

## 2013-06-18 LAB — CBC WITH DIFFERENTIAL/PLATELET
BASO%: 0.6 % (ref 0.0–2.0)
Basophils Absolute: 0 10*3/uL (ref 0.0–0.1)
EOS%: 3.4 % (ref 0.0–7.0)
Eosinophils Absolute: 0.2 10*3/uL (ref 0.0–0.5)
HCT: 37.9 % (ref 34.8–46.6)
HGB: 12.5 g/dL (ref 11.6–15.9)
LYMPH%: 23.3 % (ref 14.0–49.7)
MCH: 29.1 pg (ref 25.1–34.0)
MCHC: 33 g/dL (ref 31.5–36.0)
MCV: 88.1 fL (ref 79.5–101.0)
MONO#: 0.5 10*3/uL (ref 0.1–0.9)
MONO%: 7.5 % (ref 0.0–14.0)
NEUT#: 4.7 10*3/uL (ref 1.5–6.5)
NEUT%: 65.2 % (ref 38.4–76.8)
Platelets: 160 10*3/uL (ref 145–400)
RBC: 4.29 10*6/uL (ref 3.70–5.45)
RDW: 14.4 % (ref 11.2–14.5)
WBC: 7.1 10*3/uL (ref 3.9–10.3)
lymph#: 1.7 10*3/uL (ref 0.9–3.3)

## 2013-06-18 LAB — COMPREHENSIVE METABOLIC PANEL (CC13)
ALT: 12 U/L (ref 0–55)
AST: 18 U/L (ref 5–34)
Albumin: 4.2 g/dL (ref 3.5–5.0)
Alkaline Phosphatase: 40 U/L (ref 40–150)
Anion Gap: 12 mEq/L — ABNORMAL HIGH (ref 3–11)
BUN: 19.2 mg/dL (ref 7.0–26.0)
CO2: 22 mEq/L (ref 22–29)
Calcium: 9.9 mg/dL (ref 8.4–10.4)
Chloride: 110 mEq/L — ABNORMAL HIGH (ref 98–109)
Creatinine: 0.9 mg/dL (ref 0.6–1.1)
Glucose: 100 mg/dl (ref 70–140)
Potassium: 3.9 mEq/L (ref 3.5–5.1)
Sodium: 144 mEq/L (ref 136–145)
Total Bilirubin: 0.36 mg/dL (ref 0.20–1.20)
Total Protein: 7.4 g/dL (ref 6.4–8.3)

## 2013-06-18 NOTE — Progress Notes (Signed)
ID: [place]   DOB: 1929/01/30  MR#: [id number]  INO#:[id number]  PCP: Debra Evener, MD GYN: [facility]ol[facility] OTHER SJ:GGEZMOQ [place]  BREAST CANCER HISTORY: The patient had routine screening mammography 12/15/2010 at [facility] She has heterogeneously dense breast tissue. There was a suggestion of a new mass in the upper inner quadrant of the right breast and she was brought back November 16 for right diagnostic mammography and ultrasonography. Spot compression views confirmed a 1.8 cm multilobulated mass which by ultrasound measured 2.1 cm, was ill-defined and hypoechoic. Biopsies November 19 ([place]) showed an invasive ductal carcinoma, grade 2, strongly e-cadherin positive, estrogen receptor 90% positive, progesterone receptor negative, with no HER to amplification by [place]. The MIB-1 was 25%.  The patient is not a candidate for MRI because she has a pacemaker in place. Accordingly she underwent [place], showing a solitary focus of increased uptake, measuring 2.3 cm. With this information she was seen at the [place] and a recommendation tomproceed with surgery was made. She had a Right simple mastectomy 01/15/2011. Her subsequent history is as detailed below.   INTERVAL HISTORY: The patient returns today for routine followup accompanied by her daughter [place]. Interval history is generally unremarkable. She is doing "good". As both her worse problem is she says she has "no major problems".  REVIEW OF SYSTEMS: She does admit to fatigue. She sometimes takes is 30-60 minute nap in the afternoon. She goes to bed at 9 PM gets up at 9 AM. When she does something around the house she has to take a little break. She has sinus headaches which tend to localize above the left eye. There are no visual symptoms and no nausea or vomiting. She takes Tylenol for these. Sometimes she has a little trouble swallowing. She keeps a little dry cough. Bowel movements are on the loose side. A detailed review of  systems was otherwise stable   PAST MEDICAL HISTORY: Past Medical History  Diagnosis Date  . Complete heart block   . Glaucoma   . Seasonal allergic rhinitis     Pos Skin Test 07-05-07  . GERD (gastroesophageal reflux disease)   . Atrial fibrillation     s/p AV nodal ablation/ PPM  . Hypertension   . Hyperlipidemia   . Arthritis KNEES &BACK  . Neuromuscular disorder SLIGHT TREMOR.  . Bleeding from breast     right  . Pericarditis     now "fluid around heart"  . Stroke 2010, 2014    x2, "big one in 2010"  . Seizures 2013    "think had one"  . Parkinsonism   . Headache(784.0)     sinus headaches  . Compression fracture a long time ago    "back fractured when heart stopped"  . Balance problem   . Orthostatic hypotension   . Light headedness   . CVA 05/07/2008    Qualifier: History of  By: Debra Boots MD, [place]   . Cerebrovascular disease   . Cancer 12/2010    breast with recurrence s/p recent XRT  . Pure hypercholesterolemia   . Essential and other specified forms of tremor   . Esophageal reflux   . Thoracic aneurysm without mention of rupture   . 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 Fibrocystic breast   . Chronic cough   . H/O prior ablation treatment     Failed SVT ablation in 1980s  . Essential tremor   . Overactive bladder   . SOB (shortness  of breath)   . Long term (current) use of anticoagulants     PAST SURGICAL HISTORY: Past Surgical History  Procedure Laterality Date  . Cholecystectomy    . Vesicovaginal fistula closure w/ tah    . Ablation  1980s    Failed SVT ablation  . Flexible bronchoscopy w/ upper endoscopy      and swallowing evaluation  . Pacemaker insertion    . Knee arthroscopy Right     Arthroscopic for torn meniscus  . Cataract extraction Bilateral   . Abdominal hysterectomy  1975    pt. unsure if laparascopic/vaginal/abdominal  . Breast surgery Right 01/15/11    mastectomy  . Bi-ventricular pacemaker upgrade   01/2007    3rd device, upgraded to [place] BiV pacemaker by [place]  . Mass excision Right 01/19/2013    Procedure: EXCISION CHEST WALL MASS;  Surgeon: Debra Bookbinder, MD;  Location: [place];  Service: General;  Laterality: Right;  . Eye surgery      For glaucoma  . Appendectomy      FAMILY HISTORY Family History  Problem Relation Age of Onset  . Asthma Brother   . Asthma Daughter   . Heart disease    . Cancer    . Heart failure Mother   . Cancer Father     brain  . Lymphoma Brother   . Diabetes Brother   . Hypertension Brother   . Heart attack Brother   The patient's father died at the age of 55 from central nervous system cancer. The patient's mother died at the age of 73. The patient had one brother who had a history of lymphoma, but survives.  GYNECOLOGIC HISTORY: GX P2. 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 Her husband [place] used to work for [place]. Daughter [place]bra [place] lives in [place] and is a retired Merchant navy officer. Debra Lynn is a widow of my former patient "Debra Lynn" [place].) Daughter [place] lives in [place] where she works as a Orthoptist. The patient has 2 grandsons. She is not a Ambulance person.    ADVANCED DIRECTIVES: in place  HEALTH MAINTENANCE: History  Substance Use Topics  . Smoking status: Never Smoker   . Smokeless tobacco: Never Used  . Alcohol Use: No     Colonoscopy:  PAP:  Bone density: [place] Jan 2013, T - 1.7 R fem neck  Lipid panel:  No Known Allergies  Current Outpatient Prescriptions  Medication Sig Dispense Refill  . acetaminophen (TYLENOL) 500 MG tablet Take 1,000 mg by mouth every 6 (six) hours as needed. For pain.      Marland Kitchen anastrozole (ARIMIDEX) 1 MG tablet Take 1 tablet (1 mg total) by mouth daily.  90 tablet  12  . benzonatate  (TESSALON) 100 MG capsule Take 100 mg by mouth 3 (three) times daily as needed for cough.      . Calcium Carbonate-Vitamin D (CALCIUM-VITAMIN D) 500-200 MG-UNIT per tablet Take 1 tablet by mouth daily.       . cephALEXin (KEFLEX) 250 MG capsule Take 1 capsule by mouth at bedtime.       . colestipol (COLESTID) 1 G tablet Take 1 g by mouth at bedtime.      . furosemide (LASIX) 40 MG tablet Take 1 tablet (40 mg total) by mouth every morning.  [place]  tablet  1  . hyaluronate sodium (RADIAPLEXRX) GEL Apply 1 application topically 2 (two) times daily.      Marland Kitchen levETIRAcetam (KEPPRA) 500 MG tablet Take 250 mg by mouth every 12 (twelve) hours.      . Multiple Vitamin (MULTIVITAMIN WITH MINERALS) TABS tablet Take 1 tablet by mouth daily.      . pantoprazole (PROTONIX) 40 MG tablet Take 1 tablet by mouth daily.      . potassium chloride SA (K-DUR,KLOR-CON) 20 MEQ tablet Take 1 tablet by mouth at bedtime.       . pravastatin (PRAVACHOL) 80 MG tablet Take 1 tablet by mouth  daily  30 tablet  6  . Probiotic Product (ALIGN) 4 MG CAPS Take 4 mg by mouth daily.  30 capsule  0  . propranolol (INDERAL) 60 MG tablet Take 30 mg by mouth 2 (two) times daily.       . RESTASIS 0.05 % ophthalmic emulsion Place 1 drop into both eyes every 12 (twelve) hours.       Marland Kitchen warfarin (COUMADIN) 2.5 MG tablet Take 1.25-2.5 mg by mouth daily. Takes 1 tablet on Monday ,wednesday and Friday and 1/2 tablet all other days       No current facility-administered medications for this visit.    OBJECTIVE: Elderly white woman in no acute distress Filed Vitals:   06/18/13 1416  BP: 143/83  Pulse: 69  Temp: 97.7 F (36.5 C)  Resp: 18     Body mass index is 26.52 kg/(m^2).    ECOG FS: 2  Sclerae unicteric, EOMs intact Oropharynx clear and moist No cervical or supraclavicular adenopathy Lungs no rales or rhonchi Heart regular rate and rhythm (paced) Abd soft, nontender, positive bowel sounds MSK no focal spinal tenderness Neuro:  Nonfocal; mild facial rigidity; moderate shuffling gait, speaks slowly but very clearly, is well oriented. Affect is pleasant Breasts: The right breast is status post mastectomy. There is no evidence of local recurrence.  LAB RESULTS: Lab Results  Component Value Date   WBC 7.1 06/18/2013   NEUTROABS 4.7 06/18/2013   HGB 12.5 06/18/2013   HCT 37.9 06/18/2013   MCV 88.1 06/18/2013   PLT 160 06/18/2013      Chemistry      Component Value Date/Time   NA 144 06/18/2013 1405   NA 140 01/20/2013 0615   K 3.9 06/18/2013 1405   K 3.6 01/20/2013 0615   CL 106 01/20/2013 0615   CL 104 03/23/2012 1329   CO2 22 06/18/2013 1405   CO2 24 01/20/2013 0615   BUN 19.2 06/18/2013 1405   BUN 11 01/20/2013 0615   CREATININE 0.9 06/18/2013 1405   CREATININE 0.74 01/20/2013 0615      Component Value Date/Time   CALCIUM 9.9 06/18/2013 1405   CALCIUM 8.8 01/20/2013 0615   ALKPHOS 40 06/18/2013 1405   ALKPHOS 41 08/23/2012 1928   AST 18 06/18/2013 1405   AST 23 08/23/2012 1928   ALT 12 06/18/2013 1405   ALT 13 08/23/2012 1928   BILITOT 0.36 06/18/2013 1405   BILITOT 0.3 08/23/2012 1928       Lab Results  Component Value Date   LABCA2 15 12/30/2010    No components found with this basename: [id number]     Recent Labs Lab 06/15/13 1442  INR 2.6    Urinalysis    Component Value Date/Time   COLORURINE YELLOW 08/23/2012 2052   APPEARANCEUR CLOUDY* 08/23/2012 2052   LABSPEC 1.010 08/23/2012 2052  PHURINE 7.0 08/23/2012 2052   GLUCOSEU NEGATIVE 08/23/2012 2052   HGBUR NEGATIVE 08/23/2012 2052   BILIRUBINUR NEGATIVE 08/23/2012 2052   KETONESUR NEGATIVE 08/23/2012 2052   PROTEINUR 30* 08/23/2012 2052   UROBILINOGEN 0.2 08/23/2012 2052   NITRITE NEGATIVE 08/23/2012 2052   LEUKOCYTESUR MODERATE* 08/23/2012 2052    STUDIES: No results found.  ASSESSMENT: 80 y.o. [place] woman status post simple mastectomy 01/15/2011 for a T2 NX (stage Lynn) invasive ductal carcinoma, grade 3, 100% estrogen receptor  positive, with a borderline MIB-1, progesterone receptor and HER-2 negative.  (1) not a candidate for tamoxifen due to cardiac issues and decided against aromatase inhibitors because of osteopenia present at baseline and concerns re. bisphosphonates  (2) local recurrence to the right chest wall documented by biopsy 01-2013 showing an invasive ductal carcinoma, grade 1 or 2, estrogen receptor strongly positive, progesterone receptor and HER-2 negative, with an MIB-1 of 28%.  (3) excision of the right chest wall mass 01/19/2013 with negative but close margins (the deep margin is skeletal muscle).  (4) status post radiation therapy to the right chest wall completed 04/23/2013  (5) Ct chest January 2015 shows small bilateral lung lesions many of which are longstanding; repeat chest CT DEC 2015 planned  (6) anastrozole started January 2015; osteopenia per bone density January 2013 with T -1.5 as lowest score   PLAN: Ms Rydberg is tolerating the anastrozole w/o any unusual side effects. The plan will be to continue this medication for 5 years. She is due to see me again in 6 months. Before that visit we will repeat a CT scan of the chest to follow the questionable lesions noted in January.  The patient has a good understanding of the overall plan. She agrees with it. She will call with any problems before her next visit here. Chauncey Cruel, MD   06/18/2013

## 2013-06-19 ENCOUNTER — Telehealth: Payer: Self-pay | Admitting: Oncology

## 2013-06-19 NOTE — Telephone Encounter (Signed)
s.w. pt adn advised on NOV appt...pt ok adn aware

## 2013-06-21 ENCOUNTER — Other Ambulatory Visit (INDEPENDENT_AMBULATORY_CARE_PROVIDER_SITE_OTHER): Payer: Medicare Other

## 2013-06-21 ENCOUNTER — Ambulatory Visit (INDEPENDENT_AMBULATORY_CARE_PROVIDER_SITE_OTHER): Payer: Medicare Other

## 2013-06-21 ENCOUNTER — Other Ambulatory Visit: Payer: Medicare Other

## 2013-06-21 DIAGNOSIS — E785 Hyperlipidemia, unspecified: Secondary | ICD-10-CM

## 2013-06-21 DIAGNOSIS — J309 Allergic rhinitis, unspecified: Secondary | ICD-10-CM

## 2013-06-21 LAB — LIPID PANEL
Cholesterol: 198 mg/dL (ref 0–200)
HDL: 62 mg/dL (ref >39.00)
LDL Cholesterol: 97 mg/dL (ref 0–99)
Total CHOL/HDL Ratio: 3
Triglycerides: 196 mg/dL — ABNORMAL HIGH (ref 0.0–149.0)
VLDL: 39.2 mg/dL (ref 0.0–40.0)

## 2013-06-21 LAB — HEPATIC FUNCTION PANEL
ALT: 17 U/L (ref 0–35)
AST: 18 U/L (ref 0–37)
Albumin: 4.2 g/dL (ref 3.5–5.2)
Alkaline Phosphatase: 34 U/L — ABNORMAL LOW (ref 39–117)
Bilirubin, Direct: 0 mg/dL (ref 0.0–0.3)
Total Bilirubin: 0.5 mg/dL (ref 0.2–1.2)
Total Protein: 7.5 g/dL (ref 6.0–8.3)

## 2013-06-28 ENCOUNTER — Ambulatory Visit (INDEPENDENT_AMBULATORY_CARE_PROVIDER_SITE_OTHER): Payer: Medicare Other

## 2013-06-28 DIAGNOSIS — J309 Allergic rhinitis, unspecified: Secondary | ICD-10-CM

## 2013-06-29 ENCOUNTER — Telehealth: Payer: Self-pay | Admitting: Cardiology

## 2013-06-29 DIAGNOSIS — E785 Hyperlipidemia, unspecified: Secondary | ICD-10-CM

## 2013-06-29 NOTE — Telephone Encounter (Signed)
Message copied by Alcario Drought on Fri Jun 29, 2013 11:40 AM ------      Message from: Bishop Limbo      Created: Tue Jun 26, 2013  4:54 PM       Continue current medications.  Recheck lipid panel and hepatic panel in 1 year.        Please notify patient, and set up labs. Thanks. ------

## 2013-06-29 NOTE — Telephone Encounter (Signed)
Future labs ordered.  

## 2013-07-03 ENCOUNTER — Encounter: Payer: Self-pay | Admitting: Internal Medicine

## 2013-07-05 ENCOUNTER — Ambulatory Visit (INDEPENDENT_AMBULATORY_CARE_PROVIDER_SITE_OTHER): Payer: Medicare Other

## 2013-07-05 DIAGNOSIS — J309 Allergic rhinitis, unspecified: Secondary | ICD-10-CM

## 2013-07-09 ENCOUNTER — Other Ambulatory Visit: Payer: Self-pay | Admitting: Interventional Cardiology

## 2013-07-12 ENCOUNTER — Ambulatory Visit (INDEPENDENT_AMBULATORY_CARE_PROVIDER_SITE_OTHER): Payer: Medicare Other

## 2013-07-12 DIAGNOSIS — J309 Allergic rhinitis, unspecified: Secondary | ICD-10-CM

## 2013-07-19 ENCOUNTER — Ambulatory Visit: Payer: Medicare Other

## 2013-07-20 ENCOUNTER — Ambulatory Visit (INDEPENDENT_AMBULATORY_CARE_PROVIDER_SITE_OTHER): Payer: Medicare Other

## 2013-07-20 DIAGNOSIS — J309 Allergic rhinitis, unspecified: Secondary | ICD-10-CM

## 2013-07-26 ENCOUNTER — Ambulatory Visit (INDEPENDENT_AMBULATORY_CARE_PROVIDER_SITE_OTHER): Payer: Medicare Other

## 2013-07-26 DIAGNOSIS — J309 Allergic rhinitis, unspecified: Secondary | ICD-10-CM

## 2013-07-27 ENCOUNTER — Ambulatory Visit (INDEPENDENT_AMBULATORY_CARE_PROVIDER_SITE_OTHER): Payer: Medicare Other | Admitting: Pharmacist

## 2013-07-27 DIAGNOSIS — I4891 Unspecified atrial fibrillation: Secondary | ICD-10-CM

## 2013-07-27 DIAGNOSIS — I6789 Other cerebrovascular disease: Secondary | ICD-10-CM

## 2013-07-27 LAB — POCT INR: INR: 3.1

## 2013-08-02 ENCOUNTER — Ambulatory Visit (INDEPENDENT_AMBULATORY_CARE_PROVIDER_SITE_OTHER): Payer: Medicare Other

## 2013-08-02 DIAGNOSIS — J309 Allergic rhinitis, unspecified: Secondary | ICD-10-CM

## 2013-08-09 ENCOUNTER — Ambulatory Visit (INDEPENDENT_AMBULATORY_CARE_PROVIDER_SITE_OTHER): Payer: Medicare Other

## 2013-08-09 DIAGNOSIS — J309 Allergic rhinitis, unspecified: Secondary | ICD-10-CM

## 2013-08-10 ENCOUNTER — Encounter (INDEPENDENT_AMBULATORY_CARE_PROVIDER_SITE_OTHER): Payer: Self-pay | Admitting: General Surgery

## 2013-08-10 ENCOUNTER — Ambulatory Visit (INDEPENDENT_AMBULATORY_CARE_PROVIDER_SITE_OTHER): Payer: Medicare Other | Admitting: General Surgery

## 2013-08-10 VITALS — BP 122/78 | HR 78 | Temp 98.0°F | Ht 62.0 in | Wt 149.0 lb

## 2013-08-10 DIAGNOSIS — C50219 Malignant neoplasm of upper-inner quadrant of unspecified female breast: Secondary | ICD-10-CM

## 2013-08-10 DIAGNOSIS — C50211 Malignant neoplasm of upper-inner quadrant of right female breast: Secondary | ICD-10-CM

## 2013-08-10 NOTE — Progress Notes (Signed)
Subjective:     Patient ID: [place], female   DOB: 1928/11/21, 80 y.o.   MRN: [id number]  HPI 48 yof who underwent right simple mastectomy for stage II IDC, grade III, 100% er positive, pr neg, her2 negative.  She had chest wall recurrence with excision of IDC and same characteristics.  She completed radiation therapy. While on radiotherapy I excised another right chest wall mass that was a squamous cell cancer. She is now on anastrozole and tolerating this well.  She has no complaints and is here with her daughter today.  Review of Systems    Objective:   Physical Exam  Vitals reviewed. Constitutional: She appears well-developed and well-nourished.  Neck: Neck supple.  Pulmonary/Chest: Left breast exhibits no inverted nipple, no mass, no nipple discharge, no skin change and no tenderness.    Lymphadenopathy:    She has no cervical adenopathy.    She has no axillary adenopathy.       Right: No supraclavicular adenopathy present.       Left: No supraclavicular adenopathy present.       Assessment:     Stage II breast cancer with cw recurrence  Right chest wall scc    Plan:     She is really doing well.  Exam is normal and as expected, tolerating AE well.  I can see her back in one year or sooner if she has any problems.  She will continue to do her own exams and follow up with Dr Jana Hakim as scheduled

## 2013-08-16 ENCOUNTER — Ambulatory Visit (INDEPENDENT_AMBULATORY_CARE_PROVIDER_SITE_OTHER): Payer: Medicare Other

## 2013-08-16 DIAGNOSIS — J309 Allergic rhinitis, unspecified: Secondary | ICD-10-CM

## 2013-08-23 ENCOUNTER — Ambulatory Visit (INDEPENDENT_AMBULATORY_CARE_PROVIDER_SITE_OTHER): Payer: Medicare Other | Admitting: *Deleted

## 2013-08-23 ENCOUNTER — Ambulatory Visit (INDEPENDENT_AMBULATORY_CARE_PROVIDER_SITE_OTHER): Payer: Medicare Other

## 2013-08-23 DIAGNOSIS — J309 Allergic rhinitis, unspecified: Secondary | ICD-10-CM

## 2013-08-23 DIAGNOSIS — I4891 Unspecified atrial fibrillation: Secondary | ICD-10-CM

## 2013-08-23 DIAGNOSIS — I6789 Other cerebrovascular disease: Secondary | ICD-10-CM

## 2013-08-23 LAB — POCT INR: INR: 3.5

## 2013-08-30 ENCOUNTER — Ambulatory Visit (INDEPENDENT_AMBULATORY_CARE_PROVIDER_SITE_OTHER): Payer: Medicare Other

## 2013-08-30 DIAGNOSIS — J309 Allergic rhinitis, unspecified: Secondary | ICD-10-CM

## 2013-09-06 ENCOUNTER — Ambulatory Visit (INDEPENDENT_AMBULATORY_CARE_PROVIDER_SITE_OTHER): Payer: Medicare Other

## 2013-09-06 ENCOUNTER — Other Ambulatory Visit: Payer: Self-pay | Admitting: Cardiology

## 2013-09-06 DIAGNOSIS — I4891 Unspecified atrial fibrillation: Secondary | ICD-10-CM

## 2013-09-06 DIAGNOSIS — J309 Allergic rhinitis, unspecified: Secondary | ICD-10-CM

## 2013-09-06 DIAGNOSIS — I6789 Other cerebrovascular disease: Secondary | ICD-10-CM

## 2013-09-06 LAB — POCT INR: INR: 2.8

## 2013-09-06 MED ORDER — PRAVASTATIN SODIUM 80 MG PO TABS: ORAL_TABLET | ORAL | Status: DC

## 2013-09-06 MED ORDER — FUROSEMIDE 40 MG PO TABS: 40.0000 mg | ORAL_TABLET | Freq: Every morning | ORAL | Status: DC

## 2013-09-06 MED ORDER — PROPRANOLOL HCL 60 MG PO TABS: 30.0000 mg | ORAL_TABLET | Freq: Two times a day (BID) | ORAL | Status: DC

## 2013-09-10 DIAGNOSIS — I495 Sick sinus syndrome: Secondary | ICD-10-CM

## 2013-09-11 ENCOUNTER — Encounter: Payer: Self-pay | Admitting: Internal Medicine

## 2013-09-13 ENCOUNTER — Ambulatory Visit (INDEPENDENT_AMBULATORY_CARE_PROVIDER_SITE_OTHER): Payer: Medicare Other

## 2013-09-13 ENCOUNTER — Ambulatory Visit: Payer: Medicare Other

## 2013-09-13 DIAGNOSIS — J309 Allergic rhinitis, unspecified: Secondary | ICD-10-CM

## 2013-09-20 ENCOUNTER — Ambulatory Visit (INDEPENDENT_AMBULATORY_CARE_PROVIDER_SITE_OTHER): Payer: Medicare Other

## 2013-09-20 DIAGNOSIS — J309 Allergic rhinitis, unspecified: Secondary | ICD-10-CM

## 2013-09-26 ENCOUNTER — Encounter: Payer: Self-pay | Admitting: Internal Medicine

## 2013-09-27 ENCOUNTER — Ambulatory Visit (INDEPENDENT_AMBULATORY_CARE_PROVIDER_SITE_OTHER): Payer: Medicare Other

## 2013-09-27 DIAGNOSIS — J309 Allergic rhinitis, unspecified: Secondary | ICD-10-CM

## 2013-10-04 ENCOUNTER — Ambulatory Visit (INDEPENDENT_AMBULATORY_CARE_PROVIDER_SITE_OTHER): Payer: Medicare Other | Admitting: Pharmacist

## 2013-10-04 ENCOUNTER — Ambulatory Visit (INDEPENDENT_AMBULATORY_CARE_PROVIDER_SITE_OTHER): Payer: Medicare Other

## 2013-10-04 DIAGNOSIS — I4891 Unspecified atrial fibrillation: Secondary | ICD-10-CM

## 2013-10-04 DIAGNOSIS — J309 Allergic rhinitis, unspecified: Secondary | ICD-10-CM

## 2013-10-04 DIAGNOSIS — I6789 Other cerebrovascular disease: Secondary | ICD-10-CM

## 2013-10-04 LAB — POCT INR: INR: 2.7

## 2013-10-11 ENCOUNTER — Ambulatory Visit (INDEPENDENT_AMBULATORY_CARE_PROVIDER_SITE_OTHER): Payer: Medicare Other

## 2013-10-11 DIAGNOSIS — J309 Allergic rhinitis, unspecified: Secondary | ICD-10-CM

## 2013-10-18 ENCOUNTER — Ambulatory Visit (INDEPENDENT_AMBULATORY_CARE_PROVIDER_SITE_OTHER): Payer: Medicare Other

## 2013-10-18 DIAGNOSIS — J309 Allergic rhinitis, unspecified: Secondary | ICD-10-CM

## 2013-10-22 ENCOUNTER — Ambulatory Visit (INDEPENDENT_AMBULATORY_CARE_PROVIDER_SITE_OTHER): Payer: Medicare Other | Admitting: *Deleted

## 2013-10-22 DIAGNOSIS — I6350 Cerebral infarction due to unspecified occlusion or stenosis of unspecified cerebral artery: Secondary | ICD-10-CM

## 2013-10-22 DIAGNOSIS — I482 Chronic atrial fibrillation, unspecified: Secondary | ICD-10-CM

## 2013-10-22 DIAGNOSIS — I4891 Unspecified atrial fibrillation: Secondary | ICD-10-CM

## 2013-10-22 DIAGNOSIS — I639 Cerebral infarction, unspecified: Secondary | ICD-10-CM

## 2013-10-22 LAB — MDC_IDC_ENUM_SESS_TYPE_INCLINIC
Battery Remaining Longevity: 1.5
Brady Statistic RA Percent Paced: 55 %
Brady Statistic RV Percent Paced: 99 %
Implantable Pulse Generator Model: 5586
Implantable Pulse Generator Serial Number: 1908386
Lead Channel Impedance Value: 1074 Ohm
Lead Channel Impedance Value: 1228 Ohm
Lead Channel Impedance Value: 640 Ohm
Lead Channel Pacing Threshold Amplitude: 0.5 V
Lead Channel Pacing Threshold Amplitude: 1.5 V
Lead Channel Pacing Threshold Amplitude: 2.75 V
Lead Channel Pacing Threshold Pulse Width: 0.4 ms
Lead Channel Pacing Threshold Pulse Width: 0.7 ms
Lead Channel Pacing Threshold Pulse Width: 0.8 ms
Lead Channel Sensing Intrinsic Amplitude: 2.5 mV
Lead Channel Sensing Intrinsic Amplitude: 4.2 mV
Lead Channel Setting Pacing Amplitude: 2.5 V
Lead Channel Setting Pacing Amplitude: 2.5 V
Lead Channel Setting Pacing Amplitude: 3.25 V
Lead Channel Setting Pacing Pulse Width: 0.7 ms
Lead Channel Setting Pacing Pulse Width: 0.8 ms
Lead Channel Setting Sensing Sensitivity: 4 mV
Lead Channel Setting Sensing Sensitivity: 4 mV

## 2013-10-22 NOTE — Progress Notes (Signed)
CRT-P device check in clinic. Normal device function. Thresholds, sensing, impedance consistent with previous measurements. Histograms appropriate for patient and level of activity. 9 mode switches (<1%)--no EGMs. No ventricular high rate episodes. Patient bi-ventricularly pacing 99% of the time. Device programmed with appropriate safety margins.  Estimated longevity 1.5 years. Patient will follow up with JA in 6 months.

## 2013-10-24 ENCOUNTER — Encounter: Payer: Self-pay | Admitting: Internal Medicine

## 2013-10-25 ENCOUNTER — Ambulatory Visit (INDEPENDENT_AMBULATORY_CARE_PROVIDER_SITE_OTHER): Payer: Medicare Other

## 2013-10-25 DIAGNOSIS — Z23 Encounter for immunization: Secondary | ICD-10-CM

## 2013-10-25 DIAGNOSIS — J309 Allergic rhinitis, unspecified: Secondary | ICD-10-CM

## 2013-10-29 ENCOUNTER — Other Ambulatory Visit: Payer: Self-pay | Admitting: Internal Medicine

## 2013-10-30 ENCOUNTER — Telehealth: Payer: Self-pay | Admitting: Internal Medicine

## 2013-10-30 NOTE — Telephone Encounter (Signed)
Called and spoke with [place] and she is aware of CY recs.  She will fill the medication for 90 day supply with 3 refills. Nothing further is needed.

## 2013-10-30 NOTE — Telephone Encounter (Signed)
[place] w/ [facility] calling to question the rx sent for Tessalon perles on 10/29/13 Quantity is for #200 w/ 3 refills This refill was sent electronically from the original rx given at the 10.20.15 ov w/ CY [place] @ [facility] call back = [phone]  Dr Annamaria Boots please advise, thank you. Patient Instructions     Script sent for benzonatate perles  Sample Dymista nasal spray 1-2 puffs each nostril once daily at bedtime. We can prescribe a cheaper spray called Astelin for occasional use if you like this sample.

## 2013-10-30 NOTE — Telephone Encounter (Signed)
Probably this was to be a 3 month Rx. Give 30/ month = 90 for 3 months, x 3 refills,   1 3 times daily if needed

## 2013-11-01 ENCOUNTER — Ambulatory Visit (INDEPENDENT_AMBULATORY_CARE_PROVIDER_SITE_OTHER): Payer: Medicare Other | Admitting: *Deleted

## 2013-11-01 ENCOUNTER — Ambulatory Visit (INDEPENDENT_AMBULATORY_CARE_PROVIDER_SITE_OTHER): Payer: Medicare Other

## 2013-11-01 DIAGNOSIS — I4891 Unspecified atrial fibrillation: Secondary | ICD-10-CM

## 2013-11-01 DIAGNOSIS — I6789 Other cerebrovascular disease: Secondary | ICD-10-CM

## 2013-11-01 DIAGNOSIS — J309 Allergic rhinitis, unspecified: Secondary | ICD-10-CM

## 2013-11-01 LAB — POCT INR: INR: 1.8

## 2013-11-06 ENCOUNTER — Ambulatory Visit (INDEPENDENT_AMBULATORY_CARE_PROVIDER_SITE_OTHER): Payer: Medicare Other

## 2013-11-06 DIAGNOSIS — J309 Allergic rhinitis, unspecified: Secondary | ICD-10-CM

## 2013-11-08 ENCOUNTER — Ambulatory Visit (INDEPENDENT_AMBULATORY_CARE_PROVIDER_SITE_OTHER): Payer: Medicare Other

## 2013-11-08 DIAGNOSIS — J309 Allergic rhinitis, unspecified: Secondary | ICD-10-CM

## 2013-11-15 ENCOUNTER — Ambulatory Visit (INDEPENDENT_AMBULATORY_CARE_PROVIDER_SITE_OTHER): Payer: Medicare Other

## 2013-11-15 DIAGNOSIS — J309 Allergic rhinitis, unspecified: Secondary | ICD-10-CM

## 2013-11-22 ENCOUNTER — Encounter: Payer: Self-pay | Admitting: Internal Medicine

## 2013-11-22 ENCOUNTER — Ambulatory Visit (INDEPENDENT_AMBULATORY_CARE_PROVIDER_SITE_OTHER): Payer: Medicare Other | Admitting: Internal Medicine

## 2013-11-22 ENCOUNTER — Ambulatory Visit (INDEPENDENT_AMBULATORY_CARE_PROVIDER_SITE_OTHER): Payer: Medicare Other

## 2013-11-22 VITALS — BP 118/82 | HR 69 | Ht 65.0 in | Wt 149.4 lb

## 2013-11-22 DIAGNOSIS — R059 Cough, unspecified: Secondary | ICD-10-CM

## 2013-11-22 DIAGNOSIS — R05 Cough: Secondary | ICD-10-CM

## 2013-11-22 DIAGNOSIS — J309 Allergic rhinitis, unspecified: Secondary | ICD-10-CM

## 2013-11-22 DIAGNOSIS — J301 Allergic rhinitis due to pollen: Secondary | ICD-10-CM

## 2013-11-22 NOTE — Patient Instructions (Signed)
We can try getting your allergy shot every other week  Please call if needed

## 2013-11-22 NOTE — Progress Notes (Signed)
11/06/10- 80 year old female never smoker followed for allergic rhinosinusitis, chronic cough, complicated by CVA, GERD, pacemaker, HBP.Marland Kitchen daughter here Last here -11/07/2009 She got flu vaccine. Continues allergy vaccine at 1-10 without problem. Cough has persisted over the last few months. It occasionally wakes her but she takes a little Tussionex each night to permit the per sleep. Occasional antihistamine. They recognize no relationship to whether or external factors. She may choke a little sometimes while swallowing but it is not much. Breathing is otherwise comfortable. She had a swallowing evaluation years ago. Does not recognize reflux or postnasal drip or wheeze. Since last here had pacemaker placed.  05/27/11- 80 year old female never smoker followed for allergic rhinosinusitis, chronic cough, complicated by CVA, GERD, pacemaker, HBP.Marland Kitchen daughter here Since last here had a right mastectomy without radiation or chemotherapy, on December 14. No respiratory complications with surgery. Has been wheezing in the past month, mostly mild and with exertion. Modified barium swallow 11/09/2010 showed poor esophageal emptying with a reflux cough. CXR 01/13/2011-right pacemaker. Clear lungs. She remains on chronic propranolol. Stopping it did not relieve her cough. She does use Tussionex.  11/20/12- 80 year old female never smoker followed for allergic rhinosinusitis, chronic cough, complicated by CVA, GERD, pacemaker, HBP.Marland Kitchen daughter here Follows For: Dry cough ( some better with Tessalon) - Runny nose for past 2 days - Some sob with exertion -  Has had another CVA. Hx glaucoma sgy.   11/22/13- 80 year old female never smoker followed for allergic rhinosinusitis, chronic cough, lung nodules, complicated by CVA, GERD, pacemaker/ AFib, R mastectomy, HBP.Mar[place] daughter here FOLLOW FOR:  Yearly follow up for Allergies    Daughter here    Had flu vax CT had shown small lung nodules- Dr Jana Hakim scheduling f/u CT  Dec, 2015.  Slight nasal drip, Cough much better w Tessalon.  Denies sinus infections- credits allergy vaccine 1:10 GH. We discussed how to assess and can try changing to every other week. CT chest 02/21/13 IMPRESSION:  1. Small lung nodules. The more discrete nodules were present  previously, but have mildly increased in size since the CT dated  06/18/2010. Smaller semi-solid nodules noted on the current exam  were not convincingly present previously.  2. No other evidence suggesting metastatic disease within the chest.  Electronically Signed  By: Lajean Manes M.D.  On: 02/21/2013 14:58  ROS-Per HPI Constitutional:   No-   weight loss, night sweats, fevers, chills, fatigue, lassitude. HEENT:   No-  headaches, difficulty swallowing, tooth/dental problems, sore throat,       No-  sneezing, itching, ear ache, nasal congestion, +post nasal drip,  CV:  No-   chest pain, orthopnea, PND, swelling in lower extremities, anasarca, dizziness, palpitations Resp: No-   shortness of breath with exertion or at rest.              No-   productive cough,  + non-productive cough,  No-  coughing up of blood.              No-   change in color of mucus.  + wheezing.   Skin: No-   rash or lesions. GI:  No-   heartburn, indigestion, abdominal pain, nausea, vomiting,  GU:  MS:  No-   joint pain or swelling.   Neuro- nothing unusual Psych:  No- change in mood or affect. No depression or anxiety.  No memory loss.   OBJ General- Alert, Oriented, Affect-appropriate, Distress- none acute Skin- rash-none, lesions- none, excoriation- none Lymphadenopathy- none Head- atraumatic  Eyes- Gross vision intact, PERRLA, conjunctivae clear secretions            Ears- Hearing, canals-normal            Nose- Clear, no-Septal dev, mucus, polyps, erosion, perforation             Throat- Mallampati III , mucosa clear , drainage- none, tonsils- atrophic. Hesitant speech                   without stridor Neck-  flexible , trachea midline, no stridor , thyroid nl, carotid no bruit Chest - symmetrical excursion , unlabored           Heart/CV- RRR , no murmur , no gallop  , no rub, nl s1 s2                           - JVD- none , edema- none, stasis changes- none, varices- none           Lung- clear to P&A, wheeze- none, cough- none , dullness-none, rub- none           Chest wall- pacemaker on right Abd-  Br/ Gen/ Rectal- Not done, not indicated Extrem- cyanosis- none, clubbing, none, atrophy- none, strength- nl Neuro- mild tremor

## 2013-11-23 ENCOUNTER — Encounter: Payer: Self-pay | Admitting: Internal Medicine

## 2013-11-23 ENCOUNTER — Ambulatory Visit (INDEPENDENT_AMBULATORY_CARE_PROVIDER_SITE_OTHER): Payer: Medicare Other | Admitting: *Deleted

## 2013-11-23 DIAGNOSIS — I6789 Other cerebrovascular disease: Secondary | ICD-10-CM

## 2013-11-23 DIAGNOSIS — I4891 Unspecified atrial fibrillation: Secondary | ICD-10-CM

## 2013-11-23 LAB — POCT INR: INR: 2.3

## 2013-11-23 NOTE — Assessment & Plan Note (Signed)
She continues 1:10 GH. We discussed when to quit, at age 80. Plan- increase interval to every other week through the winter. Reconsider in the spring

## 2013-11-23 NOTE — Assessment & Plan Note (Signed)
MBS 11/09/10- esophageal dysmotility with distal pooling and reflux cough/ cyclical cough. Cough is somewhat better currently.

## 2013-12-03 ENCOUNTER — Encounter: Payer: Self-pay | Admitting: Internal Medicine

## 2013-12-06 ENCOUNTER — Ambulatory Visit (INDEPENDENT_AMBULATORY_CARE_PROVIDER_SITE_OTHER): Payer: Medicare Other

## 2013-12-06 DIAGNOSIS — J309 Allergic rhinitis, unspecified: Secondary | ICD-10-CM

## 2013-12-10 ENCOUNTER — Encounter: Payer: Self-pay | Admitting: Internal Medicine

## 2013-12-10 DIAGNOSIS — I442 Atrioventricular block, complete: Secondary | ICD-10-CM

## 2013-12-11 ENCOUNTER — Other Ambulatory Visit (HOSPITAL_BASED_OUTPATIENT_CLINIC_OR_DEPARTMENT_OTHER): Payer: Medicare Other

## 2013-12-11 ENCOUNTER — Ambulatory Visit (HOSPITAL_COMMUNITY)
Admission: RE | Admit: 2013-12-11 | Discharge: 2013-12-11 | Disposition: A | Discharge status: Home / Self Care | Payer: Medicare Other | Source: Ambulatory Visit | Attending: Oncology | Admitting: Oncology

## 2013-12-11 DIAGNOSIS — J8410 Pulmonary fibrosis, unspecified: Secondary | ICD-10-CM | POA: Diagnosis not present

## 2013-12-11 DIAGNOSIS — C44501 Unspecified malignant neoplasm of skin of breast: Secondary | ICD-10-CM

## 2013-12-11 DIAGNOSIS — R918 Other nonspecific abnormal finding of lung field: Secondary | ICD-10-CM | POA: Insufficient documentation

## 2013-12-11 DIAGNOSIS — I712 Thoracic aortic aneurysm, without rupture: Secondary | ICD-10-CM | POA: Insufficient documentation

## 2013-12-11 DIAGNOSIS — C7989 Secondary malignant neoplasm of other specified sites: Secondary | ICD-10-CM

## 2013-12-11 DIAGNOSIS — C50219 Malignant neoplasm of upper-inner quadrant of unspecified female breast: Secondary | ICD-10-CM | POA: Insufficient documentation

## 2013-12-11 DIAGNOSIS — K760 Fatty (change of) liver, not elsewhere classified: Secondary | ICD-10-CM | POA: Diagnosis not present

## 2013-12-11 DIAGNOSIS — I4891 Unspecified atrial fibrillation: Secondary | ICD-10-CM

## 2013-12-11 DIAGNOSIS — I517 Cardiomegaly: Secondary | ICD-10-CM | POA: Insufficient documentation

## 2013-12-11 DIAGNOSIS — I639 Cerebral infarction, unspecified: Secondary | ICD-10-CM

## 2013-12-11 DIAGNOSIS — Z9049 Acquired absence of other specified parts of digestive tract: Secondary | ICD-10-CM | POA: Diagnosis not present

## 2013-12-11 DIAGNOSIS — J984 Other disorders of lung: Secondary | ICD-10-CM | POA: Insufficient documentation

## 2013-12-11 DIAGNOSIS — K449 Diaphragmatic hernia without obstruction or gangrene: Secondary | ICD-10-CM | POA: Insufficient documentation

## 2013-12-11 DIAGNOSIS — C50919 Malignant neoplasm of unspecified site of unspecified female breast: Secondary | ICD-10-CM

## 2013-12-11 DIAGNOSIS — Z9011 Acquired absence of right breast and nipple: Secondary | ICD-10-CM | POA: Diagnosis not present

## 2013-12-11 DIAGNOSIS — R079 Chest pain, unspecified: Secondary | ICD-10-CM | POA: Diagnosis present

## 2013-12-11 LAB — CBC WITH DIFFERENTIAL/PLATELET
BASO%: 0.8 % (ref 0.0–2.0)
Basophils Absolute: 0.1 10*3/uL (ref 0.0–0.1)
EOS%: 4.6 % (ref 0.0–7.0)
Eosinophils Absolute: 0.3 10*3/uL (ref 0.0–0.5)
HCT: 38.8 % (ref 34.8–46.6)
HGB: 12.8 g/dL (ref 11.6–15.9)
LYMPH%: 33.2 % (ref 14.0–49.7)
MCH: 28.9 pg (ref 25.1–34.0)
MCHC: 33 g/dL (ref 31.5–36.0)
MCV: 87.7 fL (ref 79.5–101.0)
MONO#: 0.6 10*3/uL (ref 0.1–0.9)
MONO%: 9 % (ref 0.0–14.0)
NEUT#: 3.5 10*3/uL (ref 1.5–6.5)
NEUT%: 52.4 % (ref 38.4–76.8)
Platelets: 161 10*3/uL (ref 145–400)
RBC: 4.43 10*6/uL (ref 3.70–5.45)
RDW: 13.7 % (ref 11.2–14.5)
WBC: 6.8 10*3/uL (ref 3.9–10.3)
lymph#: 2.2 10*3/uL (ref 0.9–3.3)

## 2013-12-11 LAB — COMPREHENSIVE METABOLIC PANEL (CC13)
ALT: 13 U/L (ref 0–55)
AST: 18 U/L (ref 5–34)
Albumin: 4.1 g/dL (ref 3.5–5.0)
Alkaline Phosphatase: 43 U/L (ref 40–150)
Anion Gap: 8 mEq/L (ref 3–11)
BUN: 19.5 mg/dL (ref 7.0–26.0)
CO2: 28 mEq/L (ref 22–29)
Calcium: 10.1 mg/dL (ref 8.4–10.4)
Chloride: 108 mEq/L (ref 98–109)
Creatinine: 0.9 mg/dL (ref 0.6–1.1)
Glucose: 94 mg/dl (ref 70–140)
Potassium: 4.4 mEq/L (ref 3.5–5.1)
Sodium: 143 mEq/L (ref 136–145)
Total Bilirubin: 0.41 mg/dL (ref 0.20–1.20)
Total Protein: 7.3 g/dL (ref 6.4–8.3)

## 2013-12-11 MED ORDER — IOHEXOL 300 MG/ML  SOLN
80.0000 mL | Freq: Once | INTRAMUSCULAR | Status: AC | PRN
  Administered 2013-12-11: 80 mL via INTRAVENOUS

## 2013-12-14 ENCOUNTER — Other Ambulatory Visit: Payer: Self-pay

## 2013-12-14 ENCOUNTER — Emergency Department (HOSPITAL_COMMUNITY)
Admission: EM | Admit: 2013-12-14 | Discharge: 2013-12-14 | Disposition: A | Discharge status: Home / Self Care | Payer: Medicare Other | Attending: Emergency Medicine | Admitting: Emergency Medicine

## 2013-12-14 ENCOUNTER — Emergency Department (HOSPITAL_COMMUNITY): Payer: Medicare Other

## 2013-12-14 ENCOUNTER — Encounter (HOSPITAL_COMMUNITY): Payer: Self-pay | Admitting: Emergency Medicine

## 2013-12-14 DIAGNOSIS — Z853 Personal history of malignant neoplasm of breast: Secondary | ICD-10-CM | POA: Insufficient documentation

## 2013-12-14 DIAGNOSIS — Z8673 Personal history of transient ischemic attack (TIA), and cerebral infarction without residual deficits: Secondary | ICD-10-CM | POA: Diagnosis not present

## 2013-12-14 DIAGNOSIS — W1839XA Other fall on same level, initial encounter: Secondary | ICD-10-CM | POA: Insufficient documentation

## 2013-12-14 DIAGNOSIS — M1990 Unspecified osteoarthritis, unspecified site: Secondary | ICD-10-CM | POA: Diagnosis not present

## 2013-12-14 DIAGNOSIS — K219 Gastro-esophageal reflux disease without esophagitis: Secondary | ICD-10-CM | POA: Insufficient documentation

## 2013-12-14 DIAGNOSIS — Z950 Presence of cardiac pacemaker: Secondary | ICD-10-CM | POA: Insufficient documentation

## 2013-12-14 DIAGNOSIS — G40909 Epilepsy, unspecified, not intractable, without status epilepticus: Secondary | ICD-10-CM | POA: Insufficient documentation

## 2013-12-14 DIAGNOSIS — S8991XA Unspecified injury of right lower leg, initial encounter: Secondary | ICD-10-CM | POA: Diagnosis present

## 2013-12-14 DIAGNOSIS — Z8781 Personal history of (healed) traumatic fracture: Secondary | ICD-10-CM | POA: Insufficient documentation

## 2013-12-14 DIAGNOSIS — Y9389 Activity, other specified: Secondary | ICD-10-CM | POA: Insufficient documentation

## 2013-12-14 DIAGNOSIS — R200 Anesthesia of skin: Secondary | ICD-10-CM

## 2013-12-14 DIAGNOSIS — Y998 Other external cause status: Secondary | ICD-10-CM | POA: Insufficient documentation

## 2013-12-14 DIAGNOSIS — Z7901 Long term (current) use of anticoagulants: Secondary | ICD-10-CM | POA: Insufficient documentation

## 2013-12-14 DIAGNOSIS — Z79899 Other long term (current) drug therapy: Secondary | ICD-10-CM | POA: Insufficient documentation

## 2013-12-14 DIAGNOSIS — E785 Hyperlipidemia, unspecified: Secondary | ICD-10-CM | POA: Diagnosis not present

## 2013-12-14 DIAGNOSIS — Y9289 Other specified places as the place of occurrence of the external cause: Secondary | ICD-10-CM | POA: Insufficient documentation

## 2013-12-14 DIAGNOSIS — G20 Parkinson's disease: Secondary | ICD-10-CM | POA: Insufficient documentation

## 2013-12-14 DIAGNOSIS — Z85528 Personal history of other malignant neoplasm of kidney: Secondary | ICD-10-CM | POA: Diagnosis not present

## 2013-12-14 DIAGNOSIS — I10 Essential (primary) hypertension: Secondary | ICD-10-CM | POA: Insufficient documentation

## 2013-12-14 LAB — BASIC METABOLIC PANEL
Anion gap: 15 (ref 5–15)
BUN: 18 mg/dL (ref 6–23)
CO2: 25 mEq/L (ref 19–32)
Calcium: 10.2 mg/dL (ref 8.4–10.5)
Chloride: 102 mEq/L (ref 96–112)
Creatinine, Ser: 0.75 mg/dL (ref 0.50–1.10)
GFR calc Af Amer: 88 mL/min — ABNORMAL LOW (ref >90)
GFR calc non Af Amer: 76 mL/min — ABNORMAL LOW (ref >90)
Glucose, Bld: 92 mg/dL (ref 70–99)
Potassium: 4.2 mEq/L (ref 3.7–5.3)
Sodium: 142 mEq/L (ref 137–147)

## 2013-12-14 LAB — URINALYSIS, ROUTINE W REFLEX MICROSCOPIC
Bilirubin Urine: NEGATIVE
Glucose, UA: NEGATIVE mg/dL
Hgb urine dipstick: NEGATIVE
Ketones, ur: NEGATIVE mg/dL
Leukocytes, UA: NEGATIVE
Nitrite: NEGATIVE
Protein, ur: NEGATIVE mg/dL
Specific Gravity, Urine: 1.004 — ABNORMAL LOW (ref 1.005–1.030)
Urobilinogen, UA: 0.2 mg/dL (ref 0.0–1.0)
pH: 6.5 (ref 5.0–8.0)

## 2013-12-14 LAB — CBC WITH DIFFERENTIAL/PLATELET
Basophils Absolute: 0 10*3/uL (ref 0.0–0.1)
Basophils Relative: 0 % (ref 0–1)
Eosinophils Absolute: 0.2 10*3/uL (ref 0.0–0.7)
Eosinophils Relative: 3 % (ref 0–5)
HCT: 39.2 % (ref 36.0–46.0)
Hemoglobin: 13.2 g/dL (ref 12.0–15.0)
Lymphocytes Relative: 33 % (ref 12–46)
Lymphs Abs: 2.4 10*3/uL (ref 0.7–4.0)
MCH: 29.9 pg (ref 26.0–34.0)
MCHC: 33.7 g/dL (ref 30.0–36.0)
MCV: 88.7 fL (ref 78.0–100.0)
Monocytes Absolute: 0.7 10*3/uL (ref 0.1–1.0)
Monocytes Relative: 10 % (ref 3–12)
Neutro Abs: 3.9 10*3/uL (ref 1.7–7.7)
Neutrophils Relative %: 54 % (ref 43–77)
Platelets: 148 10*3/uL — ABNORMAL LOW (ref 150–400)
RBC: 4.42 MIL/uL (ref 3.87–5.11)
RDW: 13.4 % (ref 11.5–15.5)
WBC: 7.2 10*3/uL (ref 4.0–10.5)

## 2013-12-14 MED ORDER — IOHEXOL 300 MG/ML  SOLN
100.0000 mL | Freq: Once | INTRAMUSCULAR | Status: AC | PRN
  Administered 2013-12-14: 100 mL via INTRAVENOUS

## 2013-12-14 NOTE — ED Notes (Signed)
MD at bedside. Dr. Lacinda Axon at bedside performing stroke exam/assessment.

## 2013-12-14 NOTE — ED Notes (Signed)
Pt's daughter states that her mother was found on the ground on the floor at her house. Pt c/o numbness on right leg.  This fall took place around 130pm today.  Pt has had strokes in the past.

## 2013-12-14 NOTE — Discharge Instructions (Signed)
CT scan of head showed no acute findings. Follow-up with your primary care doctor. This may have been a TIA.

## 2013-12-14 NOTE — ED Provider Notes (Signed)
CSN: [id number]     Arrival date & time 12/14/13  1501 History   First MD Initiated Contact with Patient 12/14/13 1508     Chief Complaint  Patient presents with  . Numbness     (Consider location/radiation/quality/duration/timing/severity/associated sxs/prior Treatment) HPI... level V caveat for urgent need for intervention.   Right lower extremity weakness approximately 2 hours ago. Patient states her right leg gave way and she fell to the floor. No bony tenderness.  She has a history of CVA 2.  Her mentation is intact.no chest pain, dyspnea, stiff neck. She lives at home and has help from her daughter.  Past Medical History  Diagnosis Date  . Complete heart block   . Glaucoma   . Seasonal allergic rhinitis     Pos Skin Test 07-05-07  . GERD (gastroesophageal reflux disease)   . Atrial fibrillation     s/p AV nodal ablation/ PPM  . Hypertension   . Hyperlipidemia   . Arthritis KNEES &BACK  . Neuromuscular disorder SLIGHT TREMOR.  . Bleeding from breast     right  . Pericarditis     now "fluid around heart"  . Stroke 2010, 2014    x2, "big one in 2010"  . Seizures 2013    "think had one"  . Parkinsonism   . Headache(784.0)     sinus headaches  . Compression fracture a long time ago    "back fractured when heart stopped"  . Balance problem   . Orthostatic hypotension   . Light headedness   . CVA 05/07/2008    Qualifier: History of  By: Annamaria Boots MD, Kasandra Knudsen   . Cerebrovascular disease   . Cancer 12/2010    breast with recurrence s/p recent XRT  . Pure hypercholesterolemia   . Essential and other specified forms of tremor   . Esophageal reflux   . Thoracic aneurysm without mention of rupture   . 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 Fibrocystic breast   . Chronic cough   . H/O prior ablation treatment     Failed SVT ablation in 1980s  . Essential tremor   . Overactive bladder   . SOB (shortness of breath)   . Long term (current) use of  anticoagulants    Past Surgical History  Procedure Laterality Date  . Cholecystectomy    . Vesicovaginal fistula closure w/ tah    . Ablation  1980s    Failed SVT ablation  . Flexible bronchoscopy w/ upper endoscopy      and swallowing evaluation  . Pacemaker insertion    . Knee arthroscopy Right     Arthroscopic for torn meniscus  . Cataract extraction Bilateral   . Abdominal hysterectomy  1975    pt. unsure if laparascopic/vaginal/abdominal  . Breast surgery Right 01/15/11    mastectomy  . Bi-ventricular pacemaker upgrade  01/2007    3rd device, upgraded to SJM Frontier II BiV pacemaker by Dr Leonia Reeves  . Mass excision Right 01/19/2013    Procedure: EXCISION CHEST WALL MASS;  Surgeon: [name], MD;  Location: [facility];  Service: General;  Laterality: Right;  . Eye surgery      For glaucoma  . Appendectomy     Family History  Problem Relation Age of Onset  . Asthma Brother   . Asthma Daughter   . Heart disease    . Cancer    . Heart failure Mother   . Cancer Father  brain  . Lymphoma Brother   . Diabetes Brother   . Hypertension Brother   . Heart attack Brother    History  Substance Use Topics  . Smoking status: Never Smoker   . Smokeless tobacco: Never Used  . Alcohol Use: No   OB History    No data available     Review of Systems  Unable to perform ROS: Acuity of condition      Allergies  Review of patient's allergies indicates no known allergies.  Home Medications   Prior to Admission medications   Medication Sig Start Date End Date Taking? Authorizing Provider  acetaminophen (TYLENOL) 500 MG tablet Take 1,000 mg by mouth every 6 (six) hours as needed for moderate pain (pain). For pain.   Yes Historical Provider, MD  anastrozole (ARIMIDEX) 1 MG tablet Take 1 tablet (1 mg total) by mouth daily. 02/26/13  Yes Chauncey Cruel, MD  benzonatate (TESSALON) 100 MG capsule Take 100 mg by mouth 3 (three) times daily as needed for cough (cough).     Yes Historical Provider, MD  Calcium Carbonate-Vitamin D (CALCIUM-VITAMIN D) 500-200 MG-UNIT per tablet Take 1 tablet by mouth daily.    Yes Historical Provider, MD  cephALEXin (KEFLEX) 250 MG capsule Take 1 capsule by mouth at bedtime.  05/25/11  Yes Historical Provider, MD  cetirizine (ZYRTEC) 10 MG tablet Take 10 mg by mouth at bedtime.   Yes Historical Provider, MD  colestipol (COLESTID) 1 G tablet Take 1 g by mouth at bedtime.   Yes Historical Provider, MD  furosemide (LASIX) 40 MG tablet Take 1 tablet (40 mg total) by mouth every morning. 09/06/13  Yes Jettie Booze, MD  levETIRAcetam (KEPPRA) 500 MG tablet Take 250 mg by mouth every 12 (twelve) hours.   Yes Historical Provider, MD  Multiple Vitamin (MULTIVITAMIN WITH MINERALS) TABS tablet Take 1 tablet by mouth daily.   Yes Historical Provider, MD  pantoprazole (PROTONIX) 40 MG tablet Take 1 tablet by mouth daily. 10/02/10  Yes Historical Provider, MD  potassium chloride SA (K-DUR,KLOR-CON) 20 MEQ tablet Take 1 tablet by mouth  daily   Yes Jettie Booze, MD  pravastatin (PRAVACHOL) 80 MG tablet Take 1 tablet by mouth  daily 09/06/13  Yes Jettie Booze, MD  Probiotic Product (ALIGN) 4 MG CAPS Take 4 mg by mouth daily. 01/20/13  Yes [name], MD  propranolol (INDERAL) 60 MG tablet Take 0.5 tablets (30 mg total) by mouth 2 (two) times daily. 09/06/13  Yes Jettie Booze, MD  RESTASIS 0.05 % ophthalmic emulsion Place 1 drop into both eyes every 12 (twelve) hours.  10/15/10  Yes Historical Provider, MD  warfarin (COUMADIN) 2.5 MG tablet Take 1.25-2.5 mg by mouth daily at 6 PM. Take 1/2 Tablet Daily on (Tues, Thurs, Fri, Sat, Sun) & Take 1 Tablet Daily on (Mon & Wed). 07/10/13  Yes Jettie Booze, MD  benzonatate (TESSALON) 100 MG capsule TAKE 1 CAPSULE (100 MG TOTAL) BY MOUTH 3 (THREE)TIMES DAILY AS NEEDEDFORCOUGH. 10/29/13   Deneise Lever, MD   BP 149/69 mmHg  Pulse 68  Temp(Src) 97.5 F (36.4 C) (Oral)  Resp 18   SpO2 95% Physical Exam  Constitutional: She is oriented to person, place, and time. She appears well-developed and well-nourished.  HENT:  Head: Normocephalic and atraumatic.  Eyes: Conjunctivae and EOM are normal. Pupils are equal, round, and reactive to light.  Neck: Normal range of motion. Neck supple.  Cardiovascular: Normal rate, regular rhythm  and normal heart sounds.   Pulmonary/Chest: Effort normal and breath sounds normal.  Abdominal: Soft. Bowel sounds are normal.  Musculoskeletal:  Patient can move right leg but not as vigorously as her left leg  Neurological: She is alert and oriented to person, place, and time.  Skin: Skin is warm and dry.  Psychiatric: She has a normal mood and affect. Her behavior is normal.  Nursing note and vitals reviewed.   ED Course  Procedures (including critical care time) Labs Review Labs Reviewed  BASIC METABOLIC PANEL - Abnormal; Notable for the following:    GFR calc non Af Amer 76 (*)    GFR calc Af Amer 88 (*)    All other components within normal limits  CBC WITH DIFFERENTIAL - Abnormal; Notable for the following:    Platelets 148 (*)    All other components within normal limits  URINALYSIS, ROUTINE W REFLEX MICROSCOPIC - Abnormal; Notable for the following:    Specific Gravity, Urine 1.004 (*)    All other components within normal limits    Imaging Review Ct Head W Wo Contrast  12/14/2013   CLINICAL DATA:  Found down earlier today. RIGHT-sided weakness. History of prior strokes.  EXAM: CT HEAD WITHOUT AND WITH CONTRAST  TECHNIQUE: Contiguous axial images were obtained from the base of the skull through the vertex without and with intravenous contrast  CONTRAST:  163mL OMNIPAQUE IOHEXOL 300 MG/ML  SOLN  COMPARISON:  CT head 08/23/2012.  FINDINGS: Generalized atrophy. Chronic microvascular ischemic change, greater on the LEFT, was probably present in 2014 but better appreciated on today's scan. No features to strongly suggest  subcortical infarction of an acute nature.  No visible intracranial hemorrhage, mass lesion, or subdural collection. Hydrocephalus ex vacuo.  No CT signs of proximal vascular thrombosis. MCA M2 calcification in the RIGHT sylvian fissure is stable.Moderate vascular calcification in the carotid siphon and BILATERAL vertebral arteries.  Generalized cortical atrophy appears unchanged from priors. Prominence of the sylvian fissures. No visible skull fracture. No significant scalp hematoma.  Incompletely visualized posterior fossa due to BILATERAL hearing aids. No sinus or mastoid disease.  IMPRESSION: No definite acute intracranial findings. Generalized atrophy and chronic microvascular ischemic change.  Asymmetry of subcortical white matter hypoattenuation on the LEFT similar to 2014; see discussion above.   Electronically Signed   By: Rolla Flatten M.D.   On: 12/14/2013 17:14     EKG Interpretation None      MDM   Final diagnoses:  Right leg numbness    Right leg weakness appeared to improve over time.  CT head shows no acute findings.  Discussed findings with patient and daughter.  Will d/c home and f/u with primary care    Nat Christen, MD 12/14/13 2031

## 2013-12-17 ENCOUNTER — Ambulatory Visit: Payer: Medicare Other | Admitting: Oncology

## 2013-12-17 ENCOUNTER — Telehealth: Payer: Self-pay | Admitting: Oncology

## 2013-12-17 ENCOUNTER — Ambulatory Visit (HOSPITAL_BASED_OUTPATIENT_CLINIC_OR_DEPARTMENT_OTHER): Payer: Medicare Other | Admitting: Oncology

## 2013-12-17 VITALS — BP 117/71 | HR 68 | Temp 97.5°F | Resp 18 | Ht 65.0 in | Wt 149.4 lb

## 2013-12-17 DIAGNOSIS — C50211 Malignant neoplasm of upper-inner quadrant of right female breast: Secondary | ICD-10-CM

## 2013-12-17 DIAGNOSIS — I6789 Other cerebrovascular disease: Secondary | ICD-10-CM

## 2013-12-17 DIAGNOSIS — R2689 Other abnormalities of gait and mobility: Secondary | ICD-10-CM

## 2013-12-17 DIAGNOSIS — M899 Disorder of bone, unspecified: Secondary | ICD-10-CM

## 2013-12-17 DIAGNOSIS — Z853 Personal history of malignant neoplasm of breast: Secondary | ICD-10-CM

## 2013-12-17 DIAGNOSIS — C7989 Secondary malignant neoplasm of other specified sites: Secondary | ICD-10-CM

## 2013-12-17 DIAGNOSIS — M79604 Pain in right leg: Secondary | ICD-10-CM

## 2013-12-17 DIAGNOSIS — I480 Paroxysmal atrial fibrillation: Secondary | ICD-10-CM

## 2013-12-17 DIAGNOSIS — Z950 Presence of cardiac pacemaker: Secondary | ICD-10-CM

## 2013-12-17 DIAGNOSIS — C50911 Malignant neoplasm of unspecified site of right female breast: Secondary | ICD-10-CM

## 2013-12-17 NOTE — Telephone Encounter (Signed)
gv pt/dtr appt schedule for may 2016

## 2013-12-17 NOTE — Progress Notes (Signed)
ID: Debra Lynn   DOB: 08-10-1928  MR#: [id number]  VOZ#:[id number]  PCP: Debra Evener, MD GYN: SURolm Lynn OTHER QQ:VZDGLOV [name]  CHIEF COMPLAINT: Locally recurrent breast cancer  CURRENT TREATMENT: Anastrozole  BREAST CANCER HISTORY: From the original intake note:  The patient had routine screening mammography 12/15/2010 at [place]. She has heterogeneously dense breast tissue. There was a suggestion of a new mass in the upper inner quadrant of the right breast and she was brought back November 16 for right diagnostic mammography and ultrasonography. Spot compression views confirmed a 1.8 cm multilobulated mass which by ultrasound measured 2.1 cm, was ill-defined and hypoechoic. Biopsies November 19 ([id number]) showed an invasive ductal carcinoma, grade 2, strongly e-cadherin positive, estrogen receptor 90% positive, progesterone receptor negative, with no HER to amplification by CISH. The MIB-1 was 25%.  The patient is not a candidate for MRI because she has a pacemaker in place. Accordingly she underwent BSGI, showing a solitary focus of increased uptake, measuring 2.3 cm. With this information she was seen at the [facility] and a recommendation tomproceed with surgery was made. She had a Right simple mastectomy 01/15/2011.   Her subsequent history is as detailed below.   INTERVAL HISTORY: Debra Lynn returns today for follow-up of her breast cancer, accompanied by her daughter Debra Lynn. From a breast cancer point of view, Debra Lynn is doing fine. She just had a CT of the chest which shows no evidence of active disease. She is tolerating anastrozole with no side effects that she is aware of.  REVIEW OF SYSTEMS: However this past Friday, 12/14/2013, she felt her right leg go numb and give way. She fell and was not able to rise. She had pain in the right foot and toes. She was taken to the emergency room at [facility] where a brain CT with contrast was obtained, showing no obvious  infarct or mass. The patient is already anticoagulated because of her history of A. Fib. There was also no hemorrhage noted. Over the past 3 days, she has walked very little, mostly with a shuffling gait. She is very unsteady on her feet, but denies dizziness, headaches, visual changes, nausea, or vomiting. Her right foot and toes do hurt, the toes more than the foot. She short of breath when climbing stairs, has urinary stress incontinence, and is generally deconditioned, but none of these things aren't. A detailed review of systems today was otherwise stable.   PAST MEDICAL HISTORY: Past Medical History  Diagnosis Date  . Complete heart block   . Glaucoma   . Seasonal allergic rhinitis     Pos Skin Test 07-05-07  . GERD (gastroesophageal reflux disease)   . Atrial fibrillation     s/p AV nodal ablation/ PPM  . Hypertension   . Hyperlipidemia   . Arthritis KNEES &BACK  . Neuromuscular disorder SLIGHT TREMOR.  . Bleeding from breast     right  . Pericarditis     now "fluid around heart"  . Stroke 2010, 2014    x2, "big one in 2010"  . Seizures 2013    "think had one"  . Parkinsonism   . Headache(784.0)     sinus headaches  . Compression fracture a long time ago    "back fractured when heart stopped"  . Balance problem   . Orthostatic hypotension   . Light headedness   . CVA 05/07/2008    Qualifier: History of  By: Debra Boots MD, Debra Lynn   . Cerebrovascular disease   .  Cancer 12/2010    breast with recurrence s/p recent XRT  . Pure hypercholesterolemia   . Essential and other specified forms of tremor   . Esophageal reflux   . Thoracic aneurysm without mention of rupture   . 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 Fibrocystic breast   . Chronic cough   . H/O prior ablation treatment     Failed SVT ablation in 1980s  . Essential tremor   . Overactive bladder   . SOB (shortness of breath)   . Long term (current) use of anticoagulants     PAST SURGICAL  HISTORY: Past Surgical History  Procedure Laterality Date  . Cholecystectomy    . Vesicovaginal fistula closure w/ tah    . Ablation  1980s    Failed SVT ablation  . Flexible bronchoscopy w/ upper endoscopy      and swallowing evaluation  . Pacemaker insertion    . Knee arthroscopy Right     Arthroscopic for torn meniscus  . Cataract extraction Bilateral   . Abdominal hysterectomy  1975    pt. unsure if laparascopic/vaginal/abdominal  . Breast surgery Right 01/15/11    mastectomy  . Bi-ventricular pacemaker upgrade  01/2007    3rd device, upgraded to SJM Frontier II BiV pacemaker by Dr Debra Lynn  . Mass excision Right 01/19/2013    Procedure: EXCISION CHEST WALL MASS;  Surgeon: Debra Bookbinder, MD;  Location: [facility];  Service: General;  Laterality: Right;  . Eye surgery      For glaucoma  . Appendectomy      FAMILY HISTORY Family History  Problem Relation Age of Onset  . Asthma Brother   . Asthma Daughter   . Heart disease    . Cancer    . Heart failure Mother   . Cancer Father     brain  . Lymphoma Brother   . Diabetes Brother   . Hypertension Brother   . Heart attack Brother   The patient's father died at the age of 5 from central nervous system cancer. The patient's mother died at the age of 74. The patient had one brother who had a history of lymphoma, but survives.  GYNECOLOGIC HISTORY: GX P2. 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 Her husband Debra Lynn used to work for [employer]. Daughter Debra Lynn lives in [place] and is a retired Merchant navy officer. Debra Lynn is a widow of my former patient "Debra Lynn" Debra Lynn.) Daughter Debra Lynn lives in [place] where she works as a Orthoptist. The patient has 2 grandsons. She is not a Ambulance person.    ADVANCED DIRECTIVES: in place  HEALTH  MAINTENANCE: History  Substance Use Topics  . Smoking status: Never Smoker   . Smokeless tobacco: Never Used  . Alcohol Use: No     Colonoscopy:  PAP:  Bone density: SOLIS Jan 2013, T - 1.7 R fem neck  Lipid panel:  No Known Allergies  Current Outpatient Prescriptions  Medication Sig Dispense Refill  . acetaminophen (TYLENOL) 500 MG tablet Take 1,000 mg by mouth every 6 (six) hours as needed for moderate pain (pain). For pain.    Marland Kitchen anastrozole (ARIMIDEX) 1 MG tablet Take 1 tablet (1 mg total) by mouth daily. 90 tablet 12  . benzonatate (TESSALON) 100 MG capsule TAKE 1 CAPSULE (100  MG TOTAL) BY MOUTH 3 (THREE)TIMES DAILY AS NEEDEDFORCOUGH. 200 capsule 3  . benzonatate (TESSALON) 100 MG capsule Take 100 mg by mouth 3 (three) times daily as needed for cough (cough).     . Calcium Carbonate-Vitamin D (CALCIUM-VITAMIN D) 500-200 MG-UNIT per tablet Take 1 tablet by mouth daily.     . cephALEXin (KEFLEX) 250 MG capsule Take 1 capsule by mouth at bedtime.     . cetirizine (ZYRTEC) 10 MG tablet Take 10 mg by mouth at bedtime.    . colestipol (COLESTID) 1 G tablet Take 1 g by mouth at bedtime.    . furosemide (LASIX) 40 MG tablet Take 1 tablet (40 mg total) by mouth every morning. 90 tablet 3  . levETIRAcetam (KEPPRA) 500 MG tablet Take 250 mg by mouth every 12 (twelve) hours.    . Multiple Vitamin (MULTIVITAMIN WITH MINERALS) TABS tablet Take 1 tablet by mouth daily.    . pantoprazole (PROTONIX) 40 MG tablet Take 1 tablet by mouth daily.    . potassium chloride SA (K-DUR,KLOR-CON) 20 MEQ tablet Take 1 tablet by mouth  daily 90 tablet 1  . pravastatin (PRAVACHOL) 80 MG tablet Take 1 tablet by mouth  daily 90 tablet 3  . Probiotic Product (ALIGN) 4 MG CAPS Take 4 mg by mouth daily. 30 capsule 0  . propranolol (INDERAL) 60 MG tablet Take 0.5 tablets (30 mg total) by mouth 2 (two) times daily. 90 tablet 3  . RESTASIS 0.05 % ophthalmic emulsion Place 1 drop into both eyes every 12 (twelve) hours.      Marland Kitchen warfarin (COUMADIN) 2.5 MG tablet Take 1.25-2.5 mg by mouth daily at 6 PM. Take 1/2 Tablet Daily on (Tues, Thurs, Fri, Sat, Sun) & Take 1 Tablet Daily on (Mon & Wed).     No current facility-administered medications for this visit.    OBJECTIVE: Elderly white woman who appears stated age 4 Vitals:   12/17/13 1446  BP: 117/71  Pulse: 68  Temp: 97.5 F (36.4 C)  Resp: 18     Body mass index is 24.86 kg/(m^2).    ECOG FS: 3  Sclerae unicteric, EOMs intact, pupils round and equal Oropharynx clear, teeth in good repair No cervical or supraclavicular adenopathy Lungs no rales or rhonchi Heart regular rate and rhythm Abd soft, nontender, positive bowel sounds, no masses palpated MSK no focal spinal tenderness, no upper extremity edema Neuro: grip is 5 over 5 bilaterally. His flexion and dorsiflexion on the left are both 5 over 5. On the right hip flexion is 5 over 5, dorsiflexion is 5 minus over 5. She walks with a shuffling gait, which is not new. She can't stand on her toes on the left, but not quite on the right.Exam is otherwise nonfocal. Mood is positive. She is well-oriented.  LAB RESULTS: Lab Results  Component Value Date   WBC 7.2 12/14/2013   NEUTROABS 3.9 12/14/2013   HGB 13.2 12/14/2013   HCT 39.2 12/14/2013   MCV 88.7 12/14/2013   PLT 148* 12/14/2013      Chemistry      Component Value Date/Time   NA 142 12/14/2013 1612   NA 143 12/11/2013 1122   K 4.2 12/14/2013 1612   K 4.4 12/11/2013 1122   CL 102 12/14/2013 1612   CL 104 03/23/2012 1329   CO2 25 12/14/2013 1612   CO2 28 12/11/2013 1122   BUN 18 12/14/2013 1612   BUN 19.5 12/11/2013 1122   CREATININE 0.75 12/14/2013 1612  CREATININE 0.9 12/11/2013 1122      Component Value Date/Time   CALCIUM 10.2 12/14/2013 1612   CALCIUM 10.1 12/11/2013 1122   ALKPHOS 43 12/11/2013 1122   ALKPHOS 34* 06/21/2013 1001   AST 18 12/11/2013 1122   AST 18 06/21/2013 1001   ALT 13 12/11/2013 1122   ALT 17  06/21/2013 1001   BILITOT 0.41 12/11/2013 1122   BILITOT 0.5 06/21/2013 1001       Lab Results  Component Value Date   LABCA2 15 12/30/2010    No components found for: [id number]  No results for input(s): INR in the last 168 hours.  Urinalysis    Component Value Date/Time   COLORURINE YELLOW 12/14/2013 [place] 12/14/2013 1608   LABSPEC 1.004* 12/14/2013 1608   PHURINE 6.5 12/14/2013 1608   GLUCOSEU NEGATIVE 12/14/2013 1608   HGBUR NEGATIVE 12/14/2013 1608   BILIRUBINUR NEGATIVE 12/14/2013 1608   KETONESUR NEGATIVE 12/14/2013 1608   PROTEINUR NEGATIVE 12/14/2013 1608   UROBILINOGEN 0.2 12/14/2013 1608   NITRITE NEGATIVE 12/14/2013 1608   LEUKOCYTESUR NEGATIVE 12/14/2013 1608    STUDIES: Ct Head W Wo Contrast  12/14/2013   CLINICAL DATA:  Found down earlier today. RIGHT-sided weakness. History of prior strokes.  EXAM: CT HEAD WITHOUT AND WITH CONTRAST  TECHNIQUE: Contiguous axial images were obtained from the base of the skull through the vertex without and with intravenous contrast  CONTRAST:  145m OMNIPAQUE IOHEXOL 300 MG/ML  SOLN  COMPARISON:  CT head 08/23/2012.  FINDINGS: Generalized atrophy. Chronic microvascular ischemic change, greater on the LEFT, was probably present in 2014 but better appreciated on today's scan. No features to strongly suggest subcortical infarction of an acute nature.  No visible intracranial hemorrhage, mass lesion, or subdural collection. Hydrocephalus ex vacuo.  No CT signs of proximal vascular thrombosis. MCA M2 calcification in the RIGHT sylvian fissure is stable.Moderate vascular calcification in the carotid siphon and BILATERAL vertebral arteries.  Generalized cortical atrophy appears unchanged from priors. Prominence of the sylvian fissures. No visible skull fracture. No significant scalp hematoma.  Incompletely visualized posterior fossa due to BILATERAL hearing aids. No sinus or mastoid disease.  IMPRESSION: No definite acute  intracranial findings. Generalized atrophy and chronic microvascular ischemic change.  Asymmetry of subcortical white matter hypoattenuation on the LEFT similar to 2014; see discussion above.   Electronically Signed   By: JRolla FlattenM.D.   On: 12/14/2013 17:14   Ct Chest W Contrast  12/11/2013   CLINICAL DATA:  History of right mastectomy in 2012. Right mass excision in 2014. Followup of recurrent breast cancer. Right chest pain.Cancer of upper-inner quadrant of female breast C50.219 (ICD-10-CM) Chest wall recurrence of breast cancer C50.919 (ICD-10-CM)  EXAM: CT CHEST WITH CONTRAST  TECHNIQUE: Multidetector CT imaging of the chest was performed during intravenous contrast administration.  CONTRAST:  89mOMNIPAQUE IOHEXOL 300 MG/ML  SOLN  COMPARISON:  02/21/2013 and 06/18/2010  FINDINGS: Lungs/Pleura:  Biapical pleural parenchymal scarring.  Radiation fibrosis in the anterior right upper lobe is minimal. Left lower lobe pulmonary nodule which measures 5 mm on image 44, unchanged. The 7 mm subpleural nodule described on the prior exam is not readily apparent. Scattered pulmonary nodules on the order of 2-3 mm are similar in size and distribution. Example images 16, 25, 30, 32. No dominant enlarging lesion identified.  No pleural fluid.  Heart/Mediastinum: No supraclavicular adenopathy. Pacer. Tortuous right ventricular lead, similar. Right mastectomy. No axillary adenopathy.  Prominent ascending aorta. 3.9 cm  on coronal image 27. Unchanged. Abberant right subclavian artery, arising from the distal transverse aorta. This traverses posterior to the esophagus. Mild cardiomegaly, without pericardial effusion. No central pulmonary embolism, on this non-dedicated study. A No mediastinal or hilar adenopathy. Small hiatal hernia.  Upper Abdomen: Moderate hepatic steatosis. Two left hepatic lobe lesions which are unchanged. The larger measures 9 mm and is favored to represent a cyst or minimally complex cyst. This was  present back to 10/21/10. Cholecystectomy.  Normal imaged portions of the pancreas, adrenal glands, kidneys  Bones/Musculoskeletal: Mild superior endplate irregularity at T12 is favored to represent Schmorl's node and is unchanged.  IMPRESSION: 1. Tiny pulmonary nodules which are similar to the prior exam of 02/21/2013. These are nonspecific. They could be post infectious/inflammatory. Metastatic disease is felt unlikely, given stability. 2. No other evidence of metastatic disease within the chest. 3. Borderline aneurysmal dilatation of the ascending aorta, similar 4. Moderate hepatic steatosis. 5. Abberant right subclavian artery.   Electronically Signed   By: Abigail Miyamoto M.D.   On: 12/11/2013 16:01     ASSESSMENT: 80 y.o. [place] woman status post simple mastectomy 01/15/2011 for a T2 NX (stage II) invasive ductal carcinoma, grade 3, 100% estrogen receptor positive, with a borderline MIB-1, progesterone receptor and HER-2 negative.  (1) not a candidate for tamoxifen due to cardiac issues and decided against aromatase inhibitors because of osteopenia present at baseline and concerns re. bisphosphonates  (2) local recurrence to the right chest wall documented by biopsy 01-2013 showing an invasive ductal carcinoma, grade 1 or 2, estrogen receptor strongly positive, progesterone receptor and HER-2 negative, with an MIB-1 of 28%.  (3) excision of the right chest wall mass 01/19/2013 with negative but close margins (the deep margin is skeletal muscle).  (4) status post radiation therapy to the right chest wall completed 04/23/2013  (5) Ct chest January 2015 shows small bilateral lung lesions many of which are longstanding; repeat chest CT DEC 2015 planned  (6) anastrozole started January 2015; osteopenia per bone density January 2013 with T -1.5 as lowest score   PLAN: I am not sure exactly what when on or is going on with Lalaine as far as her right leg weakness and pain is concerned. My exam is  not revealing and she has good hip flexion and 5 minus dorsiflexion on the right. She does have some pain on the right leg and toes but then she fell on that side recently. The head CT suggests possible chronic ischemic changes left greater than right, but we can't do an MRI because she has a pacemaker in place. Furthermore she is already anticoagulated.  In short as far as the possible right TIA is concerned she will need neurologic evaluation. They already have an appointment with Dr. Doy Mince at [facility] 2 weeks from now, but I suggested they call and try to get that moved up.  From a breast cancer point of view she is doing very well. There is no evidence of active disease at present. She is tolerating anastrozole with no side effects that she is aware of and of course this medication as compared to placebo does not increase the risk of clots.  Accordingly from my point of view she will see me again in 6 months. The plan is to continue anastrozole indefinitely. They will let me know if I can be of further help regarding the current problem. Otherwise Daryana knows to call for any problems that may develop before her next visit  here. Chauncey Cruel, MD   12/17/2013

## 2013-12-19 ENCOUNTER — Ambulatory Visit (INDEPENDENT_AMBULATORY_CARE_PROVIDER_SITE_OTHER): Payer: Medicare Other

## 2013-12-19 DIAGNOSIS — J309 Allergic rhinitis, unspecified: Secondary | ICD-10-CM

## 2013-12-20 ENCOUNTER — Ambulatory Visit: Payer: Medicare Other

## 2013-12-21 ENCOUNTER — Ambulatory Visit (INDEPENDENT_AMBULATORY_CARE_PROVIDER_SITE_OTHER): Payer: Medicare Other

## 2013-12-21 DIAGNOSIS — I6789 Other cerebrovascular disease: Secondary | ICD-10-CM

## 2013-12-21 DIAGNOSIS — I4891 Unspecified atrial fibrillation: Secondary | ICD-10-CM

## 2013-12-21 LAB — POCT INR: INR: 1.8

## 2013-12-26 ENCOUNTER — Telehealth: Payer: Self-pay | Admitting: Interventional Cardiology

## 2013-12-26 NOTE — Telephone Encounter (Signed)
Returned call to [place] pt's daughter, advised no interaction with Aricept and Coumadin.  No need to adjust Coumadin dosage or check INR sooner.  Advised to have pt keep scheduled f/u appt in clinic.

## 2013-12-26 NOTE — Telephone Encounter (Signed)
New message     Patient has been prescribed aricept 5mg  and she is on coumadin.  Is this ok?

## 2014-01-03 ENCOUNTER — Other Ambulatory Visit: Payer: Self-pay | Admitting: Family Medicine

## 2014-01-03 ENCOUNTER — Ambulatory Visit (INDEPENDENT_AMBULATORY_CARE_PROVIDER_SITE_OTHER): Payer: Medicare Other

## 2014-01-03 DIAGNOSIS — G459 Transient cerebral ischemic attack, unspecified: Secondary | ICD-10-CM

## 2014-01-03 DIAGNOSIS — J309 Allergic rhinitis, unspecified: Secondary | ICD-10-CM

## 2014-01-10 ENCOUNTER — Ambulatory Visit
Admission: RE | Admit: 2014-01-10 | Discharge: 2014-01-10 | Disposition: A | Discharge status: Home / Self Care | Payer: Medicare Other | Source: Ambulatory Visit | Attending: Family Medicine | Admitting: Family Medicine

## 2014-01-10 DIAGNOSIS — G459 Transient cerebral ischemic attack, unspecified: Secondary | ICD-10-CM

## 2014-01-14 ENCOUNTER — Ambulatory Visit: Payer: Medicare Other

## 2014-01-17 ENCOUNTER — Ambulatory Visit (INDEPENDENT_AMBULATORY_CARE_PROVIDER_SITE_OTHER): Payer: Medicare Other

## 2014-01-17 DIAGNOSIS — J309 Allergic rhinitis, unspecified: Secondary | ICD-10-CM

## 2014-01-18 ENCOUNTER — Ambulatory Visit (INDEPENDENT_AMBULATORY_CARE_PROVIDER_SITE_OTHER): Payer: Medicare Other | Admitting: *Deleted

## 2014-01-18 DIAGNOSIS — I4891 Unspecified atrial fibrillation: Secondary | ICD-10-CM

## 2014-01-18 DIAGNOSIS — I6789 Other cerebrovascular disease: Secondary | ICD-10-CM

## 2014-01-18 LAB — POCT INR: INR: 2.6

## 2014-01-31 ENCOUNTER — Ambulatory Visit (INDEPENDENT_AMBULATORY_CARE_PROVIDER_SITE_OTHER): Payer: Medicare Other

## 2014-01-31 DIAGNOSIS — J309 Allergic rhinitis, unspecified: Secondary | ICD-10-CM

## 2014-02-14 ENCOUNTER — Ambulatory Visit (INDEPENDENT_AMBULATORY_CARE_PROVIDER_SITE_OTHER): Payer: 59

## 2014-02-14 DIAGNOSIS — J309 Allergic rhinitis, unspecified: Secondary | ICD-10-CM

## 2014-02-15 ENCOUNTER — Ambulatory Visit (INDEPENDENT_AMBULATORY_CARE_PROVIDER_SITE_OTHER): Payer: 59 | Admitting: *Deleted

## 2014-02-15 DIAGNOSIS — I4891 Unspecified atrial fibrillation: Secondary | ICD-10-CM

## 2014-02-15 DIAGNOSIS — I6789 Other cerebrovascular disease: Secondary | ICD-10-CM

## 2014-02-15 LAB — POCT INR: INR: 2.7

## 2014-02-19 ENCOUNTER — Telehealth: Payer: Self-pay | Admitting: Internal Medicine

## 2014-02-19 ENCOUNTER — Telehealth: Payer: Self-pay | Admitting: Pharmacist Clinician (PhC)/ Clinical Pharmacy Specialist

## 2014-02-19 MED ORDER — WARFARIN SODIUM 2.5 MG PO TABS: ORAL_TABLET | ORAL | Status: DC

## 2014-02-19 NOTE — Telephone Encounter (Signed)
New Message        Pt's daughter calling stating that she needs to ask Erasmo Downer a question. Please call back and advise.

## 2014-02-19 NOTE — Telephone Encounter (Signed)
Pt daughter called for refill on warfarin

## 2014-02-20 ENCOUNTER — Other Ambulatory Visit: Payer: Self-pay | Admitting: Interventional Cardiology

## 2014-02-21 ENCOUNTER — Ambulatory Visit (INDEPENDENT_AMBULATORY_CARE_PROVIDER_SITE_OTHER): Payer: Medicare Other

## 2014-02-21 ENCOUNTER — Ambulatory Visit (INDEPENDENT_AMBULATORY_CARE_PROVIDER_SITE_OTHER): Payer: Medicare Other | Admitting: *Deleted

## 2014-02-21 DIAGNOSIS — I442 Atrioventricular block, complete: Secondary | ICD-10-CM

## 2014-02-21 DIAGNOSIS — J309 Allergic rhinitis, unspecified: Secondary | ICD-10-CM

## 2014-02-21 LAB — MDC_IDC_ENUM_SESS_TYPE_INCLINIC
Implantable Pulse Generator Model: 5586
Implantable Pulse Generator Serial Number: 1908386
Lead Channel Setting Pacing Amplitude: 2.5 V
Lead Channel Setting Pacing Amplitude: 2.5 V
Lead Channel Setting Pacing Amplitude: 3.25 V
Lead Channel Setting Pacing Pulse Width: 0.7 ms
Lead Channel Setting Pacing Pulse Width: 0.8 ms
Lead Channel Setting Sensing Sensitivity: 4 mV
Lead Channel Setting Sensing Sensitivity: 4 mV

## 2014-02-21 NOTE — Telephone Encounter (Signed)
Pt seen in office for device check to check episodes recorded.

## 2014-02-21 NOTE — Progress Notes (Signed)
CRT-P interrogation only. Pt having dizziness not related to position changes. Dizziness is all time. Pt was checked at PCP and PCP recommended pacemaker check. Battery longevity 0.50 year. 20 mode switch episodes--all less than 1 minute. Pt enrolled in [employer] and will be changed to monthly TTMs instead of q 3 months. Pt is scheduled with JA on 04-24-14 @ 1130.

## 2014-02-28 ENCOUNTER — Ambulatory Visit (INDEPENDENT_AMBULATORY_CARE_PROVIDER_SITE_OTHER): Payer: Medicare Other

## 2014-02-28 DIAGNOSIS — J309 Allergic rhinitis, unspecified: Secondary | ICD-10-CM

## 2014-03-04 ENCOUNTER — Encounter: Payer: Self-pay | Admitting: Internal Medicine

## 2014-03-07 ENCOUNTER — Ambulatory Visit (INDEPENDENT_AMBULATORY_CARE_PROVIDER_SITE_OTHER): Payer: Medicare Other

## 2014-03-07 DIAGNOSIS — J309 Allergic rhinitis, unspecified: Secondary | ICD-10-CM

## 2014-03-11 ENCOUNTER — Telehealth: Payer: Self-pay | Admitting: Interventional Cardiology

## 2014-03-11 NOTE — Telephone Encounter (Signed)
Error

## 2014-03-14 ENCOUNTER — Ambulatory Visit (INDEPENDENT_AMBULATORY_CARE_PROVIDER_SITE_OTHER): Payer: Medicare Other

## 2014-03-14 DIAGNOSIS — J309 Allergic rhinitis, unspecified: Secondary | ICD-10-CM

## 2014-03-18 ENCOUNTER — Other Ambulatory Visit (INDEPENDENT_AMBULATORY_CARE_PROVIDER_SITE_OTHER): Payer: Medicare Other | Admitting: *Deleted

## 2014-03-18 ENCOUNTER — Ambulatory Visit (INDEPENDENT_AMBULATORY_CARE_PROVIDER_SITE_OTHER): Payer: Medicare Other | Admitting: Interventional Cardiology

## 2014-03-18 ENCOUNTER — Other Ambulatory Visit: Payer: Self-pay

## 2014-03-18 ENCOUNTER — Encounter: Payer: Self-pay | Admitting: Interventional Cardiology

## 2014-03-18 VITALS — BP 122/82 | HR 69 | Ht 65.0 in | Wt 151.0 lb

## 2014-03-18 DIAGNOSIS — I4891 Unspecified atrial fibrillation: Secondary | ICD-10-CM

## 2014-03-18 DIAGNOSIS — Z5181 Encounter for therapeutic drug level monitoring: Secondary | ICD-10-CM

## 2014-03-18 DIAGNOSIS — I10 Essential (primary) hypertension: Secondary | ICD-10-CM

## 2014-03-18 DIAGNOSIS — R42 Dizziness and giddiness: Secondary | ICD-10-CM

## 2014-03-18 DIAGNOSIS — E785 Hyperlipidemia, unspecified: Secondary | ICD-10-CM

## 2014-03-18 DIAGNOSIS — Z950 Presence of cardiac pacemaker: Secondary | ICD-10-CM

## 2014-03-18 DIAGNOSIS — I480 Paroxysmal atrial fibrillation: Secondary | ICD-10-CM

## 2014-03-18 LAB — BASIC METABOLIC PANEL
BUN: 19 mg/dL (ref 6–23)
CO2: 31 mEq/L (ref 19–32)
Calcium: 10 mg/dL (ref 8.4–10.5)
Chloride: 101 mEq/L (ref 96–112)
Creatinine, Ser: 1.15 mg/dL (ref 0.40–1.20)
GFR: 47.86 mL/min — ABNORMAL LOW (ref >60.00)
Glucose, Bld: 91 mg/dL (ref 70–99)
Potassium: 3.7 mEq/L (ref 3.5–5.1)
Sodium: 140 mEq/L (ref 135–145)

## 2014-03-18 LAB — BRAIN NATRIURETIC PEPTIDE: Pro B Natriuretic peptide (BNP): 71 pg/mL (ref 0.0–100.0)

## 2014-03-18 LAB — PROTIME-INR
INR: 1.9 ratio — ABNORMAL HIGH (ref 0.8–1.0)
Prothrombin Time: 21 s — ABNORMAL HIGH (ref 9.6–13.1)

## 2014-03-18 NOTE — Progress Notes (Signed)
Patient ID: Debra Lynn, female   DOB: 1928/05/04, 81 y.o.   MRN: [id number]    [address], [address] [place], [place]  [address] Phone: [phone] Fax:  [phone]  Date:  03/18/2014   ID:  Debra Lynn, DOB Mar 23, 1928, MRN [id number]  PCP:  Debra Evener, MD      History of Present Illness: Debra Lynn is a 81 y.o. female with pacemaker and atrial fibrillation and prior CVA.  She reports unsteadiness and lightheadedness as well.   She saw her neurologist who felt that her neuropathy was worse.  She used to have this issue with standing.  Now it is more frequent and not always dependent on change in positions.  Lying down gives relief.  SHe is walking with a walker.    Pacer check shows very brief episodes of AFib.    She still reports fatigue. She has issues with balance. She has fallen in the past but nothing recently.   Atrial Fibrillation F/U:  Denies : Chest pain.  Leg edema.  Orthopnea.  Palpitations.  Syncope.   Still SHOB with short walks even with walker.  Wt Readings from Last 3 Encounters:  03/18/14 151 lb (68.493 kg)  12/17/13 149 lb 6.4 oz (67.767 kg)  11/22/13 149 lb 6.4 oz (67.767 kg)     Past Medical History  Diagnosis Date  . Complete heart block   . Glaucoma   . Seasonal allergic rhinitis     Pos Skin Test 07-05-07  . GERD (gastroesophageal reflux disease)   . Atrial fibrillation     s/p AV nodal ablation/ PPM  . Hypertension   . Hyperlipidemia   . Arthritis KNEES &BACK  . Neuromuscular disorder SLIGHT TREMOR.  . Bleeding from breast     right  . Pericarditis     now "fluid around heart"  . Stroke 2010, 2014    x2, "big one in 2010"  . Seizures 2013    "think had one"  . Parkinsonism   . Headache(784.0)     sinus headaches  . Compression fracture a long time ago    "back fractured when heart stopped"  . Balance problem   . Orthostatic hypotension   . Light headedness   . CVA 05/07/2008    Qualifier: History of  By: Debra Boots  MD, Debra Lynn   . Cerebrovascular disease   . Cancer 12/2010    breast with recurrence s/p recent XRT  . Pure hypercholesterolemia   . Essential and other specified forms of tremor   . Esophageal reflux   . Thoracic aneurysm without mention of rupture   . 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 Fibrocystic breast   . Chronic cough   . H/O prior ablation treatment     Failed SVT ablation in 1980s  . Essential tremor   . Overactive bladder   . SOB (shortness of breath)   . Long term (current) use of anticoagulants     Current Outpatient Prescriptions  Medication Sig Dispense Refill  . acetaminophen (TYLENOL) 500 MG tablet Take 1,000 mg by mouth every 6 (six) hours as needed for moderate pain (pain). For pain.    Marland Kitchen anastrozole (ARIMIDEX) 1 MG tablet Take 1 tablet (1 mg total) by mouth daily. 90 tablet 12  . benzonatate (TESSALON) 100 MG capsule TAKE 1 CAPSULE (100 MG TOTAL) BY MOUTH 3 (THREE)TIMES DAILY AS NEEDEDFORCOUGH. 200 capsule 3  . Calcium Carbonate-Vitamin D (CALCIUM-VITAMIN  D) 500-200 MG-UNIT per tablet Take 1 tablet by mouth daily.     . carbidopa-levodopa (SINEMET IR) 25-100 MG per tablet Take 1 tablet by mouth 3 (three) times daily.    . cephALEXin (KEFLEX) 250 MG capsule Take 1 capsule by mouth at bedtime.     . cetirizine (ZYRTEC) 10 MG tablet Take 10 mg by mouth at bedtime.    . colestipol (COLESTID) 1 G tablet Take 1 g by mouth at bedtime.    . furosemide (LASIX) 40 MG tablet Take 1 tablet (40 mg total) by mouth every morning. 90 tablet 3  . levETIRAcetam (KEPPRA) 500 MG tablet Take 250 mg by mouth every 12 (twelve) hours.    . Multiple Vitamin (MULTIVITAMIN WITH MINERALS) TABS tablet Take 1 tablet by mouth daily.    . pantoprazole (PROTONIX) 40 MG tablet Take 1 tablet by mouth daily.    . potassium chloride SA (K-DUR,KLOR-CON) 20 MEQ tablet Take 1 tablet by mouth  daily 90 tablet 1  . pravastatin (PRAVACHOL) 80 MG tablet Take 1 tablet by mouth   daily 90 tablet 3  . Probiotic Product (ALIGN) 4 MG CAPS Take 4 mg by mouth daily. 30 capsule 0  . propranolol (INDERAL) 60 MG tablet Take 0.5 tablets (30 mg total) by mouth 2 (two) times daily. 90 tablet 3  . warfarin (COUMADIN) 2.5 MG tablet Take 1 tablet by mouth daily or as directed by coumadin clinic 90 tablet 1   No current facility-administered medications for this visit.    Allergies:   No Known Allergies  Social History:  The patient  reports that she has never smoked. She has never used smokeless tobacco. She reports that she does not drink alcohol or use illicit drugs.   Family History:  The patient's family history includes Asthma in her brother and daughter; Cancer in her father and another family member; Diabetes in her brother; Heart attack in her brother; Heart disease in an other family member; Heart failure in her mother; Hypertension in her brother; Lymphoma in her brother.   ROS:  Please see the history of present illness.  No nausea, vomiting.  No fevers, chills.  No focal weakness.  No dysuria.    All other systems reviewed and negative.   PHYSICAL EXAM: VS:  BP 122/82 mmHg  Pulse 69  Ht 5\' 5"  (1.651 m)  Wt 151 lb (68.493 kg)  BMI 25.13 kg/m2 Well nourished, well developed, in no acute distress HEENT: normal Neck: no JVD, no carotid bruits Cardiac:  normal S1, S2; RRR;  Lungs:  clear to auscultation bilaterally, no wheezing, rhonchi or rales Abd: soft, nontender, no hepatomegaly Ext: no edema Skin: warm and dry Neuro:   no focal abnormalities noted Psych: flat affect  Normal ECG   ASSESSMENT AND PLAN:  SOB  Continue Lasix Tablet, 40 MG, 1 tablet, Orally, Once a day Continue Klor-Con M20 Tablet Extended Release, 20 MEQ, 1 tablet, Orally, once a day Notes: BNP was increased  In the past. we switched diuretic to Lasix. SHOB has improved slightly.  Check BNP. If elevated, would Consider increasing furosemide to 80 mg daily. She feels shortness of breath is  somewhat worse now. I think this is due to deconditioning. She was very inactive.   She is trying to increase activity in the house..    2. Atrial fibrillation  Continue Propranolol HCl Tablet, 60 MG, 1 tablet, Orally, twice a day Notes: Regular rhythm now. Continue Coumadin for now. If falling becomes  more frequent, would have to reconsider anticoagulation for stroke prevention. She has had a prior stroke and would be a high risk for recurrent stroke.  No palpitations or symptoms of fast heart rates.  Pacer present.  Continue regular checks.  Due for changeout soon.    3. Dizziness and giddiness  Notes: Dizziness persisting.   I do not think this is related to her heart. She has no signs of congestive heart failure..  She has avoided falling.  Blood pressures are stable, ranging in the 867-544 systolic. This may also be in part related to her parkinsonism. She will followup with neurology as well. OK to decrease losartan to 25 mg every other day. If BP stable , would d/c losartan.   We have a long discussion regarding her dizziness. This is likely multifactorial. She has neurologic issues including neuropathy, and small vessel vascular disease in her brain. I am not certain that there'll be any definitive treatment that we'll be available to resolve her dizziness.   4. Hyperlipidemia: TG increased  In the past. COntinue pravastatin. Could add fish oil, 2gr BID. Freeze capsules.  Will need to recheck lipids.   Preventive Medicine   Adult topics discussed:  Diet: healthy diet.  Exercise: 5 days a week, at least 30 minutes of aerobic exercise.     Preventive Medicine  Adult topics discussed:  Diet: healthy diet.  Exercise: 5 days a week, at least 30 minutes of aerobic exercise.      Signed, Mina Marble, MD, [facility] 03/18/2014 3:21 PM

## 2014-03-18 NOTE — Addendum Note (Signed)
Addended by: Eulis Foster on: 03/18/2014 03:46 PM   Modules accepted: Orders

## 2014-03-18 NOTE — Patient Instructions (Signed)
Your physician recommends that you continue on your current medications as directed. Please refer to the Current Medication list given to you today.  Your physician wants you to follow-up in: 1 year with Dr. Varanasi. You will receive a reminder letter in the mail two months in advance. If you don't receive a letter, please call our office to schedule the follow-up appointment.  

## 2014-03-19 ENCOUNTER — Ambulatory Visit (INDEPENDENT_AMBULATORY_CARE_PROVIDER_SITE_OTHER): Payer: Medicare Other | Admitting: Pharmacist

## 2014-03-19 DIAGNOSIS — I6789 Other cerebrovascular disease: Secondary | ICD-10-CM

## 2014-03-19 DIAGNOSIS — R42 Dizziness and giddiness: Secondary | ICD-10-CM | POA: Insufficient documentation

## 2014-03-19 DIAGNOSIS — I480 Paroxysmal atrial fibrillation: Secondary | ICD-10-CM

## 2014-03-21 ENCOUNTER — Ambulatory Visit (INDEPENDENT_AMBULATORY_CARE_PROVIDER_SITE_OTHER): Payer: Medicare Other

## 2014-03-21 DIAGNOSIS — J309 Allergic rhinitis, unspecified: Secondary | ICD-10-CM

## 2014-03-23 ENCOUNTER — Other Ambulatory Visit: Payer: Self-pay | Admitting: Internal Medicine

## 2014-03-24 ENCOUNTER — Encounter: Payer: Self-pay | Admitting: Internal Medicine

## 2014-03-24 DIAGNOSIS — I442 Atrioventricular block, complete: Secondary | ICD-10-CM

## 2014-03-25 ENCOUNTER — Telehealth: Payer: Self-pay | Admitting: Cardiology

## 2014-03-25 NOTE — Telephone Encounter (Signed)
Butch Penny w/ heartcare called and stated that pt is nearing ERI.

## 2014-03-28 ENCOUNTER — Ambulatory Visit (INDEPENDENT_AMBULATORY_CARE_PROVIDER_SITE_OTHER): Payer: Medicare Other

## 2014-03-28 ENCOUNTER — Telehealth: Payer: Self-pay | Admitting: Internal Medicine

## 2014-03-28 DIAGNOSIS — J309 Allergic rhinitis, unspecified: Secondary | ICD-10-CM

## 2014-03-28 NOTE — Telephone Encounter (Signed)
Advised pt's daughter [name] that this medication was sent in 03/26/14. Nothing further was needed.

## 2014-04-02 ENCOUNTER — Encounter: Payer: Self-pay | Admitting: Neurology

## 2014-04-04 ENCOUNTER — Ambulatory Visit (INDEPENDENT_AMBULATORY_CARE_PROVIDER_SITE_OTHER): Payer: Medicare Other

## 2014-04-04 DIAGNOSIS — J309 Allergic rhinitis, unspecified: Secondary | ICD-10-CM

## 2014-04-05 ENCOUNTER — Ambulatory Visit: Payer: Medicare Other

## 2014-04-09 ENCOUNTER — Ambulatory Visit (INDEPENDENT_AMBULATORY_CARE_PROVIDER_SITE_OTHER): Payer: Medicare Other | Admitting: Neurology

## 2014-04-09 ENCOUNTER — Encounter: Payer: Self-pay | Admitting: Neurology

## 2014-04-09 VITALS — Ht 63.0 in | Wt 153.0 lb

## 2014-04-09 DIAGNOSIS — G609 Hereditary and idiopathic neuropathy, unspecified: Secondary | ICD-10-CM

## 2014-04-09 DIAGNOSIS — G214 Vascular parkinsonism: Secondary | ICD-10-CM

## 2014-04-09 DIAGNOSIS — I6782 Cerebral ischemia: Secondary | ICD-10-CM

## 2014-04-09 NOTE — Progress Notes (Signed)
Debra Lynn was seen today in the movement disorders clinic for neurologic consultation for vascular parkinsonism.  The patient has been previously seen at [facility] for the same by Dr. Doy Mince, but those notes are not available in Care Everywhere.  However, the patient did bring fairly detailed notes with her.  The patient had a history of left MCA stroke in 2009 and received IV TPA.  Following that infarct, she had mild aphasia and slight ataxia, but otherwise strength was normal.  She did have a single seizure in August, 2013 and was placed on Keppra.  There have been no further seizures.  She is on 250 mg bid.  No one witnessed the event and she has difficulty describing the event except to state that she was just shaking one of the limbs and was "losing control."   In July, 2014 the patient was again admitted to [facility] for left-sided weakness and presumable stroke.  Her INR was therapeutic at the time.  Most recently, in November, 2015 she had right-sided weakness and numbness and again went to the emergency room.  It was felt that she likely had an ACA territory infarct that rapidly improved.  It was felt cardioembolic.  Parkinsonism has been diagnosed in her records since 01/19/2012.  There has been a question of whether or not this is vascular in nature or idiopathic Parkinson's disease.  They think that it occurred long before that, but this is when the doctor first mentioned it.  According to the records that she brought me, they have wanted to avoid aggressive dopaminergic therapy because of "age and baseline cognitive dysfunction."  She was however, started on carbidopa/levodopa 25/100 in February and she has been on that for about 6 weeks.  She takes that at 10am/1pm/6pm.  That corresponds with mealtime.  She has not noted a big change with the medication.  She has been through LSVT and PWR therapy, the last time was sometime in 08/2012.      Specific Symptoms:  Tremor: Yes.  , head tremor  and "sometimes" in the R hand Voice: no change in strength Sleep: sleeps well  Vivid Dreams:  Yes.    Acting out dreams:  No. Wet Pillows: No. Postural symptoms:  Yes.  , used since 12/2013 stroke but thinks that neuropathy is causing it  Falls?  Yes.   (rare - 2 in the last few years) Bradykinesia symptoms: shuffling gait, slow movements and difficulty getting out of a chair Loss of smell:  No. Loss of taste:  No. Urinary Incontinence:  Yes.  , stress incontinence - wears poise Difficulty Swallowing:  Yes.   (but just rarely with pills and not with foods) Handwriting, micrographia: Yes.   Trouble with ADL's:  Yes.   (able to do all of them but is slower than in the past)  Trouble buttoning clothing: Yes.   Depression:  No. Memory changes:  Yes.   (able to remember pills now but does out of pill box; quit driving after first stroke 2009; pays monthly bills with new reminder system) Hallucinations:  No.  visual distortions: Yes.   (rarely) N/V:  Yes.   Lightheaded:  Yes.    Syncope: No. Diplopia:  No. Dyskinesia:  No.  Neuroimaging has previously been performed.  It is available for my review today.  She had a CT brain with and without on 12/14/13 for stroke like sx's (the reason that she is seeing Dr. Doy Mince at [facility], who is a stroke  neurologist).  This demonstrated significant small vessel changes and atrophy.  She cannot have an MRI of the brain due to PPM.  PREVIOUS MEDICATIONS: Sinemet  ALLERGIES:  No Known Allergies  CURRENT MEDICATIONS:  Outpatient Encounter Prescriptions as of 04/09/2014  Medication Sig  . acetaminophen (TYLENOL) 500 MG tablet Take 1,000 mg by mouth every 6 (six) hours as needed for moderate pain (pain). For pain.  Marland Kitchen anastrozole (ARIMIDEX) 1 MG tablet Take 1 tablet (1 mg total) by mouth daily.  . benzonatate (TESSALON) 100 MG capsule TAKE 1 CAPSULE BY MOUTH 3TIMES A DAY AS NEEDED FORCOUGH  . Calcium Carbonate-Vitamin D (CALCIUM-VITAMIN D) 500-200  MG-UNIT per tablet Take 1 tablet by mouth daily.   . carbidopa-levodopa (SINEMET IR) 25-100 MG per tablet Take 1 tablet by mouth 3 (three) times daily.  . cephALEXin (KEFLEX) 250 MG capsule Take 1 capsule by mouth at bedtime.   . cetirizine (ZYRTEC) 10 MG tablet Take 10 mg by mouth at bedtime.  . colestipol (COLESTID) 1 G tablet Take 1 g by mouth at bedtime.  . furosemide (LASIX) 40 MG tablet Take 1 tablet (40 mg total) by mouth every morning.  . levETIRAcetam (KEPPRA) 500 MG tablet Take 250 mg by mouth every 12 (twelve) hours.  . Multiple Vitamin (MULTIVITAMIN WITH MINERALS) TABS tablet Take 1 tablet by mouth daily.  . pantoprazole (PROTONIX) 40 MG tablet Take 1 tablet by mouth daily.  . potassium chloride SA (K-DUR,KLOR-CON) 20 MEQ tablet Take 1 tablet by mouth  daily  . pravastatin (PRAVACHOL) 80 MG tablet Take 1 tablet by mouth  daily  . Probiotic Product (ALIGN) 4 MG CAPS Take 4 mg by mouth daily.  . propranolol (INDERAL) 60 MG tablet Take 0.5 tablets (30 mg total) by mouth 2 (two) times daily.  Marland Kitchen warfarin (COUMADIN) 2.5 MG tablet Take 1 tablet by mouth daily or as directed by coumadin clinic    PAST MEDICAL HISTORY:   Past Medical History  Diagnosis Date  . Complete heart block   . Glaucoma   . Seasonal allergic rhinitis     Pos Skin Test 07-05-07  . GERD (gastroesophageal reflux disease)   . Atrial fibrillation     s/p AV nodal ablation/ PPM  . Hypertension   . Hyperlipidemia   . Arthritis KNEES &BACK  . Neuromuscular disorder SLIGHT TREMOR.  . Bleeding from breast     right  . Pericarditis     now "fluid around heart"  . Stroke 2010, 2014    x2, "big one in 2010"  . Seizures 2013    "think had one"  . Parkinsonism   . Headache(784.0)     sinus headaches  . Compression fracture a long time ago    "back fractured when heart stopped"  . Balance problem   . Orthostatic hypotension   . Light headedness   . CVA 05/07/2008    Qualifier: History of  By: Annamaria Boots MD, Kasandra Knudsen    . Cerebrovascular disease   . Cancer 12/2010    breast with recurrence s/p recent XRT  . Pure hypercholesterolemia   . Essential and other specified forms of tremor   . Esophageal reflux   . Thoracic aneurysm without mention of rupture   . 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 Fibrocystic breast   . Chronic cough   . H/O prior ablation treatment     Failed SVT ablation in 1980s  . Essential tremor   . Overactive  bladder   . SOB (shortness of breath)   . Long term (current) use of anticoagulants     PAST SURGICAL HISTORY:   Past Surgical History  Procedure Laterality Date  . Cholecystectomy    . Vesicovaginal fistula closure w/ tah    . Ablation  1980s    Failed SVT ablation  . Flexible bronchoscopy w/ upper endoscopy      and swallowing evaluation  . Pacemaker insertion    . Knee arthroscopy Right     Arthroscopic for torn meniscus  . Cataract extraction Bilateral   . Abdominal hysterectomy  1975    pt. unsure if laparascopic/vaginal/abdominal  . Breast surgery Right 01/15/11    mastectomy  . Bi-ventricular pacemaker upgrade  01/2007    3rd device, upgraded to SJM Frontier II BiV pacemaker by Dr Leonia Reeves  . Mass excision Right 01/19/2013    Procedure: EXCISION CHEST WALL MASS;  Surgeon: [name], MD;  Location: [facility]S;  Service: General;  Laterality: Right;  . Eye surgery      For glaucoma  . Appendectomy      SOCIAL HISTORY:   History   Social History  . Marital Status: Married    Spouse Name: N/A  . Number of Children: 2  . Years of Education: N/A   Occupational History  . retired Secretary/administrator    Social History Main Topics  . Smoking status: Never Smoker   . Smokeless tobacco: Never Used  . Alcohol Use: No  . Drug Use: No  . Sexual Activity: Not on file   Other Topics Concern  . Not on file   Social History Narrative    FAMILY HISTORY:   Family Status  Relation Status Death Age  . Mother Deceased 70    heart  failure  . Father Deceased 66    brain cancer  . Brother Deceased     heart failure, leukemia  . Daughter Alive     healthy  . Daughter Alive     healthy    ROS:  A complete 10 system review of systems was obtained and was unremarkable apart from what is mentioned above.  PHYSICAL EXAMINATION:    VITALS:   Filed Vitals:   04/09/14 1334  Height: 5\' 3"  (1.6 m)  Weight: 153 lb (69.4 kg)    GEN:  The patient appears stated age and is in NAD. HEENT:  Normocephalic, atraumatic.  The mucous membranes are moist. The superficial temporal arteries are without ropiness or tenderness. CV:  RRR Lungs:  CTAB Neck/HEME:  There are no carotid bruits bilaterally.  Neurological examination:  Orientation: The patient is alert and oriented x3. Fund of knowledge is appropriate.  Recent and remote memory are intact.  Attention and concentration are normal.    Able to name objects and repeat phrases. Cranial nerves: There is good facial symmetry. Pupils are equal round and reactive to light bilaterally. Fundoscopic exam reveals clear margins bilaterally. Extraocular muscles are intact. The visual fields are full to confrontational testing. The speech is fluent and clear. Soft palate rises symmetrically and there is no tongue deviation. Hearing is intact to conversational tone. Sensation: Sensation is intact to light and pinprick throughout (facial, trunk, extremities). Vibration is markedly decreased in the bilateral lower extremities, being markedly decreased at the knee and absent distally thereafter. There is right-sided neglect. There is no sensory dermatomal level identified. Motor: Strength is 5/5 in the bilateral upper and lower extremities.   Shoulder shrug is equal  and symmetric.  There is no pronator drift. Deep tendon reflexes: Deep tendon reflexes are 2/4 at the bilateral biceps, triceps, brachioradialis, 1/4 at the bilateral patella and absent at the bilateral achilles. Plantar responses are  neutral bilaterally.  Movement examination: Tone: There is normal tone in the bilateral upper extremities.  The tone in the lower extremities is normal.  Abnormal movements: She has head tremor in the "no" direction.   Coordination:  There is no significant decremation with RAM's Gait and Station: The patient has minimal difficulty arising out of a deep-seated chair without the use of the hands and is able to do it on the first try. The patient's stride length is just slightly decreased, but it is wide-based.    ASSESSMENT/PLAN:  1.  Vascular parkinsonism.  -I explained to the patient that I feel confident she does not have idiopathic Parkinson's disease.  Explained to her that vascular parkinsonism only responds to levodopa about 30% of the time and in patients who do respond to levodopa, they only respond for a few years.  She does not think that levodopa has been of benefit.  I told her that we can always try a higher dose, but it likely would not be of value.  -Discussed safety in detail 2.  Severe idiopathic PN  -I can see she has recently had a normal fasting glucose from her primary care physician.  Recommend B12, folate, RPR, SPEP/UPEP with immunofixation.  -This certainly can cause significant gait instability.  Talked to her about the importance of using her walker at all times.  Talked to her about safety in the home.  Talked to her about nightlights, getting rid of her aerobics, wearing hard sole shoes. 3.  History of multiple cerebral infarctions and a history of a single seizure, likely the result of a prior stroke.  -She has had great neurologic care at [facility] with Dr. Doy Mince.  It is my recommendation that she continue care with Dr. Doy Mince.  He has been working with her neuropathy as well.  She really does not need two neurologists and since she does not have Parkinson's disease, I really recommend that she just continue with Dr. Doy Mince and will discharge her to his full  care. 4.  Greater than 50% of 60 min visit in counseling with the patient/daughter as above.

## 2014-04-09 NOTE — Patient Instructions (Signed)
1. Your provider has requested that you have labwork completed today. Please go to [facility] on the first floor of this building before leaving the office today.

## 2014-04-09 NOTE — Progress Notes (Signed)
Note faxed to Dr Radene Ou at [phone] with confirmation received.

## 2014-04-10 LAB — RPR

## 2014-04-10 LAB — FOLATE: Folate: >20 ng/mL

## 2014-04-10 LAB — VITAMIN B12: Vitamin B-12: 548 pg/mL (ref 211–911)

## 2014-04-11 ENCOUNTER — Ambulatory Visit (INDEPENDENT_AMBULATORY_CARE_PROVIDER_SITE_OTHER): Payer: Medicare Other

## 2014-04-11 DIAGNOSIS — J309 Allergic rhinitis, unspecified: Secondary | ICD-10-CM

## 2014-04-11 LAB — UIFE/LIGHT CHAINS/TP QN, 24-HR UR
Albumin, U: DETECTED
Alpha 1, Urine: DETECTED — AB
Alpha 2, Urine: DETECTED — AB
Beta, Urine: DETECTED — AB
Gamma Globulin, Urine: DETECTED — AB
Total Protein, Urine: 31 mg/dL — ABNORMAL HIGH (ref 5–24)

## 2014-04-11 LAB — SPEP & IFE WITH QIG
Albumin ELP: 52.3 % — ABNORMAL LOW (ref 55.8–66.1)
Alpha-1-Globulin: 4.4 % (ref 2.9–4.9)
Alpha-2-Globulin: 13.5 % — ABNORMAL HIGH (ref 7.1–11.8)
Beta 2: 7.2 % — ABNORMAL HIGH (ref 3.2–6.5)
Beta Globulin: 6.7 % (ref 4.7–7.2)
Gamma Globulin: 15.9 % (ref 11.1–18.8)
IgA: 149 mg/dL (ref 69–380)
IgG (Immunoglobin G), Serum: 987 mg/dL (ref 690–1700)
IgM, Serum: 169 mg/dL (ref 52–322)
Total Protein, Serum Electrophoresis: 7.3 g/dL (ref 6.0–8.3)

## 2014-04-15 ENCOUNTER — Encounter: Payer: Self-pay | Admitting: Internal Medicine

## 2014-04-15 ENCOUNTER — Ambulatory Visit (INDEPENDENT_AMBULATORY_CARE_PROVIDER_SITE_OTHER): Payer: Medicare Other

## 2014-04-15 DIAGNOSIS — J309 Allergic rhinitis, unspecified: Secondary | ICD-10-CM

## 2014-04-17 ENCOUNTER — Ambulatory Visit: Payer: Medicare Other | Admitting: Interventional Cardiology

## 2014-04-17 ENCOUNTER — Encounter: Payer: Self-pay | Admitting: Neurology

## 2014-04-18 ENCOUNTER — Ambulatory Visit (INDEPENDENT_AMBULATORY_CARE_PROVIDER_SITE_OTHER): Payer: Medicare Other

## 2014-04-18 DIAGNOSIS — J309 Allergic rhinitis, unspecified: Secondary | ICD-10-CM

## 2014-04-21 ENCOUNTER — Other Ambulatory Visit: Payer: Self-pay | Admitting: Oncology

## 2014-04-21 ENCOUNTER — Encounter: Payer: Self-pay | Admitting: Internal Medicine

## 2014-04-21 DIAGNOSIS — I442 Atrioventricular block, complete: Secondary | ICD-10-CM | POA: Diagnosis not present

## 2014-04-24 ENCOUNTER — Ambulatory Visit (INDEPENDENT_AMBULATORY_CARE_PROVIDER_SITE_OTHER): Payer: Medicare Other | Admitting: *Deleted

## 2014-04-24 ENCOUNTER — Ambulatory Visit (INDEPENDENT_AMBULATORY_CARE_PROVIDER_SITE_OTHER): Payer: Medicare Other | Admitting: Internal Medicine

## 2014-04-24 ENCOUNTER — Encounter: Payer: Self-pay | Admitting: Internal Medicine

## 2014-04-24 VITALS — BP 130/80 | HR 64 | Ht 63.0 in | Wt 152.0 lb

## 2014-04-24 DIAGNOSIS — I480 Paroxysmal atrial fibrillation: Secondary | ICD-10-CM | POA: Diagnosis not present

## 2014-04-24 DIAGNOSIS — I442 Atrioventricular block, complete: Secondary | ICD-10-CM

## 2014-04-24 DIAGNOSIS — I4891 Unspecified atrial fibrillation: Secondary | ICD-10-CM

## 2014-04-24 DIAGNOSIS — I10 Essential (primary) hypertension: Secondary | ICD-10-CM | POA: Diagnosis not present

## 2014-04-24 DIAGNOSIS — I6789 Other cerebrovascular disease: Secondary | ICD-10-CM | POA: Diagnosis not present

## 2014-04-24 LAB — MDC_IDC_ENUM_SESS_TYPE_INCLINIC
Battery Impedance: 14300 Ohm
Battery Remaining Longevity: 3 mo
Battery Voltage: 2.55 V
Date Time Interrogation Session: 20160323153743
Implantable Pulse Generator Model: 5586
Implantable Pulse Generator Serial Number: 1908386
Lead Channel Impedance Value: 1227 Ohm
Lead Channel Impedance Value: 570 Ohm
Lead Channel Impedance Value: 991 Ohm
Lead Channel Pacing Threshold Amplitude: 0.75 V
Lead Channel Pacing Threshold Amplitude: 1.5 V
Lead Channel Pacing Threshold Amplitude: 2.75 V
Lead Channel Pacing Threshold Pulse Width: 0.5 ms
Lead Channel Pacing Threshold Pulse Width: 0.7 ms
Lead Channel Pacing Threshold Pulse Width: 0.8 ms
Lead Channel Sensing Intrinsic Amplitude: 1.75 mV
Lead Channel Setting Pacing Amplitude: 2.5 V
Lead Channel Setting Pacing Amplitude: 3 V
Lead Channel Setting Pacing Amplitude: 3.25 V
Lead Channel Setting Pacing Pulse Width: 0.8 ms
Lead Channel Setting Sensing Sensitivity: 4 mV
Lead Channel Setting Sensing Sensitivity: 4 mV

## 2014-04-24 LAB — POCT INR: INR: 2.3

## 2014-04-24 NOTE — Patient Instructions (Signed)
Your physician recommends that you schedule a follow-up appointment in: 3 months with the device clinic  

## 2014-04-24 NOTE — Progress Notes (Signed)
Milagros Evener, MD: Primary Cardiologist:  [name] is a 81 y.o. female with a h/o complete heart block sp PPM with upgrade to a BiV pacemaker (SJM) by Dr Leonia Reeves in 2008 who presents today for routine follow up care in the Electrophysiology device clinic.   The patient reports doing very well since having a pacemaker implanted and had c/o chronic dizziness. This has been evaluated by several providers and is thought to be secondary to neuropathy. No falls. She had been diagnosed with recurrence of breast cancer and  completed radiation to the R breast (same side as her pacemaker) as of last February.  She denise any awareness of afib. Battery is nearing ERI and will need monthly phone checks.   Today, she  denies symptoms of palpitations, chest pain, shortness of breath, orthopnea, PND, lower extremity edema,  presyncope, syncope, or neurologic sequela. Positive for chronic dizziness.  The patientis tolerating medications without difficulties and is otherwise without complaint today.   Past Medical History  Diagnosis Date  . Complete heart block   . Glaucoma   . Seasonal allergic rhinitis     Pos Skin Test 07-05-07  . GERD (gastroesophageal reflux disease)   . Atrial fibrillation     s/p AV nodal ablation/ PPM  . Hypertension   . Hyperlipidemia   . Arthritis KNEES &BACK  . Neuromuscular disorder SLIGHT TREMOR.  . Bleeding from breast     right  . Pericarditis     now "fluid around heart"  . Stroke 2010, 2014    x2, "big one in 2010"  . Seizures 2013    "think had one"  . Parkinsonism   . Headache(784.0)     sinus headaches  . Compression fracture a long time ago    "back fractured when heart stopped"  . Balance problem   . Orthostatic hypotension   . Light headedness   . CVA 05/07/2008    Qualifier: History of  By: Annamaria Boots MD, Kasandra Knudsen   . Cerebrovascular disease   . Cancer 12/2010    breast with recurrence s/p recent XRT  . Pure hypercholesterolemia     . Essential and other specified forms of tremor   . Esophageal reflux   . Thoracic aneurysm without mention of rupture   . 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 Fibrocystic breast   . Chronic cough   . H/O prior ablation treatment     Failed SVT ablation in 1980s  . Essential tremor   . Overactive bladder   . SOB (shortness of breath)   . Long term (current) use of anticoagulants    Past Surgical History  Procedure Laterality Date  . Cholecystectomy    . Vesicovaginal fistula closure w/ tah    . Ablation  1980s    Failed SVT ablation  . Flexible bronchoscopy w/ upper endoscopy      and swallowing evaluation  . Pacemaker insertion    . Knee arthroscopy Right     Arthroscopic for torn meniscus  . Cataract extraction Bilateral   . Abdominal hysterectomy  1975    pt. unsure if laparascopic/vaginal/abdominal  . Breast surgery Right 01/15/11    mastectomy  . Bi-ventricular pacemaker upgrade  01/2007    3rd device, upgraded to SJM Frontier II BiV pacemaker by Dr Leonia Reeves  . Mass excision Right 01/19/2013    Procedure: EXCISION CHEST WALL MASS;  Surgeon: [name], MD;  Location: [facility];  Service: General;  Laterality: Right;  . Eye surgery      For glaucoma  . Appendectomy      History   Social History  . Marital Status: Married    Spouse Name: N/A  . Number of Children: 2  . Years of Education: N/A   Occupational History  . retired Secretary/administrator    Social History Main Topics  . Smoking status: Never Smoker   . Smokeless tobacco: Never Used  . Alcohol Use: No  . Drug Use: No  . Sexual Activity: Not on file   Other Topics Concern  . Not on file   Social History Narrative    Family History  Problem Relation Age of Onset  . Asthma Brother   . Asthma Daughter   . Heart disease    . Cancer    . Heart failure Mother   . Cancer Father     brain  . Lymphoma Brother   . Diabetes Brother   . Hypertension Brother   . Heart attack  Brother     No Known Allergies  Current Outpatient Prescriptions  Medication Sig Dispense Refill  . acetaminophen (TYLENOL) 500 MG tablet Take 1,000 mg by mouth every 6 (six) hours as needed for moderate pain (pain).     Marland Kitchen anastrozole (ARIMIDEX) 1 MG tablet Take 1 tablet by mouth  daily 90 tablet 4  . benzonatate (TESSALON) 100 MG capsule TAKE 1 CAPSULE BY MOUTH 3TIMES A DAY AS NEEDED FORCOUGH 90 capsule 3  . Calcium Carbonate-Vitamin D (CALCIUM-VITAMIN D) 500-200 MG-UNIT per tablet Take 1 tablet by mouth daily.     . carbidopa-levodopa (SINEMET IR) 25-100 MG per tablet Take 1 tablet by mouth 3 (three) times daily.    . cephALEXin (KEFLEX) 250 MG capsule Take 1 capsule by mouth at bedtime.     . cetirizine (ZYRTEC) 10 MG tablet Take 10 mg by mouth at bedtime as needed for allergies or rhinitis.     . colestipol (COLESTID) 1 G tablet Take 1 g by mouth at bedtime.    . furosemide (LASIX) 40 MG tablet Take 1 tablet (40 mg total) by mouth every morning. 90 tablet 3  . levETIRAcetam (KEPPRA) 500 MG tablet Take 250 mg by mouth every 12 (twelve) hours.    . Multiple Vitamin (MULTIVITAMIN WITH MINERALS) TABS tablet Take 1 tablet by mouth daily.    . pantoprazole (PROTONIX) 40 MG tablet Take 1 tablet by mouth daily.    . potassium chloride SA (K-DUR,KLOR-CON) 20 MEQ tablet Take 1 tablet by mouth  daily 90 tablet 1  . pravastatin (PRAVACHOL) 80 MG tablet Take 1 tablet by mouth  daily 90 tablet 3  . Probiotic Product (ALIGN) 4 MG CAPS Take 4 mg by mouth daily. 30 capsule 0  . propranolol (INDERAL) 60 MG tablet Take 0.5 tablets (30 mg total) by mouth 2 (two) times daily. 90 tablet 3  . warfarin (COUMADIN) 2.5 MG tablet Take 1 tablet by mouth daily or as directed by coumadin clinic 90 tablet 1   No current facility-administered medications for this visit.    ROS- all systems are reviewed and negative except as per HPI  Physical Exam: Filed Vitals:   04/24/14 1135  BP: 130/80  Pulse: 64  Height:  5\' 3"  (1.6 m)  Weight: 152 lb (68.947 kg)    GEN- The patient is elderly but pleasant appearing, alert and oriented x 3 today.   Head- normocephalic, atraumatic Eyes-  Sclera clear,  conjunctiva pink Ears- hearing intact Oropharynx- clear Neck- supple  Lungs- Clear to ausculation bilaterally, normal work of breathing Chest- R sided pacemaker pocket is well healed Heart- Regular rate and rhythm (paced) GI- soft, NT, ND, + BS Extremities- no clubbing, cyanosis, or edema MS- no significant deformity or atrophy Skin- no rash or lesion Psych- euthymic mood, full affect Neuro- strength and sensation are intact  Pacemaker interrogation- reviewed in detail today,  See PACEART report  Assessment and Plan:  1. Complete heart block Normal BiV pacemaker function See Pace Art report Nearing ERI Monthly phone checks and return to device clinic in 3 months  2. Chronic systolic dysfunction EF has normalized with CRT (by echo 7/14) No changes today  3. HTN Stable No change required today  4. afib Rates are controlled Continue coumadin long term  5. Chronic dizziness Fall precautions   Follow-up with Dr Irish Lack as scheduled

## 2014-04-25 ENCOUNTER — Ambulatory Visit (INDEPENDENT_AMBULATORY_CARE_PROVIDER_SITE_OTHER): Payer: Medicare Other

## 2014-04-25 DIAGNOSIS — J309 Allergic rhinitis, unspecified: Secondary | ICD-10-CM

## 2014-05-02 ENCOUNTER — Ambulatory Visit (INDEPENDENT_AMBULATORY_CARE_PROVIDER_SITE_OTHER): Payer: Medicare Other

## 2014-05-02 DIAGNOSIS — J309 Allergic rhinitis, unspecified: Secondary | ICD-10-CM | POA: Diagnosis not present

## 2014-05-08 ENCOUNTER — Ambulatory Visit (INDEPENDENT_AMBULATORY_CARE_PROVIDER_SITE_OTHER): Payer: Medicare Other

## 2014-05-08 DIAGNOSIS — J309 Allergic rhinitis, unspecified: Secondary | ICD-10-CM | POA: Diagnosis not present

## 2014-05-16 ENCOUNTER — Ambulatory Visit (INDEPENDENT_AMBULATORY_CARE_PROVIDER_SITE_OTHER): Payer: Medicare Other

## 2014-05-16 DIAGNOSIS — J309 Allergic rhinitis, unspecified: Secondary | ICD-10-CM | POA: Diagnosis not present

## 2014-05-19 ENCOUNTER — Encounter: Payer: Self-pay | Admitting: Internal Medicine

## 2014-05-20 ENCOUNTER — Telehealth: Payer: Self-pay | Admitting: Cardiology

## 2014-05-20 NOTE — Telephone Encounter (Signed)
Caryl Never called and stated that pt has reached ERI and will be faxing report.

## 2014-05-23 ENCOUNTER — Ambulatory Visit (INDEPENDENT_AMBULATORY_CARE_PROVIDER_SITE_OTHER): Payer: Medicare Other

## 2014-05-23 DIAGNOSIS — J309 Allergic rhinitis, unspecified: Secondary | ICD-10-CM | POA: Diagnosis not present

## 2014-05-24 ENCOUNTER — Ambulatory Visit (INDEPENDENT_AMBULATORY_CARE_PROVIDER_SITE_OTHER): Payer: Medicare Other | Admitting: *Deleted

## 2014-05-24 DIAGNOSIS — I4891 Unspecified atrial fibrillation: Secondary | ICD-10-CM

## 2014-05-24 DIAGNOSIS — I6789 Other cerebrovascular disease: Secondary | ICD-10-CM | POA: Diagnosis not present

## 2014-05-24 LAB — POCT INR: INR: 1.5

## 2014-05-30 ENCOUNTER — Ambulatory Visit (INDEPENDENT_AMBULATORY_CARE_PROVIDER_SITE_OTHER): Payer: Medicare Other

## 2014-05-30 DIAGNOSIS — J309 Allergic rhinitis, unspecified: Secondary | ICD-10-CM | POA: Diagnosis not present

## 2014-06-04 ENCOUNTER — Telehealth: Payer: Self-pay | Admitting: *Deleted

## 2014-06-04 NOTE — Telephone Encounter (Signed)
Pts daughter called, pt saw PCP office yesterday, DX with Sinus infection. Pt prescribed Levaquin 500mg s QD for 10 days. PCP provider instructed pt to 1/2 her dosage of Coumadin daily until her scheduled follow up appt on Friday.  Pt did reduce yesterdays Coumadin dose, thus told daughter to have pt  resume normal dosage until we check her INR on Friday.

## 2014-06-06 ENCOUNTER — Ambulatory Visit (INDEPENDENT_AMBULATORY_CARE_PROVIDER_SITE_OTHER): Payer: Medicare Other

## 2014-06-06 DIAGNOSIS — J309 Allergic rhinitis, unspecified: Secondary | ICD-10-CM | POA: Diagnosis not present

## 2014-06-07 ENCOUNTER — Ambulatory Visit (INDEPENDENT_AMBULATORY_CARE_PROVIDER_SITE_OTHER): Payer: Medicare Other | Admitting: *Deleted

## 2014-06-07 DIAGNOSIS — I4891 Unspecified atrial fibrillation: Secondary | ICD-10-CM

## 2014-06-07 DIAGNOSIS — I6789 Other cerebrovascular disease: Secondary | ICD-10-CM

## 2014-06-07 LAB — POCT INR: INR: 2.3

## 2014-06-12 ENCOUNTER — Other Ambulatory Visit (HOSPITAL_BASED_OUTPATIENT_CLINIC_OR_DEPARTMENT_OTHER): Payer: Medicare Other

## 2014-06-12 ENCOUNTER — Ambulatory Visit (INDEPENDENT_AMBULATORY_CARE_PROVIDER_SITE_OTHER): Payer: Medicare Other

## 2014-06-12 DIAGNOSIS — C50911 Malignant neoplasm of unspecified site of right female breast: Secondary | ICD-10-CM

## 2014-06-12 DIAGNOSIS — C50211 Malignant neoplasm of upper-inner quadrant of right female breast: Secondary | ICD-10-CM

## 2014-06-12 DIAGNOSIS — C44501 Unspecified malignant neoplasm of skin of breast: Secondary | ICD-10-CM | POA: Diagnosis not present

## 2014-06-12 DIAGNOSIS — C7989 Secondary malignant neoplasm of other specified sites: Secondary | ICD-10-CM

## 2014-06-12 DIAGNOSIS — J309 Allergic rhinitis, unspecified: Secondary | ICD-10-CM

## 2014-06-12 LAB — CBC WITH DIFFERENTIAL/PLATELET
BASO%: 0.8 % (ref 0.0–2.0)
Basophils Absolute: 0.1 10*3/uL (ref 0.0–0.1)
EOS%: 3.8 % (ref 0.0–7.0)
Eosinophils Absolute: 0.3 10*3/uL (ref 0.0–0.5)
HCT: 39.8 % (ref 34.8–46.6)
HGB: 13.3 g/dL (ref 11.6–15.9)
LYMPH%: 29 % (ref 14.0–49.7)
MCH: 29.2 pg (ref 25.1–34.0)
MCHC: 33.5 g/dL (ref 31.5–36.0)
MCV: 87.3 fL (ref 79.5–101.0)
MONO#: 0.6 10*3/uL (ref 0.1–0.9)
MONO%: 8 % (ref 0.0–14.0)
NEUT#: 4.3 10*3/uL (ref 1.5–6.5)
NEUT%: 58.4 % (ref 38.4–76.8)
Platelets: 169 10*3/uL (ref 145–400)
RBC: 4.56 10*6/uL (ref 3.70–5.45)
RDW: 14 % (ref 11.2–14.5)
WBC: 7.4 10*3/uL (ref 3.9–10.3)
lymph#: 2.2 10*3/uL (ref 0.9–3.3)

## 2014-06-12 LAB — COMPREHENSIVE METABOLIC PANEL (CC13)
ALT: <6 U/L (ref 0–55)
AST: 21 U/L (ref 5–34)
Albumin: 4.4 g/dL (ref 3.5–5.0)
Alkaline Phosphatase: 47 U/L (ref 40–150)
Anion Gap: 14 mEq/L — ABNORMAL HIGH (ref 3–11)
BUN: 18.1 mg/dL (ref 7.0–26.0)
CO2: 26 mEq/L (ref 22–29)
Calcium: 9.9 mg/dL (ref 8.4–10.4)
Chloride: 103 mEq/L (ref 98–109)
Creatinine: 0.9 mg/dL (ref 0.6–1.1)
EGFR: 57 mL/min/{1.73_m2} — ABNORMAL LOW (ref >90)
Glucose: 98 mg/dl (ref 70–140)
Potassium: 4 mEq/L (ref 3.5–5.1)
Sodium: 142 mEq/L (ref 136–145)
Total Bilirubin: 0.46 mg/dL (ref 0.20–1.20)
Total Protein: 7.6 g/dL (ref 6.4–8.3)

## 2014-06-13 ENCOUNTER — Ambulatory Visit: Payer: Medicare Other

## 2014-06-14 ENCOUNTER — Ambulatory Visit (INDEPENDENT_AMBULATORY_CARE_PROVIDER_SITE_OTHER): Payer: Medicare Other | Admitting: *Deleted

## 2014-06-14 DIAGNOSIS — I4891 Unspecified atrial fibrillation: Secondary | ICD-10-CM

## 2014-06-14 DIAGNOSIS — I6789 Other cerebrovascular disease: Secondary | ICD-10-CM

## 2014-06-14 LAB — POCT INR: INR: 2.2

## 2014-06-17 ENCOUNTER — Ambulatory Visit (HOSPITAL_BASED_OUTPATIENT_CLINIC_OR_DEPARTMENT_OTHER): Payer: Medicare Other | Admitting: Oncology

## 2014-06-17 VITALS — BP 134/76 | HR 64 | Temp 98.4°F | Resp 18 | Ht 63.0 in | Wt 151.0 lb

## 2014-06-17 DIAGNOSIS — J984 Other disorders of lung: Secondary | ICD-10-CM | POA: Diagnosis not present

## 2014-06-17 DIAGNOSIS — C44501 Unspecified malignant neoplasm of skin of breast: Secondary | ICD-10-CM | POA: Diagnosis not present

## 2014-06-17 DIAGNOSIS — C50211 Malignant neoplasm of upper-inner quadrant of right female breast: Secondary | ICD-10-CM

## 2014-06-17 DIAGNOSIS — M8580 Other specified disorders of bone density and structure, unspecified site: Secondary | ICD-10-CM | POA: Diagnosis not present

## 2014-06-17 DIAGNOSIS — C50911 Malignant neoplasm of unspecified site of right female breast: Secondary | ICD-10-CM

## 2014-06-17 DIAGNOSIS — C7989 Secondary malignant neoplasm of other specified sites: Principal | ICD-10-CM

## 2014-06-17 NOTE — Progress Notes (Signed)
ID: Debra Lynn   DOB: [birth year]  MR#: [id number]  PYK#:[id number]  PCP: Milagros Evener, MD GYN: SURolm Bookbinder OTHER NL:[id number] [name]  CHIEF COMPLAINT: Locally recurrent breast cancer  CURRENT TREATMENT: Anastrozole  BREAST CANCER HISTORY: From the original intake note:  The patient had routine screening mammography 12/14/77 at [facility]. She has heterogeneously dense breast tissue. There was a suggestion of a new mass in the upper inner quadrant of the right breast and she was brought back November 16 for right diagnostic mammography and ultrasonography. Spot compression views confirmed a 1.8 cm multilobulated mass which by ultrasound measured 2.1 cm, was ill-defined and hypoechoic. Biopsies November 19 ([id number]) showed an invasive ductal carcinoma, grade 2, strongly e-cadherin positive, estrogen receptor 90% positive, progesterone receptor negative, with no HER to amplification by CISH. The MIB-1 was 25%.  The patient is not a candidate for MRI because she has a pacemaker in place. Accordingly she underwent BSGI, showing a solitary focus of increased uptake, measuring 2.3 cm. With this information she was seen at the [facility] and a recommendation tomproceed with surgery was made. She had a Right simple mastectomy 01/14/78   Her subsequent history is as detailed below.   INTERVAL HISTORY: Debra Lynn returns today for follow-up of her breast cancer, accompanied by her daughter Debra Lynn. The patient continues on anastrozole, with good tolerance. She is not aware of any problems regarding vaginal dryness or hot flashes. She obtains the drug essentially at no cost  REVIEW OF SYSTEMS: [name] is doing well at home. She does the cooking, a little bit of the cleaning, and the laundry. She had a seizure last in November 2015 ("if that was a seizure". She is frequently dizzy but has not fallen. She is aware of the risk of head injury since she is anticoagulated. She has a bit of a runny nose, and  other sinus problems including a dry cough. She has problems with choking at times and she did vomit one time after trying to take all her medications in one gulp. There has not been any change in bowel or bladder habits. A detail review of systems was otherwise noncontributory   PAST MEDICAL HISTORY: Past Medical History  Diagnosis Date  . Complete heart block   . Glaucoma   . Seasonal allergic rhinitis     Pos Skin Test 07-05-07  . GERD (gastroesophageal reflux disease)   . Atrial fibrillation     s/p AV nodal ablation/ PPM  . Hypertension   . Hyperlipidemia   . Arthritis KNEES &BACK  . Neuromuscular disorder SLIGHT TREMOR.  . Bleeding from breast     right  . Pericarditis     now "fluid around heart"  . Stroke 2010, 2014    x2, "big one in 2010"  . Seizures 2013    "think had one"  . Parkinsonism   . Headache(784.0)     sinus headaches  . Compression fracture a long time ago    "back fractured when heart stopped"  . Balance problem   . Orthostatic hypotension   . Light headedness   . CVA 05/07/2008    Qualifier: History of  By: Annamaria Boots MD, Kasandra Knudsen   . Cerebrovascular disease   . Cancer 12/2010    breast with recurrence s/p recent XRT  . Pure hypercholesterolemia   . Essential and other specified forms of tremor   . Esophageal reflux   . Thoracic aneurysm without mention of rupture   . 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 Fibrocystic breast   . Chronic cough   . H/O prior ablation treatment     Failed SVT ablation in 1980s  . Essential tremor   . Overactive bladder   . SOB (shortness of breath)   . Long term (current) use of anticoagulants     PAST SURGICAL HISTORY: Past Surgical History  Procedure Laterality Date  . Cholecystectomy    . Vesicovaginal fistula closure w/ tah    . Ablation  1980s    Failed SVT ablation  . Flexible bronchoscopy w/ upper endoscopy      and swallowing evaluation  . Pacemaker insertion    . Knee  arthroscopy Right     Arthroscopic for torn meniscus  . Cataract extraction Bilateral   . Abdominal hysterectomy  1975    pt. unsure if laparascopic/vaginal/abdominal  . Breast surgery Right 01/15/11    mastectomy  . Bi-ventricular pacemaker upgrade  01/2007    3rd device, upgraded to SJM Frontier II BiV pacemaker by Dr Leonia Reeves  . Mass excision Right 01/19/2013    Procedure: EXCISION CHEST WALL MASS;  Surgeon: [name], MD;  Location: [facility];  Service: General;  Laterality: Right;  . Eye surgery      For glaucoma  . Appendectomy      FAMILY HISTORY Family History  Problem Relation Age of Onset  . Asthma Brother   . Asthma Daughter   . Heart disease    . Cancer    . Heart failure Mother   . Cancer Father     brain  . Lymphoma Brother   . Diabetes Brother   . Hypertension Brother   . Heart attack Brother   The patient's father died at the age of 68 from central nervous system cancer. The patient's mother died at the age of 87. The patient had one brother who had a history of lymphoma, but survives.  GYNECOLOGIC HISTORY: GX P2. 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 Her husband [name] used to work for [employer]. Daughter Debra Lynn lives in [place] and is a retired Merchant navy officer. Debra Lynn is a widow of my former patient "Debra Lynn" [name] Daughter Debra Lynn lives in [place] where she works as a Orthoptist. The patient has 2 grandsons. She is not a Ambulance person.    ADVANCED DIRECTIVES: in place  HEALTH MAINTENANCE: History  Substance Use Topics  . Smoking status: Never Smoker   . Smokeless tobacco: Never Used  . Alcohol Use: No     Colonoscopy:  PAP:  Bone density: SOLIS Jan 2013, T - 1.7 R fem neck  Lipid panel:  No Known Allergies  Current Outpatient Prescriptions    Medication Sig Dispense Refill  . acetaminophen (TYLENOL) 500 MG tablet Take 1,000 mg by mouth every 6 (six) hours as needed for moderate pain (pain).     Marland Kitchen anastrozole (ARIMIDEX) 1 MG tablet Take 1 tablet by mouth  daily 90 tablet 4  . benzonatate (TESSALON) 100 MG capsule TAKE 1 CAPSULE BY MOUTH 3TIMES A DAY AS NEEDED FORCOUGH 90 capsule 3  . Calcium Carbonate-Vitamin D (CALCIUM-VITAMIN D) 500-200 MG-UNIT per tablet Take 1 tablet by mouth daily.     . carbidopa-levodopa (SINEMET IR) 25-100 MG per tablet Take 1 tablet by mouth 3 (three) times daily.    Marland Kitchen  cephALEXin (KEFLEX) 250 MG capsule Take 1 capsule by mouth at bedtime.     . cetirizine (ZYRTEC) 10 MG tablet Take 10 mg by mouth at bedtime as needed for allergies or rhinitis.     . colestipol (COLESTID) 1 G tablet Take 1 g by mouth at bedtime.    . furosemide (LASIX) 40 MG tablet Take 1 tablet (40 mg total) by mouth every morning. 90 tablet 3  . levETIRAcetam (KEPPRA) 500 MG tablet Take 250 mg by mouth every 12 (twelve) hours.    . Multiple Vitamin (MULTIVITAMIN WITH MINERALS) TABS tablet Take 1 tablet by mouth daily.    . pantoprazole (PROTONIX) 40 MG tablet Take 1 tablet by mouth daily.    . potassium chloride SA (K-DUR,KLOR-CON) 20 MEQ tablet Take 1 tablet by mouth  daily 90 tablet 1  . pravastatin (PRAVACHOL) 80 MG tablet Take 1 tablet by mouth  daily 90 tablet 3  . Probiotic Product (ALIGN) 4 MG CAPS Take 4 mg by mouth daily. 30 capsule 0  . propranolol (INDERAL) 60 MG tablet Take 0.5 tablets (30 mg total) by mouth 2 (two) times daily. 90 tablet 3  . warfarin (COUMADIN) 2.5 MG tablet Take 1 tablet by mouth daily or as directed by coumadin clinic 90 tablet 1   No current facility-administered medications for this visit.    OBJECTIVE: Elderly white woman who uses a walker Filed Vitals:   06/17/14 1615  BP: 134/76  Pulse: 64  Temp: 98.4 F (36.9 C)  Resp: 18     Body mass index is 26.76 kg/(m^2).    ECOG FS: 3  Sclerae  unicteric Oropharynx clear and moist No cervical or supraclavicular adenopathy Lungs no rales or rhonchi Heart regular rate and rhythm Abd soft, nontender, positive bowel sounds MSK no focal spinal tenderness, no upper extremity lymphedema; some tenderness over the right chest wall area, near the surgical scars Neuro: Mild benign tremor, well oriented, appropriate affect; formal neurologic exam not done Breasts: The right breast is status post mastectomy. There is no evidence of local recurrence. The right axilla is benign. The left breast is unremarkable Skin: A copy of the scar in the upper mid chest is below  (Photo taken 06/17/2014)     LAB RESULTS: Lab Results  Component Value Date   WBC 7.4 06/12/2014   NEUTROABS 4.3 06/12/2014   HGB 13.3 06/12/2014   HCT 39.8 06/12/2014   MCV 87.3 06/12/2014   PLT 169 06/12/2014      Chemistry      Component Value Date/Time   NA 142 06/12/2014 1417   NA 140 03/18/2014 1546   K 4.0 06/12/2014 1417   K 3.7 03/18/2014 1546   CL 101 03/18/2014 1546   CL 104 03/23/2012 1329   CO2 26 06/12/2014 1417   CO2 31 03/18/2014 1546   BUN 18.1 06/12/2014 1417   BUN 19 03/18/2014 1546   CREATININE 0.9 06/12/2014 1417   CREATININE 1.15 03/18/2014 1546      Component Value Date/Time   CALCIUM 9.9 06/12/2014 1417   CALCIUM 10.0 03/18/2014 1546   ALKPHOS 47 06/12/2014 1417   ALKPHOS 34* 06/21/2013 1001   AST 21 06/12/2014 1417   AST 18 06/21/2013 1001   ALT <6 06/12/2014 1417   ALT 17 06/21/2013 1001   BILITOT 0.46 06/12/2014 1417   BILITOT 0.5 06/21/2013 1001       Lab Results  Component Value Date   LABCA2 15 12/30/2010    No  components found for: LABCA125   Recent Labs Lab 06/14/14 1427  INR 2.2    Urinalysis    Component Value Date/Time   COLORURINE YELLOW 12/14/2013 [place] 12/14/2013 1608   LABSPEC 1.004* 12/14/2013 1608   PHURINE 6.5 12/14/2013 1608   GLUCOSEU NEGATIVE 12/14/2013 1608   HGBUR  NEGATIVE 12/14/2013 1608   BILIRUBINUR NEGATIVE 12/14/2013 1608   KETONESUR NEGATIVE 12/14/2013 1608   PROTEINUR NEGATIVE 12/14/2013 1608   UROBILINOGEN 0.2 12/14/2013 1608   NITRITE NEGATIVE 12/14/2013 1608   LEUKOCYTESUR NEGATIVE 12/14/2013 1608    STUDIES: No results found.   ASSESSMENT: 81 y.o. [place] woman status post simple mastectomy 01/15/2011 for a T2 NX (stage II) invasive ductal carcinoma, grade 3, 100% estrogen receptor positive, with a borderline MIB-1, progesterone receptor and HER-2 negative.  (1) not a candidate for tamoxifen due to cardiac issues and decided against aromatase inhibitors because of osteopenia present at baseline and concerns re. bisphosphonates  (2) local recurrence to the right chest wall documented by biopsy 01-2013 showing an invasive ductal carcinoma, grade 1 or 2, estrogen receptor strongly positive, progesterone receptor and HER-2 negative, with an MIB-1 of 28%.  (3) excision of the right chest wall mass 01/19/2013 with negative but close margins (the deep margin is skeletal muscle).  (4) status post radiation therapy to the right chest wall completed 04/23/2013  (5) Ct chest January 2015 shows small bilateral lung lesions many of which are longstanding; repeat chest CT November 2015 showed no change  (6) anastrozole started January 2015; osteopenia per bone density January 2013 with T -1.5 as lowest score   PLAN: Caitlynne is doing fine on the anastrozole, and the plan is to continue that indefinitely. Clearly she has multiple other serious comorbid conditions which I think are much more important than her breast cancer.  The one major concern regarding anastrozole is bone density. We will repeat that test in January 2017. If there is significant worsening as compared to prior we will consider Reclast  Otherwise Taniyah will return to see me in 6 months. She knows to call for any problems that may develop before that visit.     Chauncey Cruel, MD   06/17/2014

## 2014-06-18 ENCOUNTER — Telehealth: Payer: Self-pay | Admitting: Oncology

## 2014-06-18 NOTE — Telephone Encounter (Signed)
called and left a message with her December appointment

## 2014-06-20 ENCOUNTER — Ambulatory Visit (INDEPENDENT_AMBULATORY_CARE_PROVIDER_SITE_OTHER): Payer: Medicare Other

## 2014-06-20 DIAGNOSIS — J309 Allergic rhinitis, unspecified: Secondary | ICD-10-CM | POA: Diagnosis not present

## 2014-06-21 ENCOUNTER — Telehealth: Payer: Self-pay | Admitting: *Deleted

## 2014-06-21 ENCOUNTER — Other Ambulatory Visit: Payer: Self-pay

## 2014-06-21 NOTE — Telephone Encounter (Signed)
LMOVM w/ my direct #  Re: schedule appt w/ Amber due to ERI per TTM

## 2014-06-21 NOTE — Telephone Encounter (Signed)
Spoke w/ Debra Lynn regarding ERI. Appt made w/ Dr. Rayann Heman to discuss changeout.   ROV w/ Dr. Rayann Heman 06/24/14. Pt dependent.

## 2014-06-24 ENCOUNTER — Encounter: Payer: Self-pay | Admitting: *Deleted

## 2014-06-24 ENCOUNTER — Ambulatory Visit (INDEPENDENT_AMBULATORY_CARE_PROVIDER_SITE_OTHER): Payer: Medicare Other | Admitting: Internal Medicine

## 2014-06-24 ENCOUNTER — Encounter: Payer: Self-pay | Admitting: Internal Medicine

## 2014-06-24 ENCOUNTER — Telehealth: Payer: Self-pay | Admitting: *Deleted

## 2014-06-24 ENCOUNTER — Other Ambulatory Visit: Payer: Medicare Other

## 2014-06-24 VITALS — BP 130/78 | HR 73 | Ht 63.0 in | Wt 150.4 lb

## 2014-06-24 DIAGNOSIS — Z950 Presence of cardiac pacemaker: Secondary | ICD-10-CM

## 2014-06-24 DIAGNOSIS — I480 Paroxysmal atrial fibrillation: Secondary | ICD-10-CM | POA: Diagnosis not present

## 2014-06-24 DIAGNOSIS — I442 Atrioventricular block, complete: Secondary | ICD-10-CM | POA: Diagnosis not present

## 2014-06-24 DIAGNOSIS — I495 Sick sinus syndrome: Secondary | ICD-10-CM | POA: Diagnosis not present

## 2014-06-24 LAB — CBC WITH DIFFERENTIAL/PLATELET
Basophils Absolute: 0 10*3/uL (ref 0.0–0.1)
Basophils Relative: 0.4 % (ref 0.0–3.0)
Eosinophils Absolute: 0.3 10*3/uL (ref 0.0–0.7)
Eosinophils Relative: 3.2 % (ref 0.0–5.0)
HCT: 40.3 % (ref 36.0–46.0)
Hemoglobin: 13.8 g/dL (ref 12.0–15.0)
Lymphocytes Relative: 25.4 % (ref 12.0–46.0)
Lymphs Abs: 2.1 10*3/uL (ref 0.7–4.0)
MCHC: 34.3 g/dL (ref 30.0–36.0)
MCV: 87.3 fl (ref 78.0–100.0)
Monocytes Absolute: 0.6 10*3/uL (ref 0.1–1.0)
Monocytes Relative: 7.7 % (ref 3.0–12.0)
Neutro Abs: 5.3 10*3/uL (ref 1.4–7.7)
Neutrophils Relative %: 63.3 % (ref 43.0–77.0)
Platelets: 191 10*3/uL (ref 150.0–400.0)
RBC: 4.62 Mil/uL (ref 3.87–5.11)
RDW: 13.8 % (ref 11.5–15.5)
WBC: 8.4 10*3/uL (ref 4.0–10.5)

## 2014-06-24 LAB — BASIC METABOLIC PANEL
BUN: 20 mg/dL (ref 6–23)
CO2: 32 mEq/L (ref 19–32)
Calcium: 10.1 mg/dL (ref 8.4–10.5)
Chloride: 99 mEq/L (ref 96–112)
Creatinine, Ser: 0.9 mg/dL (ref 0.40–1.20)
GFR: 63.47 mL/min (ref >60.00)
Glucose, Bld: 91 mg/dL (ref 70–99)
Potassium: 3.9 mEq/L (ref 3.5–5.1)
Sodium: 138 mEq/L (ref 135–145)

## 2014-06-24 LAB — CUP PACEART INCLINIC DEVICE CHECK
Brady Statistic RA Percent Paced: 50 %
Brady Statistic RV Percent Paced: 99 %
Date Time Interrogation Session: 20160523172125
Lead Channel Impedance Value: 1274 Ohm
Lead Channel Impedance Value: 593 Ohm
Lead Channel Impedance Value: 974 Ohm
Lead Channel Setting Pacing Amplitude: 2.5 V
Lead Channel Setting Pacing Amplitude: 3 V
Lead Channel Setting Pacing Amplitude: 3.25 V
Lead Channel Setting Pacing Pulse Width: 0.8 ms
Lead Channel Setting Sensing Sensitivity: 4 mV
Lead Channel Setting Sensing Sensitivity: 4 mV
Pulse Gen Model: 5586
Pulse Gen Serial Number: 1908386

## 2014-06-24 LAB — PROTIME-INR
INR: 2.3 ratio — ABNORMAL HIGH (ref 0.8–1.0)
Prothrombin Time: 24.6 s — ABNORMAL HIGH (ref 9.6–13.1)

## 2014-06-24 NOTE — Telephone Encounter (Signed)
Pt is to have pacemaker generator change and is scheduled for May 26th Having PT/INR done per lab today Appt changed to be seen when returns for wound check on June 6th and instructed to call regarding any change in her coumadin dose per MD and she states understanding.

## 2014-06-24 NOTE — Progress Notes (Signed)
Debra Evener, MD: Primary Cardiologist:  [name] is a 81 y.o. female with a h/o complete heart block sp PPM with upgrade to a BiV pacemaker (SJM) by Dr Leonia Reeves in 2008 who presents today for routine follow up care in the Electrophysiology device clinic.  She has reached ERI battery status.   She denise any awareness of afib.     Today, she  denies symptoms of palpitations, chest pain, shortness of breath, orthopnea, PND, lower extremity edema,  presyncope, syncope, or neurologic sequela. Positive for chronic dizziness.  The patientis tolerating medications without difficulties and is otherwise without complaint today.   Past Medical History  Diagnosis Date  . Complete heart block   . Glaucoma   . Seasonal allergic rhinitis     Pos Skin Test 07-05-07  . GERD (gastroesophageal reflux disease)   . Atrial fibrillation     s/p AV nodal ablation/ PPM  . Hypertension   . Hyperlipidemia   . Arthritis KNEES &BACK  . Neuromuscular disorder SLIGHT TREMOR.  . Bleeding from breast     right  . Pericarditis     now "fluid around heart"  . Stroke 2010, 2014    x2, "big one in 2010"  . Seizures 2013    "think had one"  . Parkinsonism   . Headache(784.0)     sinus headaches  . Compression fracture a long time ago    "back fractured when heart stopped"  . Balance problem   . Orthostatic hypotension   . Light headedness   . CVA 05/07/2008    Qualifier: History of  By: Annamaria Boots MD, Kasandra Knudsen   . Cerebrovascular disease   . Cancer 12/2010    breast with recurrence s/p recent XRT  . Pure hypercholesterolemia   . Essential and other specified forms of tremor   . Esophageal reflux   . Thoracic aneurysm without mention of rupture   . 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 Fibrocystic breast   . Chronic cough   . H/O prior ablation treatment     Failed SVT ablation in 1980s  . Essential tremor   . Overactive bladder   . SOB (shortness of breath)     . Long term (current) use of anticoagulants    Past Surgical History  Procedure Laterality Date  . Cholecystectomy    . Vesicovaginal fistula closure w/ tah    . Ablation  1980s    Failed SVT ablation  . Flexible bronchoscopy w/ upper endoscopy      and swallowing evaluation  . Pacemaker insertion    . Knee arthroscopy Right     Arthroscopic for torn meniscus  . Cataract extraction Bilateral   . Abdominal hysterectomy  1975    pt. unsure if laparascopic/vaginal/abdominal  . Breast surgery Right 01/15/11    mastectomy  . Bi-ventricular pacemaker upgrade  01/2007    3rd device, upgraded to SJM Frontier II BiV pacemaker by Dr Leonia Reeves  . Mass excision Right 01/19/2013    Procedure: EXCISION CHEST WALL MASS;  Surgeon: [name], MD;  Location: [facility];  Service: General;  Laterality: Right;  . Eye surgery      For glaucoma  . Appendectomy      History   Social History  . Marital Status: Married    Spouse Name: N/A  . Number of Children: 2  . Years of Education: N/A   Occupational History  . retired Merchandiser, retail  teller    Social History Main Topics  . Smoking status: Never Smoker   . Smokeless tobacco: Never Used  . Alcohol Use: No  . Drug Use: No  . Sexual Activity: Not on file   Other Topics Concern  . Not on file   Social History Narrative    Family History  Problem Relation Age of Onset  . Asthma Brother   . Asthma Daughter   . Heart disease    . Cancer    . Heart failure Mother   . Cancer Father     brain  . Lymphoma Brother   . Diabetes Brother   . Hypertension Brother   . Heart attack Brother     No Known Allergies  Current Outpatient Prescriptions  Medication Sig Dispense Refill  . acetaminophen (TYLENOL) 500 MG tablet Take 1,000 mg by mouth every 6 (six) hours as needed for moderate pain (pain).     Marland Kitchen anastrozole (ARIMIDEX) 1 MG tablet Take 1 tablet by mouth  daily 90 tablet 4  . benzonatate (TESSALON) 100 MG capsule TAKE 1 CAPSULE BY MOUTH  3TIMES A DAY AS NEEDED FORCOUGH 90 capsule 3  . Calcium Carbonate-Vitamin D (CALCIUM-VITAMIN D) 500-200 MG-UNIT per tablet Take 1 tablet by mouth daily.     . carbidopa-levodopa (SINEMET IR) 25-100 MG per tablet Take 1 tablet by mouth 3 (three) times daily.    . cephALEXin (KEFLEX) 250 MG capsule Take 1 capsule by mouth at bedtime.     . cetirizine (ZYRTEC) 10 MG tablet Take 10 mg by mouth at bedtime as needed for allergies or rhinitis.     . colestipol (COLESTID) 1 G tablet Take 1 g by mouth at bedtime.    . furosemide (LASIX) 40 MG tablet Take 1 tablet (40 mg total) by mouth every morning. 90 tablet 3  . levETIRAcetam (KEPPRA) 500 MG tablet Take 250 mg by mouth every 12 (twelve) hours.    . Multiple Vitamin (MULTIVITAMIN WITH MINERALS) TABS tablet Take 1 tablet by mouth daily.    . pantoprazole (PROTONIX) 40 MG tablet Take 1 tablet by mouth daily.    . potassium chloride SA (K-DUR,KLOR-CON) 20 MEQ tablet Take 1 tablet by mouth  daily 90 tablet 1  . pravastatin (PRAVACHOL) 80 MG tablet Take 1 tablet by mouth  daily 90 tablet 3  . Probiotic Product (ALIGN) 4 MG CAPS Take 4 mg by mouth daily. 30 capsule 0  . propranolol (INDERAL) 60 MG tablet Take 0.5 tablets (30 mg total) by mouth 2 (two) times daily. 90 tablet 3  . warfarin (COUMADIN) 2.5 MG tablet Take 1 tablet by mouth daily or as directed by coumadin clinic 90 tablet 1   No current facility-administered medications for this visit.    ROS- all systems are reviewed and negative except as per HPI  Physical Exam: Filed Vitals:   06/24/14 1444  BP: 130/78  Pulse: 73  Height: 5\' 3"  (1.6 m)  Weight: 68.221 kg (150 lb 6.4 oz)    GEN- The patient is elderly but pleasant appearing, alert and oriented x 3 today.   Head- normocephalic, atraumatic Eyes-  Sclera clear, conjunctiva pink Ears- hearing intact Oropharynx- clear Neck- supple  Lungs- Clear to ausculation bilaterally, normal work of breathing Chest- R sided pacemaker pocket is  well healed Heart- Regular rate and rhythm (paced) GI- soft, NT, ND, + BS Extremities- no clubbing, cyanosis, or edema MS- no significant deformity or atrophy Skin- no rash or lesion Psych- euthymic  mood, full affect Neuro- strength and sensation are intact  Pacemaker interrogation- reviewed in detail today,  See PACEART report ekg today reveals sinus with BiV pacing  Assessment and Plan:  1. Complete heart block Normal BiV pacemaker function See Pace Art report She has reached ERI.  She has responded to CRT. Risks, benefits, and alternatives to CRT-P pulse generator replacement were discussed in detail today.  The patient understands that risks include but are not limited to bleeding, infection, pneumothorax, perforation, tamponade, vascular damage, renal failure, MI, stroke, death,  damage to his existing leads, and lead dislodgement and wishes to proceed.  We will therefore schedule the procedure at the next available time.  Check INR prior to the procedure to guide anticoagulation perioperatively.  Given prior stroke, I would like to keep INR >2.  2. Chronic systolic dysfunction EF has normalized with CRT (by echo 7/14) No changes today  3. HTN Stable No change required today  4. afib Rates are controlled Continue coumadin long term  5. Chronic dizziness Fall precautions

## 2014-06-24 NOTE — Patient Instructions (Signed)
Medication Instructions:  Your physician recommends that you continue on your current medications as directed. Please refer to the Current Medication list given to you today.   Labwork: Your physician recommends that you return for lab work today: BMP/CBC/INR   Testing/Procedure:  Pacemaker generator change Thurs 06/27/14---see instruction sheet   Follow-Up: Your physician recommends that you schedule a follow-up appointment in: 10 days from 06/27/14 in device clinic for wound check   Any Other Special Instructions Will Be Listed Below (If Applicable).

## 2014-06-26 ENCOUNTER — Ambulatory Visit (INDEPENDENT_AMBULATORY_CARE_PROVIDER_SITE_OTHER): Payer: Medicare Other

## 2014-06-26 ENCOUNTER — Telehealth: Payer: Self-pay | Admitting: Internal Medicine

## 2014-06-26 DIAGNOSIS — J309 Allergic rhinitis, unspecified: Secondary | ICD-10-CM | POA: Diagnosis not present

## 2014-06-26 NOTE — Telephone Encounter (Signed)
New message    Daughter calling    Wants to know what her mother INR last night.   Mother did not go to coumadin clinic.

## 2014-06-26 NOTE — Telephone Encounter (Signed)
Spoke with daughter INR 2.3 okay to take regular dose tonight

## 2014-06-27 ENCOUNTER — Encounter (HOSPITAL_COMMUNITY): Payer: Self-pay | Admitting: Cardiology

## 2014-06-27 ENCOUNTER — Ambulatory Visit: Payer: Medicare Other

## 2014-06-27 ENCOUNTER — Ambulatory Visit (HOSPITAL_COMMUNITY)
Admission: RE | Admit: 2014-06-27 | Discharge: 2014-06-27 | Disposition: A | Discharge status: Home / Self Care | Payer: Medicare Other | Source: Ambulatory Visit | Attending: Internal Medicine | Admitting: Internal Medicine

## 2014-06-27 ENCOUNTER — Encounter (HOSPITAL_COMMUNITY)
Admission: RE | Disposition: A | Discharge status: Home / Self Care | Payer: Medicare Other | Source: Ambulatory Visit | Attending: Internal Medicine

## 2014-06-27 DIAGNOSIS — Z853 Personal history of malignant neoplasm of breast: Secondary | ICD-10-CM | POA: Diagnosis not present

## 2014-06-27 DIAGNOSIS — Z8673 Personal history of transient ischemic attack (TIA), and cerebral infarction without residual deficits: Secondary | ICD-10-CM | POA: Diagnosis not present

## 2014-06-27 DIAGNOSIS — Z7901 Long term (current) use of anticoagulants: Secondary | ICD-10-CM | POA: Diagnosis not present

## 2014-06-27 DIAGNOSIS — E780 Pure hypercholesterolemia: Secondary | ICD-10-CM | POA: Insufficient documentation

## 2014-06-27 DIAGNOSIS — I4891 Unspecified atrial fibrillation: Secondary | ICD-10-CM | POA: Diagnosis not present

## 2014-06-27 DIAGNOSIS — Z9049 Acquired absence of other specified parts of digestive tract: Secondary | ICD-10-CM | POA: Insufficient documentation

## 2014-06-27 DIAGNOSIS — G20 Parkinson's disease: Secondary | ICD-10-CM | POA: Insufficient documentation

## 2014-06-27 DIAGNOSIS — I442 Atrioventricular block, complete: Secondary | ICD-10-CM | POA: Insufficient documentation

## 2014-06-27 DIAGNOSIS — E785 Hyperlipidemia, unspecified: Secondary | ICD-10-CM | POA: Diagnosis not present

## 2014-06-27 DIAGNOSIS — K219 Gastro-esophageal reflux disease without esophagitis: Secondary | ICD-10-CM | POA: Diagnosis not present

## 2014-06-27 DIAGNOSIS — I495 Sick sinus syndrome: Secondary | ICD-10-CM

## 2014-06-27 DIAGNOSIS — Z45010 Encounter for checking and testing of cardiac pacemaker pulse generator [battery]: Secondary | ICD-10-CM | POA: Diagnosis not present

## 2014-06-27 DIAGNOSIS — I10 Essential (primary) hypertension: Secondary | ICD-10-CM | POA: Diagnosis not present

## 2014-06-27 DIAGNOSIS — Z9011 Acquired absence of right breast and nipple: Secondary | ICD-10-CM | POA: Diagnosis not present

## 2014-06-27 DIAGNOSIS — Z45018 Encounter for adjustment and management of other part of cardiac pacemaker: Secondary | ICD-10-CM | POA: Diagnosis not present

## 2014-06-27 DIAGNOSIS — H409 Unspecified glaucoma: Secondary | ICD-10-CM | POA: Diagnosis not present

## 2014-06-27 DIAGNOSIS — I480 Paroxysmal atrial fibrillation: Secondary | ICD-10-CM

## 2014-06-27 HISTORY — PX: EP IMPLANTABLE DEVICE: SHX172B

## 2014-06-27 LAB — PROTIME-INR
INR: 2.31 — ABNORMAL HIGH (ref 0.00–1.49)
Prothrombin Time: 25.2 seconds — ABNORMAL HIGH (ref 11.6–15.2)

## 2014-06-27 LAB — SURGICAL PCR SCREEN
MRSA, PCR: NEGATIVE
Staphylococcus aureus: NEGATIVE

## 2014-06-27 SURGERY — PPM/BIV PPM GENERATOR CHANGEOUT
Anesthesia: LOCAL

## 2014-06-27 MED ORDER — CHLORHEXIDINE GLUCONATE 4 % EX LIQD
60.0000 mL | Freq: Once | CUTANEOUS | Status: DC
  Filled 2014-06-27: qty 60

## 2014-06-27 MED ORDER — SODIUM CHLORIDE 0.9 % IR SOLN
80.0000 mg | Status: DC
  Filled 2014-06-27: qty 2

## 2014-06-27 MED ORDER — CEFAZOLIN SODIUM-DEXTROSE 2-3 GM-% IV SOLR: 2.0000 g | INTRAVENOUS | Status: DC

## 2014-06-27 MED ORDER — MUPIROCIN 2 % EX OINT
TOPICAL_OINTMENT | CUTANEOUS | Status: AC
  Administered 2014-06-27: 1 via NASAL
  Filled 2014-06-27: qty 22

## 2014-06-27 MED ORDER — SODIUM CHLORIDE 0.9 % IJ SOLN: 3.0000 mL | INTRAMUSCULAR | Status: DC | PRN

## 2014-06-27 MED ORDER — MUPIROCIN 2 % EX OINT
TOPICAL_OINTMENT | Freq: Two times a day (BID) | CUTANEOUS | Status: DC
  Administered 2014-06-27: 1 via NASAL

## 2014-06-27 MED ORDER — MIDAZOLAM HCL 5 MG/5ML IJ SOLN
INTRAMUSCULAR | Status: AC
  Filled 2014-06-27: qty 5

## 2014-06-27 MED ORDER — LIDOCAINE HCL (PF) 1 % IJ SOLN
INTRAMUSCULAR | Status: AC
  Filled 2014-06-27: qty 30

## 2014-06-27 MED ORDER — SODIUM CHLORIDE 0.9 % IJ SOLN: 3.0000 mL | Freq: Two times a day (BID) | INTRAMUSCULAR | Status: DC

## 2014-06-27 MED ORDER — FENTANYL CITRATE (PF) 100 MCG/2ML IJ SOLN
INTRAMUSCULAR | Status: AC
  Filled 2014-06-27: qty 2

## 2014-06-27 MED ORDER — SODIUM CHLORIDE 0.9 % IV SOLN: 250.0000 mL | INTRAVENOUS | Status: DC | PRN

## 2014-06-27 MED ORDER — DEXTROSE 5 % IV SOLN
2.0000 g | INTRAVENOUS | Status: DC | PRN
  Administered 2014-06-27: 2 g via INTRAVENOUS

## 2014-06-27 MED ORDER — LIDOCAINE HCL (PF) 1 % IJ SOLN
INTRAMUSCULAR | Status: DC | PRN
Start: 2014-06-27 — End: 2014-06-27
  Administered 2014-06-27: 40 mL via INTRADERMAL

## 2014-06-27 MED ORDER — CEFAZOLIN SODIUM-DEXTROSE 2-3 GM-% IV SOLR
INTRAVENOUS | Status: AC
  Filled 2014-06-27: qty 50

## 2014-06-27 MED ORDER — ONDANSETRON HCL 4 MG/2ML IJ SOLN: 4.0000 mg | Freq: Four times a day (QID) | INTRAMUSCULAR | Status: DC | PRN

## 2014-06-27 MED ORDER — SODIUM CHLORIDE 0.9 % IV SOLN
INTRAVENOUS | Status: DC
  Administered 2014-06-27: 13:00:00 via INTRAVENOUS

## 2014-06-27 MED ORDER — ACETAMINOPHEN 325 MG PO TABS
325.0000 mg | ORAL_TABLET | ORAL | Status: DC | PRN
  Filled 2014-06-27: qty 2

## 2014-06-27 SURGICAL SUPPLY — 5 items
CABLE SURGICAL S-101-97-12 (CABLE) IMPLANT
PACEMAKER ALLR CRT-P RF PM3222 (Pacemaker) ×1 IMPLANT
PAD DEFIB LIFELINK (PAD) IMPLANT
PPM ALLURE CRT-P RF PM3222 (Pacemaker) ×2 IMPLANT
TRAY PACEMAKER INSERTION (CUSTOM PROCEDURE TRAY) IMPLANT

## 2014-06-27 NOTE — H&P (View-Only) (Signed)
Debra Evener, MD: Primary Cardiologist:  Dr Debra Lynn is a 81 y.o. female with a h/o complete heart block sp PPM with upgrade to a BiV pacemaker (SJM) by Dr Leonia Reeves in 2008 who presents today for routine follow up care in the Electrophysiology device clinic.  She has reached ERI battery status.   She denise any awareness of afib.     Today, she  denies symptoms of palpitations, chest pain, shortness of breath, orthopnea, PND, lower extremity edema,  presyncope, syncope, or neurologic sequela. Positive for chronic dizziness.  The patientis tolerating medications without difficulties and is otherwise without complaint today.   Past Medical History  Diagnosis Date  . Complete heart block   . Glaucoma   . Seasonal allergic rhinitis     Pos Skin Test 07-05-07  . GERD (gastroesophageal reflux disease)   . Atrial fibrillation     s/p AV nodal ablation/ PPM  . Hypertension   . Hyperlipidemia   . Arthritis KNEES &BACK  . Neuromuscular disorder SLIGHT TREMOR.  . Bleeding from breast     right  . Pericarditis     now "fluid around heart"  . Stroke 2010, 2014    x2, "big one in 2010"  . Seizures 2013    "think had one"  . Parkinsonism   . Headache(784.0)     sinus headaches  . Compression fracture a long time ago    "back fractured when heart stopped"  . Balance problem   . Orthostatic hypotension   . Light headedness   . CVA 05/07/2008    Qualifier: History of  By: Annamaria Boots MD, Kasandra Knudsen   . Cerebrovascular disease   . Cancer 12/2010    breast with recurrence s/p recent XRT  . Pure hypercholesterolemia   . Essential and other specified forms of tremor   . Esophageal reflux   . Thoracic aneurysm without mention of rupture   . 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 Fibrocystic breast   . Chronic cough   . H/O prior ablation treatment     Failed SVT ablation in 1980s  . Essential tremor   . Overactive bladder   . SOB (shortness of breath)     . Long term (current) use of anticoagulants    Past Surgical History  Procedure Laterality Date  . Cholecystectomy    . Vesicovaginal fistula closure w/ tah    . Ablation  1980s    Failed SVT ablation  . Flexible bronchoscopy w/ upper endoscopy      and swallowing evaluation  . Pacemaker insertion    . Knee arthroscopy Right     Arthroscopic for torn meniscus  . Cataract extraction Bilateral   . Abdominal hysterectomy  1975    pt. unsure if laparascopic/vaginal/abdominal  . Breast surgery Right 01/15/11    mastectomy  . Bi-ventricular pacemaker upgrade  01/2007    3rd device, upgraded to SJM Frontier II BiV pacemaker by Dr Leonia Reeves  . Mass excision Right 01/19/2013    Procedure: EXCISION CHEST WALL MASS;  Surgeon: [name], MD;  Location: [facility];  Service: General;  Laterality: Right;  . Eye surgery      For glaucoma  . Appendectomy      History   Social History  . Marital Status: Married    Spouse Name: N/A  . Number of Children: 2  . Years of Education: N/A   Occupational History  . retired Merchandiser, retail  teller    Social History Main Topics  . Smoking status: Never Smoker   . Smokeless tobacco: Never Used  . Alcohol Use: No  . Drug Use: No  . Sexual Activity: Not on file   Other Topics Concern  . Not on file   Social History Narrative    Family History  Problem Relation Age of Onset  . Asthma Brother   . Asthma Daughter   . Heart disease    . Cancer    . Heart failure Mother   . Cancer Father     brain  . Lymphoma Brother   . Diabetes Brother   . Hypertension Brother   . Heart attack Brother     No Known Allergies  Current Outpatient Prescriptions  Medication Sig Dispense Refill  . acetaminophen (TYLENOL) 500 MG tablet Take 1,000 mg by mouth every 6 (six) hours as needed for moderate pain (pain).     Marland Kitchen anastrozole (ARIMIDEX) 1 MG tablet Take 1 tablet by mouth  daily 90 tablet 4  . benzonatate (TESSALON) 100 MG capsule TAKE 1 CAPSULE BY MOUTH  3TIMES A DAY AS NEEDED FORCOUGH 90 capsule 3  . Calcium Carbonate-Vitamin D (CALCIUM-VITAMIN D) 500-200 MG-UNIT per tablet Take 1 tablet by mouth daily.     . carbidopa-levodopa (SINEMET IR) 25-100 MG per tablet Take 1 tablet by mouth 3 (three) times daily.    . cephALEXin (KEFLEX) 250 MG capsule Take 1 capsule by mouth at bedtime.     . cetirizine (ZYRTEC) 10 MG tablet Take 10 mg by mouth at bedtime as needed for allergies or rhinitis.     . colestipol (COLESTID) 1 G tablet Take 1 g by mouth at bedtime.    . furosemide (LASIX) 40 MG tablet Take 1 tablet (40 mg total) by mouth every morning. 90 tablet 3  . levETIRAcetam (KEPPRA) 500 MG tablet Take 250 mg by mouth every 12 (twelve) hours.    . Multiple Vitamin (MULTIVITAMIN WITH MINERALS) TABS tablet Take 1 tablet by mouth daily.    . pantoprazole (PROTONIX) 40 MG tablet Take 1 tablet by mouth daily.    . potassium chloride SA (K-DUR,KLOR-CON) 20 MEQ tablet Take 1 tablet by mouth  daily 90 tablet 1  . pravastatin (PRAVACHOL) 80 MG tablet Take 1 tablet by mouth  daily 90 tablet 3  . Probiotic Product (ALIGN) 4 MG CAPS Take 4 mg by mouth daily. 30 capsule 0  . propranolol (INDERAL) 60 MG tablet Take 0.5 tablets (30 mg total) by mouth 2 (two) times daily. 90 tablet 3  . warfarin (COUMADIN) 2.5 MG tablet Take 1 tablet by mouth daily or as directed by coumadin clinic 90 tablet 1   No current facility-administered medications for this visit.    ROS- all systems are reviewed and negative except as per HPI  Physical Exam: Filed Vitals:   06/24/14 1444  BP: 130/78  Pulse: 73  Height: 5\' 3"  (1.6 m)  Weight: 68.221 kg (150 lb 6.4 oz)    GEN- The patient is elderly but pleasant appearing, alert and oriented x 3 today.   Head- normocephalic, atraumatic Eyes-  Sclera clear, conjunctiva pink Ears- hearing intact Oropharynx- clear Neck- supple  Lungs- Clear to ausculation bilaterally, normal work of breathing Chest- R sided pacemaker pocket is  well healed Heart- Regular rate and rhythm (paced) GI- soft, NT, ND, + BS Extremities- no clubbing, cyanosis, or edema MS- no significant deformity or atrophy Skin- no rash or lesion Psych- euthymic  mood, full affect Neuro- strength and sensation are intact  Pacemaker interrogation- reviewed in detail today,  See PACEART report ekg today reveals sinus with BiV pacing  Assessment and Plan:  1. Complete heart block Normal BiV pacemaker function See Pace Art report She has reached ERI.  She has responded to CRT. Risks, benefits, and alternatives to CRT-P pulse generator replacement were discussed in detail today.  The patient understands that risks include but are not limited to bleeding, infection, pneumothorax, perforation, tamponade, vascular damage, renal failure, MI, stroke, death,  damage to his existing leads, and lead dislodgement and wishes to proceed.  We will therefore schedule the procedure at the next available time.  Check INR prior to the procedure to guide anticoagulation perioperatively.  Given prior stroke, I would like to keep INR >2.  2. Chronic systolic dysfunction EF has normalized with CRT (by echo 7/14) No changes today  3. HTN Stable No change required today  4. afib Rates are controlled Continue coumadin long term  5. Chronic dizziness Fall precautions

## 2014-06-27 NOTE — Discharge Instructions (Signed)
Pacemaker Battery Change, Care After °Refer to this sheet in the next few weeks. These instructions provide you with information on caring for yourself after your procedure. Your health care provider may also give you more specific instructions. Your treatment has been planned according to current medical practices, but problems sometimes occur. Call your health care provider if you have any problems or questions after your procedure. °WHAT TO EXPECT AFTER THE PROCEDURE °After your procedure, it is typical to have the following sensations: °· Soreness at the pacemaker site. °HOME CARE INSTRUCTIONS  °· Keep the incision clean and dry. °· Unless advised otherwise, you may shower beginning 48 hours after your procedure. °· For the first week after the replacement, avoid stretching motions that pull at the incision site, and avoid heavy exercise with the arm that is on the same side as the incision. °· Take medicines only as directed by your health care provider. °· Keep all follow-up visits as directed by your health care provider. °SEEK MEDICAL CARE IF:  °· You have pain at the incision site that is not relieved by over-the-counter or prescription medicine. °· There is drainage or pus from the incision site. °· There is swelling larger than a lime at the incision site. °· You develop red streaking that extends above or below the incision site. °· You feel brief, intermittent palpitations, light-headedness, or any symptoms that you feel might be related to your heart. °SEEK IMMEDIATE MEDICAL CARE IF:  °· You experience chest pain that is different than the pain at the pacemaker site. °· You experience shortness of breath. °· You have palpitations or irregular heartbeat. °· You have light-headedness that does not go away quickly. °· You faint. °· You have pain that gets worse and is not relieved by medicine. °Document Released: 11/08/2012 Document Revised: 06/04/2013 Document Reviewed: 11/08/2012 °[employer]® Patient  Information ©2015 [employer]. This information is not intended to replace advice given to you by your health care provider. Make sure you discuss any questions you have with your health care provider. ° °

## 2014-06-27 NOTE — Interval H&P Note (Signed)
History and Physical Interval Note:  06/27/2014 2:20 PM  Debra Lynn  has presented today for surgery, with the diagnosis of eol  The various methods of treatment have been discussed with the patient and family. After consideration of risks, benefits and other options for treatment, the patient has consented to  Procedure(s): PPM/BIV PPM Generator Changeout (N/A) as a surgical intervention .  The patient's history has been reviewed, patient examined, no change in status, stable for surgery.  I have reviewed the patient's chart and labs.  Questions were answered to the patient's satisfaction.     Thompson Grayer

## 2014-06-28 MED FILL — Gentamicin Sulfate Inj 40 MG/ML: INTRAMUSCULAR | Qty: 80 | Status: AC

## 2014-07-02 ENCOUNTER — Encounter: Payer: Self-pay | Admitting: Internal Medicine

## 2014-07-03 ENCOUNTER — Ambulatory Visit: Payer: Medicare Other

## 2014-07-04 ENCOUNTER — Ambulatory Visit (INDEPENDENT_AMBULATORY_CARE_PROVIDER_SITE_OTHER): Payer: Medicare Other

## 2014-07-04 ENCOUNTER — Ambulatory Visit (INDEPENDENT_AMBULATORY_CARE_PROVIDER_SITE_OTHER): Payer: Medicare Other | Admitting: Pharmacist

## 2014-07-04 ENCOUNTER — Ambulatory Visit (INDEPENDENT_AMBULATORY_CARE_PROVIDER_SITE_OTHER): Payer: Medicare Other | Admitting: *Deleted

## 2014-07-04 ENCOUNTER — Telehealth: Payer: Self-pay | Admitting: Internal Medicine

## 2014-07-04 DIAGNOSIS — I442 Atrioventricular block, complete: Secondary | ICD-10-CM | POA: Diagnosis not present

## 2014-07-04 DIAGNOSIS — I480 Paroxysmal atrial fibrillation: Secondary | ICD-10-CM | POA: Diagnosis not present

## 2014-07-04 DIAGNOSIS — I6789 Other cerebrovascular disease: Secondary | ICD-10-CM | POA: Diagnosis not present

## 2014-07-04 DIAGNOSIS — I4891 Unspecified atrial fibrillation: Secondary | ICD-10-CM | POA: Diagnosis not present

## 2014-07-04 DIAGNOSIS — I482 Chronic atrial fibrillation: Secondary | ICD-10-CM

## 2014-07-04 DIAGNOSIS — J309 Allergic rhinitis, unspecified: Secondary | ICD-10-CM

## 2014-07-04 LAB — CUP PACEART INCLINIC DEVICE CHECK
Battery Remaining Longevity: 73.2 mo
Battery Voltage: 2.99 V
Brady Statistic RA Percent Paced: 35 %
Brady Statistic RV Percent Paced: 99.9 %
Date Time Interrogation Session: 20160602164234
Lead Channel Impedance Value: 550 Ohm
Lead Channel Impedance Value: 925 Ohm
Lead Channel Impedance Value: 950 Ohm
Lead Channel Pacing Threshold Amplitude: 0.875 V
Lead Channel Pacing Threshold Amplitude: 1.25 V
Lead Channel Pacing Threshold Amplitude: 2.25 V
Lead Channel Pacing Threshold Pulse Width: 0.5 ms
Lead Channel Pacing Threshold Pulse Width: 0.7 ms
Lead Channel Pacing Threshold Pulse Width: 0.8 ms
Lead Channel Sensing Intrinsic Amplitude: 2.4 mV
Lead Channel Sensing Intrinsic Amplitude: 9.1 mV
Lead Channel Setting Pacing Amplitude: 1.875
Lead Channel Setting Pacing Amplitude: 2.25 V
Lead Channel Setting Pacing Amplitude: 3.25 V
Lead Channel Setting Pacing Pulse Width: 0.7 ms
Lead Channel Setting Pacing Pulse Width: 0.8 ms
Lead Channel Setting Sensing Sensitivity: 4.5 mV
Pulse Gen Model: 3222
Pulse Gen Serial Number: 7738731

## 2014-07-04 LAB — POCT INR: INR: 2.5

## 2014-07-04 NOTE — Progress Notes (Signed)
Wound check appointment. Steri-strips removed. Wound without redness or edema. Incision edges approximated, wound well healed. Normal device function. Thresholds, sensing, and impedances consistent with implant measurements. Device programmed at 3.5V/auto capture programmed on for extra safety margin until 3 month visit. Histogram distribution appropriate for patient and level of activity. 2 mode switches--longest was 10 seconds. No high ventricular rates noted. Patient educated about wound care, arm mobility, lifting restrictions. ROV in 3 months with 09-30-14 @ 1345 with JA.

## 2014-07-04 NOTE — Telephone Encounter (Signed)
New message  New placer put in.. A little red around the area. Pt is concerned that it may turn into an infection. Please call back to discuss

## 2014-07-05 NOTE — Telephone Encounter (Signed)
Debra Lynn states concern was addressed yesterday by Debra Lynn. Debra Lynn created an appt earlier than the original scheduled appt. No further follow up needed per Debra Lynn.

## 2014-07-08 ENCOUNTER — Ambulatory Visit: Payer: Medicare Other

## 2014-07-10 ENCOUNTER — Encounter: Payer: Self-pay | Admitting: Internal Medicine

## 2014-07-11 ENCOUNTER — Ambulatory Visit (INDEPENDENT_AMBULATORY_CARE_PROVIDER_SITE_OTHER): Payer: Medicare Other

## 2014-07-11 DIAGNOSIS — J309 Allergic rhinitis, unspecified: Secondary | ICD-10-CM | POA: Diagnosis not present

## 2014-07-12 ENCOUNTER — Encounter: Payer: Self-pay | Admitting: Internal Medicine

## 2014-07-17 ENCOUNTER — Encounter: Payer: Self-pay | Admitting: Interventional Cardiology

## 2014-07-18 ENCOUNTER — Ambulatory Visit (INDEPENDENT_AMBULATORY_CARE_PROVIDER_SITE_OTHER): Payer: Medicare Other

## 2014-07-18 ENCOUNTER — Other Ambulatory Visit: Payer: Self-pay | Admitting: *Deleted

## 2014-07-18 DIAGNOSIS — J309 Allergic rhinitis, unspecified: Secondary | ICD-10-CM | POA: Diagnosis not present

## 2014-07-18 DIAGNOSIS — E785 Hyperlipidemia, unspecified: Secondary | ICD-10-CM

## 2014-07-22 ENCOUNTER — Other Ambulatory Visit (INDEPENDENT_AMBULATORY_CARE_PROVIDER_SITE_OTHER): Payer: Medicare Other | Admitting: *Deleted

## 2014-07-22 DIAGNOSIS — E785 Hyperlipidemia, unspecified: Secondary | ICD-10-CM | POA: Diagnosis not present

## 2014-07-22 LAB — HEPATIC FUNCTION PANEL
ALT: 17 U/L (ref 0–35)
AST: 19 U/L (ref 0–37)
Albumin: 4.4 g/dL (ref 3.5–5.2)
Alkaline Phosphatase: 40 U/L (ref 39–117)
Bilirubin, Direct: 0 mg/dL (ref 0.0–0.3)
Total Bilirubin: 0.5 mg/dL (ref 0.2–1.2)
Total Protein: 7.2 g/dL (ref 6.0–8.3)

## 2014-07-22 LAB — LIPID PANEL
Cholesterol: 186 mg/dL (ref 0–200)
HDL: 59 mg/dL (ref >39.00)
NonHDL: 127
Total CHOL/HDL Ratio: 3
Triglycerides: 274 mg/dL — ABNORMAL HIGH (ref 0.0–149.0)
VLDL: 54.8 mg/dL — ABNORMAL HIGH (ref 0.0–40.0)

## 2014-07-22 LAB — LDL CHOLESTEROL, DIRECT: Direct LDL: 84 mg/dL

## 2014-07-24 ENCOUNTER — Telehealth: Payer: Self-pay | Admitting: Interventional Cardiology

## 2014-07-24 NOTE — Telephone Encounter (Signed)
Left message to call back  

## 2014-07-24 NOTE — Telephone Encounter (Signed)
Follow Up       Pt's daughter returning Jennifer's phone call.

## 2014-07-24 NOTE — Telephone Encounter (Signed)
New Message    Debra Lynn is stating she is returning call from [place]  Please give her a call

## 2014-07-24 NOTE — Telephone Encounter (Signed)
Spoke with daughter and informed her of pt's lab results. Daughter verbalized understanding.

## 2014-07-25 ENCOUNTER — Ambulatory Visit (INDEPENDENT_AMBULATORY_CARE_PROVIDER_SITE_OTHER): Payer: Medicare Other

## 2014-07-25 DIAGNOSIS — J309 Allergic rhinitis, unspecified: Secondary | ICD-10-CM

## 2014-08-01 ENCOUNTER — Ambulatory Visit (INDEPENDENT_AMBULATORY_CARE_PROVIDER_SITE_OTHER): Payer: Medicare Other | Admitting: Pharmacist

## 2014-08-01 ENCOUNTER — Ambulatory Visit (INDEPENDENT_AMBULATORY_CARE_PROVIDER_SITE_OTHER): Payer: Medicare Other

## 2014-08-01 DIAGNOSIS — J309 Allergic rhinitis, unspecified: Secondary | ICD-10-CM | POA: Diagnosis not present

## 2014-08-01 DIAGNOSIS — I6789 Other cerebrovascular disease: Secondary | ICD-10-CM | POA: Diagnosis not present

## 2014-08-01 DIAGNOSIS — I4891 Unspecified atrial fibrillation: Secondary | ICD-10-CM | POA: Diagnosis not present

## 2014-08-01 LAB — POCT INR: INR: 2.6

## 2014-08-08 ENCOUNTER — Ambulatory Visit: Payer: Medicare Other

## 2014-08-08 ENCOUNTER — Encounter: Payer: Self-pay | Admitting: Internal Medicine

## 2014-08-10 ENCOUNTER — Other Ambulatory Visit: Payer: Self-pay | Admitting: Internal Medicine

## 2014-08-15 ENCOUNTER — Ambulatory Visit (INDEPENDENT_AMBULATORY_CARE_PROVIDER_SITE_OTHER): Payer: Medicare Other

## 2014-08-15 DIAGNOSIS — J309 Allergic rhinitis, unspecified: Secondary | ICD-10-CM | POA: Diagnosis not present

## 2014-08-19 ENCOUNTER — Other Ambulatory Visit: Payer: Self-pay | Admitting: Interventional Cardiology

## 2014-08-20 ENCOUNTER — Other Ambulatory Visit: Payer: Self-pay

## 2014-08-20 MED ORDER — POTASSIUM CHLORIDE CRYS ER 20 MEQ PO TBCR: EXTENDED_RELEASE_TABLET | ORAL | Status: DC

## 2014-08-22 ENCOUNTER — Ambulatory Visit (INDEPENDENT_AMBULATORY_CARE_PROVIDER_SITE_OTHER): Payer: Medicare Other

## 2014-08-22 ENCOUNTER — Telehealth: Payer: Self-pay | Admitting: Internal Medicine

## 2014-08-22 DIAGNOSIS — J309 Allergic rhinitis, unspecified: Secondary | ICD-10-CM

## 2014-08-22 NOTE — Telephone Encounter (Signed)
Date Mixed: 08/22/14 Vial: 2 Strength: 1:10 Here/Mail/Pick Up: here Mixed By: tbs

## 2014-08-29 ENCOUNTER — Ambulatory Visit (INDEPENDENT_AMBULATORY_CARE_PROVIDER_SITE_OTHER): Payer: Medicare Other

## 2014-08-29 DIAGNOSIS — J309 Allergic rhinitis, unspecified: Secondary | ICD-10-CM | POA: Diagnosis not present

## 2014-09-05 ENCOUNTER — Ambulatory Visit (INDEPENDENT_AMBULATORY_CARE_PROVIDER_SITE_OTHER): Payer: Medicare Other

## 2014-09-05 DIAGNOSIS — J309 Allergic rhinitis, unspecified: Secondary | ICD-10-CM

## 2014-09-12 ENCOUNTER — Ambulatory Visit (INDEPENDENT_AMBULATORY_CARE_PROVIDER_SITE_OTHER): Payer: Medicare Other | Admitting: *Deleted

## 2014-09-12 ENCOUNTER — Ambulatory Visit (INDEPENDENT_AMBULATORY_CARE_PROVIDER_SITE_OTHER): Payer: Medicare Other

## 2014-09-12 DIAGNOSIS — J309 Allergic rhinitis, unspecified: Secondary | ICD-10-CM

## 2014-09-12 DIAGNOSIS — I6789 Other cerebrovascular disease: Secondary | ICD-10-CM

## 2014-09-12 DIAGNOSIS — I4891 Unspecified atrial fibrillation: Secondary | ICD-10-CM

## 2014-09-12 LAB — POCT INR: INR: 2.5

## 2014-09-19 ENCOUNTER — Ambulatory Visit (INDEPENDENT_AMBULATORY_CARE_PROVIDER_SITE_OTHER): Payer: Medicare Other

## 2014-09-19 DIAGNOSIS — J309 Allergic rhinitis, unspecified: Secondary | ICD-10-CM

## 2014-09-26 ENCOUNTER — Ambulatory Visit (INDEPENDENT_AMBULATORY_CARE_PROVIDER_SITE_OTHER): Payer: Medicare Other

## 2014-09-26 ENCOUNTER — Encounter: Payer: Self-pay | Admitting: Internal Medicine

## 2014-09-26 DIAGNOSIS — J309 Allergic rhinitis, unspecified: Secondary | ICD-10-CM | POA: Diagnosis not present

## 2014-09-30 ENCOUNTER — Ambulatory Visit (INDEPENDENT_AMBULATORY_CARE_PROVIDER_SITE_OTHER): Payer: Medicare Other | Admitting: Internal Medicine

## 2014-09-30 ENCOUNTER — Encounter: Payer: Self-pay | Admitting: Internal Medicine

## 2014-09-30 ENCOUNTER — Encounter: Payer: Medicare Other | Admitting: Internal Medicine

## 2014-09-30 VITALS — BP 122/68 | HR 64 | Ht 63.0 in | Wt 150.0 lb

## 2014-09-30 DIAGNOSIS — I10 Essential (primary) hypertension: Secondary | ICD-10-CM | POA: Diagnosis not present

## 2014-09-30 DIAGNOSIS — I480 Paroxysmal atrial fibrillation: Secondary | ICD-10-CM

## 2014-09-30 DIAGNOSIS — I442 Atrioventricular block, complete: Secondary | ICD-10-CM | POA: Diagnosis not present

## 2014-09-30 LAB — CUP PACEART INCLINIC DEVICE CHECK
Battery Remaining Longevity: 70.8 mo
Battery Voltage: 2.99 V
Brady Statistic RA Percent Paced: 37 %
Brady Statistic RV Percent Paced: 99.4 %
Date Time Interrogation Session: 20160829114155
Lead Channel Impedance Value: 1000 Ohm
Lead Channel Impedance Value: 587.5 Ohm
Lead Channel Impedance Value: 900 Ohm
Lead Channel Pacing Threshold Amplitude: 1.25 V
Lead Channel Pacing Threshold Amplitude: 1.25 V
Lead Channel Pacing Threshold Amplitude: 1.25 V
Lead Channel Pacing Threshold Amplitude: 2.25 V
Lead Channel Pacing Threshold Amplitude: 2.25 V
Lead Channel Pacing Threshold Pulse Width: 0.5 ms
Lead Channel Pacing Threshold Pulse Width: 0.7 ms
Lead Channel Pacing Threshold Pulse Width: 0.7 ms
Lead Channel Pacing Threshold Pulse Width: 0.8 ms
Lead Channel Pacing Threshold Pulse Width: 0.8 ms
Lead Channel Sensing Intrinsic Amplitude: 3.1 mV
Lead Channel Setting Pacing Amplitude: 2.125
Lead Channel Setting Pacing Amplitude: 2.125
Lead Channel Setting Pacing Amplitude: 3.375
Lead Channel Setting Pacing Pulse Width: 0.7 ms
Lead Channel Setting Pacing Pulse Width: 0.8 ms
Lead Channel Setting Sensing Sensitivity: 4.5 mV
Pulse Gen Model: 3222
Pulse Gen Serial Number: 7738731

## 2014-09-30 NOTE — Progress Notes (Signed)
Debra Evener, MD: Primary Cardiologist:  [name] is a 81 y.o. female with a h/o complete heart block sp BiV PPM who presents today for routine follow up care in the Electrophysiology device clinic.  Since her gen change 5/16, she has done well.   Today, she  denies symptoms of palpitations, chest pain, shortness of breath, orthopnea, PND, lower extremity edema,  presyncope, syncope, or neurologic sequela. Positive for chronic dizziness.  The patientis tolerating medications without difficulties and is otherwise without complaint today.   Past Medical History  Diagnosis Date  . Complete heart block     a. s/p STJ CRTP  . Glaucoma   . Seasonal allergic rhinitis     Pos Skin Test 07-05-07  . Atrial fibrillation     a. s/p AVN ablation  . Hypertension   . Hyperlipidemia   . Arthritis    . Pericarditis        . Seizures 2013       . Parkinsonism   . Compression fracture         . Orthostatic hypotension   . CVA 05/07/2008       . Cancer 12/2010    breast with recurrence s/p recent XRT  . Thoracic aneurysm without mention of rupture   . 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 Fibrocystic breast   . Overactive bladder   . GERD (gastroesophageal reflux disease)    Past Surgical History  Procedure Laterality Date  . Cholecystectomy    . Vesicovaginal fistula closure w/ tah    . Ablation  1980s    Failed SVT ablation  . Flexible bronchoscopy w/ upper endoscopy      and swallowing evaluation  . Pacemaker insertion    . Knee arthroscopy Right     Arthroscopic for torn meniscus  . Cataract extraction Bilateral   . Abdominal hysterectomy  1975    pt. unsure if laparascopic/vaginal/abdominal  . Breast surgery Right 01/15/11    mastectomy  . Bi-ventricular pacemaker upgrade  01/2007    3rd device, upgraded to SJM Frontier II BiV pacemaker by Dr Leonia Reeves  . Mass excision Right 01/19/2013    Procedure: EXCISION CHEST WALL MASS;   Surgeon: [name], MD;  Location: [facility];  Service: General;  Laterality: Right;  . Eye surgery      For glaucoma  . Appendectomy    . Ep implantable device N/A 06/27/2014    CRT-P  (SJM) gen change by Dr Rayann Heman    Social History   Social History  . Marital Status: Married    Spouse Name: N/A  . Number of Children: 2  . Years of Education: N/A   Occupational History  . retired Secretary/administrator    Social History Main Topics  . Smoking status: Never Smoker   . Smokeless tobacco: Never Used  . Alcohol Use: No  . Drug Use: No  . Sexual Activity: Not on file   Other Topics Concern  . Not on file   Social History Narrative    Family History  Problem Relation Age of Onset  . Asthma Brother   . Asthma Daughter   . Heart disease    . Cancer    . Heart failure Mother   . Cancer Father     brain  . Lymphoma Brother   . Diabetes Brother   . Hypertension Brother   . Heart attack Brother  No Known Allergies  Current Outpatient Prescriptions  Medication Sig Dispense Refill  . acetaminophen (TYLENOL) 500 MG tablet Take 1,000 mg by mouth every 6 (six) hours as needed for moderate pain (pain).     Marland Kitchen anastrozole (ARIMIDEX) 1 MG tablet Take 1 tablet by mouth  daily 90 tablet 4  . benzonatate (TESSALON) 100 MG capsule TAKE 1 CAPSULE BY MOUTH 3TIMES A DAY AS NEEDED FORCOUGH 90 capsule 3  . Calcium Carbonate-Vitamin D (CALCIUM-VITAMIN D) 500-200 MG-UNIT per tablet Take 1 tablet by mouth daily.     . carbidopa-levodopa (SINEMET IR) 25-100 MG per tablet Take 1 tablet by mouth 3 (three) times daily.    . cephALEXin (KEFLEX) 250 MG capsule Take 1 capsule by mouth at bedtime.     . cetirizine (ZYRTEC) 10 MG tablet Take 10 mg by mouth at bedtime as needed for allergies or rhinitis.     . colestipol (COLESTID) 1 G tablet Take 1 g by mouth at bedtime.    . furosemide (LASIX) 40 MG tablet Take 1 tablet by mouth  every morning 90 tablet 3  . levETIRAcetam (KEPPRA) 500 MG tablet Take  250 mg by mouth every 12 (twelve) hours.    . Multiple Vitamin (MULTIVITAMIN WITH MINERALS) TABS tablet Take 1 tablet by mouth daily.    . NONFORMULARY OR COMPOUNDED ITEM Allergy Vaccine 1:10 Given at [facility]    . pantoprazole (PROTONIX) 40 MG tablet Take 1 tablet by mouth daily.    . potassium chloride SA (K-DUR,KLOR-CON) 20 MEQ tablet Take 1 tablet by mouth  daily 90 tablet 0  . pravastatin (PRAVACHOL) 80 MG tablet Take 1 tablet by mouth  daily 90 tablet 3  . Probiotic Product (ALIGN) 4 MG CAPS Take 4 mg by mouth daily. 30 capsule 0  . propranolol (INDERAL) 60 MG tablet Take one-half tablet by  mouth twice a day 90 tablet 3  . warfarin (COUMADIN) 2.5 MG tablet Take as directed by coumadin clinic 90 tablet 0   No current facility-administered medications for this visit.    ROS- all systems are reviewed and negative except as per HPI  Physical Exam: Filed Vitals:   09/30/14 1049  BP: 122/68  Pulse: 64  Height: 5\' 3"  (1.6 m)  Weight: 68.04 kg (150 lb)    GEN- The patient is elderly but pleasant appearing, alert and oriented x 3 today.   Head- normocephalic, atraumatic Eyes-  Sclera clear, conjunctiva pink Ears- hearing intact Oropharynx- clear Neck- supple  Lungs- Clear to ausculation bilaterally, normal work of breathing Chest- R sided pacemaker pocket is well healed Heart- Regular rate and rhythm (paced) GI- soft, NT, ND, + BS Extremities- no clubbing, cyanosis, or edema MS- no significant deformity or atrophy Skin- no rash or lesion Psych- euthymic mood, full affect Neuro- strength and sensation are intact  Pacemaker interrogation- reviewed in detail today,  See PACEART report ekg today reveals sinus with BiV pacing  Assessment and Plan:  1. Complete heart block Normal BiV pacemaker function See Pace Art report Doing well s/p gen change  2. Chronic systolic dysfunction EF has normalized with CRT (by echo 7/14) No changes today  3. HTN Stable No  change required today  4. afib Rates are controlled Continue coumadin long term  [facility] remote monitoring Follow-up with Dr Irish Lack as scheduled Return to see EP NP in 1 year  Thompson Grayer MD, [facility] 09/30/2014 10:58 AM

## 2014-09-30 NOTE — Patient Instructions (Signed)
Medication Instructions:  Your physician recommends that you continue on your current medications as directed. Please refer to the Current Medication list given to you today.   Labwork: None ordered  Testing/Procedures: None ordered  Follow-Up: Remote monitoring is used to monitor your Pacemaker  from home. This monitoring reduces the number of office visits required to check your device to one time per year. It allows [place] to keep an eye on the functioning of your device to ensure it is working properly. You are scheduled for a device check from home on 12/30/14. You may send your transmission at any time that day. If you have a wireless device, the transmission will be sent automatically. After your physician reviews your transmission, you will receive a postcard with your next transmission date.   Your physician wants you to follow-up in: 12 months with Chanetta Marshall, NP You will receive a reminder letter in the mail two months in advance. If you don't receive a letter, please call our office to schedule the follow-up appointment.   Any Other Special Instructions Will Be Listed Below (If Applicable).

## 2014-10-03 ENCOUNTER — Ambulatory Visit (INDEPENDENT_AMBULATORY_CARE_PROVIDER_SITE_OTHER): Payer: Medicare Other

## 2014-10-03 DIAGNOSIS — J309 Allergic rhinitis, unspecified: Secondary | ICD-10-CM

## 2014-10-10 ENCOUNTER — Ambulatory Visit (INDEPENDENT_AMBULATORY_CARE_PROVIDER_SITE_OTHER): Payer: Medicare Other

## 2014-10-10 ENCOUNTER — Other Ambulatory Visit: Payer: Self-pay | Admitting: Interventional Cardiology

## 2014-10-10 DIAGNOSIS — J309 Allergic rhinitis, unspecified: Secondary | ICD-10-CM

## 2014-10-17 ENCOUNTER — Ambulatory Visit (INDEPENDENT_AMBULATORY_CARE_PROVIDER_SITE_OTHER): Payer: Medicare Other

## 2014-10-17 DIAGNOSIS — Z23 Encounter for immunization: Secondary | ICD-10-CM

## 2014-10-17 DIAGNOSIS — J309 Allergic rhinitis, unspecified: Secondary | ICD-10-CM

## 2014-10-18 ENCOUNTER — Telehealth: Payer: Self-pay | Admitting: Pharmacist

## 2014-10-18 NOTE — Telephone Encounter (Signed)
Phone call from [facility] stating that they are switching manufacturers for warfarin. Patient has already approved this change. Gave verbal confirmation on change. Pt scheduled for f/u in coumadin clinic next week.

## 2014-10-24 ENCOUNTER — Ambulatory Visit (INDEPENDENT_AMBULATORY_CARE_PROVIDER_SITE_OTHER): Payer: Medicare Other

## 2014-10-24 ENCOUNTER — Ambulatory Visit (INDEPENDENT_AMBULATORY_CARE_PROVIDER_SITE_OTHER): Payer: Medicare Other | Admitting: *Deleted

## 2014-10-24 DIAGNOSIS — I4891 Unspecified atrial fibrillation: Secondary | ICD-10-CM | POA: Diagnosis not present

## 2014-10-24 DIAGNOSIS — J309 Allergic rhinitis, unspecified: Secondary | ICD-10-CM | POA: Diagnosis not present

## 2014-10-24 DIAGNOSIS — I6789 Other cerebrovascular disease: Secondary | ICD-10-CM | POA: Diagnosis not present

## 2014-10-24 LAB — POCT INR: INR: 2.5

## 2014-10-31 ENCOUNTER — Ambulatory Visit (INDEPENDENT_AMBULATORY_CARE_PROVIDER_SITE_OTHER): Payer: Medicare Other

## 2014-10-31 DIAGNOSIS — J309 Allergic rhinitis, unspecified: Secondary | ICD-10-CM | POA: Diagnosis not present

## 2014-11-07 ENCOUNTER — Ambulatory Visit (INDEPENDENT_AMBULATORY_CARE_PROVIDER_SITE_OTHER): Payer: Medicare Other

## 2014-11-07 DIAGNOSIS — J309 Allergic rhinitis, unspecified: Secondary | ICD-10-CM

## 2014-11-14 ENCOUNTER — Ambulatory Visit (INDEPENDENT_AMBULATORY_CARE_PROVIDER_SITE_OTHER): Payer: Medicare Other

## 2014-11-14 DIAGNOSIS — J309 Allergic rhinitis, unspecified: Secondary | ICD-10-CM | POA: Diagnosis not present

## 2014-11-21 ENCOUNTER — Ambulatory Visit (INDEPENDENT_AMBULATORY_CARE_PROVIDER_SITE_OTHER): Payer: Medicare Other

## 2014-11-21 DIAGNOSIS — J309 Allergic rhinitis, unspecified: Secondary | ICD-10-CM | POA: Diagnosis not present

## 2014-11-28 ENCOUNTER — Encounter: Payer: Self-pay | Admitting: Internal Medicine

## 2014-11-28 ENCOUNTER — Ambulatory Visit (INDEPENDENT_AMBULATORY_CARE_PROVIDER_SITE_OTHER): Payer: Medicare Other

## 2014-11-28 ENCOUNTER — Ambulatory Visit (INDEPENDENT_AMBULATORY_CARE_PROVIDER_SITE_OTHER): Payer: Medicare Other | Admitting: Internal Medicine

## 2014-11-28 VITALS — BP 114/70 | HR 66 | Ht 63.0 in | Wt 148.0 lb

## 2014-11-28 DIAGNOSIS — J309 Allergic rhinitis, unspecified: Secondary | ICD-10-CM | POA: Diagnosis not present

## 2014-11-28 DIAGNOSIS — R918 Other nonspecific abnormal finding of lung field: Secondary | ICD-10-CM | POA: Diagnosis not present

## 2014-11-28 DIAGNOSIS — J302 Other seasonal allergic rhinitis: Secondary | ICD-10-CM

## 2014-11-28 DIAGNOSIS — J3089 Other allergic rhinitis: Secondary | ICD-10-CM

## 2014-11-28 DIAGNOSIS — Z853 Personal history of malignant neoplasm of breast: Secondary | ICD-10-CM

## 2014-11-28 NOTE — Progress Notes (Signed)
11/06/10- 81 year old female never smoker followed for allergic rhinosinusitis, chronic cough, complicated by CVA, GERD, pacemaker, HBP.Marland Kitchen daughter here Last here -11/07/2009 She got flu vaccine. Continues allergy vaccine at 1-10 without problem. Cough has persisted over the last few months. It occasionally wakes her but she takes a little Tussionex each night to permit the per sleep. Occasional antihistamine. They recognize no relationship to whether or external factors. She may choke a little sometimes while swallowing but it is not much. Breathing is otherwise comfortable. She had a swallowing evaluation years ago. Does not recognize reflux or postnasal drip or wheeze. Since last here had pacemaker placed.  05/27/11- 81 year old female never smoker followed for allergic rhinosinusitis, chronic cough, complicated by CVA, GERD, pacemaker, HBP.Marland Kitchen daughter here Since last here had a right mastectomy without radiation or chemotherapy, on December 14. No respiratory complications with surgery. Has been wheezing in the past month, mostly mild and with exertion. Modified barium swallow 11/09/2010 showed poor esophageal emptying with a reflux cough. CXR 01/13/2011-right pacemaker. Clear lungs. She remains on chronic propranolol. Stopping it did not relieve her cough. She does use Tussionex.  11/20/12- 81 year old female never smoker followed for allergic rhinosinusitis, chronic cough, complicated by CVA, GERD, pacemaker, HBP.Marland Kitchen daughter here Follows For: Dry cough ( some better with Tessalon) - Runny nose for past 2 days - Some sob with exertion -  Has had another CVA. Hx glaucoma sgy.   11/22/13- 81 year old female never smoker followed for allergic rhinosinusitis, chronic cough, lung nodules, complicated by CVA, GERD, pacemaker/ AFib, R mastectomy, HBP.Marland Kitchen daughter here FOLLOW FOR:  Yearly follow up for Allergies    Daughter here    Had flu vax CT had shown small lung nodules- Dr Jana Hakim scheduling f/u CT  Dec, 2015.  Slight nasal drip, Cough much better w Tessalon.  Denies sinus infections- credits allergy vaccine 1:10 GH. We discussed how to assess and can try changing to every other week. CT chest 02/21/13 IMPRESSION:  1. Small lung nodules. The more discrete nodules were present  previously, but have mildly increased in size since the CT dated  06/18/2010. Smaller semi-solid nodules noted on the current exam  were not convincingly present previously.  2. No other evidence suggesting metastatic disease within the chest.  Electronically Signed  By: Lajean Manes M.D.  On: 02/21/2013 14:58  11/28/14- 81 year old female never smoker followed for allergic rhinosinusitis, chronic cough, lung nodules, complicated by CVA, GERD, pacemaker/ AFib, R mastectomy, HBP.Marland Kitchen Husband here            had flu vaccine Allergy vaccine 1:10  FOLLOWS FOR: pt. states she has dry cough and runny nose. denies congestion. still doing vaccine. CT reviewed CT chest 12/11/13 IMPRESSION: 1. Tiny pulmonary nodules which are similar to the prior exam of 02/21/2013. These are nonspecific. They could be post infectious/inflammatory. Metastatic disease is felt unlikely, given stability. 2. No other evidence of metastatic disease within the chest. 3. Borderline aneurysmal dilatation of the ascending aorta, similar 4. Moderate hepatic steatosis. 5. Abberant right subclavian artery. Electronically Signed  By: Abigail Miyamoto M.D.  On: 12/11/2013 16:01   ROS-Per HPI Constitutional:   No-   weight loss, night sweats, fevers, chills, fatigue, lassitude. HEENT:   No-  headaches, difficulty swallowing, tooth/dental problems, sore throat,       No-  sneezing, itching, ear ache, nasal congestion, +post nasal drip,  CV:  No-   chest pain, orthopnea, PND, swelling in lower extremities, anasarca, dizziness, palpitations Resp: No-   shortness of  breath with exertion or at rest.              No-   productive cough,  +  non-productive cough,  No-  coughing up of blood.              No-   change in color of mucus.  + wheezing.   Skin: No-   rash or lesions. GI:  No-   heartburn, indigestion, abdominal pain, nausea, vomiting,  GU:  MS:  No-   joint pain or swelling.   Neuro- nothing unusual Psych:  No- change in mood or affect. No depression or anxiety.  No memory loss.   OBJ General- Alert, Oriented, Affect-appropriate, Distress- none acute Skin- rash-none, lesions- none, excoriation- none Lymphadenopathy- none Head- atraumatic            Eyes- Gross vision intact, PERRLA, conjunctivae clear secretions            Ears- Hearing, canals-normal            Nose- Clear, no-Septal dev, mucus, polyps, erosion, perforation             Throat- Mallampati III , mucosa clear , drainage- none, tonsils- atrophic.+ Hesitant speech/tremor                                      without stridor Neck- flexible , trachea midline, no stridor , thyroid nl, carotid no bruit Chest - symmetrical excursion , unlabored           Heart/CV- RRR , no murmur , no gallop  , no rub, nl s1 s2                           - JVD- none , edema- none, stasis changes- none, varices- none           Lung- clear to P&A, wheeze- none, cough- none , dullness-none, rub- none           Chest wall- pacemaker on right Abd-  Br/ Gen/ Rectal- Not done, not indicated Extrem- cyanosis- none, clubbing, none, atrophy- none, strength- nl Neuro-+ mild tremor

## 2014-11-28 NOTE — Patient Instructions (Addendum)
Order- schedule CT chest no contrast     Dx lung nodules, hx breast Ca  We can continue allergy vaccine for another year, then consider whether to quit.

## 2014-12-01 DIAGNOSIS — R918 Other nonspecific abnormal finding of lung field: Secondary | ICD-10-CM | POA: Insufficient documentation

## 2014-12-01 NOTE — Assessment & Plan Note (Signed)
Nonspecific small nodules of concern because of her history of breast cancer Plan-follow-up CT chest no contrast

## 2014-12-01 NOTE — Assessment & Plan Note (Signed)
Mild nonspecific rhinitis exacerbation may be viral. We discussed continuing vaccine 1 more year than stopping. Symptomatic treatment with nasal sprays and antihistamines as needed.

## 2014-12-05 ENCOUNTER — Ambulatory Visit (INDEPENDENT_AMBULATORY_CARE_PROVIDER_SITE_OTHER)
Admission: RE | Admit: 2014-12-05 | Discharge: 2014-12-05 | Disposition: A | Discharge status: Home / Self Care | Payer: Medicare Other | Source: Ambulatory Visit | Attending: Internal Medicine | Admitting: Internal Medicine

## 2014-12-05 ENCOUNTER — Ambulatory Visit (INDEPENDENT_AMBULATORY_CARE_PROVIDER_SITE_OTHER): Payer: Medicare Other

## 2014-12-05 ENCOUNTER — Ambulatory Visit (INDEPENDENT_AMBULATORY_CARE_PROVIDER_SITE_OTHER): Payer: Medicare Other | Admitting: *Deleted

## 2014-12-05 DIAGNOSIS — R918 Other nonspecific abnormal finding of lung field: Secondary | ICD-10-CM

## 2014-12-05 DIAGNOSIS — I4891 Unspecified atrial fibrillation: Secondary | ICD-10-CM | POA: Diagnosis not present

## 2014-12-05 DIAGNOSIS — I6789 Other cerebrovascular disease: Secondary | ICD-10-CM | POA: Diagnosis not present

## 2014-12-05 DIAGNOSIS — J309 Allergic rhinitis, unspecified: Secondary | ICD-10-CM | POA: Diagnosis not present

## 2014-12-05 DIAGNOSIS — Z853 Personal history of malignant neoplasm of breast: Secondary | ICD-10-CM

## 2014-12-05 LAB — POCT INR: INR: 3

## 2014-12-06 ENCOUNTER — Encounter: Payer: Self-pay | Admitting: Interventional Cardiology

## 2014-12-09 ENCOUNTER — Encounter: Payer: Self-pay | Admitting: Internal Medicine

## 2014-12-09 ENCOUNTER — Other Ambulatory Visit: Payer: Self-pay | Admitting: *Deleted

## 2014-12-09 MED ORDER — BENZONATATE 100 MG PO CAPS: 100.0000 mg | ORAL_CAPSULE | Freq: Three times a day (TID) | ORAL | Status: DC | PRN

## 2014-12-09 NOTE — Progress Notes (Signed)
Quick Note:  ATC pt, LMTCB for pt ______

## 2014-12-12 ENCOUNTER — Ambulatory Visit (INDEPENDENT_AMBULATORY_CARE_PROVIDER_SITE_OTHER): Payer: Medicare Other

## 2014-12-12 DIAGNOSIS — J309 Allergic rhinitis, unspecified: Secondary | ICD-10-CM

## 2014-12-18 ENCOUNTER — Ambulatory Visit (INDEPENDENT_AMBULATORY_CARE_PROVIDER_SITE_OTHER): Payer: Medicare Other

## 2014-12-18 ENCOUNTER — Telehealth: Payer: Self-pay | Admitting: Internal Medicine

## 2014-12-18 DIAGNOSIS — J309 Allergic rhinitis, unspecified: Secondary | ICD-10-CM

## 2014-12-18 NOTE — Telephone Encounter (Signed)
Allergy Serum Extract Date Mixed: 12/18/2014 Vial: AB Strength: 1:10 Here/Mail/Pick Up: Here Mixed By: Desmond Dike, CMA Last OV: 11/28/2014 Pending OV: None

## 2014-12-19 ENCOUNTER — Other Ambulatory Visit: Payer: Self-pay | Admitting: Interventional Cardiology

## 2014-12-19 ENCOUNTER — Ambulatory Visit (INDEPENDENT_AMBULATORY_CARE_PROVIDER_SITE_OTHER): Payer: Medicare Other

## 2014-12-19 DIAGNOSIS — J309 Allergic rhinitis, unspecified: Secondary | ICD-10-CM | POA: Diagnosis not present

## 2015-01-02 ENCOUNTER — Ambulatory Visit (INDEPENDENT_AMBULATORY_CARE_PROVIDER_SITE_OTHER): Payer: Medicare Other

## 2015-01-02 ENCOUNTER — Other Ambulatory Visit: Payer: Self-pay

## 2015-01-02 DIAGNOSIS — C50211 Malignant neoplasm of upper-inner quadrant of right female breast: Secondary | ICD-10-CM

## 2015-01-02 DIAGNOSIS — J309 Allergic rhinitis, unspecified: Secondary | ICD-10-CM | POA: Diagnosis not present

## 2015-01-06 ENCOUNTER — Other Ambulatory Visit (HOSPITAL_BASED_OUTPATIENT_CLINIC_OR_DEPARTMENT_OTHER): Payer: Medicare Other

## 2015-01-06 DIAGNOSIS — C44501 Unspecified malignant neoplasm of skin of breast: Secondary | ICD-10-CM | POA: Diagnosis not present

## 2015-01-06 DIAGNOSIS — C50211 Malignant neoplasm of upper-inner quadrant of right female breast: Secondary | ICD-10-CM

## 2015-01-06 LAB — COMPREHENSIVE METABOLIC PANEL
ALT: 11 U/L (ref 0–55)
AST: 19 U/L (ref 5–34)
Albumin: 4.2 g/dL (ref 3.5–5.0)
Alkaline Phosphatase: 39 U/L — ABNORMAL LOW (ref 40–150)
Anion Gap: 10 mEq/L (ref 3–11)
BUN: 19 mg/dL (ref 7.0–26.0)
CO2: 24 mEq/L (ref 22–29)
Calcium: 10.1 mg/dL (ref 8.4–10.4)
Chloride: 108 mEq/L (ref 98–109)
Creatinine: 1 mg/dL (ref 0.6–1.1)
EGFR: 51 mL/min/{1.73_m2} — ABNORMAL LOW (ref >90)
Glucose: 97 mg/dl (ref 70–140)
Potassium: 3.8 mEq/L (ref 3.5–5.1)
Sodium: 142 mEq/L (ref 136–145)
Total Bilirubin: 0.42 mg/dL (ref 0.20–1.20)
Total Protein: 7.7 g/dL (ref 6.4–8.3)

## 2015-01-06 LAB — CBC WITH DIFFERENTIAL/PLATELET
BASO%: 1 % (ref 0.0–2.0)
Basophils Absolute: 0.1 10*3/uL (ref 0.0–0.1)
EOS%: 5 % (ref 0.0–7.0)
Eosinophils Absolute: 0.3 10*3/uL (ref 0.0–0.5)
HCT: 38.6 % (ref 34.8–46.6)
HGB: 13.2 g/dL (ref 11.6–15.9)
LYMPH%: 30.8 % (ref 14.0–49.7)
MCH: 30.1 pg (ref 25.1–34.0)
MCHC: 34.1 g/dL (ref 31.5–36.0)
MCV: 88.3 fL (ref 79.5–101.0)
MONO#: 0.5 10*3/uL (ref 0.1–0.9)
MONO%: 7.7 % (ref 0.0–14.0)
NEUT#: 3.3 10*3/uL (ref 1.5–6.5)
NEUT%: 55.5 % (ref 38.4–76.8)
Platelets: 171 10*3/uL (ref 145–400)
RBC: 4.37 10*6/uL (ref 3.70–5.45)
RDW: 13.9 % (ref 11.2–14.5)
WBC: 5.9 10*3/uL (ref 3.9–10.3)
lymph#: 1.8 10*3/uL (ref 0.9–3.3)

## 2015-01-09 ENCOUNTER — Ambulatory Visit (INDEPENDENT_AMBULATORY_CARE_PROVIDER_SITE_OTHER): Payer: Medicare Other

## 2015-01-09 DIAGNOSIS — J309 Allergic rhinitis, unspecified: Secondary | ICD-10-CM

## 2015-01-13 ENCOUNTER — Telehealth: Payer: Self-pay | Admitting: Oncology

## 2015-01-13 ENCOUNTER — Ambulatory Visit (HOSPITAL_BASED_OUTPATIENT_CLINIC_OR_DEPARTMENT_OTHER): Payer: Medicare Other | Admitting: Oncology

## 2015-01-13 VITALS — BP 120/60 | HR 72 | Temp 97.5°F | Resp 18 | Ht 63.0 in | Wt 151.0 lb

## 2015-01-13 DIAGNOSIS — C50911 Malignant neoplasm of unspecified site of right female breast: Secondary | ICD-10-CM

## 2015-01-13 DIAGNOSIS — C7989 Secondary malignant neoplasm of other specified sites: Principal | ICD-10-CM

## 2015-01-13 DIAGNOSIS — C50211 Malignant neoplasm of upper-inner quadrant of right female breast: Secondary | ICD-10-CM | POA: Diagnosis not present

## 2015-01-13 MED ORDER — ANASTROZOLE 1 MG PO TABS: ORAL_TABLET | ORAL | Status: DC

## 2015-01-13 MED ORDER — TRAMADOL HCL 50 MG PO TABS: 50.0000 mg | ORAL_TABLET | Freq: Four times a day (QID) | ORAL | Status: DC | PRN

## 2015-01-13 NOTE — Progress Notes (Signed)
ID: ALEXANDR OEHLER   DOB: [birth year]  MR#: [id number]  XQJ#:[id number]  PCP: Aretta Nip, MD GYN: SURolm Bookbinder OTHER KG:YJEHUDJ [name]  CHIEF COMPLAINT: Locally recurrent breast cancer  CURRENT TREATMENT: Anastrozole  BREAST CANCER HISTORY: From the original intake note:  The patient had routine screening mammography 12/15/2010 at [place]. She has heterogeneously dense breast tissue. There was a suggestion of a new mass in the upper inner quadrant of the right breast and she was brought back November 16 for right diagnostic mammography and ultrasonography. Spot compression views confirmed a 1.8 cm multilobulated mass which by ultrasound measured 2.1 cm, was ill-defined and hypoechoic. Biopsies November 19 ([id number]) showed an invasive ductal carcinoma, grade 2, strongly e-cadherin positive, estrogen receptor 90% positive, progesterone receptor negative, with no HER to amplification by CISH. The MIB-1 was 25%.  The patient is not a candidate for MRI because she has a pacemaker in place. Accordingly she underwent BSGI, showing a solitary focus of increased uptake, measuring 2.3 cm. With this information she was seen at the [facility] and a recommendation tomproceed with surgery was made. She had a Right simple mastectomy 01/15/2011.   Her subsequent history is as detailed below.   INTERVAL HISTORY: Jamecia returns today for follow-up of her estrogen receptor positive breast cancer, accompanied by her daughter [name]. Cortnee is on anastrozole, which she tolerates well. Hot flashes and vaginal dryness are not concerns. She does not have the arthralgias or myalgias that many patients can experience on this drug. She obtains it at no cost.  REVIEW OF SYSTEMS: Karolina recently had her pacemaker replaced. She still hurts on that area and sometimes she hurts in the surgical breast as well. She gets up in the morning, washes and dresses, and then does a little bit of housework here in there, with rests  in between. She mostly goes out to see physicians. She is short of breath when walking any distance and keeps a dry cough at times. She has stress urinary incontinence. She feels forgetful, but not confused. Because of the warfarin, she only takes Tylenol for pain and this is not quite doing it at present. She has been having more headaches lately, not necessarily more intense but certainly more frequent. She denies visual changes or nausea or vomiting. They had been no falls. She is now using a Biochemist, clinical, which she likes, and is very excited because she is expecting a great-grandchild in the near future. A detailed review of systems was otherwise stable.  PAST MEDICAL HISTORY: Past Medical History  Diagnosis Date  . Complete heart block (HCC)     a. s/p STJ CRTP  . Glaucoma   . Seasonal allergic rhinitis     Pos Skin Test 07-05-07  . Atrial fibrillation ([place])     a. s/p AVN ablation  . Hypertension   . Hyperlipidemia   . Arthritis    . Pericarditis        . Seizures ([place]) 2013       . Parkinsonism ([place])   . Compression fracture         . Orthostatic hypotension   . CVA 05/07/2008       . Cancer [facility]) 12/2010    breast with recurrence s/p recent XRT  . Thoracic aneurysm without mention of rupture   . 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 Fibrocystic breast   . Overactive bladder   . GERD (gastroesophageal reflux disease)     PAST  SURGICAL HISTORY: Past Surgical History  Procedure Laterality Date  . Cholecystectomy    . Vesicovaginal fistula closure w/ tah    . Ablation  1980s    Failed SVT ablation  . Flexible bronchoscopy w/ upper endoscopy      and swallowing evaluation  . Pacemaker insertion    . Knee arthroscopy Right     Arthroscopic for torn meniscus  . Cataract extraction Bilateral   . Abdominal hysterectomy  1975    pt. unsure if laparascopic/vaginal/abdominal  . Breast surgery Right 01/15/11    mastectomy  . Bi-ventricular pacemaker  upgrade  01/2007    3rd device, upgraded to SJM Frontier II BiV pacemaker by Dr Leonia Reeves  . Mass excision Right 01/19/2013    Procedure: EXCISION CHEST WALL MASS;  Surgeon: [name], MD;  Location: [facility];  Service: General;  Laterality: Right;  . Eye surgery      For glaucoma  . Appendectomy    . Ep implantable device N/A 06/27/2014    CRT-P  (SJM) gen change by Dr Rayann Heman    FAMILY HISTORY Family History  Problem Relation Age of Onset  . Asthma Brother   . Asthma Daughter   . Heart disease    . Cancer    . Heart failure Mother   . Cancer Father     brain  . Lymphoma Brother   . Diabetes Brother   . Hypertension Brother   . Heart attack Brother   The patient's father died at the age of 26 from central nervous system cancer. The patient's mother died at the age of 15. The patient had one brother who had a history of lymphoma, but survives.  GYNECOLOGIC HISTORY: GX P2. 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 Her husband [name] used to work for [employer]. Daughter [name] lives in [place] and is a retired Merchant navy officer. [name] is a widow of my former patient "[name]" [name] Daughter Selam Pietsch lives in [place] where she works as a Orthoptist. The patient has 2 grandsons. She is not a Ambulance person.    ADVANCED DIRECTIVES: in place  HEALTH MAINTENANCE: Social History  Substance Use Topics  . Smoking status: Never Smoker   . Smokeless tobacco: Never Used  . Alcohol Use: No     Colonoscopy:  PAP:  Bone density: SOLIS Jan 2013, T - 1.7 R fem neck  Lipid panel:  No Known Allergies  Current Outpatient Prescriptions  Medication Sig Dispense Refill  . acetaminophen (TYLENOL) 500 MG tablet Take 1,000 mg by mouth every 6 (six) hours as needed for moderate pain (pain).     Marland Kitchen  anastrozole (ARIMIDEX) 1 MG tablet Take 1 tablet by mouth  daily 90 tablet 4  . benzonatate (TESSALON) 100 MG capsule Take 1 capsule (100 mg total) by mouth 3 (three) times daily as needed for cough. 90 capsule 3  . Calcium Carbonate-Vitamin D (CALCIUM-VITAMIN D) 500-200 MG-UNIT per tablet Take 1 tablet by mouth daily.     . cephALEXin (KEFLEX) 250 MG capsule Take 1 capsule by mouth at bedtime.     . cetirizine (ZYRTEC) 10 MG tablet Take 10 mg by mouth at bedtime as needed for allergies or rhinitis.     . colestipol (COLESTID) 1 G tablet Take 1 g by mouth at bedtime.    Marland Kitchen  furosemide (LASIX) 40 MG tablet Take 1 tablet by mouth  every morning 90 tablet 3  . levETIRAcetam (KEPPRA) 500 MG tablet Take 250 mg by mouth every 12 (twelve) hours.    . Multiple Vitamin (MULTIVITAMIN WITH MINERALS) TABS tablet Take 1 tablet by mouth daily.    . NONFORMULARY OR COMPOUNDED ITEM Allergy Vaccine 1:10 Given at [facility]    . pantoprazole (PROTONIX) 40 MG tablet Take 1 tablet by mouth daily.    . potassium chloride SA (K-DUR,KLOR-CON) 20 MEQ tablet Take 1 tablet by mouth  daily 90 tablet 3  . pravastatin (PRAVACHOL) 80 MG tablet Take 1 tablet by mouth  daily 90 tablet 3  . Probiotic Product (ALIGN) 4 MG CAPS Take 4 mg by mouth daily. 30 capsule 0  . propranolol (INDERAL) 60 MG tablet Take one-half tablet by  mouth twice a day 90 tablet 3  . warfarin (COUMADIN) 2.5 MG tablet Take as directed by  Coumadin Clinic 90 tablet 0   No current facility-administered medications for this visit.    OBJECTIVE: Elderly white woman who appears stated age  81 Vitals:   01/13/15 1409  BP: 120/60  Pulse: 72  Temp: 97.5 F (36.4 C)  Resp: 18     Body mass index is 26.76 kg/(m^2).    ECOG FS: 3  Sclerae unicteric, EOMs intact Oropharynx clear, no thrush or other lesions No cervical or supraclavicular adenopathy Lungs no rales or rhonchi Heart regular rate and rhythm Abd soft, nontender, positive bowel  sounds MSK no focal spinal tenderness, no upper extremity lymphedema Neuro: nonfocal, well oriented, appropriate affect Breasts: Deferred  LAB RESULTS: Lab Results  Component Value Date   WBC 5.9 01/06/2015   NEUTROABS 3.3 01/06/2015   HGB 13.2 01/06/2015   HCT 38.6 01/06/2015   MCV 88.3 01/06/2015   PLT 171 01/06/2015      Chemistry      Component Value Date/Time   NA 142 01/06/2015 1114   NA 138 06/24/2014 1557   K 3.8 01/06/2015 1114   K 3.9 06/24/2014 1557   CL 99 06/24/2014 1557   CL 104 03/23/2012 1329   CO2 24 01/06/2015 1114   CO2 32 06/24/2014 1557   BUN 19.0 01/06/2015 1114   BUN 20 06/24/2014 1557   CREATININE 1.0 01/06/2015 1114   CREATININE 0.90 06/24/2014 1557      Component Value Date/Time   CALCIUM 10.1 01/06/2015 1114   CALCIUM 10.1 06/24/2014 1557   ALKPHOS 39* 01/06/2015 1114   ALKPHOS 40 07/22/2014 0921   AST 19 01/06/2015 1114   AST 19 07/22/2014 0921   ALT 11 01/06/2015 1114   ALT 17 07/22/2014 0921   BILITOT 0.42 01/06/2015 1114   BILITOT 0.5 07/22/2014 0921       Lab Results  Component Value Date   LABCA2 15 12/30/2010    No components found for: FBPZW258  No results for input(s): INR in the last 168 hours.  Urinalysis    Component Value Date/Time   COLORURINE YELLOW 12/14/2013 [place] 12/14/2013 1608   LABSPEC 1.004* 12/14/2013 1608   PHURINE 6.5 12/14/2013 1608   GLUCOSEU NEGATIVE 12/14/2013 1608   HGBUR NEGATIVE 12/14/2013 1608   BILIRUBINUR NEGATIVE 12/14/2013 1608   KETONESUR NEGATIVE 12/14/2013 1608   PROTEINUR NEGATIVE 12/14/2013 1608   UROBILINOGEN 0.2 12/14/2013 1608   NITRITE NEGATIVE 12/14/2013 1608   LEUKOCYTESUR NEGATIVE 12/14/2013 1608    STUDIES: No results found.   ASSESSMENT: 81 y.o.  [place] woman status post simple mastectomy 01/15/2011 for a T2 NX (stage II) invasive ductal carcinoma, grade 3, 100% estrogen receptor positive, with a borderline MIB-1, progesterone receptor and  HER-2 negative.  (1) not a candidate for tamoxifen due to cardiac issues and decided against aromatase inhibitors because of osteopenia present at baseline and concerns re. bisphosphonates  (2) local recurrence to the right chest wall documented by biopsy 01-2013 showing an invasive ductal carcinoma, grade 1 or 2, estrogen receptor strongly positive, progesterone receptor and HER-2 negative, with an MIB-1 of 28%.  (3) excision of the right chest wall mass 01/19/2013 with negative but close margins (the deep margin is skeletal muscle).  (4) status post radiation therapy to the right chest wall completed 04/23/2013  (5) Ct chest January 2015 shows small bilateral lung lesions many of which are longstanding; repeat chest CT November 2015 showed no change  (6) anastrozole started January 2015; osteopenia per bone density January 2013 with T -1.5 as lowest score   PLAN: From a breast cancer point of view, Shalandra is doing remarkably well, now nearly 2 years into her anastrozole treatment. The plan will be to continue that for a minimum of 5 years, or until there is evidence of disease progression.  We reviewed the results of the recent CT of the chest obtained in November, which show completely stable lung nodules, indicating they are benign. Her aneurysm is being followed by cardiology.  The main concern with anastrozole of course is osteopenia, and I am setting her up for a bone density scan at [facility] next month. If there is significant bone loss we can consider prolia or a similar agent.  Ariana is having significant pain because of her prior surgery and recent change in her pacemaker. All she has this Tylenol. I went ahead and added tramadol, which will not interfere with her warfarin. I did warn her that it could constipate her. Occasionally it can cause confusion. She will use it only if the Tylenol is not effective.  She is having very frequent headaches although they don't seem to be more  intense than before. Nevertheless it may be prudent to obtain a head CT scan and I have set that up for next week. We are not able to obtain an MRI of course because of her pacemaker. I will call her with those results  Otherwise she will return to see [place] in a year. She knows to call for any problems that may develop before that visit.     Chauncey Cruel, MD   01/13/2015

## 2015-01-13 NOTE — Telephone Encounter (Signed)
Appointments made and avs printed for patient,solis dexa 1 10 17  1:30 and order faxed

## 2015-01-16 ENCOUNTER — Ambulatory Visit (INDEPENDENT_AMBULATORY_CARE_PROVIDER_SITE_OTHER): Payer: Medicare Other | Admitting: *Deleted

## 2015-01-16 ENCOUNTER — Ambulatory Visit (INDEPENDENT_AMBULATORY_CARE_PROVIDER_SITE_OTHER): Payer: Medicare Other

## 2015-01-16 DIAGNOSIS — I4891 Unspecified atrial fibrillation: Secondary | ICD-10-CM

## 2015-01-16 DIAGNOSIS — J309 Allergic rhinitis, unspecified: Secondary | ICD-10-CM | POA: Diagnosis not present

## 2015-01-16 DIAGNOSIS — I6789 Other cerebrovascular disease: Secondary | ICD-10-CM | POA: Diagnosis not present

## 2015-01-16 LAB — POCT INR: INR: 2.4

## 2015-01-21 ENCOUNTER — Ambulatory Visit (HOSPITAL_COMMUNITY)
Admission: RE | Admit: 2015-01-21 | Discharge: 2015-01-21 | Disposition: A | Discharge status: Home / Self Care | Payer: Medicare Other | Source: Ambulatory Visit | Attending: Oncology | Admitting: Oncology

## 2015-01-21 ENCOUNTER — Other Ambulatory Visit: Payer: Self-pay | Admitting: Oncology

## 2015-01-21 ENCOUNTER — Encounter (HOSPITAL_COMMUNITY): Payer: Self-pay

## 2015-01-21 DIAGNOSIS — C50911 Malignant neoplasm of unspecified site of right female breast: Secondary | ICD-10-CM

## 2015-01-21 DIAGNOSIS — Z9221 Personal history of antineoplastic chemotherapy: Secondary | ICD-10-CM | POA: Insufficient documentation

## 2015-01-21 DIAGNOSIS — I739 Peripheral vascular disease, unspecified: Secondary | ICD-10-CM | POA: Insufficient documentation

## 2015-01-21 DIAGNOSIS — C7989 Secondary malignant neoplasm of other specified sites: Secondary | ICD-10-CM

## 2015-01-21 DIAGNOSIS — C50211 Malignant neoplasm of upper-inner quadrant of right female breast: Secondary | ICD-10-CM | POA: Diagnosis not present

## 2015-01-21 DIAGNOSIS — R51 Headache: Secondary | ICD-10-CM | POA: Diagnosis present

## 2015-01-21 MED ORDER — IOHEXOL 300 MG/ML  SOLN
75.0000 mL | Freq: Once | INTRAMUSCULAR | Status: AC | PRN
  Administered 2015-01-21: 75 mL via INTRAVENOUS

## 2015-01-23 ENCOUNTER — Ambulatory Visit (INDEPENDENT_AMBULATORY_CARE_PROVIDER_SITE_OTHER): Payer: Medicare Other

## 2015-01-23 ENCOUNTER — Encounter: Payer: Self-pay | Admitting: Oncology

## 2015-01-23 DIAGNOSIS — J309 Allergic rhinitis, unspecified: Secondary | ICD-10-CM | POA: Diagnosis not present

## 2015-01-29 ENCOUNTER — Other Ambulatory Visit: Payer: Self-pay | Admitting: *Deleted

## 2015-01-30 ENCOUNTER — Ambulatory Visit: Payer: Medicare Other

## 2015-02-04 ENCOUNTER — Telehealth: Payer: Self-pay | Admitting: Cardiology

## 2015-02-04 ENCOUNTER — Encounter: Payer: Medicare Other | Admitting: *Deleted

## 2015-02-04 NOTE — Telephone Encounter (Signed)
Spoke with pt and reminded pt of remote transmission that is due today. Pt verbalized understanding.   

## 2015-02-06 ENCOUNTER — Ambulatory Visit (INDEPENDENT_AMBULATORY_CARE_PROVIDER_SITE_OTHER): Payer: Medicare Other

## 2015-02-06 DIAGNOSIS — J309 Allergic rhinitis, unspecified: Secondary | ICD-10-CM

## 2015-02-07 ENCOUNTER — Encounter: Payer: Self-pay | Admitting: Cardiology

## 2015-02-11 ENCOUNTER — Encounter: Payer: Self-pay | Admitting: Cardiology

## 2015-02-11 ENCOUNTER — Telehealth: Payer: Self-pay | Admitting: Cardiology

## 2015-02-11 ENCOUNTER — Other Ambulatory Visit: Payer: Self-pay | Admitting: Interventional Cardiology

## 2015-02-11 NOTE — Telephone Encounter (Signed)
Spoke w/ pt daughter and she informed me that she had ordered pt a new home monitor back in November b/c tech services informed her that pt home monitor was no longer working. Pt still has not received home monitor. Pt daughter agreed to an appt on 03-20-15 at 2:00 to come in and get PPM interrogated. If pt receives monitor b/w now and then she will call and reschedule appt to a remote appt. Pt daughter is going to call tech services to see what the status of the delivery of the monitor is. Pt daughter agreed to appt.

## 2015-02-13 ENCOUNTER — Ambulatory Visit: Payer: Medicare Other

## 2015-02-13 ENCOUNTER — Ambulatory Visit (INDEPENDENT_AMBULATORY_CARE_PROVIDER_SITE_OTHER): Payer: Medicare Other

## 2015-02-13 DIAGNOSIS — J309 Allergic rhinitis, unspecified: Secondary | ICD-10-CM

## 2015-02-18 ENCOUNTER — Encounter: Payer: Self-pay | Admitting: Oncology

## 2015-02-20 ENCOUNTER — Ambulatory Visit (INDEPENDENT_AMBULATORY_CARE_PROVIDER_SITE_OTHER): Payer: Medicare Other

## 2015-02-20 DIAGNOSIS — J309 Allergic rhinitis, unspecified: Secondary | ICD-10-CM | POA: Diagnosis not present

## 2015-02-24 ENCOUNTER — Encounter: Payer: Self-pay | Admitting: Oncology

## 2015-02-27 ENCOUNTER — Ambulatory Visit (INDEPENDENT_AMBULATORY_CARE_PROVIDER_SITE_OTHER): Payer: Medicare Other | Admitting: *Deleted

## 2015-02-27 ENCOUNTER — Ambulatory Visit (INDEPENDENT_AMBULATORY_CARE_PROVIDER_SITE_OTHER): Payer: Medicare Other

## 2015-02-27 ENCOUNTER — Ambulatory Visit: Payer: Medicare Other

## 2015-02-27 DIAGNOSIS — J309 Allergic rhinitis, unspecified: Secondary | ICD-10-CM | POA: Diagnosis not present

## 2015-02-27 DIAGNOSIS — I4891 Unspecified atrial fibrillation: Secondary | ICD-10-CM

## 2015-02-27 DIAGNOSIS — I6789 Other cerebrovascular disease: Secondary | ICD-10-CM

## 2015-02-27 LAB — POCT INR: INR: 2.7

## 2015-03-03 ENCOUNTER — Telehealth: Payer: Self-pay | Admitting: *Deleted

## 2015-03-03 NOTE — Telephone Encounter (Signed)
Daughter called.  "Mom saw PCP Dr. Radene Ou who has seen the Bone Density report Dr. Jana Hakim ordered.  Mom has osteopeporosis.  Dr. Zadie Rhine prescribed Fosamax 70 mg but Dr. Jana Hakim ordered the test.  Would like to know if he concurs or if there's something else he would order."  Has also sent MyChart request in reference to this.

## 2015-03-06 ENCOUNTER — Ambulatory Visit (INDEPENDENT_AMBULATORY_CARE_PROVIDER_SITE_OTHER): Payer: Medicare Other

## 2015-03-06 DIAGNOSIS — J309 Allergic rhinitis, unspecified: Secondary | ICD-10-CM | POA: Diagnosis not present

## 2015-03-13 ENCOUNTER — Ambulatory Visit (INDEPENDENT_AMBULATORY_CARE_PROVIDER_SITE_OTHER): Payer: Medicare Other

## 2015-03-13 DIAGNOSIS — J309 Allergic rhinitis, unspecified: Secondary | ICD-10-CM | POA: Diagnosis not present

## 2015-03-19 NOTE — Progress Notes (Signed)
Patient ID: Debra Lynn, female   DOB: Nov 16, 1928, 82 y.o.   MRN: [id number]     Cardiology Office Note   Date:  03/21/2015   ID:  Debra Lynn, DOB 30-Aug-1928, MRN [id number]  PCP:  Aretta Nip, MD    No chief complaint on file. f/u AFib   Wt Readings from Last 3 Encounters:  03/20/15 151 lb (68.493 kg)  01/13/15 151 lb (68.493 kg)  11/28/14 148 lb (67.132 kg)       History of Present Illness: Debra Lynn is a 82 y.o. female  with pacemaker and atrial fibrillation and prior CVA. She reports unsteadiness and lightheadedness as well. She saw her neurologist who felt that her neuropathy was worse. She used to have this issue with standing. Now it is more frequent and not always dependent on change in positions. Lying down gives relief. SHe is walking with a walker, now most of the time. Denies : Chest pain.   Orthopnea.  Palpitations.  Syncope.   Still SHOB with short walks even with walker.  She has some right sided chest pain at the site of her prior surgery. No falls recently.  No bleeding problems.   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  Doing well from that standpoint.  Chest CT showed stable lung nodules.  Patient's daughter states she is eating well.      Past Medical History  Diagnosis Date  . Complete heart block (HCC)     a. s/p STJ CRTP  . Glaucoma   . Seasonal allergic rhinitis     Pos Skin Test 07-05-07  . Atrial fibrillation ([place])     a. s/p AVN ablation  . Hypertension   . Hyperlipidemia   . Arthritis    . Pericarditis        . Seizures ([place]) 2013       . Parkinsonism ([place])   . Compression fracture         . Orthostatic hypotension   . CVA 05/07/2008       . Cancer [facility]) 12/2010    breast with recurrence s/p recent XRT  . Thoracic aneurysm without mention of rupture   . 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 Fibrocystic breast   . Overactive bladder   . GERD (gastroesophageal reflux disease)       Past Surgical History  Procedure Laterality Date  . Cholecystectomy    . Vesicovaginal fistula closure w/ tah    . Ablation  1980s    Failed SVT ablation  . Flexible bronchoscopy w/ upper endoscopy      and swallowing evaluation  . Pacemaker insertion    . Knee arthroscopy Right     Arthroscopic for torn meniscus  . Cataract extraction Bilateral   . Abdominal hysterectomy  1975    pt. unsure if laparascopic/vaginal/abdominal  . Breast surgery Right 01/15/11    mastectomy  . Bi-ventricular pacemaker upgrade  01/2007    3rd device, upgraded to SJM Frontier II BiV pacemaker by Dr Leonia Reeves  . Mass excision Right 01/19/2013    Procedure: EXCISION CHEST WALL MASS;  Surgeon: [name], MD;  Location: [facility];  Service: General;  Laterality: Right;  . Eye surgery      For glaucoma  . Appendectomy    . Ep implantable device N/A 06/27/2014    CRT-P  (SJM) gen change by Dr Rayann Heman     Current Outpatient Prescriptions  Medication  Sig Dispense Refill  . acetaminophen (TYLENOL) 500 MG tablet Take 1,000 mg by mouth every 6 (six) hours as needed for moderate pain (pain).     Marland Kitchen alendronate (FOSAMAX) 70 MG tablet Take 70 mg by mouth once a week. Take with a full glass of water on an empty stomach.    Marland Kitchen anastrozole (ARIMIDEX) 1 MG tablet Take 1 tablet by mouth  daily 90 tablet 4  . benzonatate (TESSALON) 100 MG capsule Take 1 capsule (100 mg total) by mouth 3 (three) times daily as needed for cough. 90 capsule 3  . Calcium Carbonate-Vitamin D (CALCIUM-VITAMIN D) 500-200 MG-UNIT per tablet Take 1 tablet by mouth daily.     . cephALEXin (KEFLEX) 250 MG capsule Take 1 capsule by mouth at bedtime.     . cetirizine (ZYRTEC) 10 MG tablet Take 10 mg by mouth at bedtime as needed for allergies or rhinitis.     . colestipol (COLESTID) 1 G tablet Take 1 g by mouth at bedtime.    . furosemide (LASIX) 40 MG tablet Take 1 tablet by mouth  every morning 90 tablet 3  . levETIRAcetam (KEPPRA) 500 MG  tablet Take 250 mg by mouth every 12 (twelve) hours.    . Multiple Vitamin (MULTIVITAMIN WITH MINERALS) TABS tablet Take 1 tablet by mouth daily.    . pantoprazole (PROTONIX) 40 MG tablet Take 1 tablet by mouth daily.    . potassium chloride SA (K-DUR,KLOR-CON) 20 MEQ tablet Take 1 tablet by mouth  daily 90 tablet 3  . pravastatin (PRAVACHOL) 80 MG tablet Take 1 tablet by mouth  daily 90 tablet 3  . Probiotic Product (ALIGN) 4 MG CAPS Take 4 mg by mouth daily. 30 capsule 0  . propranolol (INDERAL) 60 MG tablet Take one-half tablet by  mouth twice a day 90 tablet 3  . traMADol (ULTRAM) 50 MG tablet Take 1 tablet (50 mg total) by mouth every 6 (six) hours as needed. 30 tablet 2  . warfarin (COUMADIN) 2.5 MG tablet Take as directed by  Coumadin Clinic 90 tablet 1   No current facility-administered medications for this visit.    Allergies:   Review of patient's allergies indicates no known allergies.    Social History:  The patient  reports that she has never smoked. She has never used smokeless tobacco. She reports that she does not drink alcohol or use illicit drugs.   Family History:  The patient's family history includes Asthma in her brother and daughter; Cancer in her father; Diabetes in her brother; Heart attack in her brother; Heart failure in her mother; Hypertension in her brother; Lymphoma in her brother.    ROS:  Please see the history of present illness.   Otherwise, review of systems are positive for lightheadedness.   All other systems are reviewed and negative.    PHYSICAL EXAM: VS:  BP 130/80 mmHg  Pulse 64  Ht 5\' 3"  (1.6 m)  Wt 151 lb (68.493 kg)  BMI 26.76 kg/m2 , BMI Body mass index is 26.76 kg/(m^2). GEN: Well nourished, well developed, in no acute distress HEENT: normal Neck: no JVD, carotid bruits, or masses Cardiac: RRR; no murmurs, rubs, or gallops,no edema  Respiratory:  clear to auscultation bilaterally, normal work of breathing GI: soft, nontender,  nondistended, + BS MS: no deformity or atrophy Skin: warm and dry, no rash Neuro:  Strength and sensation are intact Psych: euthymic mood, full affect   EKG:   The ekg ordered today demonstrates  atrial sensed ventricular paced rhythm   Recent Labs: 01/06/2015: ALT 11; BUN 19.0; Creatinine 1.0; HGB 13.2; Platelets 171; Potassium 3.8; Sodium 142   Lipid Panel    Component Value Date/Time   CHOL 186 07/22/2014 0921   TRIG 274.0* 07/22/2014 0921   HDL 59.00 07/22/2014 0921   CHOLHDL 3 07/22/2014 0921   VLDL 54.8* 07/22/2014 0921   LDLCALC 97 06/21/2013 1001   LDLDIRECT 84.0 07/22/2014 0921     Other studies Reviewed: Additional studies/ records that were reviewed today with results demonstrating: LDL 88 in January 2017. Prior cath showed no signficant CAD. Report reviewed.  ASSESSMENT AND PLAN:  1. Lightheadedness: Likely multifactorial. She has a neurologic issue causing tremor. Encouraged her to be careful when changing positions. She should always use her walker. We talked about the importance of avoiding falls given that she is on warfarin. When she was not on warfarin in the past, she did have a CVA. It is very important for her to stay on anticoagulation given her cardiomyopathy.  2. Atrial fibrillation: Continue Coumadin for stroke prevention. Pacemaker in place as well. 3.  Chest pain: I think this is more related to her prior breast cancer surgery.  Clean cath in 2008.  No further ischemia w/u at this time.  Hyperlipidemia: well controlled. HTN: well controlled.  Continue current meds.  Current medicines are reviewed at length with the patient today.  The patient concerns regarding her medicines were addressed.  The following changes have been made:  No change  Labs/ tests ordered today include:   Orders Placed This Encounter  Procedures  . EKG 12-Lead    Recommend 150 minutes/week of aerobic exercise Low fat, low carb, high fiber diet recommended  Disposition:    FU in 1 year   Teresita Madura., MD  03/21/2015 8:48 AM    [facility] [address], [place], [place]  [address] Phone: [phone]; Fax: [phone]

## 2015-03-20 ENCOUNTER — Ambulatory Visit (INDEPENDENT_AMBULATORY_CARE_PROVIDER_SITE_OTHER): Payer: Medicare Other

## 2015-03-20 ENCOUNTER — Ambulatory Visit: Payer: Medicare Other

## 2015-03-20 ENCOUNTER — Encounter: Payer: Self-pay | Admitting: Interventional Cardiology

## 2015-03-20 ENCOUNTER — Telehealth: Payer: Self-pay | Admitting: *Deleted

## 2015-03-20 ENCOUNTER — Ambulatory Visit (INDEPENDENT_AMBULATORY_CARE_PROVIDER_SITE_OTHER): Payer: Medicare Other | Admitting: Interventional Cardiology

## 2015-03-20 ENCOUNTER — Ambulatory Visit (INDEPENDENT_AMBULATORY_CARE_PROVIDER_SITE_OTHER): Payer: Medicare Other | Admitting: *Deleted

## 2015-03-20 ENCOUNTER — Encounter: Payer: Self-pay | Admitting: Internal Medicine

## 2015-03-20 VITALS — BP 130/80 | HR 64 | Ht 63.0 in | Wt 151.0 lb

## 2015-03-20 DIAGNOSIS — R0782 Intercostal pain: Secondary | ICD-10-CM

## 2015-03-20 DIAGNOSIS — E785 Hyperlipidemia, unspecified: Secondary | ICD-10-CM

## 2015-03-20 DIAGNOSIS — J309 Allergic rhinitis, unspecified: Secondary | ICD-10-CM

## 2015-03-20 DIAGNOSIS — Z950 Presence of cardiac pacemaker: Secondary | ICD-10-CM

## 2015-03-20 DIAGNOSIS — I480 Paroxysmal atrial fibrillation: Secondary | ICD-10-CM | POA: Diagnosis not present

## 2015-03-20 DIAGNOSIS — I10 Essential (primary) hypertension: Secondary | ICD-10-CM

## 2015-03-20 DIAGNOSIS — I442 Atrioventricular block, complete: Secondary | ICD-10-CM | POA: Diagnosis not present

## 2015-03-20 LAB — CUP PACEART INCLINIC DEVICE CHECK
Battery Remaining Longevity: 75.6
Battery Voltage: 2.98 V
Brady Statistic RA Percent Paced: 33 %
Brady Statistic RV Percent Paced: 99.67 %
Date Time Interrogation Session: 20170216185245
Implantable Lead Implant Date: 20081223
Implantable Lead Implant Date: 20081223
Implantable Lead Implant Date: 20081223
Implantable Lead Location: 753858
Implantable Lead Location: 753859
Implantable Lead Location: 753860
Implantable Lead Model: 5024
Implantable Lead Model: 5524
Lead Channel Impedance Value: 1000 Ohm
Lead Channel Impedance Value: 587.5 Ohm
Lead Channel Impedance Value: 987.5 Ohm
Lead Channel Pacing Threshold Amplitude: 1.5 V
Lead Channel Pacing Threshold Amplitude: 1.625 V
Lead Channel Pacing Threshold Amplitude: 1.875 V
Lead Channel Pacing Threshold Pulse Width: 0.5 ms
Lead Channel Pacing Threshold Pulse Width: 0.7 ms
Lead Channel Pacing Threshold Pulse Width: 0.8 ms
Lead Channel Sensing Intrinsic Amplitude: 10 mV
Lead Channel Sensing Intrinsic Amplitude: 2.6 mV
Lead Channel Setting Pacing Amplitude: 2.5 V
Lead Channel Setting Pacing Amplitude: 2.875
Lead Channel Setting Pacing Amplitude: 3.125
Lead Channel Setting Pacing Pulse Width: 0.7 ms
Lead Channel Setting Pacing Pulse Width: 0.8 ms
Lead Channel Setting Sensing Sensitivity: 4.5 mV
Pulse Gen Model: 3222
Pulse Gen Serial Number: 7738731

## 2015-03-20 NOTE — Progress Notes (Signed)
CRT-P device check in clinic. Normal device function. Thresholds, sensing, impedance consistent with previous measurements. Histograms appropriate for patient and level of activity. 41 AMS episodes (5.6%), EGMs suggest AT and AF, +warfarin, longest >9 days, peak A 640bpm, peak V 116bpm. No ventricular high rate episodes. Patient bi-ventricularly pacing >99% of the time. Device programmed with appropriate safety margins. Device heart failure diagnostics are within normal limits and stable over time. Estimated longevity 6.1-6.6 years. Patient received new Merlin transmitter, daughter will help her set it up. Merlin on 06/19/15 and ROV with AS in 09/2015.

## 2015-03-20 NOTE — Patient Instructions (Signed)

## 2015-03-20 NOTE — Telephone Encounter (Signed)
Daughter calling about check today showing 7 days of AFib (she thinks). She wanted to make sure that a physician will review the results- I assured her they would. She is also concerned how A fib could last that Zylpha Poynor- I explained that patients have different experiences with AFib and that some people have AFib all of the time- I assured her that if she is asymptomatic and remains on warfarin therapy/monitoring that she is on the proper regimen for risk of stroke. She verbalizes understanding.

## 2015-03-25 ENCOUNTER — Encounter: Payer: Self-pay | Admitting: Interventional Cardiology

## 2015-03-26 ENCOUNTER — Encounter: Payer: Self-pay | Admitting: Internal Medicine

## 2015-03-27 ENCOUNTER — Ambulatory Visit (INDEPENDENT_AMBULATORY_CARE_PROVIDER_SITE_OTHER): Payer: Medicare Other

## 2015-03-27 DIAGNOSIS — J309 Allergic rhinitis, unspecified: Secondary | ICD-10-CM

## 2015-04-03 ENCOUNTER — Ambulatory Visit (INDEPENDENT_AMBULATORY_CARE_PROVIDER_SITE_OTHER): Payer: Medicare Other | Admitting: *Deleted

## 2015-04-03 DIAGNOSIS — J309 Allergic rhinitis, unspecified: Secondary | ICD-10-CM | POA: Diagnosis not present

## 2015-04-10 ENCOUNTER — Ambulatory Visit (INDEPENDENT_AMBULATORY_CARE_PROVIDER_SITE_OTHER): Payer: Medicare Other | Admitting: *Deleted

## 2015-04-10 ENCOUNTER — Ambulatory Visit (INDEPENDENT_AMBULATORY_CARE_PROVIDER_SITE_OTHER): Payer: Medicare Other

## 2015-04-10 DIAGNOSIS — J309 Allergic rhinitis, unspecified: Secondary | ICD-10-CM

## 2015-04-10 DIAGNOSIS — I4891 Unspecified atrial fibrillation: Secondary | ICD-10-CM | POA: Diagnosis not present

## 2015-04-10 DIAGNOSIS — I6789 Other cerebrovascular disease: Secondary | ICD-10-CM | POA: Diagnosis not present

## 2015-04-10 LAB — POCT INR: INR: 2.4

## 2015-04-17 ENCOUNTER — Ambulatory Visit (INDEPENDENT_AMBULATORY_CARE_PROVIDER_SITE_OTHER): Payer: Medicare Other | Admitting: *Deleted

## 2015-04-17 DIAGNOSIS — J309 Allergic rhinitis, unspecified: Secondary | ICD-10-CM | POA: Diagnosis not present

## 2015-04-24 ENCOUNTER — Ambulatory Visit (INDEPENDENT_AMBULATORY_CARE_PROVIDER_SITE_OTHER): Payer: Medicare Other

## 2015-04-24 DIAGNOSIS — J309 Allergic rhinitis, unspecified: Secondary | ICD-10-CM

## 2015-04-25 ENCOUNTER — Other Ambulatory Visit: Payer: Self-pay | Admitting: Interventional Cardiology

## 2015-05-01 ENCOUNTER — Other Ambulatory Visit: Payer: Self-pay | Admitting: Internal Medicine

## 2015-05-01 ENCOUNTER — Ambulatory Visit (INDEPENDENT_AMBULATORY_CARE_PROVIDER_SITE_OTHER): Payer: Medicare Other | Admitting: *Deleted

## 2015-05-01 ENCOUNTER — Telehealth: Payer: Self-pay | Admitting: Internal Medicine

## 2015-05-01 DIAGNOSIS — J309 Allergic rhinitis, unspecified: Secondary | ICD-10-CM

## 2015-05-01 NOTE — Telephone Encounter (Signed)
LMOMTCB x1 for pt 

## 2015-05-02 NOTE — Telephone Encounter (Signed)
CY please advise on refill. Thanks. 

## 2015-05-02 NOTE — Telephone Encounter (Signed)
RX for benzonate was sent in today to [facility] by nurse. Called made pt aware. Nothing further needed

## 2015-05-02 NOTE — Telephone Encounter (Signed)
Ok to refill, ref x 5

## 2015-05-08 ENCOUNTER — Ambulatory Visit (INDEPENDENT_AMBULATORY_CARE_PROVIDER_SITE_OTHER): Payer: Medicare Other | Admitting: *Deleted

## 2015-05-08 ENCOUNTER — Ambulatory Visit: Payer: Medicare Other

## 2015-05-08 DIAGNOSIS — J309 Allergic rhinitis, unspecified: Secondary | ICD-10-CM

## 2015-05-14 DIAGNOSIS — J309 Allergic rhinitis, unspecified: Secondary | ICD-10-CM | POA: Diagnosis not present

## 2015-05-15 ENCOUNTER — Ambulatory Visit (INDEPENDENT_AMBULATORY_CARE_PROVIDER_SITE_OTHER): Payer: Medicare Other | Admitting: *Deleted

## 2015-05-15 DIAGNOSIS — J309 Allergic rhinitis, unspecified: Secondary | ICD-10-CM

## 2015-05-22 ENCOUNTER — Ambulatory Visit (INDEPENDENT_AMBULATORY_CARE_PROVIDER_SITE_OTHER): Payer: Medicare Other | Admitting: *Deleted

## 2015-05-22 DIAGNOSIS — J309 Allergic rhinitis, unspecified: Secondary | ICD-10-CM | POA: Diagnosis not present

## 2015-05-22 DIAGNOSIS — I4891 Unspecified atrial fibrillation: Secondary | ICD-10-CM

## 2015-05-22 DIAGNOSIS — I6789 Other cerebrovascular disease: Secondary | ICD-10-CM | POA: Diagnosis not present

## 2015-05-22 LAB — POCT INR: INR: 2.6

## 2015-06-05 ENCOUNTER — Ambulatory Visit (INDEPENDENT_AMBULATORY_CARE_PROVIDER_SITE_OTHER): Payer: Medicare Other

## 2015-06-05 DIAGNOSIS — J309 Allergic rhinitis, unspecified: Secondary | ICD-10-CM | POA: Diagnosis not present

## 2015-06-12 ENCOUNTER — Ambulatory Visit (INDEPENDENT_AMBULATORY_CARE_PROVIDER_SITE_OTHER): Payer: Medicare Other | Admitting: *Deleted

## 2015-06-12 DIAGNOSIS — J309 Allergic rhinitis, unspecified: Secondary | ICD-10-CM | POA: Diagnosis not present

## 2015-06-19 ENCOUNTER — Encounter: Payer: Self-pay | Admitting: Internal Medicine

## 2015-06-19 ENCOUNTER — Ambulatory Visit (INDEPENDENT_AMBULATORY_CARE_PROVIDER_SITE_OTHER): Payer: Medicare Other

## 2015-06-19 ENCOUNTER — Encounter: Payer: Medicare Other | Admitting: *Deleted

## 2015-06-19 DIAGNOSIS — J309 Allergic rhinitis, unspecified: Secondary | ICD-10-CM

## 2015-06-20 ENCOUNTER — Encounter: Payer: Self-pay | Admitting: Cardiology

## 2015-06-23 ENCOUNTER — Telehealth: Payer: Self-pay | Admitting: Internal Medicine

## 2015-06-23 ENCOUNTER — Other Ambulatory Visit: Payer: Self-pay | Admitting: Interventional Cardiology

## 2015-06-23 ENCOUNTER — Ambulatory Visit (INDEPENDENT_AMBULATORY_CARE_PROVIDER_SITE_OTHER): Payer: Medicare Other | Admitting: *Deleted

## 2015-06-23 DIAGNOSIS — I442 Atrioventricular block, complete: Secondary | ICD-10-CM | POA: Diagnosis not present

## 2015-06-23 NOTE — Telephone Encounter (Signed)
Returned patient's daughter's call.  Advised that [employer] should work automatically, but that it occasionally does not.  Advised that patient can send a manual transmission today and we will process it.  Patient's daughter verbalizes understanding.  She requests that I call the patient in 5-10 minutes to assist her in sending a transmission.  She is appreciative of explanation and denies additional questions at this time.

## 2015-06-23 NOTE — Telephone Encounter (Signed)
°  1. Has your device fired? No   2. Is you device beeping? No   3. Are you experiencing draining or swelling at device site? no  4. Are you calling to see if we received your device transmission? Yes   5. Have you passed out? No   Pt's daughter stated they received a letter and thought the device transmit by itself at night. Please advise

## 2015-06-23 NOTE — Telephone Encounter (Signed)
Called patient at [phone] per daughter's request.  [facility] patient through transmission process and advised that I would call her back when her transmission is received.  Patient verbalizes understanding and appreciation.  Transmission received.  Patient made aware that report shows no alerts or abnormalities.  She is appreciative of assistance and denies additional questions or concerns at this time.

## 2015-06-24 NOTE — Progress Notes (Signed)
Remote pacemaker transmission.   

## 2015-06-26 ENCOUNTER — Telehealth: Payer: Self-pay | Admitting: Internal Medicine

## 2015-06-26 ENCOUNTER — Ambulatory Visit (INDEPENDENT_AMBULATORY_CARE_PROVIDER_SITE_OTHER): Payer: Medicare Other | Admitting: *Deleted

## 2015-06-26 DIAGNOSIS — J309 Allergic rhinitis, unspecified: Secondary | ICD-10-CM | POA: Diagnosis not present

## 2015-06-26 NOTE — Telephone Encounter (Signed)
Ok to get allergy shots every other week as requested

## 2015-06-26 NOTE — Telephone Encounter (Signed)
Last ov with CY on 11/28/14 Patient Instructions     Order- schedule CT chest no contrast Dx lung nodules, hx breast Ca  We can continue allergy vaccine for another year, then consider whether to quit.   Called spoke with pt's daughter. She is requesting that her mother get her allergy shots every 2 weeks. I explained to her that I would send a message to [facility] for his recs and return her call. She voiced understanding and had no further questions.   CY please advise  No Known Allergies    Current outpatient prescriptions:  .  acetaminophen (TYLENOL) 500 MG tablet, Take 1,000 mg by mouth every 6 (six) hours as needed for moderate pain (pain). , Disp: , Rfl:  .  alendronate (FOSAMAX) 70 MG tablet, Take 70 mg by mouth once a week. Take with a full glass of water on an empty stomach., Disp: , Rfl:  .  anastrozole (ARIMIDEX) 1 MG tablet, Take 1 tablet by mouth  daily, Disp: 90 tablet, Rfl: 4 .  benzonatate (TESSALON) 100 MG capsule, TAKE 1 CAPSULE BY MOUTH 3TIMES A DAY AS NEEDED FORCOUGH, Disp: 90 capsule, Rfl: 5 .  Calcium Carbonate-Vitamin D (CALCIUM-VITAMIN D) 500-200 MG-UNIT per tablet, Take 1 tablet by mouth daily. , Disp: , Rfl:  .  cephALEXin (KEFLEX) 250 MG capsule, Take 1 capsule by mouth at bedtime. , Disp: , Rfl:  .  cetirizine (ZYRTEC) 10 MG tablet, Take 10 mg by mouth at bedtime as needed for allergies or rhinitis. , Disp: , Rfl:  .  colestipol (COLESTID) 1 G tablet, Take 1 g by mouth at bedtime., Disp: , Rfl:  .  furosemide (LASIX) 40 MG tablet, Take 1 tablet by mouth  every morning, Disp: 90 tablet, Rfl: 2 .  levETIRAcetam (KEPPRA) 500 MG tablet, Take 250 mg by mouth every 12 (twelve) hours., Disp: , Rfl:  .  Multiple Vitamin (MULTIVITAMIN WITH MINERALS) TABS tablet, Take 1 tablet by mouth daily., Disp: , Rfl:  .  pantoprazole (PROTONIX) 40 MG tablet, Take 1 tablet by mouth daily., Disp: , Rfl:  .  potassium chloride SA (K-DUR,KLOR-CON) 20 MEQ tablet, Take 1 tablet by mouth   daily, Disp: 90 tablet, Rfl: 3 .  pravastatin (PRAVACHOL) 80 MG tablet, Take 1 tablet by mouth  daily, Disp: 90 tablet, Rfl: 2 .  Probiotic Product (ALIGN) 4 MG CAPS, Take 4 mg by mouth daily., Disp: 30 capsule, Rfl: 0 .  propranolol (INDERAL) 60 MG tablet, Take one-half tablet by  mouth twice a day, Disp: 90 tablet, Rfl: 3 .  traMADol (ULTRAM) 50 MG tablet, Take 1 tablet (50 mg total) by mouth every 6 (six) hours as needed., Disp: 30 tablet, Rfl: 2 .  warfarin (COUMADIN) 2.5 MG tablet, Take as directed by  [facility], Disp: 90 tablet, Rfl: 0

## 2015-06-27 NOTE — Telephone Encounter (Signed)
Spoke with pt's daughter. She is aware that pt can have allergy shots every other week. Nothing further was needed.

## 2015-06-27 NOTE — Telephone Encounter (Signed)
Noted. I will put it in her shot rec. next time she comes in. Nothing further needed. Closing.

## 2015-07-03 ENCOUNTER — Ambulatory Visit (INDEPENDENT_AMBULATORY_CARE_PROVIDER_SITE_OTHER): Payer: Medicare Other

## 2015-07-03 ENCOUNTER — Ambulatory Visit (INDEPENDENT_AMBULATORY_CARE_PROVIDER_SITE_OTHER): Payer: Medicare Other | Admitting: *Deleted

## 2015-07-03 DIAGNOSIS — J309 Allergic rhinitis, unspecified: Secondary | ICD-10-CM | POA: Diagnosis not present

## 2015-07-03 DIAGNOSIS — I4891 Unspecified atrial fibrillation: Secondary | ICD-10-CM

## 2015-07-03 DIAGNOSIS — I6789 Other cerebrovascular disease: Secondary | ICD-10-CM | POA: Diagnosis not present

## 2015-07-03 LAB — POCT INR: INR: 2.4

## 2015-07-14 LAB — CUP PACEART REMOTE DEVICE CHECK
Battery Remaining Longevity: 76 mo
Battery Remaining Percentage: 95.5 %
Battery Voltage: 2.98 V
Brady Statistic AP VP Percent: 32 %
Brady Statistic AP VS Percent: 1 %
Brady Statistic AS VP Percent: 68 %
Brady Statistic AS VS Percent: 1 %
Brady Statistic RA Percent Paced: 31 %
Date Time Interrogation Session: 20170522141056
Implantable Lead Implant Date: 20081223
Implantable Lead Implant Date: 20081223
Implantable Lead Implant Date: 20081223
Implantable Lead Location: 753858
Implantable Lead Location: 753859
Implantable Lead Location: 753860
Implantable Lead Model: 5024
Implantable Lead Model: 5524
Lead Channel Impedance Value: 540 Ohm
Lead Channel Impedance Value: 960 Ohm
Lead Channel Impedance Value: 990 Ohm
Lead Channel Pacing Threshold Amplitude: 0.625 V
Lead Channel Pacing Threshold Amplitude: 1.375 V
Lead Channel Pacing Threshold Amplitude: 2 V
Lead Channel Pacing Threshold Pulse Width: 0.5 ms
Lead Channel Pacing Threshold Pulse Width: 0.7 ms
Lead Channel Pacing Threshold Pulse Width: 0.8 ms
Lead Channel Sensing Intrinsic Amplitude: 2.9 mV
Lead Channel Setting Pacing Amplitude: 1.625
Lead Channel Setting Pacing Amplitude: 2.375
Lead Channel Setting Pacing Amplitude: 3 V
Lead Channel Setting Pacing Pulse Width: 0.7 ms
Lead Channel Setting Pacing Pulse Width: 0.8 ms
Lead Channel Setting Sensing Sensitivity: 4.5 mV
Pulse Gen Model: 3222
Pulse Gen Serial Number: 7738731

## 2015-07-17 ENCOUNTER — Ambulatory Visit (INDEPENDENT_AMBULATORY_CARE_PROVIDER_SITE_OTHER): Payer: Medicare Other | Admitting: *Deleted

## 2015-07-17 ENCOUNTER — Encounter: Payer: Self-pay | Admitting: Cardiology

## 2015-07-17 DIAGNOSIS — J309 Allergic rhinitis, unspecified: Secondary | ICD-10-CM | POA: Diagnosis not present

## 2015-07-28 ENCOUNTER — Other Ambulatory Visit: Payer: Self-pay | Admitting: Interventional Cardiology

## 2015-07-29 ENCOUNTER — Other Ambulatory Visit: Payer: Self-pay | Admitting: *Deleted

## 2015-07-29 MED ORDER — WARFARIN SODIUM 2.5 MG PO TABS: ORAL_TABLET | ORAL | Status: DC

## 2015-07-30 ENCOUNTER — Other Ambulatory Visit: Payer: Self-pay | Admitting: Interventional Cardiology

## 2015-07-30 MED ORDER — POTASSIUM CHLORIDE CRYS ER 20 MEQ PO TBCR: EXTENDED_RELEASE_TABLET | ORAL | Status: DC

## 2015-07-31 ENCOUNTER — Ambulatory Visit (INDEPENDENT_AMBULATORY_CARE_PROVIDER_SITE_OTHER): Payer: Medicare Other | Admitting: *Deleted

## 2015-07-31 DIAGNOSIS — J309 Allergic rhinitis, unspecified: Secondary | ICD-10-CM

## 2015-08-01 ENCOUNTER — Encounter: Payer: Self-pay | Admitting: Cardiology

## 2015-08-14 ENCOUNTER — Ambulatory Visit (INDEPENDENT_AMBULATORY_CARE_PROVIDER_SITE_OTHER): Payer: Medicare Other | Admitting: *Deleted

## 2015-08-14 DIAGNOSIS — I4891 Unspecified atrial fibrillation: Secondary | ICD-10-CM

## 2015-08-14 DIAGNOSIS — I6789 Other cerebrovascular disease: Secondary | ICD-10-CM | POA: Diagnosis not present

## 2015-08-14 DIAGNOSIS — J309 Allergic rhinitis, unspecified: Secondary | ICD-10-CM

## 2015-08-14 LAB — POCT INR: INR: 2.5

## 2015-08-28 ENCOUNTER — Ambulatory Visit (INDEPENDENT_AMBULATORY_CARE_PROVIDER_SITE_OTHER): Payer: Medicare Other | Admitting: *Deleted

## 2015-08-28 ENCOUNTER — Ambulatory Visit: Payer: Medicare Other

## 2015-08-28 DIAGNOSIS — J309 Allergic rhinitis, unspecified: Secondary | ICD-10-CM

## 2015-09-11 ENCOUNTER — Ambulatory Visit (INDEPENDENT_AMBULATORY_CARE_PROVIDER_SITE_OTHER): Payer: Medicare Other | Admitting: *Deleted

## 2015-09-11 DIAGNOSIS — J309 Allergic rhinitis, unspecified: Secondary | ICD-10-CM

## 2015-09-25 ENCOUNTER — Ambulatory Visit (INDEPENDENT_AMBULATORY_CARE_PROVIDER_SITE_OTHER): Payer: Medicare Other | Admitting: *Deleted

## 2015-09-25 DIAGNOSIS — I6789 Other cerebrovascular disease: Secondary | ICD-10-CM

## 2015-09-25 DIAGNOSIS — J309 Allergic rhinitis, unspecified: Secondary | ICD-10-CM

## 2015-09-25 DIAGNOSIS — I4891 Unspecified atrial fibrillation: Secondary | ICD-10-CM | POA: Diagnosis not present

## 2015-09-25 LAB — POCT INR: INR: 2.2

## 2015-09-29 ENCOUNTER — Ambulatory Visit (INDEPENDENT_AMBULATORY_CARE_PROVIDER_SITE_OTHER): Payer: Medicare Other | Admitting: Pharmacist

## 2015-09-29 ENCOUNTER — Encounter: Payer: Self-pay | Admitting: Nurse Practitioner

## 2015-09-29 DIAGNOSIS — E785 Hyperlipidemia, unspecified: Secondary | ICD-10-CM

## 2015-09-29 NOTE — Progress Notes (Signed)
Electrophysiology Office Note Date: 10/01/2015  ID:  Debra Lynn, DOB 01-Sep-1928, MRN [id number]  PCP: Aretta Nip, MD Primary Cardiologist: Irish Lack Electrophysiologist: Allred  CC: Pacemaker follow-up  Debra Lynn is a 82 y.o. female seen today for Dr Rayann Heman.  She presents today for routine electrophysiology followup.  Since last being seen in our clinic, the patient reports that she has had a one month history of progressive shortness of breath with exertion and orthopnea.  Weights have been stable. She has some LE edema but not significant.  She denies chest pain, palpitations, PND, nausea, vomiting, dizziness, syncope, edema, weight gain, or early satiety.  Echo 08/2012 demonstrated EF 55-60%, hypokinesis of basal-mid inferior myocardium as well as overall asynchronous contraction   Device History: STJ dual chamber PPM implanted 1992 for complete heart block; gen change 2004; upgrade to STJ CRTP 2008; gen change 2016   Past Medical History:  Diagnosis Date  . Arthritis    . Atrial fibrillation ([place])    a. s/p AVN ablation  . Cancer [facility]) 12/2010   breast with recurrence s/p recent XRT  . Complete heart block ([facility])    a. s/p STJ CRTP  . Compression fracture        . CVA 05/07/2008      . Fibrocystic breast   . GERD (gastroesophageal reflux disease)   . Glaucoma   . Hyperlipidemia   . Hypertension   . Orthostatic hypotension   . Overactive bladder   . Parkinsonism ([place])   . Pericarditis       . 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 Seasonal allergic rhinitis    Pos Skin Test 07-05-07  . Seizures ([place]) 2013      . Thoracic aneurysm without mention of rupture    Past Surgical History:  Procedure Laterality Date  . ABDOMINAL HYSTERECTOMY  1975   pt. unsure if laparascopic/vaginal/abdominal  . ABLATION  1980s   AVN ablation   . APPENDECTOMY    . BI-VENTRICULAR PACEMAKER UPGRADE  01/2007   3rd device, upgraded to SJM Frontier II  BiV pacemaker by Dr Leonia Reeves  . BREAST SURGERY Right 01/15/11   mastectomy  . CATARACT EXTRACTION Bilateral   . CHOLECYSTECTOMY    . EP IMPLANTABLE DEVICE N/A 06/27/2014   CRT-P  (SJM) gen change by Dr Rayann Heman  . EYE SURGERY     For glaucoma  . FLEXIBLE BRONCHOSCOPY W/ UPPER ENDOSCOPY     and swallowing evaluation  . KNEE ARTHROSCOPY Right    Arthroscopic for torn meniscus  . MASS EXCISION Right 01/19/2013   Procedure: EXCISION CHEST WALL MASS;  Surgeon: [name], MD;  Location: [facility] ORS;  Service: General;  Laterality: Right;  . PACEMAKER INSERTION     1992; gen change 2004  . VESICOVAGINAL FISTULA CLOSURE W/ TAH      Current Outpatient Prescriptions  Medication Sig Dispense Refill  . acetaminophen (TYLENOL) 500 MG tablet Take 1,000 mg by mouth every 6 (six) hours as needed for moderate pain (pain).     Marland Kitchen anastrozole (ARIMIDEX) 1 MG tablet Take 1 tablet by mouth  daily 90 tablet 4  . benzonatate (TESSALON) 100 MG capsule TAKE 1 CAPSULE BY MOUTH 3TIMES A DAY AS NEEDED FORCOUGH 90 capsule 5  . Calcium Carbonate-Vitamin D (CALCIUM-VITAMIN D) 500-200 MG-UNIT per tablet Take 1 tablet by mouth daily.     . cephALEXin (KEFLEX) 250 MG capsule Take 1 capsule by mouth at bedtime.     Marland Kitchen  cetirizine (ZYRTEC) 10 MG tablet Take 10 mg by mouth at bedtime as needed for allergies or rhinitis.     . colestipol (COLESTID) 1 G tablet Take 1 g by mouth at bedtime.    . furosemide (LASIX) 40 MG tablet Take 1 tablet by mouth  every morning 90 tablet 2  . levETIRAcetam (KEPPRA) 500 MG tablet Take 250 mg by mouth every 12 (twelve) hours.    . Multiple Vitamin (MULTIVITAMIN WITH MINERALS) TABS tablet Take 1 tablet by mouth daily.    . Omega-3 Fatty Acids (FISH OIL) 1000 MG CAPS Take 1 capsule by mouth 2 (two) times daily.    . pantoprazole (PROTONIX) 40 MG tablet Take 1 tablet by mouth daily.    . potassium chloride SA (K-DUR,KLOR-CON) 20 MEQ tablet Take 1 tablet by mouth  daily 90 tablet 1  .  pravastatin (PRAVACHOL) 80 MG tablet Take 1 tablet by mouth  daily 90 tablet 2  . Probiotic Product (ALIGN) 4 MG CAPS Take 4 mg by mouth daily. 30 capsule 0  . propranolol (INDERAL) 60 MG tablet Take one-half tablet by  mouth twice a day 90 tablet 3  . traMADol (ULTRAM) 50 MG tablet Take 50 mg by mouth every 6 (six) hours as needed for moderate pain or severe pain.    Marland Kitchen warfarin (COUMADIN) 2.5 MG tablet Take 1/2-1 tablet by mouth daily as directed by [facility] 90 tablet 1   No current facility-administered medications for this visit.     Allergies:   Review of patient's allergies indicates no known allergies.   Social History: Social History   Social History  . Marital status: Married    Spouse name: N/A  . Number of children: 2  . Years of education: N/A   Occupational History  . retired Secretary/administrator    Social History Main Topics  . Smoking status: Never Smoker  . Smokeless tobacco: Never Used  . Alcohol use No  . Drug use: No  . Sexual activity: Not on file   Other Topics Concern  . Not on file   Social History Narrative  . No narrative on file    Family History: Family History  Problem Relation Age of Onset  . Heart failure Mother   . Cancer Father     brain  . Asthma Brother   . Asthma Daughter   . Heart disease    . Cancer    . Lymphoma Brother   . Diabetes Brother   . Hypertension Brother   . Heart attack Brother      Review of Systems: All other systems reviewed and are otherwise negative except as noted above.   Physical Exam: VS:  BP 110/70   Pulse 68   Ht 5\' 3"  (1.6 m)   Wt 150 lb (68 kg)   BMI 26.57 kg/m  , BMI Body mass index is 26.57 kg/m.  GEN- The patient is elderly appearing, alert and oriented x 3 today, +resting tremor HEENT: normocephalic, atraumatic; sclera clear, conjunctiva pink; hearing intact; oropharynx clear; neck supple  Lungs- Clear to ausculation bilaterally, normal work of breathing.  No wheezes, rales,  rhonchi Heart- Regular rate and rhythm (paced) GI- soft, non-tender, non-distended, bowel sounds present  Extremities- no clubbing, cyanosis, or edema  MS- no significant deformity or atrophy Skin- warm and dry, no rash or lesion; PPM pocket well healed Psych- euthymic mood, full affect Neuro- strength and sensation are intact  PPM Interrogation- reviewed in detail today,  See PACEART report  EKG:  EKG is not ordered today.  Recent Labs: 01/06/2015: ALT 11; BUN 19.0; Creatinine 1.0; HGB 13.2; Platelets 171; Potassium 3.8; Sodium 142   Wt Readings from Last 3 Encounters:  10/01/15 150 lb (68 kg)  03/20/15 151 lb (68.5 kg)  01/13/15 151 lb (68.5 kg)     Other studies Reviewed: Additional studies/ records that were reviewed today include: Dr Rayann Heman and Dr Hassell Done office notes  Assessment and Plan:  1.  Complete heart block Normal PPM function See Pace Art report No changes today  2.  Chronic systolic heart failure EF normalized post CRT Worsening shortness of breath recently with associated orthopnea Will update echo BMET, BNP today If renal function ok, plan to increase Lasix for 3 days Continue current medical therapy   3.  HTN Stable No change required today  4.  Permanent atrial fibrillation Continue Warfarin for CHADS2VASC of 6 CBC today    Current medicines are reviewed at length with the patient today.   The patient does not have concerns regarding her medicines.  The following changes were made today:  none  Labs/ tests ordered today include: CBC, BMET, BNP Orders Placed This Encounter  Procedures  . Brain natriuretic peptide  . Basic metabolic panel  . CBC with Differential/Platelet  . ECHOCARDIOGRAM COMPLETE     Disposition:   Follow up with Merlin, me in 6 weeks    Signed, Chanetta Marshall, NP 10/01/2015 12:41 PM  [address] [place] [id number] [phone] (office) [phone] (fax)

## 2015-09-29 NOTE — Progress Notes (Signed)
Patient ID: DAILEY EADE                 DOB: 1928-05-20                    MRN: [id number]     HPI: Debra Lynn is a 82 y.o. female patient referred to lipid clinic by Dr. Milagros Evener from [place]. She also sees Dr. Irish Lack for atrial fibrillation. She has HLD, HTN, hx of complete heart block s/p St Jude CRTP, AF s/p failed AVN ablation, and chronic diarrhea. Today she presents with her daughter for further cholesterol management.  Pt is currently taking pravastatin 80mg  daily and Colestipol 1gm daily, and fish oil 2 gm daily for cholesterol.  She was originally placed on colestipol for her  long standing chronic diarrhea. Reports having tried imodium and lomotil in the past, but was not effective. Patient and her daughter believe colestipol is the only option that has worked for her, and thus would like to keep it unchanged.   Patient and her daughter express interests in trying diet modification to control cholesterol further, rather than changing current cholesterol medications.  Current Medications: colestipol 1g (for diarrhea), omega-3 1g BID, pravastatin 80mg  Intolerances: none Risk Factors: age, HTN LDL goal: TG <150 mg/dL, non-HDL <130 mg/dL, LDL <100 mg/dL  Diet: often eats high-starch food including corns, pasta, and bread.   Family History: Heart attack in her brother; heart failure in her mother; HTN in her brother  Social History: never smoker, never used smokeless tobacco. Reports she does not drink alcohol or use illicit drugs.   Labs: (08/28/2015) TC 204, TG 298, HDL 57, LDL 87, AST 18, ALT 12 (on pravastatin 80mg , colestipol 1g/d, omega-3 1g BID) (02/24/2015) TC 212, TG 292, HDL 66, LDL 88, AST 23, ALT 17 (on pravastatin 80mg , colestipol 1g/d)   Past Medical History:  Diagnosis Date  . Arthritis    . Atrial fibrillation ([place])    a. s/p AVN ablation  . Cancer [facility]) 12/2010   breast with recurrence s/p recent XRT  . Complete heart block ([facility])    a. s/p STJ CRTP  .  Compression fracture        . CVA 05/07/2008      . Fibrocystic breast   . GERD (gastroesophageal reflux disease)   . Glaucoma   . Hyperlipidemia   . Hypertension   . Orthostatic hypotension   . Overactive bladder   . Parkinsonism ([place])   . Pericarditis       . 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 Seasonal allergic rhinitis    Pos Skin Test 07-05-07  . Seizures ([place]) 2013      . Thoracic aneurysm without mention of rupture     Current Outpatient Prescriptions on File Prior to Visit  Medication Sig Dispense Refill  . acetaminophen (TYLENOL) 500 MG tablet Take 1,000 mg by mouth every 6 (six) hours as needed for moderate pain (pain).     Marland Kitchen anastrozole (ARIMIDEX) 1 MG tablet Take 1 tablet by mouth  daily 90 tablet 4  . benzonatate (TESSALON) 100 MG capsule TAKE 1 CAPSULE BY MOUTH 3TIMES A DAY AS NEEDED FORCOUGH 90 capsule 5  . Calcium Carbonate-Vitamin D (CALCIUM-VITAMIN D) 500-200 MG-UNIT per tablet Take 1 tablet by mouth daily.     . cephALEXin (KEFLEX) 250 MG capsule Take 1 capsule by mouth at bedtime.     . cetirizine (ZYRTEC) 10 MG tablet Take  10 mg by mouth at bedtime as needed for allergies or rhinitis.     . colestipol (COLESTID) 1 G tablet Take 1 g by mouth at bedtime.    . furosemide (LASIX) 40 MG tablet Take 1 tablet by mouth  every morning 90 tablet 2  . levETIRAcetam (KEPPRA) 500 MG tablet Take 250 mg by mouth every 12 (twelve) hours.    . Multiple Vitamin (MULTIVITAMIN WITH MINERALS) TABS tablet Take 1 tablet by mouth daily.    . Omega-3 Fatty Acids (FISH OIL) 1000 MG CAPS Take 1 capsule by mouth 2 (two) times daily.    . pantoprazole (PROTONIX) 40 MG tablet Take 1 tablet by mouth daily.    . potassium chloride SA (K-DUR,KLOR-CON) 20 MEQ tablet Take 1 tablet by mouth  daily 90 tablet 1  . pravastatin (PRAVACHOL) 80 MG tablet Take 1 tablet by mouth  daily 90 tablet 2  . Probiotic Product (ALIGN) 4 MG CAPS Take 4 mg by mouth daily. 30 capsule 0  .  propranolol (INDERAL) 60 MG tablet Take one-half tablet by  mouth twice a day 90 tablet 3  . traMADol (ULTRAM) 50 MG tablet Take 1 tablet (50 mg total) by mouth every 6 (six) hours as needed. 30 tablet 2  . warfarin (COUMADIN) 2.5 MG tablet Take 1/2-1 tablet by mouth daily as directed by [facility]mad[facility] 90 tablet 1   No current facility-administered medications on file prior to visit.     No Known Allergies  Assessment/Plan:  1. Hyperlipidemia: Patient's most recent TG is 298, elevated above goal of <150 mg/dL. LDL is 87, controlled with goal of <100 mg/dL for primary prevention. Patient has been on colestipol for her chronic diarrhea. Colestipol may elevate TG levels. Given patient has failed other anti-diarrheal products in the past, I am hesitant to change her  therapy given it is controlling her diarrhea. Will not change any of her cholesterol medication today but did educate patient on dietary changes. Advised patient to have high-fiber diets and limit starch and carbohydrates intake. Provided handout on high-fiber fruits and vegetables. Will follow up with lipid panels as scheduled with her PCP E[place] Rankins and Dr. Irish Lack.

## 2015-10-01 ENCOUNTER — Ambulatory Visit (INDEPENDENT_AMBULATORY_CARE_PROVIDER_SITE_OTHER): Payer: Medicare Other | Admitting: Nurse Practitioner

## 2015-10-01 ENCOUNTER — Encounter: Payer: Self-pay | Admitting: Nurse Practitioner

## 2015-10-01 ENCOUNTER — Encounter: Payer: Self-pay | Admitting: Internal Medicine

## 2015-10-01 VITALS — BP 110/70 | HR 68 | Ht 63.0 in | Wt 150.0 lb

## 2015-10-01 DIAGNOSIS — I442 Atrioventricular block, complete: Secondary | ICD-10-CM

## 2015-10-01 DIAGNOSIS — R0602 Shortness of breath: Secondary | ICD-10-CM

## 2015-10-01 LAB — CBC WITH DIFFERENTIAL/PLATELET
Basophils Absolute: 60 cells/uL (ref 0–200)
Basophils Relative: 1 %
Eosinophils Absolute: 240 cells/uL (ref 15–500)
Eosinophils Relative: 4 %
HCT: 36.7 % (ref 35.0–45.0)
Hemoglobin: 12.4 g/dL (ref 11.7–15.5)
Lymphocytes Relative: 34 %
Lymphs Abs: 2040 cells/uL (ref 850–3900)
MCH: 29.6 pg (ref 27.0–33.0)
MCHC: 33.8 g/dL (ref 32.0–36.0)
MCV: 87.6 fL (ref 80.0–100.0)
MPV: 10.3 fL (ref 7.5–12.5)
Monocytes Absolute: 480 cells/uL (ref 200–950)
Monocytes Relative: 8 %
Neutro Abs: 3180 cells/uL (ref 1500–7800)
Neutrophils Relative %: 53 %
Platelets: 155 10*3/uL (ref 140–400)
RBC: 4.19 MIL/uL (ref 3.80–5.10)
RDW: 13.9 % (ref 11.0–15.0)
WBC: 6 10*3/uL (ref 3.8–10.8)

## 2015-10-01 LAB — BASIC METABOLIC PANEL
BUN: 19 mg/dL (ref 7–25)
CO2: 24 mmol/L (ref 20–31)
Calcium: 9.6 mg/dL (ref 8.6–10.4)
Chloride: 101 mmol/L (ref 98–110)
Creat: 0.95 mg/dL — ABNORMAL HIGH (ref 0.60–0.88)
Glucose, Bld: 89 mg/dL (ref 65–99)
Potassium: 4.4 mmol/L (ref 3.5–5.3)
Sodium: 139 mmol/L (ref 135–146)

## 2015-10-01 LAB — BRAIN NATRIURETIC PEPTIDE: Brain Natriuretic Peptide: 92 pg/mL (ref <100)

## 2015-10-01 NOTE — Patient Instructions (Addendum)
Medication Instructions:  Your physician recommends that you continue on your current medications as directed. Please refer to the Current Medication list given to you today.  Labwork: Your physician recommends that you have lab work today- BMET and BNP  Testing/Procedures: Your physician has requested that you have an echocardiogram. Echocardiography is a painless test that uses sound waves to create images of your heart. It provides your doctor with information about the size and shape of your heart and how well your heart's chambers and valves are working. This procedure takes approximately one hour. There are no restrictions for this procedure.  Follow-Up: Your physician recommends that you schedule a follow-up appointment in: 6 weeks with Chanetta Marshall NP.  If you need a refill on your cardiac medications before your next appointment, please call your pharmacy.

## 2015-10-02 ENCOUNTER — Other Ambulatory Visit: Payer: Self-pay | Admitting: Interventional Cardiology

## 2015-10-02 ENCOUNTER — Telehealth: Payer: Self-pay | Admitting: Interventional Cardiology

## 2015-10-02 NOTE — Telephone Encounter (Signed)
New Message  Pt daughter call stating she was returning a call to RN. Please call back to discuss

## 2015-10-02 NOTE — Telephone Encounter (Signed)
Pt's daughter is aware of pt's lab results and NP's recommendations. Pt to double her lasix for the next 3 days to see if her SOB improved. Call on Tuesday next week with update on symptoms. Pt's daughter [name] verbalized understanding.

## 2015-10-03 LAB — CUP PACEART INCLINIC DEVICE CHECK
Date Time Interrogation Session: 20170901082623
Implantable Lead Implant Date: 20081223
Implantable Lead Implant Date: 20081223
Implantable Lead Implant Date: 20081223
Implantable Lead Location: 753858
Implantable Lead Location: 753859
Implantable Lead Location: 753860
Implantable Lead Model: 5024
Implantable Lead Model: 5524
Pulse Gen Model: 3222
Pulse Gen Serial Number: 7738731

## 2015-10-08 ENCOUNTER — Encounter: Payer: Self-pay | Admitting: Nurse Practitioner

## 2015-10-08 ENCOUNTER — Telehealth: Payer: Self-pay

## 2015-10-08 DIAGNOSIS — R0602 Shortness of breath: Secondary | ICD-10-CM

## 2015-10-08 NOTE — Telephone Encounter (Signed)
Called patient to see how she is feeling. Patient sounds SOB and stated that she does not feel different than when she was at the office last week. Patient denies any chest pain or edema. Patient reported her BP at 132/74. Informed patient that her daughter stated she was doing better on Monday. Patient stated she might have felt some what better, but really did not notice. Will forward to Chanetta Marshall NP for further advisement.

## 2015-10-08 NOTE — Telephone Encounter (Signed)
Will forward to Dr Varanasi 

## 2015-10-08 NOTE — Telephone Encounter (Signed)
BNP normal.  Doubt extra diuretic will help.  Would check chest xray and return to original dose of diuretic.

## 2015-10-08 NOTE — Telephone Encounter (Signed)
Would have Dr. Irish Lack review for further recommendations. BNP normal, Hgb normal, she is therapeutic on Warfarin so PE is less likely.  May be worth longer trial of increased diuretic but would defer to Dr Irish Lack.  Chanetta Marshall, NP 10/08/2015 2:03 PM

## 2015-10-08 NOTE — Telephone Encounter (Signed)
Patient's daughter sent message through MyChart about patient's SOB not improving with the increase of Lasix for 3 days. Patient took Lasix BID on Friday, Saturday and Sunday. Patient's daughter reported that patient was doing better on Monday, and felt like patient was okay yesterday, but has not really improved. Will call patient later this morning when she is awake, per patient's daughter, to get more information.

## 2015-10-09 ENCOUNTER — Ambulatory Visit (INDEPENDENT_AMBULATORY_CARE_PROVIDER_SITE_OTHER): Payer: Medicare Other

## 2015-10-09 ENCOUNTER — Ambulatory Visit (INDEPENDENT_AMBULATORY_CARE_PROVIDER_SITE_OTHER): Payer: Medicare Other | Admitting: *Deleted

## 2015-10-09 DIAGNOSIS — Z23 Encounter for immunization: Secondary | ICD-10-CM

## 2015-10-09 DIAGNOSIS — J309 Allergic rhinitis, unspecified: Secondary | ICD-10-CM | POA: Diagnosis not present

## 2015-10-09 NOTE — Telephone Encounter (Signed)
**Note De-Identified  Obfuscation** Per Dr Irish Lack it is ok for the pt to have her CXR on the same day as her Echo on 9/13. I called the pts daughter as she requested at [phone] but did not get an answer so I left a message.  Will forward message to Triage to call the pt and arrange her CXR.

## 2015-10-09 NOTE — Telephone Encounter (Signed)
Follow Up Call  Returning a call , Please call at [phone]

## 2015-10-09 NOTE — Telephone Encounter (Signed)
The pts daughter wants to know if Dr Irish Lack is aware that the pt is having an Echo on 9/13 and wants to know if she will need this CXR. If he wants her to have the CXR the pts daughter wants to know if it can be done on the same day as her Echo?    Please advise.

## 2015-10-09 NOTE — Telephone Encounter (Addendum)
LMTCB on both the pts home and cell phones.

## 2015-10-10 NOTE — Telephone Encounter (Signed)
Spoke with daughter and informed her ok to do CXR on same day as echo.  Advised her where to go for CXR.  Will place orders.

## 2015-10-15 ENCOUNTER — Telehealth: Payer: Self-pay

## 2015-10-15 ENCOUNTER — Other Ambulatory Visit: Payer: Self-pay

## 2015-10-15 ENCOUNTER — Ambulatory Visit (HOSPITAL_COMMUNITY): Payer: Medicare Other | Attending: Cardiovascular Disease

## 2015-10-15 ENCOUNTER — Ambulatory Visit
Admission: RE | Admit: 2015-10-15 | Discharge: 2015-10-15 | Disposition: A | Discharge status: Home / Self Care | Payer: Medicare Other | Source: Ambulatory Visit | Attending: Interventional Cardiology | Admitting: Interventional Cardiology

## 2015-10-15 DIAGNOSIS — E785 Hyperlipidemia, unspecified: Secondary | ICD-10-CM | POA: Diagnosis not present

## 2015-10-15 DIAGNOSIS — I351 Nonrheumatic aortic (valve) insufficiency: Secondary | ICD-10-CM | POA: Insufficient documentation

## 2015-10-15 DIAGNOSIS — R0602 Shortness of breath: Secondary | ICD-10-CM | POA: Diagnosis not present

## 2015-10-15 DIAGNOSIS — I340 Nonrheumatic mitral (valve) insufficiency: Secondary | ICD-10-CM | POA: Insufficient documentation

## 2015-10-15 DIAGNOSIS — I10 Essential (primary) hypertension: Secondary | ICD-10-CM | POA: Diagnosis not present

## 2015-10-15 DIAGNOSIS — R0600 Dyspnea, unspecified: Secondary | ICD-10-CM | POA: Diagnosis present

## 2015-10-15 DIAGNOSIS — I442 Atrioventricular block, complete: Secondary | ICD-10-CM | POA: Diagnosis not present

## 2015-10-15 NOTE — Telephone Encounter (Signed)
Referred to ICM clinic by Chanetta Marshall, NP.  Spoke with daughter [name] (DPR on file).  She stated the numbers listed are her numbers because her mother tends to get confused at times.   Explained ICM program and she agreed to monthly calls.  She stated her mother has been [place] Mallon of breath in the last week and Dr Irish Lack had increased the Furosemide but it did not really help.  Patient had an echo and chest x-ray done tomorrow.   She stated she is not sure if patient has any fluid but she didn't have any when Amber checked her in the office.  Explained fluid can accumulate from increase in salt intake and that may cause some fluctuation.  She agreed to have patient send ICM remote transmission tomorrow to check the fluid levels.  She asked I call her mother's number, [phone] at 11 am tomorrow to help her mother send the transmission.  Provided my ICM number.  Will contact patient tomorrow.  She stated she prefers she be contacted for follow up calls regarding transmissions.

## 2015-10-16 ENCOUNTER — Telehealth: Payer: Self-pay | Admitting: *Deleted

## 2015-10-16 ENCOUNTER — Ambulatory Visit (INDEPENDENT_AMBULATORY_CARE_PROVIDER_SITE_OTHER): Payer: Medicare Other

## 2015-10-16 DIAGNOSIS — I5022 Chronic systolic (congestive) heart failure: Secondary | ICD-10-CM

## 2015-10-16 DIAGNOSIS — Z950 Presence of cardiac pacemaker: Secondary | ICD-10-CM

## 2015-10-16 NOTE — Progress Notes (Signed)
EPIC Encounter for ICM Monitoring  Patient Name: Debra Lynn is a 82 y.o. female Date: 10/16/2015 Primary Care Physican: Aretta Nip, MD Primary Cardiologist: Irish Lack Electrophysiologist: Allred Dry Weight: unknown Bi-V Pacing:  >99%       1st ICM encounter.  Spoke with patient today to assist in sending remote transmission.  Spoke with daughter, [name] on 10/15/2015 for ICM intro.  She normally takes all the calls for her mother due to her mother easily gets confused.  Patient had increase in SOB in the last week and Dr Irish Lack increased Furosemide 40 mg to bid x 3 days and daughter reported it has not improved the breathing.  Patient had chest x-ray and echocardiogram on 10/15/2015 and waiting on the results.   Spoke with patient today.  She reported after taking increase in Furosemide on 10/02/2015 x 3 days, there has been no improvement in shortness of breath.   Thoracic impedance abnormal suggesting fluid accumulation 10/07/2015 to 10/12/2015 and returned to normal 10/12/2015.  Recommendations:   Reviewed transmission with Chanetta Marshall, NP in office and she had dicussed test results and patients SOB with Dr Irish Lack.  Recommendation for patient to follow up with PCP for SOB as patient's echo and chest x-ray from 10/15/2015 were normal, Furosemide did not resolve the SOB and from heart stand point she is stable.  Office appointment for 11/12/2015 with Luetta Nutting can be canceled and schedule a regular follow up appointment in a year with Dr Rayann Heman.        Call to patient's daughter, [name] and provided recommendation from Chanetta Marshall, NP, that patient should follow up with PCP since from heart stand point, patient is stable.  Advised the chest x-ray and echo were normal. Patient does not need the appointment on 11/12/2015 and should make a regular follow up appointment in a year with Dr Rayann Heman and Dr Irish Lack in 3 months.  Advised scheduler will call her for the appointment.      Follow-up plan: ICM clinic phone appointment on 11/12/2015.    Copy of ICM check sent to primary cardiologist and device physician.   ICM trend: 10/16/2015       [name], RN 10/16/2015 11:05 AM

## 2015-10-16 NOTE — Telephone Encounter (Signed)
-----   Message from Patsey Berthold, NP sent at 10/15/2015  2:47 PM EDT ----- Please notify patient of normal echo results.

## 2015-10-16 NOTE — Telephone Encounter (Signed)
SPOKE TO PT ABOUT RESULTS AND VERBALIZED UNDERSTANDING  

## 2015-10-21 ENCOUNTER — Telehealth: Payer: Self-pay | Admitting: Internal Medicine

## 2015-10-21 NOTE — Telephone Encounter (Signed)
Spoke with pt's daughter, states that cardiologist had wanted pt to follow up with CY for her worsening SOB>  Pt has had worsening SOB X4-6 weeks.    Pt scheduled for opening on CY's schedule: Thursday at 11.  Nothing further needed.

## 2015-10-23 ENCOUNTER — Ambulatory Visit (INDEPENDENT_AMBULATORY_CARE_PROVIDER_SITE_OTHER): Payer: Medicare Other | Admitting: Internal Medicine

## 2015-10-23 ENCOUNTER — Other Ambulatory Visit (INDEPENDENT_AMBULATORY_CARE_PROVIDER_SITE_OTHER): Payer: Medicare Other

## 2015-10-23 ENCOUNTER — Encounter: Payer: Self-pay | Admitting: Internal Medicine

## 2015-10-23 ENCOUNTER — Ambulatory Visit (INDEPENDENT_AMBULATORY_CARE_PROVIDER_SITE_OTHER): Payer: Medicare Other | Admitting: *Deleted

## 2015-10-23 ENCOUNTER — Ambulatory Visit: Payer: Medicare Other

## 2015-10-23 VITALS — BP 120/76 | HR 62 | Ht 63.0 in | Wt 149.2 lb

## 2015-10-23 DIAGNOSIS — J309 Allergic rhinitis, unspecified: Secondary | ICD-10-CM | POA: Diagnosis not present

## 2015-10-23 DIAGNOSIS — R0600 Dyspnea, unspecified: Secondary | ICD-10-CM

## 2015-10-23 DIAGNOSIS — R0609 Other forms of dyspnea: Secondary | ICD-10-CM | POA: Insufficient documentation

## 2015-10-23 LAB — SEDIMENTATION RATE: Sed Rate: 7 mm/hr (ref 0–30)

## 2015-10-23 LAB — HEPATIC FUNCTION PANEL
ALT: 12 U/L (ref 0–35)
AST: 17 U/L (ref 0–37)
Albumin: 4.4 g/dL (ref 3.5–5.2)
Alkaline Phosphatase: 28 U/L — ABNORMAL LOW (ref 39–117)
Bilirubin, Direct: 0 mg/dL (ref 0.0–0.3)
Total Bilirubin: 0.5 mg/dL (ref 0.2–1.2)
Total Protein: 7.6 g/dL (ref 6.0–8.3)

## 2015-10-23 LAB — D-DIMER, QUANTITATIVE: D-Dimer, Quant: 0.33 mcg/mL FEU (ref <0.50)

## 2015-10-23 LAB — C-REACTIVE PROTEIN: CRP: 0.3 mg/dL — ABNORMAL LOW (ref 0.5–20.0)

## 2015-10-23 NOTE — Progress Notes (Signed)
HPI  F never smoker followed for allergic rhinosinusitis, chronic cough, complicated by CVA, GERD, pacemaker, HBP.Marland Kitchen daughter here   11/22/13- 82 year old female never smoker followed for allergic rhinosinusitis, chronic cough, lung nodules, complicated by CVA, GERD, pacemaker/ AFib, R mastectomy, HBP.Marland Kitchen daughter here FOLLOW FOR:  Yearly follow up for Allergies    Daughter here    Had flu vax CT had shown small lung nodules- Dr Jana Hakim scheduling f/u CT Dec, 2015.  Slight nasal drip, Cough much better w Tessalon.  Denies sinus infections- credits allergy vaccine 1:10 GH. We discussed how to assess and can try changing to every other week. CT chest 02/21/13 IMPRESSION:  1. Small lung nodules. The more discrete nodules were present  previously, but have mildly increased in size since the CT dated  06/18/2010. Smaller semi-solid nodules noted on the current exam  were not convincingly present previously.  2. No other evidence suggesting metastatic disease within the chest.  Electronically Signed  By: Lajean Manes M.D.  On: 02/21/2013 14:58  11/28/14- 82 year old female never smoker followed for allergic rhinosinusitis, chronic cough, lung nodules, complicated by CVA, GERD, pacemaker/ AFib, R mastectomy, HBP.Marland Kitchen Husband here            had flu vaccine Allergy vaccine 1:10  FOLLOWS FOR: pt. states she has dry cough and runny nose. denies congestion. still doing vaccine. CT reviewed CT chest 12/11/13 IMPRESSION: 1. Tiny pulmonary nodules which are similar to the prior exam of 02/21/2013. These are nonspecific. They could be post infectious/inflammatory. Metastatic disease is felt unlikely, given stability. 2. No other evidence of metastatic disease within the chest. 3. Borderline aneurysmal dilatation of the ascending aorta, similar 4. Moderate hepatic steatosis. 5. Abberant right subclavian artery. Electronically Signed  By: Abigail Miyamoto M.D.  On: 12/11/2013  16:01  10/23/2015-82 year old female never smoker followed for allergic rhinitis, chronic cough, lung nodules, complicated by CVA, GERD, pacemaker/A. Fib/ warfarin, right mastectomy, HBP Allergy Vaccine 1:10 ACUTE VISIT: Worsening SOB and appt requested per Cardiology; they have done tests and lab to rule out heart releated.  CBC, BMET and BNP unremarkable 10/01/2015 Cardiology had her doubled her Lasix for 3 days at that time to see if it helped. Patient and daughter thinks she has been more short of breath at rest and with exertion gradually over the last couple of months. Noticed especially if she goes for walk with husband-"gives out" without chest pain, palpitation, cough, wheeze, leg pain or swelling. Has had a couple of small cramps and right leg. Has continued warfarin with stable INR control. No myalgias. Has had some nonspecific frontal headaches. Seen by cardiology end of August and not felt to have an acute cardiac issue. Pacemaker works well. She uses rolling walker with no falls. CXR 10/15/2015 Cardiac silhouette is mildly enlarged. There is a stable right anterior chest wall biventricular cardioverter-defibrillator. No mediastinal or hilar masses or evidence of adenopathy. Clear lungs.  No pleural effusion or pneumothorax. Bony thorax is demineralized but intact. IMPRESSION: No acute cardiopulmonary disease. Electronically Signed   By: Lajean Manes M.D.   On: 10/15/2015 15:18 Walk Test room air 10/23/2015-370 feet, desaturation to 95% with complaint of fatigue and stop several times to rest. 1 minute after walk room air saturation was 97% with heart rate 60. No oxygen limitation. Impression-weakness, not respiratory limited.  ROS-Per HPI Constitutional:   No-   weight loss, night sweats, fevers, chills, fatigue, lassitude. HEENT:   No-  headaches, difficulty swallowing, tooth/dental problems, sore throat,  No-  sneezing, itching, ear ache, nasal congestion, post nasal  drip,  CV:  No-   chest pain, orthopnea, PND, swelling in lower extremities, anasarca, dizziness, palpitations Resp: No-   shortness of breath with exertion or at rest.              No-   productive cough,   non-productive cough,  No-  coughing up of blood.              No-   change in color of mucus.  wheezing.   Skin: No-   rash or lesions. GI:  No-   heartburn, indigestion, abdominal pain, nausea, vomiting,  GU:  MS:  No-   joint pain or swelling.   Neuro- nothing unusual Psych:  No- change in mood or affect. No depression or anxiety.  No memory loss.   OBJ General- Alert, Oriented, Affect-appropriate, Distress- none acute Skin- rash-none, lesions- none, excoriation- none Lymphadenopathy- none Head- atraumatic            Eyes- Gross vision intact, PERRLA, conjunctivae clear secretions            Ears- Hearing, canals-normal            Nose- Clear, no-Septal dev, mucus, polyps, erosion, perforation             Throat- Mallampati III , mucosa clear , drainage- none, tonsils- atrophic.+ Hesitant speech/tremor                                      without stridor Neck- flexible , trachea midline, no stridor , thyroid nl, carotid no bruit Chest - symmetrical excursion , unlabored           Heart/CV- RRR , no murmur , no gallop  , no rub, nl s1 s2                           - JVD- none , edema- none, stasis changes- none, varices- none           Lung- clear to P&A, wheeze- none, cough- none , dullness-none, rub- none           Chest wall- pacemaker on right Abd-  Br/ Gen/ Rectal- Not done, not indicated Extrem- cyanosis- none, clubbing, none, atrophy- none, strength- nl, +rolling walker Neuro-+ mild tremor/ head bob, speech a little slow

## 2015-10-23 NOTE — Patient Instructions (Signed)
Order-     Walk test on room air    Dx dyspnea  Order- lab- D dimer, Hepatic function panel, sed rate, CRP        Dx dyspnea  Please call as needed  Your primary doctor may want you to try Physical Therapy to evaluate for strength and mobility

## 2015-10-23 NOTE — Assessment & Plan Note (Signed)
Cardiology did not identify cardiac basis for complaint of dyspnea but recognize cardiovascular disease. She was not anemic when last checked and chest x-ray was clear. She does not describe cough or wheeze. She did not desaturate on walk test today. I think muscle weakness and deconditioning as the probable cause, with contributions from her cardiovascular disease and Parkinson's. Plan-we will check d-dimer although she has been therapeutic on warfarin. We will check liver/muscle enzymes for myopathy. Perhaps referral to physical therapy would be some help.

## 2015-10-29 ENCOUNTER — Telehealth: Payer: Self-pay | Admitting: Internal Medicine

## 2015-10-29 NOTE — Telephone Encounter (Signed)
Notes Recorded by Deneise Lever, MD on 10/23/2015 at 8:19 PM EDT Lab tests- all normal with normal liver and muscle enzymes, no evidence of inflammation or blood clot -----  Called spoke with daughter regarding results. She verbalized understanding and needed nothing further.

## 2015-11-06 ENCOUNTER — Ambulatory Visit (INDEPENDENT_AMBULATORY_CARE_PROVIDER_SITE_OTHER): Payer: Medicare Other | Admitting: *Deleted

## 2015-11-06 DIAGNOSIS — I6789 Other cerebrovascular disease: Secondary | ICD-10-CM

## 2015-11-06 DIAGNOSIS — I4891 Unspecified atrial fibrillation: Secondary | ICD-10-CM

## 2015-11-06 DIAGNOSIS — J309 Allergic rhinitis, unspecified: Secondary | ICD-10-CM

## 2015-11-06 LAB — POCT INR: INR: 2.5

## 2015-11-12 ENCOUNTER — Telehealth: Payer: Self-pay | Admitting: Cardiology

## 2015-11-12 ENCOUNTER — Encounter: Payer: Medicare Other | Admitting: Nurse Practitioner

## 2015-11-12 NOTE — Telephone Encounter (Signed)
LMOVM reminding pt to send remote transmission.   

## 2015-11-18 NOTE — Progress Notes (Signed)
No ICM remote transmission received for 11/12/2015 and next ICM transmission scheduled for 12/01/2015.

## 2015-11-20 ENCOUNTER — Ambulatory Visit (INDEPENDENT_AMBULATORY_CARE_PROVIDER_SITE_OTHER): Payer: Medicare Other | Admitting: *Deleted

## 2015-11-20 DIAGNOSIS — J309 Allergic rhinitis, unspecified: Secondary | ICD-10-CM | POA: Diagnosis not present

## 2015-11-25 ENCOUNTER — Telehealth: Payer: Self-pay | Admitting: Internal Medicine

## 2015-11-25 NOTE — Telephone Encounter (Addendum)
Mrs. Tesfai had a ov with you 10/23/15. You did not mention discussing the allergy lab is closing or if you wanted her to cont. Vac.Marland Kitchen Please advise.

## 2015-11-26 NOTE — Telephone Encounter (Signed)
I am telling all my patients as they come in. They can choose to stay on our vaccine till we decide to close the program- maybe end of December, but don't know yet. Most patients who have been on shots a long time may do fine to quit when they run out of vaccine and wait to see how they do. If they want, we can certainly refill vaccine until the end.  Those patients who are really sure they want to stay on allergy shots can transfer to another allergist office at any time. I think [facility] on [address] across from [place] ( near our old office), [place] [address] [place] and 2 or 3 other docs there, will be a good choice for any who wish to transfer. They are on Epic, part of the [facility] system, and can access our old records. I am explaining to my patients that I am slowing down, not retiring. Patients who need help with asthma and nasal problems can still be followed by our doctors at this office. Only the allergy shots will be ending eventually.

## 2015-11-26 NOTE — Telephone Encounter (Signed)
Called pt. Spoke with Daughter. I explained CY's recs she [name]) in turn conveyed the message to Mrs.Weinhold she said she would stop now and see how she does. Pt. Understood, nothing further needed. Closing.

## 2015-12-01 ENCOUNTER — Ambulatory Visit (INDEPENDENT_AMBULATORY_CARE_PROVIDER_SITE_OTHER): Payer: Medicare Other

## 2015-12-01 DIAGNOSIS — I5022 Chronic systolic (congestive) heart failure: Secondary | ICD-10-CM

## 2015-12-01 DIAGNOSIS — Z950 Presence of cardiac pacemaker: Secondary | ICD-10-CM | POA: Diagnosis not present

## 2015-12-01 NOTE — Progress Notes (Signed)
EPIC Encounter for ICM Monitoring  Patient Name: Debra Lynn is a 82 y.o. female Date: 12/01/2015 Primary Care Physican: Aretta Nip, MD Primary Cardiologist: Irish Lack Electrophysiologist: Allred Dry Weight:    unknown Bi-V Pacing:  >99%            Spoke with daughter,  [name].  Heart Failure questions reviewed, pt asymptomatic   Thoracic impedance normal    Recommendations: No changes.  Advised to limit salt intake to 2000 mg daily.  Encouraged to call for fluid symptoms.    Follow-up plan: ICM clinic phone appointment on 01/01/2016.  Office appointment with Dr Irish Lack on 12/22/2015.  Copy of ICM check sent to device physician.   ICM trend: 12/01/2015       [name], RN 12/01/2015 11:57 AM

## 2015-12-05 ENCOUNTER — Other Ambulatory Visit: Payer: Self-pay | Admitting: Interventional Cardiology

## 2015-12-05 ENCOUNTER — Other Ambulatory Visit: Payer: Self-pay | Admitting: Internal Medicine

## 2015-12-09 NOTE — Telephone Encounter (Signed)
CY please advise if ok to give refills for the benzonatate. thanks

## 2015-12-09 NOTE — Telephone Encounter (Signed)
Ok to refill for 6 months 

## 2015-12-21 NOTE — Progress Notes (Signed)
Cardiology Office Note   Date:  12/22/2015   ID:  Debra Lynn, DOB 1929-01-12, MRN [id number]  PCP:  Aretta Nip, MD    Chief Complaint  Patient presents with  . Follow-up  AFib, syncope   Wt Readings from Last 3 Encounters:  12/22/15 67.6 kg (149 lb)  10/23/15 67.7 kg (149 lb 3.2 oz)  10/01/15 68 kg (150 lb)       History of Present Illness: Debra Lynn is a 82 y.o. female  with pacemaker and atrial fibrillation and prior CVA. She reports unsteadiness and lightheadedness as well. She saw her neurologist who felt that her neuropathy was worse. She used to have this issue with standing. Now it is more frequent and not always dependent on change in positions. Lying down gives relief. SHe is walking with a walker, now most of the time.     Still SHOB with short walks even with walker.  She has some right sided chest pain at the site of her prior surgery. No falls recently.  No bleeding problems.   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  Doing well from that standpoint.  She has right chest wall pain.  MMG in Dec 2017.  Patient's daughter states she is eating well.    She had collapsed after bending down.  SHOB has improved over the past few months.      Past Medical History:  Diagnosis Date  . Arthritis    . Atrial fibrillation ([place])    a. s/p AVN ablation  . Cancer [facility]) 12/2010   breast with recurrence s/p recent XRT  . Complete heart block ([facility])    a. s/p STJ CRTP  . Compression fracture        . CVA 05/07/2008      . Fibrocystic breast   . GERD (gastroesophageal reflux disease)   . Glaucoma   . Hyperlipidemia   . Hypertension   . Orthostatic hypotension   . Overactive bladder   . Parkinsonism ([place])   . Pericarditis       . 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 Seasonal allergic rhinitis    Pos Skin Test 07-05-07  . Seizures ([place]) 2013      . Thoracic aneurysm without mention of rupture     Past  Surgical History:  Procedure Laterality Date  . ABDOMINAL HYSTERECTOMY  1975   pt. unsure if laparascopic/vaginal/abdominal  . ABLATION  1980s   AVN ablation   . APPENDECTOMY    . BI-VENTRICULAR PACEMAKER UPGRADE  01/2007   3rd device, upgraded to [facility]JM Frontier II BiV pacemaker by Dr Leonia Reeves  . BREAST SURGERY Right 01/15/11   mastectomy  . CATARACT EXTRACTION Bilateral   . CHOLECYSTECTOMY    . EP IMPLANTABLE DEVICE N/A 06/27/2014   CRT-P  ([facility]) gen change by Dr Rayann Heman  . EYE SURGERY     For glaucoma  . FLEXIBLE BRONCHOSCOPY W/ UPPER ENDOSCOPY     and swallowing evaluation  . KNEE ARTHROSCOPY Right    Arthroscopic for torn meniscus  . MASS EXCISION Right 01/19/2013   Procedure: EXCISION CHEST WALL MASS;  Surgeon: [name], MD;  Location: [facility] ORS;  Service: General;  Laterality: Right;  . PACEMAKER INSERTION     1992; gen change 2004  . VESICOVAGINAL FISTULA CLOSURE W/ TAH       Current Outpatient Prescriptions  Medication Sig Dispense Refill  . acetaminophen (TYLENOL) 500 MG tablet  Take 1,000 mg by mouth every 6 (six) hours as needed for moderate pain (pain).     Marland Kitchen anastrozole (ARIMIDEX) 1 MG tablet Take 1 tablet by mouth  daily 90 tablet 4  . benzonatate (TESSALON) 100 MG capsule TAKE 1 CAPSULE BY MOUTH 3TIMES A DAY AS NEEDED FORCOUGH 90 capsule 5  . Calcium Carbonate-Vitamin D (CALCIUM-VITAMIN D) 500-200 MG-UNIT per tablet Take 1 tablet by mouth daily.     . cephALEXin (KEFLEX) 250 MG capsule Take 1 capsule by mouth at bedtime.     . cetirizine (ZYRTEC) 10 MG tablet Take 10 mg by mouth at bedtime as needed for allergies or rhinitis.     . colestipol (COLESTID) 1 G tablet Take 1 g by mouth at bedtime.    . furosemide (LASIX) 40 MG tablet Take 1 tablet by mouth  every morning 90 tablet 2  . levETIRAcetam (KEPPRA) 500 MG tablet Take 250 mg by mouth every 12 (twelve) hours.    . Multiple Vitamin (MULTIVITAMIN WITH MINERALS) TABS tablet Take 1 tablet by mouth daily.    .  Omega-3 Fatty Acids (FISH OIL) 1000 MG CAPS Take 1 capsule by mouth 2 (two) times daily.    . pantoprazole (PROTONIX) 40 MG tablet Take 1 tablet by mouth daily.    . potassium chloride SA (K-DUR,KLOR-CON) 20 MEQ tablet TAKE 1 TABLET BY MOUTH  DAILY 90 tablet 3  . pravastatin (PRAVACHOL) 80 MG tablet Take 1 tablet by mouth  daily 90 tablet 2  . Probiotic Product (ALIGN) 4 MG CAPS Take 4 mg by mouth daily. 30 capsule 0  . propranolol (INDERAL) 60 MG tablet Take one-half tablet by  mouth twice a day 90 tablet 1  . traMADol (ULTRAM) 50 MG tablet Take 50 mg by mouth every 6 (six) hours as needed for moderate pain or severe pain.    Marland Kitchen warfarin (COUMADIN) 2.5 MG tablet Take 1/2-1 tablet by mouth daily as directed by Coumadin Clini 90 tablet 1   No current facility-administered medications for this visit.     Allergies:   Patient has no known allergies.    Social History:  The patient  reports that she has never smoked. She has never used smokeless tobacco. She reports that she does not drink alcohol or use drugs.   Family History:  The patient's family history includes Asthma in her brother and daughter; Cancer in her father; Diabetes in her brother; Heart attack in her brother; Heart failure in her mother; Hypertension in her brother; Lymphoma in her brother.    ROS:  Please see the history of present illness.   Otherwise, review of systems are positive for lightheadedness with change in position.   All other systems are reviewed and negative.    PHYSICAL EXAM: VS:  BP 118/76 (BP Location: Left Arm, Patient Position: Sitting, Cuff Size: Normal)   Ht 5\' 3"  (1.6 m)   Wt 67.6 kg (149 lb)   SpO2 94%   BMI 26.39 kg/m  , BMI Body mass index is 26.39 kg/m. GEN: Well nourished, well developed, in no acute distress  HEENT: normal  Neck: no JVD, carotid bruits, or masses Cardiac: RRR; no murmurs, rubs, or gallops,no edema  Respiratory:  clear to auscultation bilaterally, normal work of  breathing GI: soft, nontender, nondistended, + BS MS: no deformity or atrophy  Skin: warm and dry, no rash Neuro:  Strength and sensation are intact Psych: euthymic mood, flat affect     Recent Labs: 10/01/2015: Brain  Natriuretic Peptide 92.0; BUN 19; Creat 0.95; Hemoglobin 12.4; Platelets 155; Potassium 4.4; Sodium 139 10/23/2015: ALT 12   Lipid Panel    Component Value Date/Time   CHOL 186 07/22/2014 0921   TRIG 274.0 (H) 07/22/2014 0921   HDL 59.00 07/22/2014 0921   CHOLHDL 3 07/22/2014 0921   VLDL 54.8 (H) 07/22/2014 0921   LDLCALC 97 06/21/2013 1001   LDLDIRECT 84.0 07/22/2014 0921     Other studies Reviewed: Additional studies/ records that were reviewed today with results demonstrating: LVEF 60-65% in 9/17.   ASSESSMENT AND PLAN:  1. Lightheadedness: Likely multifactorial. She has a neurologic issue causing tremor. Encouraged her to be careful when changing positions. She should always use her walker. We talked about the importance of avoiding falls given that she is on warfarin. When she was not on warfarin in the past, she did have a CVA. It is very important for her to stay on anticoagulation given her cardiomyopathy. I stressed the importance of avoiding falls as well.  2. Hyperlipidemia: well controlled.  TG increased.  Continue fish oil at dose that is tolerated.  TG have not been severely elevated in the past. She feels that higher doses of fish oil cause headache.  3. AFib: Continue Coumadin for stroke prevention. Pacemaker in place as well. 4. HTN: well controlled.  Continue current meds. 5. DOE: Improved compared to a few months ago.    Current medicines are reviewed at length with the patient today.  The patient concerns regarding her medicines were addressed.  The following changes have been made:  No change  Labs/ tests ordered today include:  No orders of the defined types were placed in this encounter.   Recommend 150 minutes/week of aerobic  exercise Low fat, low carb, high fiber diet recommended  Disposition:   FU in 1 year   Signed, Larae Grooms, MD  12/22/2015 1:46 PM    [facility] [address], [place], [place]  [address] Phone: [phone]; Fax: [phone]

## 2015-12-22 ENCOUNTER — Ambulatory Visit (INDEPENDENT_AMBULATORY_CARE_PROVIDER_SITE_OTHER): Payer: Medicare Other | Admitting: *Deleted

## 2015-12-22 ENCOUNTER — Ambulatory Visit (INDEPENDENT_AMBULATORY_CARE_PROVIDER_SITE_OTHER): Payer: Medicare Other | Admitting: Interventional Cardiology

## 2015-12-22 ENCOUNTER — Encounter: Payer: Self-pay | Admitting: Interventional Cardiology

## 2015-12-22 VITALS — BP 118/76 | Ht 63.0 in | Wt 149.0 lb

## 2015-12-22 DIAGNOSIS — R0609 Other forms of dyspnea: Secondary | ICD-10-CM

## 2015-12-22 DIAGNOSIS — I481 Persistent atrial fibrillation: Secondary | ICD-10-CM

## 2015-12-22 DIAGNOSIS — I6789 Other cerebrovascular disease: Secondary | ICD-10-CM

## 2015-12-22 DIAGNOSIS — I4891 Unspecified atrial fibrillation: Secondary | ICD-10-CM | POA: Diagnosis not present

## 2015-12-22 DIAGNOSIS — E782 Mixed hyperlipidemia: Secondary | ICD-10-CM | POA: Diagnosis not present

## 2015-12-22 DIAGNOSIS — I4819 Other persistent atrial fibrillation: Secondary | ICD-10-CM

## 2015-12-22 DIAGNOSIS — I10 Essential (primary) hypertension: Secondary | ICD-10-CM

## 2015-12-22 DIAGNOSIS — Z950 Presence of cardiac pacemaker: Secondary | ICD-10-CM

## 2015-12-22 DIAGNOSIS — R0600 Dyspnea, unspecified: Secondary | ICD-10-CM

## 2015-12-22 LAB — POCT INR: INR: 2

## 2015-12-22 NOTE — Patient Instructions (Signed)
**Note De-identified  Obfuscation** Medication Instructions:  Same-no changes  Labwork: None  Testing/Procedures: None  Follow-Up: Your physician wants you to follow-up in: 1 year. You will receive a reminder letter in the mail two months in advance. If you don't receive a letter, please call our office to schedule the follow-up appointment.      If you need a refill on your cardiac medications before your next appointment, please call your pharmacy.   

## 2016-01-01 ENCOUNTER — Ambulatory Visit (INDEPENDENT_AMBULATORY_CARE_PROVIDER_SITE_OTHER): Payer: Medicare Other

## 2016-01-01 ENCOUNTER — Telehealth: Payer: Self-pay | Admitting: Cardiology

## 2016-01-01 DIAGNOSIS — I5022 Chronic systolic (congestive) heart failure: Secondary | ICD-10-CM

## 2016-01-01 DIAGNOSIS — Z950 Presence of cardiac pacemaker: Secondary | ICD-10-CM | POA: Diagnosis not present

## 2016-01-01 NOTE — Telephone Encounter (Signed)
Spoke with pt and reminded pt of remote transmission that is due today. Pt verbalized understanding.   

## 2016-01-02 NOTE — Progress Notes (Signed)
EPIC Encounter for ICM Monitoring  Patient Name: Debra Lynn is a 82 y.o. female Date: 01/02/2016 Primary Care Physican: Aretta Nip, MD Primary Cardiologist: Irish Lack Electrophysiologist: Allred Dry Weight:unknown Bi-V Pacing: >99%                                               Spoke with daughter, Debra Lynn.  Heart Failure questions reviewed, pt asymptomatic   Thoracic impedance abnormal suggesting fluid accumulation starting 12/31/2015.  Labs: 10/01/2015 Creatinine 0.95, BUN 19, Potassium 4.4, Sodium 139 01/06/2015 Creatinine 1.0, BUN 19, Potassium 3.8, Sodium 142   Recommendations:   Increase Furosemide 40 mg 1 tablet twice a day x 2 days and increase Potassium 20 meq 1 tablet twice a day x 2 days. Return to prior dosages after 2nd day.    She verbalized understanding  Follow-up plan: ICM clinic phone appointment on 01/12/2016 to recheck fluid levels.  Copy of ICM check sent to primary cardiologist and device physician.   ICM trend: 01/01/2016       Debra Billings, RN 01/02/2016 8:21 AM

## 2016-01-05 ENCOUNTER — Telehealth: Payer: Self-pay | Admitting: Oncology

## 2016-01-05 NOTE — Telephone Encounter (Signed)
Appointments rescheduled per dtr/Cindy, 01/05/16 los. Appointment schedule given to patient's dtr.

## 2016-01-06 ENCOUNTER — Other Ambulatory Visit: Payer: Self-pay | Admitting: Emergency Medicine

## 2016-01-06 ENCOUNTER — Other Ambulatory Visit (HOSPITAL_BASED_OUTPATIENT_CLINIC_OR_DEPARTMENT_OTHER): Payer: Medicare Other

## 2016-01-06 ENCOUNTER — Other Ambulatory Visit: Payer: Medicare Other

## 2016-01-06 DIAGNOSIS — C50919 Malignant neoplasm of unspecified site of unspecified female breast: Secondary | ICD-10-CM

## 2016-01-06 LAB — CBC WITH DIFFERENTIAL/PLATELET
BASO%: 0.8 % (ref 0.0–2.0)
Basophils Absolute: 0.1 10*3/uL (ref 0.0–0.1)
EOS%: 4 % (ref 0.0–7.0)
Eosinophils Absolute: 0.3 10*3/uL (ref 0.0–0.5)
HCT: 40.6 % (ref 34.8–46.6)
HGB: 13.2 g/dL (ref 11.6–15.9)
LYMPH%: 29.1 % (ref 14.0–49.7)
MCH: 29 pg (ref 25.1–34.0)
MCHC: 32.4 g/dL (ref 31.5–36.0)
MCV: 89.5 fL (ref 79.5–101.0)
MONO#: 0.5 10*3/uL (ref 0.1–0.9)
MONO%: 7.4 % (ref 0.0–14.0)
NEUT#: 4.2 10*3/uL (ref 1.5–6.5)
NEUT%: 58.7 % (ref 38.4–76.8)
Platelets: 159 10*3/uL (ref 145–400)
RBC: 4.53 10*6/uL (ref 3.70–5.45)
RDW: 13.6 % (ref 11.2–14.5)
WBC: 7.2 10*3/uL (ref 3.9–10.3)
lymph#: 2.1 10*3/uL (ref 0.9–3.3)

## 2016-01-06 LAB — COMPREHENSIVE METABOLIC PANEL
ALT: 15 U/L (ref 0–55)
AST: 20 U/L (ref 5–34)
Albumin: 4 g/dL (ref 3.5–5.0)
Alkaline Phosphatase: 37 U/L — ABNORMAL LOW (ref 40–150)
Anion Gap: 9 mEq/L (ref 3–11)
BUN: 19.4 mg/dL (ref 7.0–26.0)
CO2: 26 mEq/L (ref 22–29)
Calcium: 9.8 mg/dL (ref 8.4–10.4)
Chloride: 105 mEq/L (ref 98–109)
Creatinine: 0.9 mg/dL (ref 0.6–1.1)
EGFR: 57 mL/min/{1.73_m2} — ABNORMAL LOW (ref >90)
Glucose: 92 mg/dl (ref 70–140)
Potassium: 4.4 mEq/L (ref 3.5–5.1)
Sodium: 140 mEq/L (ref 136–145)
Total Bilirubin: 0.51 mg/dL (ref 0.20–1.20)
Total Protein: 7.7 g/dL (ref 6.4–8.3)

## 2016-01-12 ENCOUNTER — Ambulatory Visit (INDEPENDENT_AMBULATORY_CARE_PROVIDER_SITE_OTHER): Payer: Medicare Other

## 2016-01-12 DIAGNOSIS — I5022 Chronic systolic (congestive) heart failure: Secondary | ICD-10-CM

## 2016-01-12 DIAGNOSIS — Z950 Presence of cardiac pacemaker: Secondary | ICD-10-CM

## 2016-01-12 NOTE — Progress Notes (Signed)
ID: Debra Lynn   DOB: [birth year]  MR#: [id number]  WIO#:[id number]  PCP: Aretta Nip, MD GYN: SURolm Bookbinder OTHER LA:GTXMIWO [name]  CHIEF COMPLAINT: Locally recurrent breast cancer  CURRENT TREATMENT: Anastrozole  BREAST CANCER HISTORY: From the original intake note:  The patient had routine screening mammography 12/15/2010 at [place]. She has heterogeneously dense breast tissue. There was a suggestion of a new mass in the upper inner quadrant of the right breast and she was brought back November 16 for right diagnostic mammography and ultrasonography. Spot compression views confirmed a 1.8 cm multilobulated mass which by ultrasound measured 2.1 cm, was ill-defined and hypoechoic. Biopsies November 19 ([id number]) showed an invasive ductal carcinoma, grade 2, strongly e-cadherin positive, estrogen receptor 90% positive, progesterone receptor negative, with no HER to amplification by CISH. The MIB-1 was 25%.  The patient is not a candidate for MRI because she has a pacemaker in place. Accordingly she underwent BSGI, showing a solitary focus of increased uptake, measuring 2.3 cm. With this information she was seen at the [facility] and a recommendation tomproceed with surgery was made. She had a Right simple mastectomy 01/15/2011.   Her subsequent history is as detailed below.   INTERVAL HISTORY: Allsion returns today for follow-up of her locally recurrent breast cancer, accompanied by her daughter Debra Lynn. [name] continues on anastrozole, which she tolerates well. Hot flashes and vaginal dryness are not a major issue. She never developed the arthralgias or myalgias that many patients can experience on this medication. She obtains it at a good price.   REVIEW OF SYSTEMS: Ridley "doesn't do much" during the day. She reads magazines. She gets out of bed about 10 in the morning, goes to bed in the evening at 9 PM, and in between takes an afternoon nap. She feels quite dizzy whenever she stands  up. She fell about 2 months ago, more really slumped down, without injuring herself. She uses a chair walker all the time. She is careful to wait a little after she stands before starting to walk. Aside from these issues a detailed review of systems today was stable.  PAST MEDICAL HISTORY: Past Medical History:  Diagnosis Date  . Arthritis    . Atrial fibrillation ([place])    a. s/p AVN ablation  . Cancer [facility]) 12/2010   breast with recurrence s/p recent XRT  . Complete heart block ([facility])    a. s/p STJ CRTP  . Compression fracture        . CVA 05/07/2008      . Fibrocystic breast   . GERD (gastroesophageal reflux disease)   . Glaucoma   . Hyperlipidemia   . Hypertension   . Orthostatic hypotension   . Overactive bladder   . Parkinsonism ([place])   . Pericarditis       . 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 Seasonal allergic rhinitis    Pos Skin Test 07-05-07  . Seizures ([place]) 2013      . Thoracic aneurysm without mention of rupture     PAST SURGICAL HISTORY: Past Surgical History:  Procedure Laterality Date  . ABDOMINAL HYSTERECTOMY  1975   pt. unsure if laparascopic/vaginal/abdominal  . ABLATION  1980s   AVN ablation   . APPENDECTOMY    . BI-VENTRICULAR PACEMAKER UPGRADE  01/2007   3rd device, upgraded to SJM Frontier II BiV pacemaker by Dr Leonia Reeves  . BREAST SURGERY Right 01/15/11   mastectomy  . CATARACT EXTRACTION Bilateral   .  CHOLECYSTECTOMY    . EP IMPLANTABLE DEVICE N/A 06/27/2014   CRT-P  ([facility]) gen change by Dr Rayann Heman  . EYE SURGERY     For glaucoma  . FLEXIBLE BRONCHOSCOPY W/ UPPER ENDOSCOPY     and swallowing evaluation  . KNEE ARTHROSCOPY Right    Arthroscopic for torn meniscus  . MASS EXCISION Right 01/19/2013   Procedure: EXCISION CHEST WALL MASS;  Surgeon: [name], MD;  Location: [facility];  Service: General;  Laterality: Right;  . PACEMAKER INSERTION     1992; gen change 2004  . VESICOVAGINAL FISTULA CLOSURE W/ TAH       FAMILY HISTORY Family History  Problem Relation Age of Onset  . Heart failure Mother   . Cancer Father     brain  . Asthma Brother   . Asthma Daughter   . Heart disease    . Cancer    . Lymphoma Brother   . Diabetes Brother   . Hypertension Brother   . Heart attack Brother   The patient's father died at the age of 9 from central nervous system cancer. The patient's mother died at the age of 46. The patient had one brother who had a history of lymphoma, but survives.  GYNECOLOGIC HISTORY: GX P2. 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 Her husband [name] used to work for [employer]. Daughter Debra Lynn lives in [place] and is a retired Merchant navy officer. Debra Lynn is a widow of my former patient "Debra Lynn" [name] Daughter Debra Lynn lives in [place] where she works as a Orthoptist. The patient has 2 grandsons. She is not a Ambulance person.    ADVANCED DIRECTIVES: in place  HEALTH MAINTENANCE: Social History  Substance Use Topics  . Smoking status: Never Smoker  . Smokeless tobacco: Never Used  . Alcohol use No     Colonoscopy:  PAP:  Bone density: SOLIS Jan 2013, T - 1.7 R fem neck  Lipid panel:  No Known Allergies  Current Outpatient Prescriptions  Medication Sig Dispense Refill  . acetaminophen (TYLENOL) 500 MG tablet Take 1,000 mg by mouth every 6 (six) hours as needed for moderate pain (pain).     Marland Kitchen anastrozole (ARIMIDEX) 1 MG tablet Take 1 tablet by mouth  daily 90 tablet 4  . benzonatate (TESSALON) 100 MG capsule TAKE 1 CAPSULE BY MOUTH 3TIMES A DAY AS NEEDED FORCOUGH 90 capsule 5  . Calcium Carbonate-Vitamin D (CALCIUM-VITAMIN D) 500-200 MG-UNIT per tablet Take 1 tablet by mouth daily.     . cephALEXin (KEFLEX) 250 MG capsule Take 1 capsule by mouth at bedtime.     . cetirizine  (ZYRTEC) 10 MG tablet Take 10 mg by mouth at bedtime as needed for allergies or rhinitis.     . colestipol (COLESTID) 1 G tablet Take 1 g by mouth at bedtime.    . furosemide (LASIX) 40 MG tablet Take 1 tablet by mouth  every morning 90 tablet 2  . levETIRAcetam (KEPPRA) 500 MG tablet Take 250 mg by mouth every 12 (twelve) hours.    . Multiple Vitamin (MULTIVITAMIN WITH MINERALS) TABS tablet Take 1 tablet by mouth daily.    . Omega-3 Fatty Acids (FISH OIL) 1000 MG CAPS Take 1 capsule by mouth 2 (two) times daily.    . pantoprazole (PROTONIX) 40 MG tablet Take 1  tablet by mouth daily.    . potassium chloride SA (K-DUR,KLOR-CON) 20 MEQ tablet TAKE 1 TABLET BY MOUTH  DAILY 90 tablet 3  . pravastatin (PRAVACHOL) 80 MG tablet Take 1 tablet by mouth  daily 90 tablet 2  . Probiotic Product (ALIGN) 4 MG CAPS Take 4 mg by mouth daily. 30 capsule 0  . propranolol (INDERAL) 60 MG tablet Take one-half tablet by  mouth twice a day 90 tablet 1  . traMADol (ULTRAM) 50 MG tablet Take 50 mg by mouth every 6 (six) hours as needed for moderate pain or severe pain.    Marland Kitchen warfarin (COUMADIN) 2.5 MG tablet Take 1/2-1 tablet by mouth daily as directed by [facility]mad[facility] 90 tablet 1   No current facility-administered medications for this visit.     OBJECTIVE: Elderly white woman  Vitals:   01/13/16 1304  BP: 127/60  Pulse: 60  Resp: 18  Temp: 97.6 F (36.4 C)     Body mass index is 26.8 kg/m.    ECOG FS: 3  Sclerae unicteric, pupils round and equal Oropharynx clear and moist-- no thrush or other lesions No cervical or supraclavicular adenopathy Lungs no rales or rhonchi Heart regular rate and rhythm Abd soft, nontender, positive bowel sounds MSK no focal spinal tenderness, no upper extremity lymphedema Neuro: Resting tremor, otherwise nonfocal, well oriented, appropriate affect Breasts: The right breast is status post mastectomy. There is no evidence of local recurrence. The left breast is  unremarkable.    LAB RESULTS: Lab Results  Component Value Date   WBC 7.2 01/06/2016   NEUTROABS 4.2 01/06/2016   HGB 13.2 01/06/2016   HCT 40.6 01/06/2016   MCV 89.5 01/06/2016   PLT 159 01/06/2016      Chemistry      Component Value Date/Time   NA 140 01/06/2016 1420   K 4.4 01/06/2016 1420   CL 101 10/01/2015 1120   CL 104 03/23/2012 1329   CO2 26 01/06/2016 1420   BUN 19.4 01/06/2016 1420   CREATININE 0.9 01/06/2016 1420      Component Value Date/Time   CALCIUM 9.8 01/06/2016 1420   ALKPHOS 37 (L) 01/06/2016 1420   AST 20 01/06/2016 1420   ALT 15 01/06/2016 1420   BILITOT 0.51 01/06/2016 1420       Lab Results  Component Value Date   LABCA2 15 12/30/2010    No components found for: [id number]  No results for input(s): INR in the last 168 hours.  Urinalysis    Component Value Date/Time   COLORURINE YELLOW 12/14/2013 1608   APPEARANCEUR CLEAR 12/14/2013 1608   LABSPEC 1.004 (L) 12/14/2013 1608   PHURINE 6.5 12/14/2013 1608   GLUCOSEU NEGATIVE 12/14/2013 1608   HGBUR NEGATIVE 12/14/2013 1608   BILIRUBINUR NEGATIVE 12/14/2013 1608   KETONESUR NEGATIVE 12/14/2013 1608   PROTEINUR NEGATIVE 12/14/2013 1608   UROBILINOGEN 0.2 12/14/2013 1608   NITRITE NEGATIVE 12/14/2013 1608   LEUKOCYTESUR NEGATIVE 12/14/2013 1608    STUDIES: No results found.   ASSESSMENT: 82 y.o. [place] woman status post simple mastectomy 01/15/2011 for a Right upper inner quadrant T2 NX (stage II) invasive ductal carcinoma, grade 3, 100% estrogen receptor positive, with a borderline MIB-1, progesterone receptor and HER-2 negative.  (1) not a candidate for tamoxifen due to cardiac issues and decided against aromatase inhibitors because of osteopenia present at baseline and concerns re. bisphosphonates  (2) local recurrence to the right chest wall documented by biopsy 01-2013 showing an invasive ductal carcinoma, grade  1 or 2, estrogen receptor strongly positive, progesterone  receptor and HER-2 negative, with an MIB-1 of 28%.  (3) excision of the right chest wall mass 01/19/2013 with negative but close margins (the deep margin is skeletal muscle).  (4) status post radiation therapy to the right chest wall completed 04/23/2013  (5) Ct chest January 2015 shows small bilateral lung lesions many of which are longstanding; repeat chest CT November 2015 showed no change; CXR 10/15/2015 negative  (6) anastrozole started January 2015;   (a) osteopenia per bone density January 2013 with T -1.5 as lowest score  (b) repeat bone density at [facility] 02/11/2015 shows a T score of -2.7 mammary little Ms. Edsel Petrin from [facility] ER she just arrived as she had a mammogram yesterday and they told her that they was admitted to okay thank you   PLAN: Deandre is now 3 years out from her chest wall recurrence with no evidence of disease activity. This is very favorable.  She is tolerating anastrozole well. The plan will be to continue that for a total of 5 years.  She just had mammography yesterday. Those results have not yet been read. If there is anything suspicious of course I will let her know.  Otherwise she will return to see me in one year. She knows to call for any problems that may develop before that visit.     Chauncey Cruel, MD   01/13/2016

## 2016-01-12 NOTE — Progress Notes (Signed)
EPIC Encounter for ICM Monitoring  Patient Name: Debra Lynn is a 82 y.o. female Date: 01/12/2016 Primary Care Physican: Aretta Nip, MD Primary Cardiologist:[place] Electrophysiologist: Allred Dry Weight:unknown Bi-V Pacing: >99%                                                  Spoke with daughter,Debra Lynn.   Heart Failure questions reviewed, pt asymptomatic   Thoracic impedance returned to normal after increase in Furosemide x 2 days.   Labs: 10/01/2015 Creatinine 0.95, BUN 19, Potassium 4.4, Sodium 139 01/06/2015 Creatinine 1.0, BUN 19, Potassium 3.8, Sodium 142   Recommendations: No changes.  Reinforced to limit low salt food choices to 2000 mg day and limiting fluid intake to < 2 liters per day. Encouraged to call for fluid symptoms.    Follow-up plan: ICM clinic phone appointment on 02/12/2016.  Copy of ICM check sent to device physician.   ICM trend: 01/12/2016       Debra Billings, RN 01/12/2016 3:15 PM

## 2016-01-13 ENCOUNTER — Ambulatory Visit (HOSPITAL_BASED_OUTPATIENT_CLINIC_OR_DEPARTMENT_OTHER): Payer: Medicare Other | Admitting: Oncology

## 2016-01-13 VITALS — BP 127/60 | HR 60 | Temp 97.6°F | Resp 18 | Ht 63.0 in | Wt 151.3 lb

## 2016-01-13 DIAGNOSIS — I4891 Unspecified atrial fibrillation: Secondary | ICD-10-CM | POA: Diagnosis not present

## 2016-01-13 DIAGNOSIS — C50211 Malignant neoplasm of upper-inner quadrant of right female breast: Secondary | ICD-10-CM

## 2016-01-13 DIAGNOSIS — C50911 Malignant neoplasm of unspecified site of right female breast: Secondary | ICD-10-CM

## 2016-01-13 DIAGNOSIS — C7989 Secondary malignant neoplasm of other specified sites: Secondary | ICD-10-CM | POA: Diagnosis not present

## 2016-02-03 ENCOUNTER — Ambulatory Visit (INDEPENDENT_AMBULATORY_CARE_PROVIDER_SITE_OTHER): Payer: Medicare Other

## 2016-02-03 DIAGNOSIS — I4891 Unspecified atrial fibrillation: Secondary | ICD-10-CM | POA: Diagnosis not present

## 2016-02-03 DIAGNOSIS — I6789 Other cerebrovascular disease: Secondary | ICD-10-CM

## 2016-02-03 LAB — POCT INR: INR: 2.1

## 2016-02-12 ENCOUNTER — Ambulatory Visit (INDEPENDENT_AMBULATORY_CARE_PROVIDER_SITE_OTHER): Payer: Medicare Other

## 2016-02-12 DIAGNOSIS — Z950 Presence of cardiac pacemaker: Secondary | ICD-10-CM

## 2016-02-12 DIAGNOSIS — I5022 Chronic systolic (congestive) heart failure: Secondary | ICD-10-CM | POA: Diagnosis not present

## 2016-02-12 NOTE — Progress Notes (Signed)
EPIC Encounter for ICM Monitoring  Patient Name: Debra Lynn is a 83 y.o. female Date: 02/12/2016 Primary Care Physican: Aretta Nip, MD Primary [place] Electrophysiologist: Allred Dry Weight:unknown Bi-V Pacing: >99%      Attempted ICM call and unable to reach daughter, [name].  Left detailed message regarding transmission.  Transmission reviewed.   Thoracic impedance normal   Labs: 10/01/2015 Creatinine 0.95, BUN 19, Potassium 4.4, Sodium 139 12/05/2016Creatinine 1.0, BUN 19, Potassium 3.8, Sodium 142   Recommendations: Provided ICM number and encouraged to call for fluid symptoms.    Follow-up plan: ICM clinic phone appointment on 03/16/2016.  Copy of ICM check sent to device physician.   3 month ICM trend : 02/12/2016   1 Year ICM trend:      [name], RN 02/12/2016 8:08 AM

## 2016-03-10 ENCOUNTER — Other Ambulatory Visit: Payer: Self-pay | Admitting: Interventional Cardiology

## 2016-03-11 ENCOUNTER — Emergency Department (HOSPITAL_COMMUNITY): Payer: Medicare Other

## 2016-03-11 ENCOUNTER — Encounter (HOSPITAL_COMMUNITY): Payer: Self-pay

## 2016-03-11 ENCOUNTER — Inpatient Hospital Stay (HOSPITAL_COMMUNITY)
Admission: EM | Admit: 2016-03-11 | Discharge: 2016-03-16 | DRG: 193 | Disposition: A | Discharge status: Skilled Nursing Facility | Payer: Medicare Other | Attending: Internal Medicine | Admitting: Internal Medicine

## 2016-03-11 DIAGNOSIS — I4891 Unspecified atrial fibrillation: Secondary | ICD-10-CM | POA: Diagnosis present

## 2016-03-11 DIAGNOSIS — B349 Viral infection, unspecified: Secondary | ICD-10-CM

## 2016-03-11 DIAGNOSIS — N390 Urinary tract infection, site not specified: Secondary | ICD-10-CM | POA: Diagnosis present

## 2016-03-11 DIAGNOSIS — J101 Influenza due to other identified influenza virus with other respiratory manifestations: Secondary | ICD-10-CM | POA: Diagnosis not present

## 2016-03-11 DIAGNOSIS — I10 Essential (primary) hypertension: Secondary | ICD-10-CM | POA: Diagnosis present

## 2016-03-11 DIAGNOSIS — Z8673 Personal history of transient ischemic attack (TIA), and cerebral infarction without residual deficits: Secondary | ICD-10-CM

## 2016-03-11 DIAGNOSIS — G9340 Encephalopathy, unspecified: Secondary | ICD-10-CM | POA: Diagnosis present

## 2016-03-11 DIAGNOSIS — R531 Weakness: Secondary | ICD-10-CM | POA: Diagnosis not present

## 2016-03-11 DIAGNOSIS — K219 Gastro-esophageal reflux disease without esophagitis: Secondary | ICD-10-CM | POA: Diagnosis present

## 2016-03-11 DIAGNOSIS — Z833 Family history of diabetes mellitus: Secondary | ICD-10-CM

## 2016-03-11 DIAGNOSIS — Z9011 Acquired absence of right breast and nipple: Secondary | ICD-10-CM

## 2016-03-11 DIAGNOSIS — E876 Hypokalemia: Secondary | ICD-10-CM

## 2016-03-11 DIAGNOSIS — I482 Chronic atrial fibrillation: Secondary | ICD-10-CM | POA: Diagnosis present

## 2016-03-11 DIAGNOSIS — M6281 Muscle weakness (generalized): Secondary | ICD-10-CM

## 2016-03-11 DIAGNOSIS — G40909 Epilepsy, unspecified, not intractable, without status epilepticus: Secondary | ICD-10-CM | POA: Diagnosis present

## 2016-03-11 DIAGNOSIS — E785 Hyperlipidemia, unspecified: Secondary | ICD-10-CM | POA: Diagnosis present

## 2016-03-11 DIAGNOSIS — Z90710 Acquired absence of both cervix and uterus: Secondary | ICD-10-CM

## 2016-03-11 DIAGNOSIS — Z807 Family history of other malignant neoplasms of lymphoid, hematopoietic and related tissues: Secondary | ICD-10-CM

## 2016-03-11 DIAGNOSIS — Z923 Personal history of irradiation: Secondary | ICD-10-CM

## 2016-03-11 DIAGNOSIS — C50211 Malignant neoplasm of upper-inner quadrant of right female breast: Secondary | ICD-10-CM | POA: Diagnosis present

## 2016-03-11 DIAGNOSIS — H409 Unspecified glaucoma: Secondary | ICD-10-CM | POA: Diagnosis present

## 2016-03-11 DIAGNOSIS — D696 Thrombocytopenia, unspecified: Secondary | ICD-10-CM | POA: Diagnosis present

## 2016-03-11 DIAGNOSIS — Z79811 Long term (current) use of aromatase inhibitors: Secondary | ICD-10-CM

## 2016-03-11 DIAGNOSIS — G20 Parkinson's disease: Secondary | ICD-10-CM | POA: Diagnosis present

## 2016-03-11 DIAGNOSIS — Z853 Personal history of malignant neoplasm of breast: Secondary | ICD-10-CM

## 2016-03-11 DIAGNOSIS — Z7901 Long term (current) use of anticoagulants: Secondary | ICD-10-CM

## 2016-03-11 DIAGNOSIS — Z950 Presence of cardiac pacemaker: Secondary | ICD-10-CM | POA: Diagnosis present

## 2016-03-11 DIAGNOSIS — J9801 Acute bronchospasm: Secondary | ICD-10-CM | POA: Diagnosis present

## 2016-03-11 DIAGNOSIS — Z8249 Family history of ischemic heart disease and other diseases of the circulatory system: Secondary | ICD-10-CM

## 2016-03-11 DIAGNOSIS — J111 Influenza due to unidentified influenza virus with other respiratory manifestations: Secondary | ICD-10-CM | POA: Diagnosis present

## 2016-03-11 HISTORY — DX: Other cerebrovascular disease: I67.89

## 2016-03-11 HISTORY — DX: Cerebral infarction, unspecified: I63.9

## 2016-03-11 LAB — CBC WITH DIFFERENTIAL/PLATELET
Basophils Absolute: 0 10*3/uL (ref 0.0–0.1)
Basophils Relative: 0 %
Eosinophils Absolute: 0 10*3/uL (ref 0.0–0.7)
Eosinophils Relative: 0 %
HCT: 38 % (ref 36.0–46.0)
Hemoglobin: 12.4 g/dL (ref 12.0–15.0)
Lymphocytes Relative: 12 %
Lymphs Abs: 0.9 10*3/uL (ref 0.7–4.0)
MCH: 29.3 pg (ref 26.0–34.0)
MCHC: 32.6 g/dL (ref 30.0–36.0)
MCV: 89.8 fL (ref 78.0–100.0)
Monocytes Absolute: 0.8 10*3/uL (ref 0.1–1.0)
Monocytes Relative: 11 %
Neutro Abs: 5.7 10*3/uL (ref 1.7–7.7)
Neutrophils Relative %: 77 %
Platelets: 123 10*3/uL — ABNORMAL LOW (ref 150–400)
RBC: 4.23 MIL/uL (ref 3.87–5.11)
RDW: 13.6 % (ref 11.5–15.5)
WBC: 7.5 10*3/uL (ref 4.0–10.5)

## 2016-03-11 LAB — BASIC METABOLIC PANEL
Anion gap: 11 (ref 5–15)
BUN: 17 mg/dL (ref 6–20)
CO2: 28 mmol/L (ref 22–32)
Calcium: 9.9 mg/dL (ref 8.9–10.3)
Chloride: 99 mmol/L — ABNORMAL LOW (ref 101–111)
Creatinine, Ser: 0.95 mg/dL (ref 0.44–1.00)
GFR calc Af Amer: >60 mL/min (ref >60)
GFR calc non Af Amer: 53 mL/min — ABNORMAL LOW (ref >60)
Glucose, Bld: 139 mg/dL — ABNORMAL HIGH (ref 65–99)
Potassium: 3.7 mmol/L (ref 3.5–5.1)
Sodium: 138 mmol/L (ref 135–145)

## 2016-03-11 LAB — I-STAT CG4 LACTIC ACID, ED: Lactic Acid, Venous: 1.84 mmol/L (ref 0.5–1.9)

## 2016-03-11 LAB — URINALYSIS, ROUTINE W REFLEX MICROSCOPIC
Bilirubin Urine: NEGATIVE
Glucose, UA: NEGATIVE mg/dL
Hgb urine dipstick: NEGATIVE
Ketones, ur: NEGATIVE mg/dL
Nitrite: NEGATIVE
Protein, ur: 100 mg/dL — AB
Specific Gravity, Urine: 1.021 (ref 1.005–1.030)
pH: 5 (ref 5.0–8.0)

## 2016-03-11 LAB — INFLUENZA PANEL BY PCR (TYPE A & B)
Influenza A By PCR: POSITIVE — AB
Influenza B By PCR: NEGATIVE

## 2016-03-11 MED ORDER — DEXTROSE 5 % IV SOLN
1.0000 g | INTRAVENOUS | Status: DC
  Administered 2016-03-12 – 2016-03-15 (×5): 1 g via INTRAVENOUS
  Filled 2016-03-11 (×5): qty 10

## 2016-03-11 MED ORDER — SODIUM CHLORIDE 0.9 % IV BOLUS (SEPSIS)
500.0000 mL | Freq: Once | INTRAVENOUS | Status: AC
  Administered 2016-03-11: 500 mL via INTRAVENOUS

## 2016-03-11 MED ORDER — OSELTAMIVIR PHOSPHATE 75 MG PO CAPS
75.0000 mg | ORAL_CAPSULE | Freq: Once | ORAL | Status: AC
  Administered 2016-03-12: 75 mg via ORAL
  Filled 2016-03-11: qty 1

## 2016-03-11 NOTE — ED Provider Notes (Signed)
[facility] DEPT Provider Note   CSN: [id number] Arrival date & time: 03/11/16  1756     History   Chief Complaint Chief Complaint  Patient presents with  . Influenza    HPI Debra Lynn is a 83 y.o. female.  HPI   Patient is a 83 year old female with history of complete heart block with pacemaker, Afib (on coumadin), hypertension, seizures, Parkinson's, CVA and breast cancer (s/p mastectomy and radiation) who presents to the ED via EMS from home with complaint of flu like sxs. Patient's daughter at bedside reports over the past 2 days her mother has been complaining chills, generalized body ache, nasal congestion, rhinorrhea, cough, shortness of breath, nausea, NBNB vomiting (x1 yesterday) and nonbloody diarrhea x1. Patient reports that her husband was admitted to the hospital yesterday with confirmed influenza. Patient states she was taking Tylenol at home, last dose last night. Patient's daughter reports that she was found earlier today on the ground in her bathroom trying to clean up her vomit. Daughter reports the patient has weakness at baseline but notes her mother appeared significantly more weak and fatigued when she saw her today and reports pt was unable to get up from the ground which is a change from her baseline. Denies HA, confusion, rash, sore throat, hemoptysis, CP, wheezing, palpitations, abdominal pain, hematemesis, urinary sxs, leg swelling. Pt denies any recent falls.   PCP - Dr. Radene Ou  Past Medical History:  Diagnosis Date  . Arthritis    . Atrial fibrillation ([place])    a. s/p AVN ablation  . Cancer [facility]) 12/2010   breast with recurrence s/p recent XRT  . Complete heart block (HCC)    a. s/p STJ CRTP  . Compression fracture        . CVA 05/07/2008      . Fibrocystic breast   . GERD (gastroesophageal reflux disease)   . Glaucoma   . Hyperlipidemia   . Hypertension   . Orthostatic hypotension   . Overactive bladder   . Parkinsonism ([place])   .  Pericarditis       . 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 Seasonal allergic rhinitis    Pos Skin Test 07-05-07  . Seizures ([place]) 2013      . Thoracic aneurysm without mention of rupture     Patient Active Problem List   Diagnosis Date Noted  . Weakness 03/11/2016  . DOE (dyspnea on exertion) 10/23/2015  . Lung nodules 12/01/2014  . Dizziness and giddiness 03/19/2014  . Resting head tremor 01/20/2013  . Parkinsonism ([place])   . Orthostatic hypotension   . Chest wall recurrence of breast cancer ([place]) 01/19/2013  . Atrial fibrillation ([place]) 11/08/2012  . Acute, but ill-defined, cerebrovascular disease 11/08/2012  . Acute ischemic stroke ([place]) 08/23/2012  . Complete heart block ([place]) 08/23/2012  . Pacemaker 08/23/2012  . HTN (hypertension) 08/23/2012  . Hyperlipidemia 08/23/2012  . Primary cancer of upper inner quadrant of right female breast ([place]) 12/30/2010  . Seasonal and perennial allergic rhinitis 04/02/2010  . CVA 05/07/2008  . G E R D 07/02/2007  . RHINOSINUSITIS, CHRONIC 05/18/2007  . Cough 05/18/2007    Past Surgical History:  Procedure Laterality Date  . ABDOMINAL HYSTERECTOMY  1975   pt. unsure if laparascopic/vaginal/abdominal  . ABLATION  1980s   AVN ablation   . APPENDECTOMY    . BI-VENTRICULAR PACEMAKER UPGRADE  01/2007   3rd device, upgraded to SJM Frontier II BiV pacemaker by Dr Leonia Reeves  .  BREAST SURGERY Right 01/15/11   mastectomy  . CATARACT EXTRACTION Bilateral   . CHOLECYSTECTOMY    . EP IMPLANTABLE DEVICE N/A 06/27/2014   CRT-P  ([facility]) gen change by Dr Rayann Heman  . EYE SURGERY     For glaucoma  . FLEXIBLE BRONCHOSCOPY W/ UPPER ENDOSCOPY     and swallowing evaluation  . KNEE ARTHROSCOPY Right    Arthroscopic for torn meniscus  . MASS EXCISION Right 01/19/2013   Procedure: EXCISION CHEST WALL MASS;  Surgeon: [name], MD;  Location: [facility] ORS;  Service: General;  Laterality: Right;  . PACEMAKER INSERTION     1992; gen  change 2004  . VESICOVAGINAL FISTULA CLOSURE W/ TAH      OB History    No data available       Home Medications    Prior to Admission medications   Medication Sig Start Date End Date Taking? Authorizing Provider  acetaminophen (TYLENOL) 500 MG tablet Take 1,000 mg by mouth every 6 (six) hours as needed for moderate pain (pain).    Yes Historical Provider, MD  anastrozole (ARIMIDEX) 1 MG tablet Take 1 tablet by mouth  daily 01/13/15  Yes Chauncey Cruel, MD  benzonatate (TESSALON) 100 MG capsule TAKE 1 CAPSULE BY MOUTH 3TIMES A DAY AS NEEDED FORCOUGH 12/09/15  Yes Deneise Lever, MD  Calcium Carbonate-Vitamin D (CALCIUM-VITAMIN D) 500-200 MG-UNIT per tablet Take 1 tablet by mouth daily.    Yes Historical Provider, MD  cephALEXin (KEFLEX) 250 MG capsule Take 1 capsule by mouth at bedtime.  05/25/11  Yes Historical Provider, MD  cetirizine (ZYRTEC) 10 MG tablet Take 10 mg by mouth at bedtime as needed for allergies or rhinitis.    Yes Historical Provider, MD  colestipol (COLESTID) 1 G tablet Take 1 g by mouth at bedtime.   Yes Historical Provider, MD  furosemide (LASIX) 40 MG tablet TAKE 1 TABLET BY MOUTH  EVERY MORNING 03/11/16  Yes Jettie Booze, MD  levETIRAcetam (KEPPRA) 500 MG tablet Take 250 mg by mouth every 12 (twelve) hours.   Yes Historical Provider, MD  Multiple Vitamin (MULTIVITAMIN WITH MINERALS) TABS tablet Take 1 tablet by mouth daily.   Yes Historical Provider, MD  Omega-3 Fatty Acids (FISH OIL) 1000 MG CAPS Take 1 capsule by mouth 2 (two) times daily.   Yes Historical Provider, MD  pantoprazole (PROTONIX) 40 MG tablet Take 1 tablet by mouth daily. 10/02/10  Yes Historical Provider, MD  potassium chloride SA (K-DUR,KLOR-CON) 20 MEQ tablet TAKE 1 TABLET BY MOUTH  DAILY Patient taking differently: TAKE 1 TABLET BY MOUTH  DAILY IN EVENING 12/08/15  Yes Jettie Booze, MD  pravastatin (PRAVACHOL) 80 MG tablet TAKE 1 TABLET BY MOUTH  DAILY Patient taking differently: TAKE  1 TABLET BY MOUTH  DAILY IN EVENING 03/11/16  Yes Jettie Booze, MD  Probiotic Product (ALIGN) 4 MG CAPS Take 4 mg by mouth daily. 01/20/13  Yes [name], MD  propranolol (INDERAL) 60 MG tablet TAKE ONE-HALF TABLET BY  MOUTH TWICE A DAY 03/11/16  Yes Jettie Booze, MD  warfarin (COUMADIN) 2.5 MG tablet Take 1/2-1 tablet by mouth daily as directed by [facility]madin Cl[facility] Patient taking differently: Take 1.25-2.5 mg by mouth daily at 6 PM. TAKES 1 TAB ON MON, WED AND FRI  TAKES 1/2 TAB ALL OTHER DAYS 07/29/15  Yes Jettie Booze, MD    Family History Family History  Problem Relation Age of Onset  . Heart failure Mother   .  Cancer Father     brain  . Asthma Brother   . Asthma Daughter   . Heart disease    . Cancer    . Lymphoma Brother   . Diabetes Brother   . Hypertension Brother   . Heart attack Brother     Social History Social History  Substance Use Topics  . Smoking status: Never Smoker  . Smokeless tobacco: Never Used  . Alcohol use No     Allergies   Patient has no known allergies.   Review of Systems Review of Systems  Constitutional: Positive for chills.  HENT: Positive for congestion and rhinorrhea.   Respiratory: Positive for cough and shortness of breath.   Gastrointestinal: Positive for diarrhea, nausea and vomiting.  Musculoskeletal: Positive for myalgias (generalized).  Neurological: Positive for weakness.  All other systems reviewed and are negative.    Physical Exam Updated Vital Signs BP 132/60   Pulse 85   Temp 98.9 F (37.2 C) (Oral)   Resp (!) 29   SpO2 97%   Physical Exam  Constitutional: She is oriented to person, place, and time. She appears well-developed and well-nourished. No distress.  HENT:  Head: Normocephalic and atraumatic.  Right Ear: Tympanic membrane normal.  Left Ear: Tympanic membrane normal.  Nose: Nose normal.  Mouth/Throat: Uvula is midline and oropharynx is clear and moist. Mucous membranes are dry.  No oropharyngeal exudate, posterior oropharyngeal edema, posterior oropharyngeal erythema or tonsillar abscesses. No tonsillar exudate.  Eyes: Conjunctivae and EOM are normal. Right eye exhibits no discharge. Left eye exhibits no discharge. No scleral icterus.  Neck: Normal range of motion. Neck supple.  Cardiovascular: Normal rate, regular rhythm, normal heart sounds and intact distal pulses.   Pulmonary/Chest: Effort normal and breath sounds normal. No respiratory distress. She has no wheezes. She has no rales. She exhibits no tenderness.  Abdominal: Soft. Bowel sounds are normal. She exhibits no distension and no mass. There is no tenderness. There is no rebound and no guarding. No hernia.  Musculoskeletal: Normal range of motion. She exhibits no edema.  Lymphadenopathy:    She has no cervical adenopathy.  Neurological: She is alert and oriented to person, place, and time.  Skin: Skin is warm and dry. No rash noted. She is not diaphoretic.  Nursing note and vitals reviewed.    ED Treatments / Results  Labs (all labs ordered are listed, but only abnormal results are displayed) Labs Reviewed  CBC WITH DIFFERENTIAL/PLATELET - Abnormal; Notable for the following:       Result Value   Platelets 123 (*)    All other components within normal limits  BASIC METABOLIC PANEL - Abnormal; Notable for the following:    Chloride 99 (*)    Glucose, Bld 139 (*)    GFR calc non Af Amer 53 (*)    All other components within normal limits  URINALYSIS, ROUTINE W REFLEX MICROSCOPIC - Abnormal; Notable for the following:    APPearance CLOUDY (*)    Protein, ur 100 (*)    Leukocytes, UA LARGE (*)    Bacteria, UA FEW (*)    Squamous Epithelial / LPF 0-5 (*)    All other components within normal limits  INFLUENZA PANEL BY PCR (TYPE A & B) - Abnormal; Notable for the following:    Influenza A By PCR POSITIVE (*)    All other components within normal limits  URINE CULTURE  I-STAT CG4 LACTIC ACID, ED     EKG  EKG Interpretation  None       Radiology Dg Chest 2 View  Result Date: 03/11/2016 CLINICAL DATA:  Flu like symptoms nonproductive cough EXAM: CHEST  2 VIEW COMPARISON:  10/15/2015 FINDINGS: Multi lead right-sided pacing device similar compared to prior. No focal consolidation or pleural effusion. Stable cardiomediastinal silhouette with prominent left pericardial fat pad. Atherosclerosis of the aorta. No pneumothorax. IMPRESSION: No active cardiopulmonary disease. Electronically Signed   By: Donavan Foil M.D.   On: 03/11/2016 19:17    Procedures Procedures (including critical care time)  Medications Ordered in ED Medications  oseltamivir (TAMIFLU) capsule 75 mg (not administered)  cefTRIAXone (ROCEPHIN) 1 g in dextrose 5 % 50 mL IVPB (not administered)  sodium chloride 0.9 % bolus 500 mL (0 mLs Intravenous Stopped 03/11/16 2307)     Initial Impression / Assessment and Plan / ED Course  I have reviewed the triage vital signs and the nursing notes.  Pertinent labs & imaging results that were available during my care of the patient were reviewed by me and considered in my medical decision making (see chart for details).    Pt presents with flu-liks sxs that started 2 days ago. Daughter reports pt with significant weakness today. Pt's husband was admitted yesterday and dx with flu. Hx of of complete heart block with pacemaker, Afib (on coumadin), hypertension, seizures, Parkinson's, CVA and breast cancer (s/p mastectomy and radiation). VSS. Exam revealed dry mucous membranes, lungs CTAB, no abdominal tenderness. Pt given IVF. Labs showed positive influenza A, UTI consistent with UTI. Lactic acid 1.84. Remaining labs unremarkable. CXR negative. Pt given antibiotics for UTI and tamiflu. On reevaluation, pt with continued weakness and denies any improvement in sxs, pt unable to stand up from the bed due to weakness. Discussed pt with Dr. Laverta [place]. Plan to admit pt for IVF, antibiotics and  symptomatic tx for influenza and UTI. Consulted hospitalist. Dr. Hal Hope agrees to admission. Discussed results and plan for admission with pt and family.  Final Clinical Impressions(s) / ED Diagnoses   Final diagnoses:  Influenza  Urinary tract infection without hematuria, site unspecified    New Prescriptions New Prescriptions   No medications on file     Nona Dell, PA-C 03/11/16 [place], MD 03/12/16 1137

## 2016-03-11 NOTE — ED Notes (Signed)
Pt wants something for pain. PT also want Reg meds that she takes at home.

## 2016-03-11 NOTE — ED Triage Notes (Signed)
Pt arrived via [facility] from home c/o flu like symptoms, denies fever, husband tested positive for flu.  Non productive cough. Daughter at bedside.

## 2016-03-12 ENCOUNTER — Encounter (HOSPITAL_COMMUNITY): Payer: Self-pay | Admitting: Internal Medicine

## 2016-03-12 DIAGNOSIS — E782 Mixed hyperlipidemia: Secondary | ICD-10-CM

## 2016-03-12 DIAGNOSIS — Z8673 Personal history of transient ischemic attack (TIA), and cerebral infarction without residual deficits: Secondary | ICD-10-CM

## 2016-03-12 DIAGNOSIS — E876 Hypokalemia: Secondary | ICD-10-CM

## 2016-03-12 DIAGNOSIS — J101 Influenza due to other identified influenza virus with other respiratory manifestations: Secondary | ICD-10-CM | POA: Diagnosis present

## 2016-03-12 DIAGNOSIS — J111 Influenza due to unidentified influenza virus with other respiratory manifestations: Secondary | ICD-10-CM | POA: Diagnosis present

## 2016-03-12 DIAGNOSIS — N390 Urinary tract infection, site not specified: Secondary | ICD-10-CM | POA: Diagnosis present

## 2016-03-12 DIAGNOSIS — C50211 Malignant neoplasm of upper-inner quadrant of right female breast: Secondary | ICD-10-CM

## 2016-03-12 DIAGNOSIS — I481 Persistent atrial fibrillation: Secondary | ICD-10-CM

## 2016-03-12 DIAGNOSIS — R531 Weakness: Secondary | ICD-10-CM

## 2016-03-12 DIAGNOSIS — N3000 Acute cystitis without hematuria: Secondary | ICD-10-CM

## 2016-03-12 DIAGNOSIS — M6281 Muscle weakness (generalized): Secondary | ICD-10-CM

## 2016-03-12 DIAGNOSIS — G9340 Encephalopathy, unspecified: Secondary | ICD-10-CM | POA: Diagnosis present

## 2016-03-12 DIAGNOSIS — I10 Essential (primary) hypertension: Secondary | ICD-10-CM

## 2016-03-12 DIAGNOSIS — Z950 Presence of cardiac pacemaker: Secondary | ICD-10-CM

## 2016-03-12 LAB — CBC
HCT: 36.3 % (ref 36.0–46.0)
Hemoglobin: 11.6 g/dL — ABNORMAL LOW (ref 12.0–15.0)
MCH: 28.9 pg (ref 26.0–34.0)
MCHC: 32 g/dL (ref 30.0–36.0)
MCV: 90.5 fL (ref 78.0–100.0)
Platelets: 120 10*3/uL — ABNORMAL LOW (ref 150–400)
RBC: 4.01 MIL/uL (ref 3.87–5.11)
RDW: 13.8 % (ref 11.5–15.5)
WBC: 5.9 10*3/uL (ref 4.0–10.5)

## 2016-03-12 LAB — BASIC METABOLIC PANEL
Anion gap: 12 (ref 5–15)
BUN: 14 mg/dL (ref 6–20)
CO2: 27 mmol/L (ref 22–32)
Calcium: 8.9 mg/dL (ref 8.9–10.3)
Chloride: 99 mmol/L — ABNORMAL LOW (ref 101–111)
Creatinine, Ser: 0.88 mg/dL (ref 0.44–1.00)
GFR calc Af Amer: >60 mL/min (ref >60)
GFR calc non Af Amer: 58 mL/min — ABNORMAL LOW (ref >60)
Glucose, Bld: 113 mg/dL — ABNORMAL HIGH (ref 65–99)
Potassium: 3.4 mmol/L — ABNORMAL LOW (ref 3.5–5.1)
Sodium: 138 mmol/L (ref 135–145)

## 2016-03-12 LAB — PROTIME-INR
INR: 1.41
Prothrombin Time: 17.4 seconds — ABNORMAL HIGH (ref 11.4–15.2)

## 2016-03-12 MED ORDER — LEVETIRACETAM 250 MG PO TABS
250.0000 mg | ORAL_TABLET | Freq: Two times a day (BID) | ORAL | Status: DC
  Administered 2016-03-12 – 2016-03-16 (×10): 250 mg via ORAL
  Filled 2016-03-12 (×10): qty 1

## 2016-03-12 MED ORDER — PROPRANOLOL HCL 20 MG PO TABS
30.0000 mg | ORAL_TABLET | Freq: Two times a day (BID) | ORAL | Status: DC
  Administered 2016-03-12 – 2016-03-16 (×10): 30 mg via ORAL
  Filled 2016-03-12 (×10): qty 1

## 2016-03-12 MED ORDER — CALCIUM CARBONATE-VITAMIN D 500-200 MG-UNIT PO TABS
1.0000 | ORAL_TABLET | Freq: Every day | ORAL | Status: DC
  Administered 2016-03-12 – 2016-03-16 (×5): 1 via ORAL
  Filled 2016-03-12 (×5): qty 1

## 2016-03-12 MED ORDER — SODIUM CHLORIDE 0.9 % IV SOLN
INTRAVENOUS | Status: AC
  Administered 2016-03-12: 02:00:00 via INTRAVENOUS

## 2016-03-12 MED ORDER — ACETAMINOPHEN 650 MG RE SUPP
650.0000 mg | Freq: Four times a day (QID) | RECTAL | Status: DC | PRN
Start: 2016-03-12 — End: 2016-03-16

## 2016-03-12 MED ORDER — OSELTAMIVIR PHOSPHATE 30 MG PO CAPS
30.0000 mg | ORAL_CAPSULE | Freq: Two times a day (BID) | ORAL | Status: AC
  Administered 2016-03-12 – 2016-03-16 (×9): 30 mg via ORAL
  Filled 2016-03-12 (×9): qty 1

## 2016-03-12 MED ORDER — WARFARIN - PHARMACIST DOSING INPATIENT
Freq: Every day | Status: DC
  Administered 2016-03-12: 18:00:00

## 2016-03-12 MED ORDER — COLESTIPOL HCL 1 G PO TABS
1.0000 g | ORAL_TABLET | Freq: Every day | ORAL | Status: DC
  Administered 2016-03-12 – 2016-03-15 (×5): 1 g via ORAL
  Filled 2016-03-12 (×5): qty 1

## 2016-03-12 MED ORDER — ACETAMINOPHEN 325 MG PO TABS
650.0000 mg | ORAL_TABLET | Freq: Four times a day (QID) | ORAL | Status: DC | PRN
  Administered 2016-03-15: 650 mg via ORAL
  Filled 2016-03-12: qty 2

## 2016-03-12 MED ORDER — OMEGA-3-ACID ETHYL ESTERS 1 G PO CAPS
1.0000 g | ORAL_CAPSULE | Freq: Two times a day (BID) | ORAL | Status: DC
  Administered 2016-03-12 – 2016-03-16 (×9): 1 g via ORAL
  Filled 2016-03-12 (×9): qty 1

## 2016-03-12 MED ORDER — PRAVASTATIN SODIUM 40 MG PO TABS
80.0000 mg | ORAL_TABLET | Freq: Every day | ORAL | Status: DC
  Administered 2016-03-12 – 2016-03-15 (×4): 80 mg via ORAL
  Filled 2016-03-12 (×4): qty 2

## 2016-03-12 MED ORDER — FISH OIL 1000 MG PO CAPS: 1.0000 | ORAL_CAPSULE | Freq: Two times a day (BID) | ORAL | Status: DC

## 2016-03-12 MED ORDER — ANASTROZOLE 1 MG PO TABS
1.0000 mg | ORAL_TABLET | Freq: Every day | ORAL | Status: DC
  Administered 2016-03-12 – 2016-03-16 (×5): 1 mg via ORAL
  Filled 2016-03-12 (×5): qty 1

## 2016-03-12 MED ORDER — POTASSIUM CHLORIDE CRYS ER 20 MEQ PO TBCR
40.0000 meq | EXTENDED_RELEASE_TABLET | Freq: Once | ORAL | Status: AC
  Administered 2016-03-12: 40 meq via ORAL
  Filled 2016-03-12: qty 2

## 2016-03-12 MED ORDER — LORATADINE 10 MG PO TABS
10.0000 mg | ORAL_TABLET | Freq: Every day | ORAL | Status: DC
Start: 2016-03-12 — End: 2016-03-16
  Administered 2016-03-12 – 2016-03-16 (×5): 10 mg via ORAL
  Filled 2016-03-12 (×5): qty 1

## 2016-03-12 MED ORDER — PANTOPRAZOLE SODIUM 40 MG PO TBEC
40.0000 mg | DELAYED_RELEASE_TABLET | Freq: Every day | ORAL | Status: DC
  Administered 2016-03-12 – 2016-03-16 (×5): 40 mg via ORAL
  Filled 2016-03-12 (×5): qty 1

## 2016-03-12 MED ORDER — WARFARIN SODIUM 4 MG PO TABS
4.0000 mg | ORAL_TABLET | Freq: Once | ORAL | Status: AC
  Administered 2016-03-12: 4 mg via ORAL
  Filled 2016-03-12: qty 1

## 2016-03-12 MED ORDER — ONDANSETRON HCL 4 MG PO TABS: 4.0000 mg | ORAL_TABLET | Freq: Four times a day (QID) | ORAL | Status: DC | PRN

## 2016-03-12 MED ORDER — ONDANSETRON HCL 4 MG/2ML IJ SOLN: 4.0000 mg | Freq: Four times a day (QID) | INTRAMUSCULAR | Status: DC | PRN

## 2016-03-12 NOTE — ED Notes (Signed)
Report attempted 

## 2016-03-12 NOTE — Progress Notes (Signed)
ANTICOAGULATION/ANTI-INFECTIVE CONSULT NOTE - Initial Consult  Pharmacy Consult for Coumadin, Tamiflu, and Rocephin Indication: atrial fibrillation, influenza A, and UTI  No Known Allergies   Vital Signs: Temp: 98.9 F (37.2 C) (02/08 2314) Temp Source: Oral (02/08 2314) BP: 116/60 (02/09 0045) Pulse Rate: 80 (02/09 0045)  Labs:  Recent Labs  03/11/16 1843  HGB 12.4  HCT 38.0  PLT 123*  CREATININE 0.95    Estimated Creatinine Clearance: 40.3 mL/min (by C-G formula based on SCr of 0.95 mg/dL).   Medical History: Past Medical History:  Diagnosis Date  . Arthritis    . Atrial fibrillation ([place])    a. s/p AVN ablation  . Cancer [facility]) 12/2010   breast with recurrence s/p recent XRT  . Complete heart block (HCC)    a. s/p STJ CRTP  . Compression fracture        . CVA 05/07/2008      . Fibrocystic breast   . GERD (gastroesophageal reflux disease)   . Glaucoma   . Hyperlipidemia   . Hypertension   . Orthostatic hypotension   . Overactive bladder   . Parkinsonism ([place])   . Pericarditis       . 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 Seasonal allergic rhinitis    Pos Skin Test 07-05-07  . Seizures ([place]) 2013      . Thoracic aneurysm without mention of rupture     Assessment: 83yo female c/o flu-like sx after husband tested positive for flu, flu panel positive for influenza A, UA also abnormal, to begin Tamiflu and Rocephin.  Also to continue Coumadin for Afib during admission; last dose of Coumadin taken 2/7 PTA but no current INR results available.  Goal of Therapy:  INR 2-3   Plan:  Rec'd Tamiflu 75mg  and Rocephin 1g in ED. Will continue Tamiflu with renal-adjusted 30mg  BID x5d.  Will continue Rocephin 1g IV Q24H.  Will monitor INR and resume/adjust Coumadin as appropriate.  Wynona Neat, PharmD, BCPS  03/12/2016,12:59 AM

## 2016-03-12 NOTE — H&P (Addendum)
History and Physical    Debra Lynn [id number] DOB: 1928/12/31 DOA: 03/11/2016  PCP: Aretta Nip, MD  Patient coming from: Home.  Chief Complaint: Weakness fever chills and cough.  HPI: Debra Lynn is a 83 y.o. female with history of atrial fibrillation, complete heart block status post pacemaker placement, history of stroke, history of breast cancer presents to the ER because of generalized weakness difficult to ambulate fever chills and productive cough. Patient has been having these symptoms for last 24 hours. Patient husband was recently diagnosed with pneumonia. Patient's daughter also has similar symptoms. In the ER chest x-ray does not show anything acute. Influenza PCR came positive. UA also shows features consistent with UTI. Since patient is feeling very weak to ambulate patient will be admitted for hydration and treatment of Flu.   ED Course: Patient was started on Tamiflu. Ceftriaxone for UTI.  Review of Systems: As per HPI, rest all negative.   Past Medical History:  Diagnosis Date  . Arthritis    . Atrial fibrillation ([place])    a. s/p AVN ablation  . Cancer [facility]) 12/2010   breast with recurrence s/p recent XRT  . Complete heart block (HCC)    a. s/p STJ CRTP  . Compression fracture        . CVA 05/07/2008      . Fibrocystic breast   . GERD (gastroesophageal reflux disease)   . Glaucoma   . Hyperlipidemia   . Hypertension   . Orthostatic hypotension   . Overactive bladder   . Parkinsonism ([place])   . Pericarditis       . 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 Seasonal allergic rhinitis    Pos Skin Test 07-05-07  . Seizures ([place]) 2013      . Thoracic aneurysm without mention of rupture     Past Surgical History:  Procedure Laterality Date  . ABDOMINAL HYSTERECTOMY  1975   pt. unsure if laparascopic/vaginal/abdominal  . ABLATION  1980s   AVN ablation   . APPENDECTOMY    . BI-VENTRICULAR PACEMAKER UPGRADE  01/2007   3rd  device, upgraded to SJM Frontier II BiV pacemaker by Dr Leonia Reeves  . BREAST SURGERY Right 01/15/11   mastectomy  . CATARACT EXTRACTION Bilateral   . CHOLECYSTECTOMY    . EP IMPLANTABLE DEVICE N/A 06/27/2014   CRT-P  ([facility]) gen change by Dr Rayann Heman  . EYE SURGERY     For glaucoma  . FLEXIBLE BRONCHOSCOPY W/ UPPER ENDOSCOPY     and swallowing evaluation  . KNEE ARTHROSCOPY Right    Arthroscopic for torn meniscus  . MASS EXCISION Right 01/19/2013   Procedure: EXCISION CHEST WALL MASS;  Surgeon: [name], MD;  Location: [facility];  Service: General;  Laterality: Right;  . PACEMAKER INSERTION     1992; gen change 2004  . VESICOVAGINAL FISTULA CLOSURE W/ TAH       reports that she has never smoked. She has never used smokeless tobacco. She reports that she does not drink alcohol or use drugs.  No Known Allergies  Family History  Problem Relation Age of Onset  . Heart failure Mother   . Cancer Father     brain  . Asthma Brother   . Asthma Daughter   . Heart disease    . Cancer    . Lymphoma Brother   . Diabetes Brother   . Hypertension Brother   . Heart attack Brother  Prior to Admission medications   Medication Sig Start Date End Date Taking? Authorizing Provider  acetaminophen (TYLENOL) 500 MG tablet Take 1,000 mg by mouth every 6 (six) hours as needed for moderate pain (pain).    Yes Historical Provider, MD  anastrozole (ARIMIDEX) 1 MG tablet Take 1 tablet by mouth  daily 01/13/15  Yes Chauncey Cruel, MD  benzonatate (TESSALON) 100 MG capsule TAKE 1 CAPSULE BY MOUTH 3TIMES A DAY AS NEEDED FORCOUGH 12/09/15  Yes Deneise Lever, MD  Calcium Carbonate-Vitamin D (CALCIUM-VITAMIN D) 500-200 MG-UNIT per tablet Take 1 tablet by mouth daily.    Yes Historical Provider, MD  cephALEXin (KEFLEX) 250 MG capsule Take 1 capsule by mouth at bedtime.  05/25/11  Yes Historical Provider, MD  cetirizine (ZYRTEC) 10 MG tablet Take 10 mg by mouth at bedtime as needed for allergies or  rhinitis.    Yes Historical Provider, MD  colestipol (COLESTID) 1 G tablet Take 1 g by mouth at bedtime.   Yes Historical Provider, MD  furosemide (LASIX) 40 MG tablet TAKE 1 TABLET BY MOUTH  EVERY MORNING 03/11/16  Yes Jettie Booze, MD  levETIRAcetam (KEPPRA) 500 MG tablet Take 250 mg by mouth every 12 (twelve) hours.   Yes Historical Provider, MD  Multiple Vitamin (MULTIVITAMIN WITH MINERALS) TABS tablet Take 1 tablet by mouth daily.   Yes Historical Provider, MD  Omega-3 Fatty Acids (FISH OIL) 1000 MG CAPS Take 1 capsule by mouth 2 (two) times daily.   Yes Historical Provider, MD  pantoprazole (PROTONIX) 40 MG tablet Take 1 tablet by mouth daily. 10/02/10  Yes Historical Provider, MD  potassium chloride SA (K-DUR,KLOR-CON) 20 MEQ tablet TAKE 1 TABLET BY MOUTH  DAILY Patient taking differently: TAKE 1 TABLET BY MOUTH  DAILY IN EVENING 12/08/15  Yes Jettie Booze, MD  pravastatin (PRAVACHOL) 80 MG tablet TAKE 1 TABLET BY MOUTH  DAILY Patient taking differently: TAKE 1 TABLET BY MOUTH  DAILY IN EVENING 03/11/16  Yes Jettie Booze, MD  Probiotic Product (ALIGN) 4 MG CAPS Take 4 mg by mouth daily. 01/20/13  Yes [name], MD  propranolol (INDERAL) 60 MG tablet TAKE ONE-HALF TABLET BY  MOUTH TWICE A DAY 03/11/16  Yes Jettie Booze, MD  warfarin (COUMADIN) 2.5 MG tablet Take 1/2-1 tablet by mouth daily as directed by Coumadin Clini Patient taking differently: Take 1.25-2.5 mg by mouth daily at 6 PM. TAKES 1 TAB ON MON, WED AND FRI  TAKES 1/2 TAB ALL OTHER DAYS 07/29/15  Yes Jettie Booze, MD    Physical Exam: Vitals:   03/11/16 2245 03/11/16 2300 03/11/16 2314 03/12/16 0000  BP: 124/60 132/60  123/60  Pulse: 80 85  81  Resp: (!) 28 (!) 29  24  Temp:   98.9 F (37.2 C)   TempSrc:   Oral   SpO2: 99% 97%  100%      Constitutional: Moderately built and nourished. Vitals:   03/11/16 2245 03/11/16 2300 03/11/16 2314 03/12/16 0000  BP: 124/60 132/60  123/60    Pulse: 80 85  81  Resp: (!) 28 (!) 29  24  Temp:   98.9 F (37.2 C)   TempSrc:   Oral   SpO2: 99% 97%  100%   Eyes: Anicteric. no pallor. ENMT: No discharge from the ears eyes nose and mouth. Neck: No mass felt. No JVD appreciated. Respiratory: No rhonchi or crepitations. Cardiovascular: S1-S2 heard no murmurs appreciated. Abdomen: Soft nontender bowel sounds present. Musculoskeletal:  No edema. No joint effusion. Skin: No rash. Skin appears warm. Neurologic: Alert awake oriented to time place and person. Moves all extremities. Psychiatric: Appears normal. Normal affect.   Labs on Admission: I have personally reviewed following labs and imaging studies  CBC:  Recent Labs Lab 03/11/16 1843  WBC 7.5  NEUTROABS 5.7  HGB 12.4  HCT 38.0  MCV 89.8  PLT [id number]   Basic Metabolic Panel:  Recent Labs Lab 03/11/16 1843  NA 138  K 3.7  CL 99*  CO2 28  GLUCOSE 139*  BUN 17  CREATININE 0.95  CALCIUM 9.9   GFR: CrCl cannot be calculated (Unknown ideal weight.). Liver Function Tests: No results for input(s): AST, ALT, ALKPHOS, BILITOT, PROT, ALBUMIN in the last 168 hours. No results for input(s): LIPASE, AMYLASE in the last 168 hours. No results for input(s): AMMONIA in the last 168 hours. Coagulation Profile: No results for input(s): INR, PROTIME in the last 168 hours. Cardiac Enzymes: No results for input(s): CKTOTAL, CKMB, CKMBINDEX, TROPONINI in the last 168 hours. BNP (last 3 results) No results for input(s): PROBNP in the last 8760 hours. HbA1C: No results for input(s): HGBA1C in the last 72 hours. CBG: No results for input(s): GLUCAP in the last 168 hours. Lipid Profile: No results for input(s): CHOL, HDL, LDLCALC, TRIG, CHOLHDL, LDLDIRECT in the last 72 hours. Thyroid Function Tests: No results for input(s): TSH, T4TOTAL, FREET4, T3FREE, THYROIDAB in the last 72 hours. Anemia Panel: No results for input(s): VITAMINB12, FOLATE, FERRITIN, TIBC, IRON,  RETICCTPCT in the last 72 hours. Urine analysis:    Component Value Date/Time   COLORURINE YELLOW 03/11/2016 2126   APPEARANCEUR CLOUDY (A) 03/11/2016 2126   LABSPEC 1.021 03/11/2016 2126   PHURINE 5.0 03/11/2016 2126   GLUCOSEU NEGATIVE 03/11/2016 2126   HGBUR NEGATIVE 03/11/2016 2126   BILIRUBINUR NEGATIVE 03/11/2016 2126   KETONESUR NEGATIVE 03/11/2016 2126   PROTEINUR 100 (A) 03/11/2016 2126   UROBILINOGEN 0.2 12/14/2013 1608   NITRITE NEGATIVE 03/11/2016 2126   LEUKOCYTESUR LARGE (A) 03/11/2016 2126   Sepsis Labs: @LABRCNTIP (procalcitonin:4,lacticidven:4) )No results found for this or any previous visit (from the past 240 hour(s)).   Radiological Exams on Admission: Dg Chest 2 View  Result Date: 03/11/2016 CLINICAL DATA:  Flu like symptoms nonproductive cough EXAM: CHEST  2 VIEW COMPARISON:  10/15/2015 FINDINGS: Multi lead right-sided pacing device similar compared to prior. No focal consolidation or pleural effusion. Stable cardiomediastinal silhouette with prominent left pericardial fat pad. Atherosclerosis of the aorta. No pneumothorax. IMPRESSION: No active cardiopulmonary disease. Electronically Signed   By: Donavan Foil M.D.   On: 03/11/2016 19:17    EKG: Independently reviewed. Paced rhythm.  Assessment/Plan Principal Problem:   Weakness Active Problems:   Primary cancer of upper inner quadrant of right female breast ([place])   Pacemaker   HTN (hypertension)   Atrial fibrillation (HCC)   Influenza A   UTI (urinary tract infection)   Flu    1. Generalized weakness from deconditioning secondary to UTI and influenza - will hold Lasix and gently hydrate for the next 12 hours. Get physical therapy consult. Continue Tamiflu and ceftriaxone. Follow urine cultures. 2. Chronic atrial fibrillation - chads 2 vasc score is more than 2. Patient is on propranolol and Coumadin. Coumadin will be dosed per pharmacy. 3. History of stroke on statins and Coumadin. 4. Complete heart  block status post pacemaker placement. 5. History of seizures on Keppra. 6. Thrombocytopenia - follow CBC.   DVT prophylaxis: Coumadin. Code  Status: Full code.  Family Communication: Patient's daughter.  Disposition Plan: Home.  Consults called: Physical therapy.  Admission status: Observation.    Rise Patience MD [facility] Pager [phone].  If 7PM-7AM, please contact night-coverage [email or web address] Password [email or web address]  03/12/2016, 12:36 AM

## 2016-03-12 NOTE — NC FL2 (Signed)
[place] MEDICAID FL2 LEVEL OF CARE SCREENING TOOL     IDENTIFICATION  Patient Name: Debra Lynn Birthdate: [birth year] Sex: female Admission Date (Current Location): 03/11/2016  [facility] and [place] Number:  Herbalist and Address:  [facility] [place], [place] [address] [place],  [address]      Provider Number: [id number]  Attending Physician Name and Address:  Venetia Maxon Rama, MD  Relative Name and Phone Number:  [name],     Current Level of Care: SNF Recommended Level of Care: [place] Prior Approval Number:    Date Approved/Denied:   PASRR Number: [id number]  Discharge Plan: SNF    Current Diagnoses: Patient Active Problem List   Diagnosis Date Noted  . Influenza A 03/12/2016  . UTI (urinary tract infection) 03/12/2016  . Acute encephalopathy 03/12/2016  . History of stroke 03/12/2016  . Hypokalemia 03/12/2016  . Muscle weakness (generalized)   . Weakness 03/11/2016  . Lung nodules 12/01/2014  . Resting head tremor 01/20/2013  . Parkinsonism ([place])   . Orthostatic hypotension   . Chest wall recurrence of breast cancer ([place]) 01/19/2013  . Atrial fibrillation ([place]) 11/08/2012  . Complete heart block ([place]) 08/23/2012  . Pacemaker 08/23/2012  . HTN (hypertension) 08/23/2012  . Hyperlipidemia 08/23/2012  . Primary cancer of upper inner quadrant of right female breast ([place]) 12/30/2010  . Seasonal and perennial allergic rhinitis 04/02/2010  . G E R D 07/02/2007  . RHINOSINUSITIS, CHRONIC 05/18/2007    Orientation RESPIRATION BLADDER Height & Weight     Self, Time, Situation, Place  Normal Continent Weight: 155 lb 6.8 oz (70.5 kg) Height:  5\' 2"  (157.5 cm)  BEHAVIORAL SYMPTOMS/MOOD NEUROLOGICAL BOWEL NUTRITION STATUS      Continent Diet (Diet Heart )  AMBULATORY STATUS COMMUNICATION OF NEEDS Skin   Limited Assist Verbally Normal                       Personal Care Assistance Level of  Assistance  Bathing, Feeding, Dressing Bathing Assistance: Independent Feeding assistance: Independent Dressing Assistance: Limited assistance     Functional Limitations Info  Sight, Hearing, Speech Sight Info: Adequate Hearing Info: Adequate Speech Info: Adequate    SPECIAL CARE FACTORS FREQUENCY  PT (By licensed PT), OT (By licensed OT)     PT Frequency: 5x week OT Frequency: 5x week            Contractures Contractures Info: Not present    Additional Factors Info  Code Status Code Status Info: Full Code             Current Medications (03/12/2016):  This is the current hospital active medication list Current Facility-Administered Medications  Medication Dose Route Frequency Provider Last Rate Last Dose  . acetaminophen (TYLENOL) tablet 650 mg  650 mg Oral Q6H PRN Rise Patience, MD       Or  . acetaminophen (TYLENOL) suppository 650 mg  650 mg Rectal Q6H PRN Rise Patience, MD      . anastrozole (ARIMIDEX) tablet 1 mg  1 mg Oral Daily Rise Patience, MD   1 mg at 03/12/16 1020  . calcium-vitamin D (OSCAL WITH D) 500-200 MG-UNIT per tablet 1 tablet  1 tablet Oral Daily Rise Patience, MD   1 tablet at 03/12/16 1017  . cefTRIAXone (ROCEPHIN) 1 g in dextrose 5 % 50 mL IVPB  1 g Intravenous Q24H Nona Dell, [place]  Stopped at 03/12/16 0038  . colestipol (COLESTID) tablet 1 g  1 g Oral QHS Rise Patience, MD   1 g at 03/12/16 0139  . levETIRAcetam (KEPPRA) tablet 250 mg  250 mg Oral Q12H Rise Patience, MD   250 mg at 03/12/16 1020  . loratadine (CLARITIN) tablet 10 mg  10 mg Oral Daily Rise Patience, MD   10 mg at 03/12/16 1017  . omega-3 acid ethyl esters (LOVAZA) capsule 1 g  1 g Oral BID Rise Patience, MD   1 g at 03/12/16 1017  . ondansetron (ZOFRAN) tablet 4 mg  4 mg Oral Q6H PRN Rise Patience, MD       Or  . ondansetron [facility]) injection 4 mg  4 mg Intravenous Q6H PRN Rise Patience, MD      .  oseltamivir (TAMIFLU) capsule 30 mg  30 mg Oral BID Laren Everts, RPH   30 mg at 03/12/16 1021  . pantoprazole (PROTONIX) EC tablet 40 mg  40 mg Oral Daily Rise Patience, MD   40 mg at 03/12/16 1017  . pravastatin (PRAVACHOL) tablet 80 mg  80 mg Oral q1800 Rise Patience, MD      . propranolol (INDERAL) tablet 30 mg  30 mg Oral BID Rise Patience, MD   30 mg at 03/12/16 1021  . warfarin (COUMADIN) tablet 4 mg  4 mg Oral ONCE-1800 Romona Curls, [facility]      . Warfarin - Pharmacist Dosing Inpatient   Does not apply [place], [facility]         Discharge Medications: Please see discharge summary for a list of discharge medications.  Relevant Imaging Results:  Relevant Lab Results:   Additional Information SS#[id number]  Wende Neighbors, LCSW

## 2016-03-12 NOTE — Progress Notes (Signed)
[place] for warfarin Indication: atrial fibrillation   Assessment: 25 yof on warfarin PTA for afib. Pharmacy consulted to dose inpatient (missed dose 2/8). INR subtherapeutic 1.41 on admit. Hg down 11.6, plt down to 120, no bleed documented.  PTA warfarin dose:  2.5mg  on MWF, 1.25mg  AOD (last dose PTA 2/7) - therapeutic on this regimen on 02/03/16 outpatient visit  Goal of Therapy:  INR 2-3 Monitor platelets by anticoagulation protocol: Yes   Plan:  Warfarin 4mg  PO x 1 dose Daily INR Monitor CBC, s/sx bleeding   Elicia Lamp, PharmD, BCPS Clinical Pharmacist 03/12/2016 9:52 AM

## 2016-03-12 NOTE — Clinical Social Work Placement (Signed)
   CLINICAL SOCIAL WORK PLACEMENT  NOTE  Date:  03/12/2016  Patient Details  Name: Debra Lynn MRN: [id number] Date of Birth: [birth year]  Clinical Social Work is seeking post-discharge placement for this patient at the Portal level of care (*CSW will initial, date and re-position this form in  chart as items are completed):  Yes   Patient/family provided with [place] Work Department's list of facilities offering this level of care within the geographic area requested by the patient (or if unable, by the patient's family).  Yes   Patient/family informed of their freedom to choose among providers that offer the needed level of care, that participate in Medicare, Medicaid or managed care program needed by the patient, have an available bed and are willing to accept the patient.  Yes   Patient/family informed of [place]'s ownership interest in [facility] and [facility], as well as of the fact that they are under no obligation to receive care at these facilities.  PASRR submitted to EDS on 03/12/16     PASRR number received on 03/12/16     Existing PASRR number confirmed on       FL2 transmitted to all facilities in geographic area requested by pt/family on       FL2 transmitted to all facilities within larger geographic area on       Patient informed that his/her managed care company has contracts with or will negotiate with certain facilities, including the following:            Patient/family informed of bed offers received.  Patient chooses bed at       Physician recommends and patient chooses bed at      Patient to be transferred to   on  .  Patient to be transferred to facility by       Patient family notified on   of transfer.  Name of family member notified:        PHYSICIAN Please sign FL2     Additional Comment:    _______________________________________________ Wende Neighbors, LCSW 03/12/2016, 4:23 PM

## 2016-03-12 NOTE — Clinical Social Work Note (Signed)
Clinical Social Work Assessment  Patient Details  Name: Debra Lynn MRN: [id number] Date of Birth: January 24, 1929  Date of referral:  03/12/16               Reason for consult:  Discharge Planning                Permission sought to share information with:  Family Supports Permission granted to share information::  Yes, Verbal Permission Granted  Name::     [name]  Agency::     Relationship::  Daughter  Contact Information:  [phone]  Housing/Transportation Living arrangements for the past 2 months:  Single Family Home Source of Information:  Patient Patient Interpreter Needed:  None Criminal Activity/Legal Involvement Pertinent to Current Situation/Hospitalization:  No - Comment as needed Significant Relationships:  Adult Children, Spouse Lives with:  Self, Spouse Do you feel safe going back to the place where you live?  Yes Need for family participation in patient care:  No (Coment)  Care giving concerns: Patients husband is currently admitted as inpatient at [facility]   Social Worker assessment / plan: Clinical Social Worker met patient at bedside to offer support and discuss patients needs at discharge. Patient stated she does not really want to go to SNF and would prefer to go home. CSW stated to patient that both her and spouse are sick and they only have the support of their daughter [name]. Patient stated she will talk to daughter to discuss placement. Patient was agreeable for CSW to fax out SNF placement to facility in community. CSW to follow up with patient once bed offers are available. CSW remains available for support and to facilitate patient discharge needs once medically ready.   Employment status:  Retired Forensic scientist:  Medicare PT Recommendations:    Information / Referral to community resources:  [place]  Patient/Family's Response to care:  Patient verbalized appreciation and understanding for CSW role and  involvement in care. Patient agreeable with current discharge plan to SNF once she speaks to daughter.   Patient/Family's Understanding of and Emotional Response to Diagnosis, Current Treatment, and Prognosis:  Patient with good understanding of current medical state and limitations around most recent hospitalization. Patient agreeable with SNF placement in hopes of transitioning back home.   Emotional Assessment Appearance:  Appears stated age Attitude/Demeanor/Rapport:  Other Affect (typically observed):  Calm Orientation:  Oriented to Self, Oriented to Place, Oriented to  Time, Oriented to Situation Alcohol / Substance use:  Not Applicable Psych involvement (Current and /or in the community):  No (Comment)  Discharge Needs  Concerns to be addressed:  No discharge needs identified Readmission within the last 30 days:  No Current discharge risk:  None Barriers to Discharge:  No Barriers Identified   Wende Neighbors, LCSW 03/12/2016, 4:08 PM

## 2016-03-12 NOTE — Evaluation (Signed)
Physical Therapy Evaluation Patient Details Name: Debra Lynn MRN: [id number] DOB: [birth year] Today's Date: 03/12/2016   History of Present Illness  Debra Lynn is a 83 y.o. female with history of atrial fibrillation, complete heart block status post pacemaker placement, history of stroke, history of breast cancer presents to the ER because of generalized weakness difficult to ambulate fever chills and productive cough. Patient has been having these symptoms for last 24 hours. Patient husband was recently diagnosed with pneumonia.  Influenza PCR came positive. UA also shows features consistent with UTI.   Clinical Impression  Pt admitted with above diagnosis. Pt currently with functional limitations due to the deficits listed below (see PT Problem List).  Pt will benefit from skilled PT to increase their independence and safety with mobility to allow discharge to the venue listed below.  Pt with significant weakness compared to her PLOF.  Pt also with increased confusion and slower processing and understanding of deficits per daughter.  Pt's husband is currently in the hospital and daughter cannot provide 24 hour care.  Pt is not safe to d/c alone and recommend SNF as safest d/c option at this time.  Will continue to assess progress of mobility in acute care.     Follow Up Recommendations SNF;Supervision for mobility/OOB    Equipment Recommendations  None recommended by PT    Recommendations for Other Services OT consult     Precautions / Restrictions Precautions Precautions: Fall Restrictions Weight Bearing Restrictions: No      Mobility  Bed Mobility Overal bed mobility: Needs Assistance Bed Mobility: Supine to Sit     Supine to sit: Mod assist     General bed mobility comments: Cueing for technique and use of bed pad to get hips turned  Transfers Overall transfer level: Needs assistance Equipment used: Rolling walker (2 wheeled) Transfers: Sit to/from [employer] Sit to Stand: Mod assist Stand pivot transfers: Min assist       General transfer comment: MOD A to power up and MIN A for SPT bed > BSC> recliner with RW.  Decreased safety with SPT, trying to let go of RW too early and needing cues for foot placement.  Pt reports being very fatigued from transfer  Ambulation/Gait             General Gait Details: SPT only  Stairs            Wheelchair Mobility    Modified Rankin (Stroke Patients Only)       Balance Overall balance assessment: Needs assistance Sitting-balance support: Feet supported Sitting balance-Leahy Scale: Fair       Standing balance-Leahy Scale: Poor Standing balance comment: requires UE support                             Pertinent Vitals/Pain Pain Assessment: No/denies pain    Home Living Family/patient expects to be discharged to:: Private residence Living Arrangements: Spouse/significant other   Type of Home: House Home Access: Stairs to enter   [employer] of Steps: 2 Home Layout: Two level;Able to live on main level with bedroom/bathroom Home Equipment: Gilford Rile - 2 wheels;Shower seat Additional Comments: Spoke with daughter in hallway after session and she states pt's husband helps pt with some ADLs.  He is in hospital.    Prior Function Level of Independence: Independent with assistive device(s)         Comments: Independent with RW  Hand Dominance        Extremity/Trunk Assessment   Upper Extremity Assessment Upper Extremity Assessment: Generalized weakness    Lower Extremity Assessment Lower Extremity Assessment: Generalized weakness       Communication   Communication: HOH  Cognition Arousal/Alertness: Awake/alert Behavior During Therapy: WFL for tasks assessed/performed Overall Cognitive Status: Impaired/Different from baseline Area of Impairment: Safety/judgement;Problem solving;Awareness       Following Commands: Follows  one step commands with increased time;Follows multi-step commands inconsistently Safety/Judgement: Decreased awareness of safety;Decreased awareness of deficits Awareness: Emergent Problem Solving: Slow processing;Difficulty sequencing;Requires verbal cues;Requires tactile cues General Comments: Pt unaware she had been incontinent in bed. Transferred to [facility] and sat down and urinated and had BM with underwear on and was not aware.  (underwear low on buttocks and PT did not see when in front of pt assisting with transfer)    General Comments      Exercises     Assessment/Plan    PT Assessment Patient needs continued PT services  PT Problem List Decreased strength;Decreased activity tolerance;Decreased balance;Decreased mobility;Decreased cognition;Decreased knowledge of use of DME;Decreased safety awareness          PT Treatment Interventions DME instruction;Gait training;Functional mobility training;Balance training;Therapeutic exercise;Therapeutic activities;Patient/family education    PT Goals (Current goals can be found in the Care Plan section)  Acute Rehab PT Goals Patient Stated Goal: For pt to get stronger at rehab before home PT Goal Formulation: With family Time For Goal Achievement: 03/26/16 Potential to Achieve Goals: Good    Frequency Min 3X/week   Barriers to discharge Decreased caregiver support      Co-evaluation               End of Session Equipment Utilized During Treatment: Gait belt Activity Tolerance: Patient limited by fatigue Patient left: in chair;with call bell/phone within reach;with chair alarm set Nurse Communication: Mobility status (nurse tech)    Functional Limitation: Mobility: Walking and moving around Mobility: Walking and Moving Around Current Status [phone]): At least 20 percent but less than 40 percent impaired, limited or restricted Mobility: Walking and Moving Around Goal Status [phone]): At least 1 percent but less than 20  percent impaired, limited or restricted    Time: 0857-0933 PT Time Calculation (min) (ACUTE ONLY): 36 min   Charges:   PT Evaluation $PT Eval Moderate Complexity: 1 Procedure PT Treatments $Therapeutic Activity: 8-22 mins   PT G Codes:   PT G-Codes **NOT FOR INPATIENT CLASS** Functional Limitation: Mobility: Walking and moving around Mobility: Walking and Moving Around Current Status [phone]): At least 20 percent but less than 40 percent impaired, limited or restricted Mobility: Walking and Moving Around Goal Status [phone]): At least 1 percent but less than 20 percent impaired, limited or restricted    Dashawn Golda LUBECK 03/12/2016, 11:00 AM

## 2016-03-12 NOTE — Progress Notes (Addendum)
Progress Note    Debra Lynn  [id number] DOB: [birth year]  DOA: 03/11/2016 PCP: Aretta Nip, MD    Brief Narrative:   Chief complaint: F/U weakness  Debra Lynn is an 83 y.o. female with history of atrial fibrillation, complete heart block status post pacemaker placement, history of stroke, history of breast cancer who was admitted 03/11/16 to evaluate complaints of generalized weakness with difficulty ambulating, fever, chills and productive cough.  Assessment/Plan:   Principal Problem:   Weakness secondary to UTI and influenza A / acute encephalopathy Secondary to either influenza, UTI, or both. Urinalysis consistent with possible infection, cultures pending. Influenza screening positive for influenza A. Continue Tamiflu and Rocephin. Follow-up urine cultures. The patient is extremely weak and mildly encephalopathic this morning. Could barely get up with physical therapy. Physical therapist recommending SNF placement. Her husband is also in the hospital with influenza infection.  Active Problems:   Primary cancer of upper inner quadrant of right female breast (HCC) Continue Arimidex.    Pacemaker secondary to history of complete heart block    HTN (hypertension) Blood pressure controlled.    Atrial fibrillation ([place]) On chronic anticoagulation with Coumadin. Rate controlled on propranolol. 12-lead EKG reviewed. Paced rhythm.    History of stroke/hyperlipidemia Continue risk factor modification with blood pressure, cholesterol control.    History of seizures Continue Keppra.    Thrombocytopenia Monitor platelet count.    Hypokalemia We'll give 40 mEq of oral potassium replacement today.  Family Communication/Anticipated D/C date and plan/Code Status   DVT prophylaxis: Coumadin ordered. Code Status: Full Code.  Family Communication: No family at the bedside. Disposition Plan: Possibly will need SNF. Await urine cultures.   Medical Consultants:     None.   Procedures:    None  Anti-Infectives:    Rocephin 03/11/16--->  Tamiflu 03/11/16--->  Subjective:   The patient appears to be mildly encephalopathic. She soiled her bed and then, when up with physical therapy, forgot that she was wearing underwear and soiled her underclothes after being put on the bedside commode. She was very weak. No frank shortness of breath or cough. No complaints of pain.  Objective:    Vitals:   03/12/16 0100 03/12/16 0127 03/12/16 0140 03/12/16 0622  BP:  (!) 126/51 (!) 126/51 (!) 129/53  Pulse:  82 82 74  Resp:  (!) 23 (!) 21 (!) 21  Temp:  99.3 F (37.4 C) 99.3 F (37.4 C) 98.7 F (37.1 C)  TempSrc:  Oral Oral Oral  SpO2:  95% 95% 95%  Weight: 68.6 kg (151 lb 3.8 oz)  70.5 kg (155 lb 6.8 oz) 70.5 kg (155 lb 6.8 oz)  Height: 5\' 3"  (1.6 m)  5\' 2"  (1.575 m)     Intake/Output Summary (Last 24 hours) at 03/12/16 0829 Last data filed at 03/12/16 0038  Gross per 24 hour  Intake              550 ml  Output                0 ml  Net              550 ml   Filed Weights   03/12/16 0100 03/12/16 0140 03/12/16 0622  Weight: 68.6 kg (151 lb 3.8 oz) 70.5 kg (155 lb 6.8 oz) 70.5 kg (155 lb 6.8 oz)    Exam: General exam: Appears Mildly confused and weak. Respiratory system: Clear to auscultation. Respiratory effort increased with tachypnea.  Cardiovascular system: S1 & S2 heard, RRR. No JVD,  rubs, gallops or clicks. No murmurs. Gastrointestinal system: Abdomen is nondistended, soft and nontender. No organomegaly or masses felt. Normal bowel sounds heard. Central nervous system: Mildly confused with mental status changes noted. No focal neurological deficits. Extremities: No clubbing,  or cyanosis. Trace edema. Skin: No rashes, lesions or ulcers. Psychiatry: Judgement and insight appear impaired. Mood & affect flat.   Data Reviewed:   I have personally reviewed following labs and imaging studies:  Labs: Basic Metabolic Panel:  Recent  Labs Lab 03/11/16 1843 03/12/16 0308  NA 138 138  K 3.7 3.4*  CL 99* 99*  CO2 28 27  GLUCOSE 139* 113*  BUN 17 14  CREATININE 0.95 0.88  CALCIUM 9.9 8.9   GFR Estimated Creatinine Clearance: 43 mL/min (by C-G formula based on SCr of 0.88 mg/dL). Liver Function Tests: No results for input(s): AST, ALT, ALKPHOS, BILITOT, PROT, ALBUMIN in the last 168 hours. No results for input(s): LIPASE, AMYLASE in the last 168 hours. No results for input(s): AMMONIA in the last 168 hours. Coagulation profile  Recent Labs Lab 03/12/16 0308  INR 1.41    CBC:  Recent Labs Lab 03/11/16 1843 03/12/16 0308  WBC 7.5 5.9  NEUTROABS 5.7  --   HGB 12.4 11.6*  HCT 38.0 36.3  MCV 89.8 90.5  PLT 123* 120*   Cardiac Enzymes: No results for input(s): CKTOTAL, CKMB, CKMBINDEX, TROPONINI in the last 168 hours. BNP (last 3 results) No results for input(s): PROBNP in the last 8760 hours. CBG: No results for input(s): GLUCAP in the last 168 hours. D-Dimer: No results for input(s): DDIMER in the last 72 hours. Hgb A1c: No results for input(s): HGBA1C in the last 72 hours. Lipid Profile: No results for input(s): CHOL, HDL, LDLCALC, TRIG, CHOLHDL, LDLDIRECT in the last 72 hours. Thyroid function studies: No results for input(s): TSH, T4TOTAL, T3FREE, THYROIDAB in the last 72 hours.  Invalid input(s): FREET3 Anemia work up: No results for input(s): VITAMINB12, FOLATE, FERRITIN, TIBC, IRON, RETICCTPCT in the last 72 hours. Sepsis Labs:  Recent Labs Lab 03/11/16 1843 03/11/16 1857 03/12/16 0308  WBC 7.5  --  5.9  LATICACIDVEN  --  1.84  --     Microbiology No results found for this or any previous visit (from the past 240 hour(s)).  Radiology: Dg Chest 2 View  Result Date: 03/11/2016 CLINICAL DATA:  Flu like symptoms nonproductive cough EXAM: CHEST  2 VIEW COMPARISON:  10/15/2015 FINDINGS: Multi lead right-sided pacing device similar compared to prior. No focal consolidation or  pleural effusion. Stable cardiomediastinal silhouette with prominent left pericardial fat pad. Atherosclerosis of the aorta. No pneumothorax. IMPRESSION: No active cardiopulmonary disease. Electronically Signed   By: Donavan Foil M.D.   On: 03/11/2016 19:17    Medications:   . anastrozole  1 mg Oral Daily  . calcium-vitamin D  1 tablet Oral Daily  . cefTRIAXone (ROCEPHIN)  IV  1 g Intravenous Q24H  . colestipol  1 g Oral QHS  . levETIRAcetam  250 mg Oral Q12H  . loratadine  10 mg Oral Daily  . omega-3 acid ethyl esters  1 g Oral BID  . oseltamivir  30 mg Oral BID  . pantoprazole  40 mg Oral Daily  . pravastatin  80 mg Oral q1800  . propranolol  30 mg Oral BID  . Warfarin - Pharmacist Dosing Inpatient   Does not apply q1800   Continuous Infusions: . sodium chloride  75 mL/hr at 03/12/16 0130    Medical decision making is of high complexity and this patient is at high risk of deterioration, therefore this is a level 3 visit.  (> 4 problem points, 3 data points, high risk)  Since the patient was admitted after midnight, no charge for today's visit.    LOS: 0 days   Venessa Wickham  [facility] Pager [phone]. If unable to reach me by pager, please call my cell phone at [phone].  *Please refer to [email or web address], password [email or web address] to get updated schedule on who will round on this patient, as hospitalists switch teams weekly. If 7PM-7AM, please contact night-coverage at [email or web address], password [email or web address] for any overnight needs.  03/12/2016, 8:29 AM

## 2016-03-12 NOTE — Care Management Obs Status (Signed)
[place] NOTIFICATION   Patient Details  Name: JOCELYNNE SHAMBACH MRN: [id number] Date of Birth: March 04, 1928   Medicare Observation Status Notification Given:  Yes    Dawayne Patricia, RN 03/12/2016, 2:24 PM

## 2016-03-13 DIAGNOSIS — Z9011 Acquired absence of right breast and nipple: Secondary | ICD-10-CM | POA: Diagnosis not present

## 2016-03-13 DIAGNOSIS — G40909 Epilepsy, unspecified, not intractable, without status epilepticus: Secondary | ICD-10-CM | POA: Diagnosis present

## 2016-03-13 DIAGNOSIS — Z923 Personal history of irradiation: Secondary | ICD-10-CM | POA: Diagnosis not present

## 2016-03-13 DIAGNOSIS — J101 Influenza due to other identified influenza virus with other respiratory manifestations: Secondary | ICD-10-CM | POA: Diagnosis present

## 2016-03-13 DIAGNOSIS — G20 Parkinson's disease: Secondary | ICD-10-CM | POA: Diagnosis present

## 2016-03-13 DIAGNOSIS — J9801 Acute bronchospasm: Secondary | ICD-10-CM | POA: Diagnosis present

## 2016-03-13 DIAGNOSIS — Z833 Family history of diabetes mellitus: Secondary | ICD-10-CM | POA: Diagnosis not present

## 2016-03-13 DIAGNOSIS — E785 Hyperlipidemia, unspecified: Secondary | ICD-10-CM | POA: Diagnosis present

## 2016-03-13 DIAGNOSIS — N390 Urinary tract infection, site not specified: Secondary | ICD-10-CM | POA: Diagnosis present

## 2016-03-13 DIAGNOSIS — K219 Gastro-esophageal reflux disease without esophagitis: Secondary | ICD-10-CM | POA: Diagnosis present

## 2016-03-13 DIAGNOSIS — I481 Persistent atrial fibrillation: Secondary | ICD-10-CM | POA: Diagnosis not present

## 2016-03-13 DIAGNOSIS — I10 Essential (primary) hypertension: Secondary | ICD-10-CM | POA: Diagnosis present

## 2016-03-13 DIAGNOSIS — Z8673 Personal history of transient ischemic attack (TIA), and cerebral infarction without residual deficits: Secondary | ICD-10-CM | POA: Diagnosis not present

## 2016-03-13 DIAGNOSIS — H409 Unspecified glaucoma: Secondary | ICD-10-CM | POA: Diagnosis present

## 2016-03-13 DIAGNOSIS — Z90710 Acquired absence of both cervix and uterus: Secondary | ICD-10-CM | POA: Diagnosis not present

## 2016-03-13 DIAGNOSIS — Z7901 Long term (current) use of anticoagulants: Secondary | ICD-10-CM | POA: Diagnosis not present

## 2016-03-13 DIAGNOSIS — Z8249 Family history of ischemic heart disease and other diseases of the circulatory system: Secondary | ICD-10-CM | POA: Diagnosis not present

## 2016-03-13 DIAGNOSIS — Z79811 Long term (current) use of aromatase inhibitors: Secondary | ICD-10-CM | POA: Diagnosis not present

## 2016-03-13 DIAGNOSIS — D696 Thrombocytopenia, unspecified: Secondary | ICD-10-CM | POA: Diagnosis present

## 2016-03-13 DIAGNOSIS — Z950 Presence of cardiac pacemaker: Secondary | ICD-10-CM | POA: Diagnosis not present

## 2016-03-13 DIAGNOSIS — J111 Influenza due to unidentified influenza virus with other respiratory manifestations: Secondary | ICD-10-CM | POA: Diagnosis not present

## 2016-03-13 DIAGNOSIS — Z853 Personal history of malignant neoplasm of breast: Secondary | ICD-10-CM | POA: Diagnosis not present

## 2016-03-13 DIAGNOSIS — R531 Weakness: Secondary | ICD-10-CM | POA: Diagnosis present

## 2016-03-13 DIAGNOSIS — E876 Hypokalemia: Secondary | ICD-10-CM | POA: Diagnosis present

## 2016-03-13 DIAGNOSIS — Z807 Family history of other malignant neoplasms of lymphoid, hematopoietic and related tissues: Secondary | ICD-10-CM | POA: Diagnosis not present

## 2016-03-13 DIAGNOSIS — I482 Chronic atrial fibrillation: Secondary | ICD-10-CM | POA: Diagnosis present

## 2016-03-13 DIAGNOSIS — G9340 Encephalopathy, unspecified: Secondary | ICD-10-CM | POA: Diagnosis present

## 2016-03-13 LAB — URINE CULTURE

## 2016-03-13 LAB — BASIC METABOLIC PANEL
Anion gap: 12 (ref 5–15)
BUN: 12 mg/dL (ref 6–20)
CO2: 24 mmol/L (ref 22–32)
Calcium: 8.7 mg/dL — ABNORMAL LOW (ref 8.9–10.3)
Chloride: 104 mmol/L (ref 101–111)
Creatinine, Ser: 0.71 mg/dL (ref 0.44–1.00)
GFR calc Af Amer: >60 mL/min (ref >60)
GFR calc non Af Amer: >60 mL/min (ref >60)
Glucose, Bld: 100 mg/dL — ABNORMAL HIGH (ref 65–99)
Potassium: 3.8 mmol/L (ref 3.5–5.1)
Sodium: 140 mmol/L (ref 135–145)

## 2016-03-13 LAB — PROTIME-INR
INR: 1.3
Prothrombin Time: 16.3 seconds — ABNORMAL HIGH (ref 11.4–15.2)

## 2016-03-13 MED ORDER — BENZONATATE 100 MG PO CAPS
100.0000 mg | ORAL_CAPSULE | Freq: Two times a day (BID) | ORAL | Status: DC | PRN
  Administered 2016-03-13 – 2016-03-14 (×3): 100 mg via ORAL
  Filled 2016-03-13 (×2): qty 1

## 2016-03-13 MED ORDER — WARFARIN SODIUM 5 MG PO TABS
5.0000 mg | ORAL_TABLET | Freq: Once | ORAL | Status: AC
  Administered 2016-03-13: 5 mg via ORAL
  Filled 2016-03-13: qty 1

## 2016-03-13 MED ORDER — WARFARIN SODIUM 4 MG PO TABS
4.0000 mg | ORAL_TABLET | Freq: Once | ORAL | Status: DC
Start: 2016-03-13 — End: 2016-03-13

## 2016-03-13 NOTE — Progress Notes (Addendum)
Progress Note    Debra Lynn  [id number] DOB: 11/05/1928  DOA: 03/11/2016 PCP: Aretta Nip, MD    Brief Narrative:   Chief complaint: F/U weakness  Debra Lynn is an 83 y.o. female with history of atrial fibrillation, complete heart block status post pacemaker placement, history of stroke, history of breast cancer who was admitted 03/11/16 to evaluate complaints of generalized weakness with difficulty ambulating, fever, chills and productive cough.  Assessment/Plan:   Principal Problem:   Weakness secondary to UTI and influenza A / acute encephalopathy Secondary to either influenza, UTI, or both. Chest x-ray negative for active cardiopulmonary disease. Urinalysis consistent with possible infection, unfortunately culture was never sent.We'll see if they can add it on to an old specimen if it is still available. Influenza screening positive for influenza A. Continue Tamiflu and Rocephin. The Physical therapist recommending SNF placement. Her husband is also in the hospital with influenza infection.  Active Problems:   Primary cancer of upper inner quadrant of right female breast (HCC) Continue Arimidex.    Pacemaker secondary to history of complete heart block    HTN (hypertension) Blood pressure controlled.    Atrial fibrillation ([place]) On chronic anticoagulation with Coumadin. Rate controlled on propranolol. 12-lead EKG reviewed. Paced rhythm.    History of stroke/hyperlipidemia Continue risk factor modification with blood pressure, cholesterol control.    History of seizures Continue Keppra.    Thrombocytopenia Monitor platelet count.    Hypokalemia Repleted.  Family Communication/Anticipated D/C date and plan/Code Status   DVT prophylaxis: Coumadin ordered. Code Status: Full Code.  Family Communication: Daughter updated at the bedside. Disposition Plan: Possibly will need SNF.    Medical Consultants:    None.   Procedures:     None  Anti-Infectives:    Rocephin 03/11/16--->  Tamiflu 03/11/16--->  Subjective:   The patient continues to be slow to respond and is too weak to stand safely. She is short of breath and her daughter reports that she has been wheezing.   Objective:    Vitals:   03/12/16 0622 03/12/16 1425 03/12/16 2036 03/13/16 0341  BP: (!) 129/53 (!) 112/47 (!) 114/56 140/66  Pulse: 74 69 77 70  Resp: (!) 21 18 18  (!) 24  Temp: 98.7 F (37.1 C) 98.4 F (36.9 C) 98.2 F (36.8 C) 98.4 F (36.9 C)  TempSrc: Oral Oral Oral Oral  SpO2: 95% 99% 96% 95%  Weight: 70.5 kg (155 lb 6.8 oz)   71.4 kg (157 lb 6.5 oz)  Height:        Intake/Output Summary (Last 24 hours) at 03/13/16 0755 Last data filed at 03/12/16 1820  Gross per 24 hour  Intake              480 ml  Output                0 ml  Net              480 ml   Filed Weights   03/12/16 0140 03/12/16 0622 03/13/16 0341  Weight: 70.5 kg (155 lb 6.8 oz) 70.5 kg (155 lb 6.8 oz) 71.4 kg (157 lb 6.5 oz)    Exam: General exam: Continues to appear confused and weak. Respiratory system: Upper airway congestion with wheezing in the upper chest, no wheeze in the lung fields. Respiratory effort increased with tachypnea. Cardiovascular system: S1 & S2 heard, RRR. No JVD,  rubs, gallops or clicks. No murmurs. Gastrointestinal system: Abdomen  is nondistended, soft and nontender. No organomegaly or masses felt. Normal bowel sounds heard. Central nervous system: Mildly confused with mental status changes noted. No focal neurological deficits. Extremities: No clubbing,  or cyanosis. Trace edema. Skin: No rashes, lesions or ulcers. Psychiatry: Judgement and insight appear impaired. Mood & affect flat.   Data Reviewed:   I have personally reviewed following labs and imaging studies:  Labs: Basic Metabolic Panel:  Recent Labs Lab 03/11/16 1843 03/12/16 0308 03/13/16 0345  NA 138 138 140  K 3.7 3.4* 3.8  CL 99* 99* 104  CO2 28 27 24    GLUCOSE 139* 113* 100*  BUN 17 14 12   CREATININE 0.95 0.88 0.71  CALCIUM 9.9 8.9 8.7*   GFR Estimated Creatinine Clearance: 47.6 mL/min (by C-G formula based on SCr of 0.71 mg/dL). Liver Function Tests: No results for input(s): AST, ALT, ALKPHOS, BILITOT, PROT, ALBUMIN in the last 168 hours. No results for input(s): LIPASE, AMYLASE in the last 168 hours. No results for input(s): AMMONIA in the last 168 hours. Coagulation profile  Recent Labs Lab 03/12/16 0308 03/13/16 0345  INR 1.41 1.30    CBC:  Recent Labs Lab 03/11/16 1843 03/12/16 0308  WBC 7.5 5.9  NEUTROABS 5.7  --   HGB 12.4 11.6*  HCT 38.0 36.3  MCV 89.8 90.5  PLT 123* 120*   Cardiac Enzymes: No results for input(s): CKTOTAL, CKMB, CKMBINDEX, TROPONINI in the last 168 hours. BNP (last 3 results) No results for input(s): PROBNP in the last 8760 hours. CBG: No results for input(s): GLUCAP in the last 168 hours. D-Dimer: No results for input(s): DDIMER in the last 72 hours. Hgb A1c: No results for input(s): HGBA1C in the last 72 hours. Lipid Profile: No results for input(s): CHOL, HDL, LDLCALC, TRIG, CHOLHDL, LDLDIRECT in the last 72 hours. Thyroid function studies: No results for input(s): TSH, T4TOTAL, T3FREE, THYROIDAB in the last 72 hours.  Invalid input(s): FREET3 Anemia work up: No results for input(s): VITAMINB12, FOLATE, FERRITIN, TIBC, IRON, RETICCTPCT in the last 72 hours. Sepsis Labs:  Recent Labs Lab 03/11/16 1843 03/11/16 1857 03/12/16 0308  WBC 7.5  --  5.9  LATICACIDVEN  --  1.84  --     Microbiology No results found for this or any previous visit (from the past 240 hour(s)).  Radiology: Dg Chest 2 View  Result Date: 03/11/2016 CLINICAL DATA:  Flu like symptoms nonproductive cough EXAM: CHEST  2 VIEW COMPARISON:  10/15/2015 FINDINGS: Multi lead right-sided pacing device similar compared to prior. No focal consolidation or pleural effusion. Stable cardiomediastinal silhouette  with prominent left pericardial fat pad. Atherosclerosis of the aorta. No pneumothorax. IMPRESSION: No active cardiopulmonary disease. Electronically Signed   By: Donavan Foil M.D.   On: 03/11/2016 19:17    Medications:   . anastrozole  1 mg Oral Daily  . calcium-vitamin D  1 tablet Oral Daily  . cefTRIAXone (ROCEPHIN)  IV  1 g Intravenous Q24H  . colestipol  1 g Oral QHS  . levETIRAcetam  250 mg Oral Q12H  . loratadine  10 mg Oral Daily  . omega-3 acid ethyl esters  1 g Oral BID  . oseltamivir  30 mg Oral BID  . pantoprazole  40 mg Oral Daily  . pravastatin  80 mg Oral q1800  . propranolol  30 mg Oral BID  . Warfarin - Pharmacist Dosing Inpatient   Does not apply q1800   Continuous Infusions:   Medical decision making is of high complexity  and this patient is at high risk of deterioration, therefore this is a level 3 visit.  (> 4 problem points, 2 data points, high risk due to mental status changes)    LOS: 0 days   Saisha Hogue  [facility] Pager [phone]. If unable to reach me by pager, please call my cell phone at [phone].  *Please refer to [email or web address], password [email or web address] to get updated schedule on who will round on this patient, as hospitalists switch teams weekly. If 7PM-7AM, please contact night-coverage at [email or web address], password [email or web address] for any overnight needs.  03/13/2016, 7:55 AM

## 2016-03-13 NOTE — Progress Notes (Signed)
[place] for warfarin Indication: atrial fibrillation   Assessment: 24 yof on warfarin PTA for afib. Pharmacy consulted to dose inpatient (missed dose 2/8). INR subtherapeutic 1.41 on admit, down to 1.3, likely due to missed dose. Hg down 11.6, plt down to 120, no bleed documented.  PTA warfarin dose:  2.5mg  on MWF, 1.25mg  AOD (last dose PTA 2/7) - therapeutic on this regimen on 02/03/16 outpatient visit  Goal of Therapy:  INR 2-3 Monitor platelets by anticoagulation protocol: Yes   Plan:  Warfarin 5mg  PO x 1 dose Daily INR Monitor CBC, s/sx bleeding   Elicia Lamp, PharmD, BCPS Clinical Pharmacist 03/13/2016 11:16 AM

## 2016-03-14 LAB — CBC
HCT: 34.4 % — ABNORMAL LOW (ref 36.0–46.0)
Hemoglobin: 11.1 g/dL — ABNORMAL LOW (ref 12.0–15.0)
MCH: 29.1 pg (ref 26.0–34.0)
MCHC: 32.3 g/dL (ref 30.0–36.0)
MCV: 90.3 fL (ref 78.0–100.0)
Platelets: 93 10*3/uL — ABNORMAL LOW (ref 150–400)
RBC: 3.81 MIL/uL — ABNORMAL LOW (ref 3.87–5.11)
RDW: 13.8 % (ref 11.5–15.5)
WBC: 5.6 10*3/uL (ref 4.0–10.5)

## 2016-03-14 LAB — BASIC METABOLIC PANEL
Anion gap: 12 (ref 5–15)
BUN: 8 mg/dL (ref 6–20)
CO2: 21 mmol/L — ABNORMAL LOW (ref 22–32)
Calcium: 8.6 mg/dL — ABNORMAL LOW (ref 8.9–10.3)
Chloride: 104 mmol/L (ref 101–111)
Creatinine, Ser: 0.74 mg/dL (ref 0.44–1.00)
GFR calc Af Amer: >60 mL/min (ref >60)
GFR calc non Af Amer: >60 mL/min (ref >60)
Glucose, Bld: 96 mg/dL (ref 65–99)
Potassium: 3.5 mmol/L (ref 3.5–5.1)
Sodium: 137 mmol/L (ref 135–145)

## 2016-03-14 LAB — PROTIME-INR
INR: 1.37
Prothrombin Time: 16.9 seconds — ABNORMAL HIGH (ref 11.4–15.2)

## 2016-03-14 MED ORDER — WARFARIN SODIUM 5 MG PO TABS
5.0000 mg | ORAL_TABLET | Freq: Once | ORAL | Status: AC
  Administered 2016-03-14: 5 mg via ORAL
  Filled 2016-03-14: qty 1

## 2016-03-14 NOTE — Progress Notes (Addendum)
[place] for warfarin Indication: atrial fibrillation   Assessment: 66 yof on warfarin PTA for afib. Pharmacy consulted to dose inpatient. INR subtherapeutic 1.41 on admit, trended down (likely due to missed dose 2/8) - now back up to 1.37. Hg low stable, plt down to 93, no bleed documented.  PTA warfarin dose:  2.5mg  on MWF, 1.25mg  AOD (last dose PTA 2/7) - therapeutic on this regimen on 02/03/16 outpatient visit  Goal of Therapy:  INR 2-3 Monitor platelets by anticoagulation protocol: Yes   Plan:  Warfarin 5mg  PO x 1 dose Daily INR Monitor CBC trend closely, s/sx bleeding   Elicia Lamp, PharmD, BCPS Clinical Pharmacist 03/14/2016 11:07 AM

## 2016-03-14 NOTE — Progress Notes (Signed)
Progress Note    Debra Lynn  [id number] DOB: 07-May-1928  DOA: 03/11/2016 PCP: Aretta Nip, MD    Brief Narrative:   Chief complaint: F/U weakness  Debra Lynn is an 83 y.o. female with history of atrial fibrillation, complete heart block status post pacemaker placement, history of stroke, history of breast cancer who was admitted 03/11/16 to evaluate complaints of generalized weakness with difficulty ambulating, fever, chills and productive cough.  Assessment/Plan:   Principal Problem:   Weakness secondary to UTI and influenza A / acute encephalopathy Secondary to either influenza, UTI, or both. Chest x-ray negative for active cardiopulmonary disease. Urinalysis consistent with possible infection, urine culture polymicrobial. Influenza screening positive for influenza A. Continue Tamiflu and Rocephin. The Physical therapist recommending SNF placement. Mental status improving, less confused.  Active Problems:   Primary cancer of upper inner quadrant of right female breast (HCC) Continue Arimidex.    Pacemaker secondary to history of complete heart block    HTN (hypertension) Blood pressure controlled.    Atrial fibrillation ([place]) On chronic anticoagulation with Coumadin. Rate controlled on propranolol. 12-lead EKG reviewed. Paced rhythm.    History of stroke/hyperlipidemia Continue risk factor modification with blood pressure, cholesterol control.    History of seizures Continue Keppra.    Thrombocytopenia Monitor platelet count.    Hypokalemia Repleted.  Family Communication/Anticipated D/C date and plan/Code Status   DVT prophylaxis: Coumadin ordered. Code Status: Full Code.  Family Communication: Daughter updated at the bedside. Disposition Plan: Possibly will need SNF.    Medical Consultants:    None.   Procedures:    None  Anti-Infectives:    Rocephin 03/11/16--->  Tamiflu 03/11/16--->  Subjective:   The patient continues to  have some shortness of breath, occasional cough, no nausea or vomiting. No chest pain.   Objective:    Vitals:   03/12/16 2036 03/13/16 0341 03/13/16 2232 03/14/16 0430  BP: (!) 114/56 140/66 137/62 106/65  Pulse: 77 70 67   Resp: 18 (!) 24  20  Temp: 98.2 F (36.8 C) 98.4 F (36.9 C) 98.3 F (36.8 C) 98.2 F (36.8 C)  TempSrc: Oral Oral Oral Oral  SpO2: 96% 95% 96% 99%  Weight:  71.4 kg (157 lb 6.5 oz)  72.1 kg (158 lb 15.2 oz)  Height:       No intake or output data in the 24 hours ending 03/14/16 0727 Filed Weights   03/12/16 0622 03/13/16 0341 03/14/16 0430  Weight: 70.5 kg (155 lb 6.8 oz) 71.4 kg (157 lb 6.5 oz) 72.1 kg (158 lb 15.2 oz)    Exam: General exam: More alert, sitting up in chair. Respiratory system: Upper airway congestion improving, no wheeze in the lung fields. Respiratory effort increased with tachypnea. Cardiovascular system: S1 & S2 heard, RRR. No JVD,  rubs, gallops or clicks. No murmurs. Gastrointestinal system: Abdomen is nondistended, soft and nontender. No organomegaly or masses felt. Normal bowel sounds heard. Central nervous system: Less confused. No focal neurological deficits. Extremities: No clubbing,  or cyanosis. Trace edema. Skin: No rashes, lesions or ulcers. Psychiatry: Judgement and insight appear impaired. Mood & affect flat.   Data Reviewed:   I have personally reviewed following labs and imaging studies:  Labs: Basic Metabolic Panel:  Recent Labs Lab 03/11/16 1843 03/12/16 0308 03/13/16 0345 03/14/16 0241  NA 138 138 140 137  K 3.7 3.4* 3.8 3.5  CL 99* 99* 104 104  CO2 28 27 24  21*  GLUCOSE  139* 113* 100* 96  BUN 17 14 12 8   CREATININE 0.95 0.88 0.71 0.74  CALCIUM 9.9 8.9 8.7* 8.6*   GFR Estimated Creatinine Clearance: 47.8 mL/min (by C-G formula based on SCr of 0.74 mg/dL). Liver Function Tests: No results for input(s): AST, ALT, ALKPHOS, BILITOT, PROT, ALBUMIN in the last 168 hours. No results for input(s):  LIPASE, AMYLASE in the last 168 hours. No results for input(s): AMMONIA in the last 168 hours. Coagulation profile  Recent Labs Lab 03/12/16 0308 03/13/16 0345 03/14/16 0241  INR 1.41 1.30 1.37    CBC:  Recent Labs Lab 03/11/16 1843 03/12/16 0308 03/14/16 0241  WBC 7.5 5.9 5.6  NEUTROABS 5.7  --   --   HGB 12.4 11.6* 11.1*  HCT 38.0 36.3 34.4*  MCV 89.8 90.5 90.3  PLT 123* 120* PENDING   Sepsis Labs:  Recent Labs Lab 03/11/16 1843 03/11/16 1857 03/12/16 0308 03/14/16 0241  WBC 7.5  --  5.9 5.6  LATICACIDVEN  --  1.84  --   --     Microbiology Recent Results (from the past 240 hour(s))  Urine culture     Status: Abnormal   Collection Time: 03/11/16  9:26 PM  Result Value Ref Range Status   Specimen Description URINE, RANDOM  Final   Special Requests NONE  Final   Culture MULTIPLE SPECIES PRESENT, SUGGEST RECOLLECTION (A)  Final   Report Status 03/13/2016 FINAL  Final    Radiology: No results found.  Medications:   . anastrozole  1 mg Oral Daily  . calcium-vitamin D  1 tablet Oral Daily  . cefTRIAXone (ROCEPHIN)  IV  1 g Intravenous Q24H  . colestipol  1 g Oral QHS  . levETIRAcetam  250 mg Oral Q12H  . loratadine  10 mg Oral Daily  . omega-3 acid ethyl esters  1 g Oral BID  . oseltamivir  30 mg Oral BID  . pantoprazole  40 mg Oral Daily  . pravastatin  80 mg Oral q1800  . propranolol  30 mg Oral BID  . Warfarin - Pharmacist Dosing Inpatient   Does not apply q1800   Continuous Infusions:   Medical decision making is of high complexity and this patient is at high risk of deterioration, therefore this is a level 3 visit.  (> 4 problem points, 2 data points, Moderate risk)    LOS: 1 day   Shadow Stiggers  [facility] Pager [phone]. If unable to reach me by pager, please call my cell phone at [phone].  *Please refer to [email or web address], password [email or web address] to get updated schedule on who will round on this patient, as hospitalists switch teams  weekly. If 7PM-7AM, please contact night-coverage at [email or web address], password [email or web address] for any overnight needs.  03/14/2016, 7:27 AM

## 2016-03-15 DIAGNOSIS — B349 Viral infection, unspecified: Secondary | ICD-10-CM

## 2016-03-15 DIAGNOSIS — J9801 Acute bronchospasm: Secondary | ICD-10-CM

## 2016-03-15 LAB — PROTIME-INR
INR: 1.76
Prothrombin Time: 20.7 seconds — ABNORMAL HIGH (ref 11.4–15.2)

## 2016-03-15 MED ORDER — WARFARIN SODIUM 4 MG PO TABS
4.0000 mg | ORAL_TABLET | Freq: Once | ORAL | Status: AC
  Administered 2016-03-15: 4 mg via ORAL
  Filled 2016-03-15: qty 1

## 2016-03-15 MED ORDER — ALBUTEROL SULFATE (2.5 MG/3ML) 0.083% IN NEBU
2.5000 mg | INHALATION_SOLUTION | Freq: Four times a day (QID) | RESPIRATORY_TRACT | Status: DC
  Administered 2016-03-15 (×2): 2.5 mg via RESPIRATORY_TRACT
  Filled 2016-03-15 (×3): qty 3

## 2016-03-15 MED ORDER — PREDNISONE 20 MG PO TABS
40.0000 mg | ORAL_TABLET | Freq: Every day | ORAL | Status: DC
  Administered 2016-03-15 – 2016-03-16 (×2): 40 mg via ORAL
  Filled 2016-03-15 (×2): qty 2

## 2016-03-15 NOTE — Progress Notes (Signed)
Progress Note    Debra Lynn  [id number] DOB: 11-24-1928  DOA: 03/11/2016 PCP: Aretta Nip, MD    Brief Narrative:   Chief complaint: F/U weakness  Debra Lynn is an 83 y.o. female with history of atrial fibrillation, complete heart block status post pacemaker placement, history of stroke, history of breast cancer who was admitted 03/11/16 to evaluate complaints of generalized weakness with difficulty ambulating, fever, chills and productive cough.  Assessment/Plan:   Principal Problem:   Weakness secondary to UTI and influenza A / acute encephalopathy / bronchospasm Secondary to either influenza, UTI, or both. Chest x-ray negative for active cardiopulmonary disease. Urinalysis consistent with possible infection, urine culture polymicrobial. Influenza screening positive for influenza A. Continue Tamiflu and Rocephin. Continues to be confused, slightly worse today. Wheezing worse. We'll start albuterol and prednisone 40 mg daily.  Active Problems:   Primary cancer of upper inner quadrant of right female breast (HCC) Continue Arimidex.    Pacemaker secondary to history of complete heart block    HTN (hypertension) Blood pressure controlled.    Atrial fibrillation ([place]) On chronic anticoagulation with Coumadin. Rate controlled on propranolol. 12-lead EKG reviewed. Paced rhythm.    History of stroke/hyperlipidemia Continue risk factor modification with blood pressure, cholesterol control.    History of seizures Continue Keppra.    Thrombocytopenia Monitor platelet count.    Hypokalemia Repleted.  Family Communication/Anticipated D/C date and plan/Code Status   DVT prophylaxis: Coumadin ordered. Code Status: Full Code.  Family Communication: Daughter updated at the bedside2/11/18. Disposition Plan: SNF placement.    Medical Consultants:    None.   Procedures:    None  Anti-Infectives:    Rocephin 03/11/16--->  Tamiflu  03/11/16--->  Subjective:   The patient Seems a little worse today. She is more confused than she was yesterday. She tells me that she keeps forgetting that she has the flu. She is a little more breathless and reports an occasional cough as well as shortness of breath. Denies nausea, vomiting, or problems with her appetite.   Objective:    Vitals:   03/14/16 1323 03/15/16 0600 03/15/16 1022 03/15/16 1035  BP: (!) 109/50  137/74   Pulse: 62  70   Resp: 18     Temp: 98.1 F (36.7 C)     TempSrc: Oral     SpO2: 96%   98%  Weight:  68.6 kg (151 lb 3.8 oz)    Height:        Intake/Output Summary (Last 24 hours) at 03/15/16 1240 Last data filed at 03/15/16 0910  Gross per 24 hour  Intake              240 ml  Output              350 ml  Net             -110 ml   Filed Weights   03/13/16 0341 03/14/16 0430 03/15/16 0600  Weight: 71.4 kg (157 lb 6.5 oz) 72.1 kg (158 lb 15.2 oz) 68.6 kg (151 lb 3.8 oz)    Exam: General exam: More confused today. Respiratory system: Expiratory wheezes. Labored breathing with tachypnea. Cardiovascular system: S1 & S2 heard, RRR. No JVD,  rubs, gallops or clicks. No murmurs. Gastrointestinal system: Abdomen is nondistended, soft and nontender. No organomegaly or masses felt. Normal bowel sounds heard. Central nervous system: More confused. No focal neurological deficits. Extremities: No clubbing,  or cyanosis. Trace edema. Skin:  No rashes, lesions or ulcers. Psychiatry: Judgement and insight appear impaired. Mood & affect flat.   Data Reviewed:   I have personally reviewed following labs and imaging studies:  Labs: Basic Metabolic Panel:  Recent Labs Lab 03/11/16 1843 03/12/16 0308 03/13/16 0345 03/14/16 0241  NA 138 138 140 137  K 3.7 3.4* 3.8 3.5  CL 99* 99* 104 104  CO2 28 27 24  21*  GLUCOSE 139* 113* 100* 96  BUN 17 14 12 8   CREATININE 0.95 0.88 0.71 0.74  CALCIUM 9.9 8.9 8.7* 8.6*   GFR Estimated Creatinine Clearance: 46.7  mL/min (by C-G formula based on SCr of 0.74 mg/dL). Liver Function Tests: No results for input(s): AST, ALT, ALKPHOS, BILITOT, PROT, ALBUMIN in the last 168 hours. No results for input(s): LIPASE, AMYLASE in the last 168 hours. No results for input(s): AMMONIA in the last 168 hours. Coagulation profile  Recent Labs Lab 03/12/16 0308 03/13/16 0345 03/14/16 0241 03/15/16 0525  INR 1.41 1.30 1.37 1.76    CBC:  Recent Labs Lab 03/11/16 1843 03/12/16 0308 03/14/16 0241  WBC 7.5 5.9 5.6  NEUTROABS 5.7  --   --   HGB 12.4 11.6* 11.1*  HCT 38.0 36.3 34.4*  MCV 89.8 90.5 90.3  PLT 123* 120* 93*   Sepsis Labs:  Recent Labs Lab 03/11/16 1843 03/11/16 1857 03/12/16 0308 03/14/16 0241  WBC 7.5  --  5.9 5.6  LATICACIDVEN  --  1.84  --   --     Microbiology Recent Results (from the past 240 hour(s))  Urine culture     Status: Abnormal   Collection Time: 03/11/16  9:26 PM  Result Value Ref Range Status   Specimen Description URINE, RANDOM  Final   Special Requests NONE  Final   Culture MULTIPLE SPECIES PRESENT, SUGGEST RECOLLECTION (A)  Final   Report Status 03/13/2016 FINAL  Final    Radiology: No results found.  Medications:   . albuterol  2.5 mg Nebulization Q6H  . anastrozole  1 mg Oral Daily  . calcium-vitamin D  1 tablet Oral Daily  . cefTRIAXone (ROCEPHIN)  IV  1 g Intravenous Q24H  . colestipol  1 g Oral QHS  . levETIRAcetam  250 mg Oral Q12H  . loratadine  10 mg Oral Daily  . omega-3 acid ethyl esters  1 g Oral BID  . oseltamivir  30 mg Oral BID  . pantoprazole  40 mg Oral Daily  . pravastatin  80 mg Oral q1800  . [START ON 03/16/2016] predniSONE  40 mg Oral Q breakfast  . propranolol  30 mg Oral BID  . warfarin  4 mg Oral ONCE-1800  . Warfarin - Pharmacist Dosing Inpatient   Does not apply q1800   Continuous Infusions:   Medical decision making is of high complexity and this patient is at high risk of deterioration, therefore this is a level 3  visit.  (> 4 problem points, 0 data points, Moderate risk)    LOS: 2 days   Angelli Baruch  [facility] Pager [phone]. If unable to reach me by pager, please call my cell phone at [phone].  *Please refer to [email or web address], password [email or web address] to get updated schedule on who will round on this patient, as hospitalists switch teams weekly. If 7PM-7AM, please contact night-coverage at [email or web address], password [email or web address] for any overnight needs.  03/15/2016, 12:40 PM

## 2016-03-15 NOTE — Progress Notes (Signed)
Physical Therapy Treatment Patient Details Name: Debra Lynn MRN: [id number] DOB: 1929/01/24 Today's Date: 03/15/2016    History of Present Illness Debra Lynn is a 83 y.o. female with history of atrial fibrillation, complete heart block status post pacemaker placement, history of stroke, history of breast cancer presents to the ER because of generalized weakness difficult to ambulate fever chills and productive cough. Patient has been having these symptoms for last 24 hours. Patient husband was recently diagnosed with pneumonia.  Influenza PCR came positive. UA also shows features consistent with UTI.     PT Comments    Pt progressing well toward goals. Pt tolerated ambulation today. Pt requires less physical assist with all mobility, but continues to require max verbal cues for safety and due to decreased short-term memory. Will continue to follow.    Follow Up Recommendations  SNF;Supervision for mobility/OOB     Equipment Recommendations  None recommended by PT    Recommendations for Other Services       Precautions / Restrictions Precautions Precautions: Fall Restrictions Weight Bearing Restrictions: No    Mobility  Bed Mobility Overal bed mobility: Needs Assistance Bed Mobility: Supine to Sit     Supine to sit: Supervision     General bed mobility comments: cueing for technique and to turn hips; increased time  Transfers Overall transfer level: Needs assistance Equipment used: Rolling walker (2 wheeled) Transfers: Sit to/from Stand Sit to Stand: Min guard         General transfer comment: v/c for push up from bed, increased time, stood x 2 (from bed and from toilet)   Ambulation/Gait Ambulation/Gait assistance: Min guard Ambulation Distance (Feet): 100 Feet Assistive device: Rolling walker (2 wheeled) Gait Pattern/deviations: Step-to pattern;Decreased step length - right;Decreased step length - left;Decreased stride length;Trunk flexed;Wide base of  support Gait velocity: decreased Gait velocity interpretation: Below normal speed for age/gender General Gait Details: slow speed; v/c from continuous amb, upright posture and breathing technique; verbal cues for proper RW use   Stairs            Wheelchair Mobility    Modified Rankin (Stroke Patients Only)       Balance Overall balance assessment: Needs assistance Sitting-balance support: Feet supported;No upper extremity supported Sitting balance-Leahy Scale: Good Sitting balance - Comments: sat EOB x 2 min   Standing balance support: During functional activity;Bilateral upper extremity supported Standing balance-Leahy Scale: Fair Standing balance comment: able to stand without UE during handwashing without LOB                    Cognition Arousal/Alertness: Awake/alert Behavior During Therapy: WFL for tasks assessed/performed;Impulsive Overall Cognitive Status: Impaired/Different from baseline Area of Impairment: Memory;Safety/judgement     Memory: Decreased short-term memory Following Commands: Follows one step commands with increased time;Follows multi-step commands inconsistently Safety/Judgement: Decreased awareness of safety;Decreased awareness of deficits   Problem Solving: Slow processing;Decreased initiation;Difficulty sequencing;Requires verbal cues;Requires tactile cues General Comments: pt required repeated v/c to stay on task and for RW use    Exercises      General Comments General comments (skin integrity, edema, etc.): amb to bathroom where pt was able to pull down/up underwear and perform hygeine with use of grab bar for 1 UE       Pertinent Vitals/Pain Pain Assessment: No/denies pain    Home Living                      Prior Function  PT Goals (current goals can now be found in the care plan section) Progress towards PT goals: Progressing toward goals    Frequency    Min 3X/week      PT Plan Current  plan remains appropriate    Co-evaluation             End of Session Equipment Utilized During Treatment: Gait belt Activity Tolerance: Patient limited by fatigue Patient left: in chair;with call bell/phone within reach;with chair alarm set     Time: [id number] PT Time Calculation (min) (ACUTE ONLY): 27 min  Charges:  $Gait Training: 8-22 mins $Therapeutic Activity: 8-22 mins                    G CodesTracie Harrier 03-28-2016, 2:44 PM  Tracie Harrier, SPT Acute Rehab SPT [phone]

## 2016-03-15 NOTE — Progress Notes (Signed)
ANTICOAGULATION CONSULT NOTE - Follow Up Consult  Pharmacy Consult:  Coumadin Indication: atrial fibrillation  No Known Allergies  Patient Measurements: Height: 5\' 2"  (157.5 cm) Weight: 151 lb 3.8 oz (68.6 kg) IBW/kg (Calculated) : 50.1  Vital Signs:    Labs:  Recent Labs  03/13/16 0345 03/14/16 0241 03/15/16 0525  HGB  --  11.1*  --   HCT  --  34.4*  --   PLT  --  93*  --   LABPROT 16.3* 16.9* 20.7*  INR 1.30 1.37 1.76  CREATININE 0.71 0.74  --     Estimated Creatinine Clearance: 46.7 mL/min (by C-G formula based on SCr of 0.74 mg/dL).    Assessment: 33 YOF with history of Afib and CVA to continue on Coumadin.  Patient's INR is sub-therapeutic but trending up towards goal.  Her platelet count is decreasing, but no bleeding is reported.   Goal of Therapy:  INR 2-3    Plan:  - Coumadin 4mg  PO today - Daily PT / INR - Continue Tamiflu 30mg  PO BID.  Pharmacy will sign off as dosage adjustment is likely unnecessary and therapy will end tomorrow.  Thank you for the consult! - F/U CTX LOT    Louie Flenner D. Mina Marble, PharmD, BCPS Pager:  [phone] 03/15/2016, 8:34 AM

## 2016-03-16 LAB — PROTIME-INR
INR: 2.37
Prothrombin Time: 26.3 seconds — ABNORMAL HIGH (ref 11.4–15.2)

## 2016-03-16 MED ORDER — ALBUTEROL SULFATE (2.5 MG/3ML) 0.083% IN NEBU: 2.5000 mg | INHALATION_SOLUTION | Freq: Four times a day (QID) | RESPIRATORY_TRACT | Status: DC | PRN

## 2016-03-16 MED ORDER — WARFARIN SODIUM 2.5 MG PO TABS: 1.2500 mg | ORAL_TABLET | Freq: Once | ORAL | Status: DC

## 2016-03-16 MED ORDER — ALBUTEROL SULFATE (2.5 MG/3ML) 0.083% IN NEBU: 2.5000 mg | INHALATION_SOLUTION | Freq: Four times a day (QID) | RESPIRATORY_TRACT | 12 refills | Status: DC | PRN

## 2016-03-16 NOTE — Progress Notes (Signed)
Nurse has attempted  X2, to call report to [facility] for discharge summary no response. C.Tru Leopard RN

## 2016-03-16 NOTE — Care Management Note (Signed)
Case Management Note Marvetta Gibbons RN, BSN Unit 2W-Case Manager [phone]  Patient Details  Name: Debra Lynn MRN: [id number] Date of Birth: [birth year]  Subjective/Objective:   Pt admitted with weakness/flu                 Action/Plan: PTA pt lived at home with spouse- recommendations for STSNF- CSW consulted for placement needs-   Expected Discharge Date:  03/16/16               Expected Discharge Plan:  Skilled Nursing Facility  In-House Referral:  Clinical Social Work  Discharge planning Services  CM Consult  Post Acute Care Choice:    Choice offered to:     DME Arranged:    DME Agency:     HH Arranged:    [place] Agency:     Status of Service:  Completed, signed off  If discussed at [employer] [facility] [employer], dates discussed:    Discharge Disposition: skilled facility   Additional Comments:  03/16/16- 49- Alexica Schlossberg RN CM- pt for d/c today to SNF- CSW following for placement needs-   Dahlia Client, Romeo Rabon, RN 03/16/2016, 11:07 AM

## 2016-03-16 NOTE — Discharge Summary (Signed)
Physician Discharge Summary  Debra Lynn [id number] DOB: 1928/08/27 DOA: 03/11/2016  PCP: Aretta Nip, MD  Admit date: 03/11/2016 Discharge date: 03/16/2016  Admitted From: Home Discharge disposition: SNF   Recommendations for Outpatient Follow-Up:   1. Note: Lasix and KDur discontinued.  Can resume at your discretion when indicated. 2. Will need close F/U of PT/INR and adjustment of coumadin based on INR. 3. Re-check platelets in 1 week.   Discharge Diagnosis:   Principal Problem:    Weakness secondary to UTI and influenza A Active Problems:    Primary cancer of upper inner quadrant of right female breast ([place])    Pacemaker    HTN (hypertension)    Hyperlipidemia    Atrial fibrillation (HCC)    Influenza A    UTI (urinary tract infection)    Acute encephalopathy    History of stroke    Hypokalemia    Muscle weakness (generalized)    Flu    Acute bronchospasm due to viral infection   Discharge Condition: Improved.  Diet recommendation: Low sodium, heart healthy.    History of Present Illness:   Debra Lynn is an 83 y.o. female with history of atrial fibrillation, complete heart block status post pacemaker placement, history of stroke, history of breast cancer who was admitted 03/11/16 to evaluate complaints of generalized weakness with difficulty ambulating, fever, chills and productive cough.   Hospital Course by Problem:   Principal Problem:   Weakness secondary to UTI and influenza A / acute encephalopathy / bronchospasm Weakness is secondary to either influenza, UTI, or both. Chest x-ray negative for active cardiopulmonary disease. Urinalysis consistent with possible infection, urine culture polymicrobial. Influenza screening positive for influenza A. S/P treatment with Tamiflu and Rocephin. Wheezing improved at discharged after being given prednisone and albuterol PRN.  Active Problems:   Primary cancer of upper inner quadrant of  right female breast (HCC) Continue Arimidex.    Pacemaker secondary to history of complete heart block    HTN (hypertension) Resume Lasix at discharge.    Atrial fibrillation ([place]) On chronic anticoagulation with Coumadin. Rate controlled on propranolol. 12-lead EKG reviewed. Paced rhythm. INR therapeutic at discharge.    History of stroke/hyperlipidemia Continue risk factor modification with blood pressure, cholesterol control.    History of seizures Continue Keppra.    Thrombocytopenia Monitor platelet count. ? Secondary to viral infection.    Hypokalemia Repleted.   Medical Consultants:    None.   Discharge Exam:   Vitals:   03/15/16 1900 03/16/16 0508  BP: 139/62 (!) 162/74  Pulse: 73 83  Resp: 18 18  Temp: 97.6 F (36.4 C) 97.8 F (36.6 C)   Vitals:   03/15/16 1035 03/15/16 1334 03/15/16 1900 03/16/16 0508  BP:  (!) 152/72 139/62 (!) 162/74  Pulse:  66 73 83  Resp:  17 18 18   Temp:  97.3 F (36.3 C) 97.6 F (36.4 C) 97.8 F (36.6 C)  TempSrc:  Oral Oral Oral  SpO2: 98% 96% 96% 98%  Weight:    66.4 kg (146 lb 4.8 oz)  Height:        General exam: Appears calm and comfortable. Less confused. Respiratory system: Clear to auscultation, no wheeze. Respiratory effort normal. Cardiovascular system: S1 & S2 heard, RRR. No JVD,  rubs, gallops or clicks. No murmurs. Gastrointestinal system: Abdomen is nondistended, soft and nontender. No organomegaly or masses felt. Normal bowel sounds heard. Central nervous system: Alert and oriented x 2. No focal neurological  deficits. Extremities: No clubbing,  or cyanosis. Trace edema. Skin: No rashes, lesions or ulcers. Psychiatry: Judgement and insight appear impaired. Mood & affect appropriate.    The results of significant diagnostics from this hospitalization (including imaging, microbiology, ancillary and laboratory) are listed below for reference.     Procedures and Diagnostic Studies:   Dg Chest 2  View  Result Date: 03/11/2016 CLINICAL DATA:  Flu like symptoms nonproductive cough EXAM: CHEST  2 VIEW COMPARISON:  10/15/2015 FINDINGS: Multi lead right-sided pacing device similar compared to prior. No focal consolidation or pleural effusion. Stable cardiomediastinal silhouette with prominent left pericardial fat pad. Atherosclerosis of the aorta. No pneumothorax. IMPRESSION: No active cardiopulmonary disease. Electronically Signed   By: Donavan Foil M.D.   On: 03/11/2016 19:17     Labs:   Basic Metabolic Panel:  Recent Labs Lab 03/11/16 1843 03/12/16 0308 03/13/16 0345 03/14/16 0241  NA 138 138 140 137  K 3.7 3.4* 3.8 3.5  CL 99* 99* 104 104  CO2 28 27 24  21*  GLUCOSE 139* 113* 100* 96  BUN 17 14 12 8   CREATININE 0.95 0.88 0.71 0.74  CALCIUM 9.9 8.9 8.7* 8.6*   GFR Estimated Creatinine Clearance: 45.9 mL/min (by C-G formula based on SCr of 0.74 mg/dL). Liver Function Tests: No results for input(s): AST, ALT, ALKPHOS, BILITOT, PROT, ALBUMIN in the last 168 hours. No results for input(s): LIPASE, AMYLASE in the last 168 hours. No results for input(s): AMMONIA in the last 168 hours. Coagulation profile  Recent Labs Lab 03/12/16 0308 03/13/16 0345 03/14/16 0241 03/15/16 0525 03/16/16 0228  INR 1.41 1.30 1.37 1.76 2.37    CBC:  Recent Labs Lab 03/11/16 1843 03/12/16 0308 03/14/16 0241  WBC 7.5 5.9 5.6  NEUTROABS 5.7  --   --   HGB 12.4 11.6* 11.1*  HCT 38.0 36.3 34.4*  MCV 89.8 90.5 90.3  PLT 123* 120* 93*   Microbiology Recent Results (from the past 240 hour(s))  Urine culture     Status: Abnormal   Collection Time: 03/11/16  9:26 PM  Result Value Ref Range Status   Specimen Description URINE, RANDOM  Final   Special Requests NONE  Final   Culture MULTIPLE SPECIES PRESENT, SUGGEST RECOLLECTION (A)  Final   Report Status 03/13/2016 FINAL  Final     Discharge Instructions:   Discharge Instructions    Call MD for:  difficulty breathing, headache or  visual disturbances    Complete by:  As directed    Call MD for:  extreme fatigue    Complete by:  As directed    Call MD for:  persistant dizziness or light-headedness    Complete by:  As directed    Call MD for:  temperature >100.4    Complete by:  As directed    Diet - low sodium heart healthy    Complete by:  As directed    Increase activity slowly    Complete by:  As directed    Walk with assistance    Complete by:  As directed    Walker     Complete by:  As directed      Allergies as of 03/16/2016   No Known Allergies     Medication List    STOP taking these medications   cephALEXin 250 MG capsule Commonly known as:  KEFLEX     TAKE these medications   acetaminophen 500 MG tablet Commonly known as:  TYLENOL Take 1,000 mg by mouth  every 6 (six) hours as needed for moderate pain (pain).   albuterol (2.5 MG/3ML) 0.083% nebulizer solution Commonly known as:  PROVENTIL Take 3 mLs (2.5 mg total) by nebulization every 6 (six) hours as needed for wheezing or shortness of breath.   ALIGN 4 MG Caps Take 4 mg by mouth daily.   anastrozole 1 MG tablet Commonly known as:  ARIMIDEX Take 1 tablet by mouth  daily   benzonatate 100 MG capsule Commonly known as:  TESSALON TAKE 1 CAPSULE BY MOUTH 3TIMES A DAY AS NEEDED FORCOUGH   calcium-vitamin D 500-200 MG-UNIT tablet Take 1 tablet by mouth daily.   cetirizine 10 MG tablet Commonly known as:  ZYRTEC Take 10 mg by mouth at bedtime as needed for allergies or rhinitis.   colestipol 1 g tablet Commonly known as:  COLESTID Take 1 g by mouth at bedtime.   Fish Oil 1000 MG Caps Take 1 capsule by mouth 2 (two) times daily.   furosemide 40 MG tablet Commonly known as:  LASIX TAKE 1 TABLET BY MOUTH  EVERY MORNING   levETIRAcetam 500 MG tablet Commonly known as:  KEPPRA Take 250 mg by mouth every 12 (twelve) hours.   multivitamin with minerals Tabs tablet Take 1 tablet by mouth daily.   pantoprazole 40 MG  tablet Commonly known as:  PROTONIX Take 1 tablet by mouth daily.   potassium chloride SA 20 MEQ tablet Commonly known as:  K-DUR,KLOR-CON TAKE 1 TABLET BY MOUTH  DAILY What changed:  See the new instructions.   pravastatin 80 MG tablet Commonly known as:  PRAVACHOL TAKE 1 TABLET BY MOUTH  DAILY What changed:  See the new instructions.   propranolol 60 MG tablet Commonly known as:  INDERAL TAKE ONE-HALF TABLET BY  MOUTH TWICE A DAY   warfarin 2.5 MG tablet Commonly known as:  COUMADIN Take 1/2-1 tablet by mouth daily as directed by Coumadin Clini What changed:  how much to take  how to take this  when to take this  additional instructions       Contact information for follow-up providers    Aretta Nip, MD. Schedule an appointment as soon as possible for a visit in 2 week(s).   Specialty:  Family Medicine Why:  Hospital follow up. Contact information: [place] [address] [phone]            Contact information for after-discharge care    Destination    [facility] Follow up.   Specialty:  Skilled Nursing Facility Contact information: [place] [phone]                   Time coordinating discharge: 35 minutes.  Signed:  Joley Utecht  Pager [phone] [facility] 03/16/2016, 11:01 AM

## 2016-03-16 NOTE — Progress Notes (Signed)
No ICM remote transmission received for 03/16/2016 due to hospitalization and next ICM transmission scheduled for 03/25/2016.

## 2016-03-16 NOTE — Progress Notes (Addendum)
Clinical Social Worker facilitated patient discharge including contacting patient family and facility to confirm patient discharge plans.  Clinical information faxed to facility and family agreeable with plan. Patient daughter Hilda Blades stated she will drive patient to [facility] .  RN to call the nurse at [facility] [phone]) report prior to discharge.  Clinical Social Worker will sign off for now as social work intervention is no longer needed. Please consult [place] again if new need arises.  Rhea Pink, MSW, [place]

## 2016-03-16 NOTE — Progress Notes (Signed)
ANTICOAGULATION CONSULT NOTE - Follow Up Consult  Pharmacy Consult:  Coumadin Indication: atrial fibrillation  No Known Allergies  Patient Measurements: Height: 5\' 2"  (157.5 cm) Weight: 146 lb 4.8 oz (66.4 kg) IBW/kg (Calculated) : 50.1  Vital Signs: Temp: 97.8 F (36.6 C) (02/13 0508) Temp Source: Oral (02/13 0508) BP: 162/74 (02/13 0508) Pulse Rate: 83 (02/13 0508)  Labs:  Recent Labs  03/14/16 0241 03/15/16 0525 03/16/16 0228  HGB 11.1*  --   --   HCT 34.4*  --   --   PLT 93*  --   --   LABPROT 16.9* 20.7* 26.3*  INR 1.37 1.76 2.37  CREATININE 0.74  --   --     Estimated Creatinine Clearance: 45.9 mL/min (by C-G formula based on SCr of 0.74 mg/dL).    Assessment: 47 YOF with history of Afib and CVA to continue on Coumadin.  INR increased to therapeutic level today; no bleeding reported although she does have anemia and thrombocytopenia.   Goal of Therapy:  INR 2-3    Plan:  - Coumadin 1.25mg  PO today - Daily PT / INR - Consider adding stop date to Rocephin order (today is day #6 of therapy)    Aliahna Statzer D. Mina Marble, PharmD, BCPS Pager:  [phone] 03/16/2016, 7:49 AM

## 2016-03-17 ENCOUNTER — Encounter: Payer: Self-pay | Admitting: Adult Health

## 2016-03-17 ENCOUNTER — Non-Acute Institutional Stay (SKILLED_NURSING_FACILITY): Payer: Medicare Other | Admitting: Adult Health

## 2016-03-17 DIAGNOSIS — E876 Hypokalemia: Secondary | ICD-10-CM | POA: Diagnosis not present

## 2016-03-17 DIAGNOSIS — R5381 Other malaise: Secondary | ICD-10-CM

## 2016-03-17 DIAGNOSIS — E782 Mixed hyperlipidemia: Secondary | ICD-10-CM

## 2016-03-17 DIAGNOSIS — Z87898 Personal history of other specified conditions: Secondary | ICD-10-CM

## 2016-03-17 DIAGNOSIS — N3000 Acute cystitis without hematuria: Secondary | ICD-10-CM

## 2016-03-17 DIAGNOSIS — Z7901 Long term (current) use of anticoagulants: Secondary | ICD-10-CM | POA: Diagnosis not present

## 2016-03-17 DIAGNOSIS — I10 Essential (primary) hypertension: Secondary | ICD-10-CM | POA: Diagnosis not present

## 2016-03-17 DIAGNOSIS — C50211 Malignant neoplasm of upper-inner quadrant of right female breast: Secondary | ICD-10-CM

## 2016-03-17 DIAGNOSIS — I481 Persistent atrial fibrillation: Secondary | ICD-10-CM

## 2016-03-17 DIAGNOSIS — J3089 Other allergic rhinitis: Secondary | ICD-10-CM

## 2016-03-17 DIAGNOSIS — Z8673 Personal history of transient ischemic attack (TIA), and cerebral infarction without residual deficits: Secondary | ICD-10-CM

## 2016-03-17 DIAGNOSIS — J101 Influenza due to other identified influenza virus with other respiratory manifestations: Secondary | ICD-10-CM | POA: Diagnosis not present

## 2016-03-17 DIAGNOSIS — K219 Gastro-esophageal reflux disease without esophagitis: Secondary | ICD-10-CM

## 2016-03-17 DIAGNOSIS — I4819 Other persistent atrial fibrillation: Secondary | ICD-10-CM

## 2016-03-17 DIAGNOSIS — J302 Other seasonal allergic rhinitis: Secondary | ICD-10-CM

## 2016-03-17 NOTE — Progress Notes (Signed)
DATE:  03/17/2016   MRN:  [id number]  BIRTHDAY: Sep 23, 1928  Facility:  Nursing Home Location:  [place] Room Number: 706-P  LEVEL OF CARE:  SNF [phone])  Contact Information    Name Relation Home Work [place] Daughter [phone]  [phone]   Sheridan,Louis Spouse [phone]     Arzola,Deb Daughter   [phone]       Code Status History    Date Active Date Inactive Code Status Order ID Comments User Context   03/12/2016 12:35 AM 03/16/2016  5:46 PM Full Code [id number]  Rise Patience, MD ED   06/27/2014  4:06 PM 06/27/2014  8:04 PM Full Code [id number]  Thompson Grayer, MD Inpatient   01/19/2013  1:57 PM 01/20/2013  8:33 PM Full Code [id number]  [name], MD Inpatient   08/23/2012 10:23 PM 08/25/2012  6:37 PM Full Code [id number]  Orvan Falconer, MD Inpatient   02/08/2011  8:02 PM 02/10/2011  2:39 PM Full Code [id number]  Tildon Husky, RN Inpatient   01/15/2011  6:29 PM 01/18/2011  1:15 PM Full Code [id number]  Ronnette Hila, RN Inpatient       Chief Complaint  Patient presents with  . Hospitalization Follow-up    HISTORY OF PRESENT ILLNESS:  This is an 42-YO female seen for hospital follow-up.  She was admitted to [place] on 03/16/2016 for short-term rehabilitation following an admission at [facility] 03/11/2016-03/16/2016 for complaints of generalized weakness with difficulty ambulating, fever, chills, and productive cough. She was diagnosed with UTI and  influenza A. She was treated with Tamiflu and Rocephin. Wheezing improved after giving prednisone and albuterol when necessary. She has PMH of atrial fibrillation, complete heart block S/P pacemaker placement, history of stroke and history of breast cancer.    She was seen in the room today and did not verbalize any concerns.   PAST MEDICAL HISTORY:  Past Medical History:  Diagnosis Date  . Acute ischemic stroke ([place]) 08/23/2012  . Acute,  but ill-defined, cerebrovascular disease 11/08/2012  . Arthritis    . Atrial fibrillation ([place])    a. s/p AVN ablation  . Cancer [facility]) 12/2010   breast with recurrence s/p recent XRT  . Complete heart block (HCC)    a. s/p STJ CRTP  . Compression fracture        . CVA 05/07/2008      . Fibrocystic breast   . GERD (gastroesophageal reflux disease)   . Glaucoma   . Hyperlipidemia   . Hypertension   . Orthostatic hypotension   . Overactive bladder   . Parkinsonism ([place])   . Pericarditis       . 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 Seasonal allergic rhinitis    Pos Skin Test 07-05-07  . Seizures ([place]) 2013      . Thoracic aneurysm without mention of rupture      CURRENT MEDICATIONS: Reviewed  Patient's Medications  New Prescriptions   No medications on file  Previous Medications   ACETAMINOPHEN (TYLENOL) 500 MG TABLET    Take 1,000 mg by mouth every 6 (six) hours as needed for moderate pain (pain).    ALBUTEROL (PROVENTIL) (2.5 MG/3ML) 0.083% NEBULIZER SOLUTION    Take 3 mLs (2.5 mg total) by nebulization every 6 (six) hours as needed for wheezing or shortness of breath.   ANASTROZOLE (ARIMIDEX) 1 MG TABLET    Take 1 tablet by  mouth  daily   BENZONATATE (TESSALON) 100 MG CAPSULE    TAKE 1 CAPSULE BY MOUTH 3TIMES A DAY AS NEEDED FORCOUGH   CALCIUM CARBONATE-VITAMIN D (CALCIUM-VITAMIN D) 500-200 MG-UNIT PER TABLET    Take 1 tablet by mouth daily.    CETIRIZINE (ZYRTEC) 10 MG TABLET    Take 10 mg by mouth at bedtime as needed for allergies or rhinitis.    COLESTIPOL (COLESTID) 1 G TABLET    Take 1 g by mouth at bedtime.   FUROSEMIDE (LASIX) 40 MG TABLET    TAKE 1 TABLET BY MOUTH  EVERY MORNING   LEVETIRACETAM (KEPPRA) 250 MG TABLET    Take 250 mg by mouth 2 (two) times daily.   MULTIPLE VITAMIN (MULTIVITAMIN WITH MINERALS) TABS TABLET    Take 1 tablet by mouth daily.   OMEGA-3 FATTY ACIDS (FISH OIL) 1000 MG CAPS    Take 1 capsule by mouth 2 (two) times daily.     PANTOPRAZOLE (PROTONIX) 40 MG TABLET    Take 1 tablet by mouth daily.   POTASSIUM CHLORIDE SA (K-DUR,KLOR-CON) 20 MEQ TABLET    TAKE 1 TABLET BY MOUTH  DAILY   PRAVASTATIN (PRAVACHOL) 80 MG TABLET    TAKE 1 TABLET BY MOUTH  DAILY   PROBIOTIC PRODUCT (ALIGN) 4 MG CAPS    Take 4 mg by mouth daily.   PROPRANOLOL (INDERAL) 60 MG TABLET    TAKE ONE-HALF TABLET BY  MOUTH TWICE A DAY   WARFARIN (COUMADIN) 2.5 MG TABLET    Take 1/2-1 tablet by mouth daily as directed by Coumadin Clini  Modified Medications   No medications on file  Discontinued Medications   LEVETIRACETAM (KEPPRA) 500 MG TABLET    Take 250 mg by mouth every 12 (twelve) hours. Take 1/2 tablet to = 250 mg     No Known Allergies   REVIEW OF SYSTEMS:  GENERAL: no change in appetite, no fatigue, no weight changes, no fever, chills or weakness EYES: Denies change in vision, dry eyes, eye pain, itching or discharge EARS: Denies change in hearing, ringing in ears, or earache NOSE: Denies nasal congestion or epistaxis MOUTH and THROAT: Denies oral discomfort, gingival pain or bleeding, pain from teeth or hoarseness   RESPIRATORY: no cough, SOB, DOE, wheezing, hemoptysis CARDIAC: no chest pain, edema or palpitations GI: no abdominal pain, diarrhea, constipation, heart burn, nausea or vomiting GU: Denies dysuria, frequency, hematuria, incontinence, or discharge PSYCHIATRIC: Denies feeling of depression or anxiety. No report of hallucinations, insomnia, paranoia, or agitation    PHYSICAL EXAMINATION  GENERAL APPEARANCE: Well nourished. In no acute distress. Normal body habitus SKIN:  Skin is warm and dry.  HEAD: Normal in size and contour. No evidence of trauma EYES: Lids open and close normally. No blepharitis, entropion or ectropion. PERRL. Conjunctivae are clear and sclerae are white. Lenses are without opacity EARS: Pinnae are normal. Patient hears normal voice tunes of the examiner MOUTH and THROAT: Lips are without  lesions. Oral mucosa is moist and without lesions. Tongue is normal in shape, size, and color and without lesions NECK: supple, trachea midline, no neck masses, no thyroid tenderness, no thyromegaly LYMPHATICS: no LAN in the neck, no supraclavicular LAN RESPIRATORY: breathing is even & unlabored, BS CTAB CARDIAC: RRR, no murmur,no extra heart sounds, no edema, has pacemaker GI: abdomen soft, normal BS, no masses, no tenderness, no hepatomegaly, no splenomegaly EXTREMITIES:  Able to move 4 extremities, walks with walker  PSYCHIATRIC: Alert and oriented X 3. Affect  and behavior are appropriate   LABS/RADIOLOGY: Labs reviewed: Basic Metabolic Panel:  Recent Labs  03/12/16 0308 03/13/16 0345 03/14/16 0241  NA 138 140 137  K 3.4* 3.8 3.5  CL 99* 104 104  CO2 27 24 21*  GLUCOSE 113* 100* 96  BUN 14 12 8   CREATININE 0.88 0.71 0.74  CALCIUM 8.9 8.7* 8.6*   Liver Function Tests:  Recent Labs  10/23/15 1204 01/06/16 1420  AST 17 20  ALT 12 15  ALKPHOS 28* 37*  BILITOT 0.5 0.51  PROT 7.6 7.7  ALBUMIN 4.4 4.0   CBC:  Recent Labs  10/01/15 1120 01/06/16 1420 03/11/16 1843 03/12/16 0308 03/14/16 0241  WBC 6.0 7.2 7.5 5.9 5.6  NEUTROABS 3,180 4.2 5.7  --   --   HGB 12.4 13.2 12.4 11.6* 11.1*  HCT 36.7 40.6 38.0 36.3 34.4*  MCV 87.6 89.5 89.8 90.5 90.3  PLT 155 159 123* 120* 93*     Dg Chest 2 View  Result Date: 03/11/2016 CLINICAL DATA:  Flu like symptoms nonproductive cough EXAM: CHEST  2 VIEW COMPARISON:  10/15/2015 FINDINGS: Multi lead right-sided pacing device similar compared to prior. No focal consolidation or pleural effusion. Stable cardiomediastinal silhouette with prominent left pericardial fat pad. Atherosclerosis of the aorta. No pneumothorax. IMPRESSION: No active cardiopulmonary disease. Electronically Signed   By: Donavan Foil M.D.   On: 03/11/2016 19:17    ASSESSMENT/PLAN:  Physical deconditioning  - for rehabilitation, PT and OT, for therapeutic  strengthening exercises; fall precautions   Influenza A - influenza A screening positive for influenza A; S/P Tamiflu and continue benzonatate 100 mg by mouth 3 times a day when necessary   Urinary tract infection - urine culture polymicrobial, S/P Rocephin   Hyperlipidemia - continue colestipol 1 g 1 tab by mouth daily at bedtime, pravastatin 80 mg 1 tab by mouth daily at bedtime and Fish oil 1000 mg 1 capsule by mouth twice a day  Allergic rhinitis - continue cetirizine 10 mg 1 tab by mouth daily at bedtime when necessary  History of seizures - continue Keppra 250 mg 1 tab by mouth every 12 hours  Hypertension - continue propranolol 60 mg 1/2 tab by mouth twice a day and Lasix 40 mg 1 tab by mouth every morning; check CMP; BP/HR twice a day 1 week  Right female breast cancer -S/P right mastectomy 3 years ago; continue anastrozole 1 mg 1 tab by mouth daily  Atrial fibrillation - rate controlled; continue propranolol 60 mg 1/2 tab = 30 mg by mouth twice a day and Coumadin  GERD - continue pantoprazole 40 mg 1 tab by mouth daily  Hypokalemia - continue KCl ER 20 meq 1 tab by mouth daily.   History of stroke - continue propranolol, Lasix and pravastatin  Long-term use of anticoagulant - INR 3.5; hold Coumadin and recheck INR on 03/18/16    Goals of care:  Short-term rehabilitation    Ailani Governale C. [place] - NP   [employer] [phone]

## 2016-03-18 LAB — CBC AND DIFFERENTIAL
HCT: 39 % (ref 36–46)
Hemoglobin: 13 g/dL (ref 12.0–16.0)
Platelets: 183 10*3/uL (ref 150–399)
WBC: 7.4 10^3/mL

## 2016-03-18 LAB — HEPATIC FUNCTION PANEL
ALT: 35 U/L (ref 7–35)
AST: 29 U/L (ref 13–35)
Alkaline Phosphatase: 36 U/L (ref 25–125)
Bilirubin, Total: 0.4 mg/dL

## 2016-03-18 LAB — BASIC METABOLIC PANEL
BUN: 18 mg/dL (ref 4–21)
Creatinine: 0.8 mg/dL (ref 0.5–1.1)
Glucose: 102 mg/dL
Potassium: 3.9 mmol/L (ref 3.4–5.3)
Sodium: 141 mmol/L (ref 137–147)

## 2016-03-19 IMAGING — CT CT HEAD WO/W CM
1 of 2 series · 13 of 30 positions shown, 17 images · IV contrast (omnipaque)
Comparison: CT head 08/23/2012.

CLINICAL DATA: Found down earlier today. RIGHT-sided weakness.
History of prior strokes.

EXAM:
CT HEAD WITHOUT AND WITH CONTRAST
TECHNIQUE: Contiguous axial images were obtained from the base of the skull
through the vertex without and with intravenous contrast
CONTRAST:  100mL OMNIPAQUE IOHEXOL 300 MG/ML  SOLN

[Series 2: head w/o · axial · non-contrast · 0.41mm/px · z∈[-134,-14]mm · 13 of 30 slices shown, 17 images]
[im 3/30  brain]
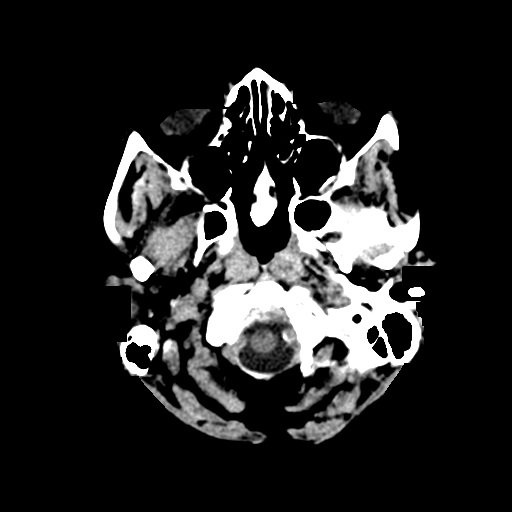
[im 3/30  bone]
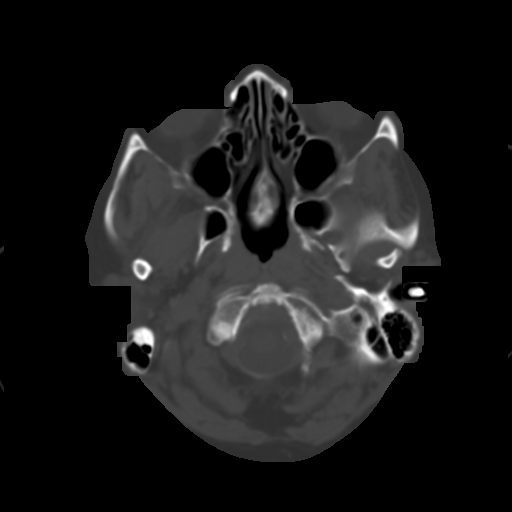
[im 5/30  brain]
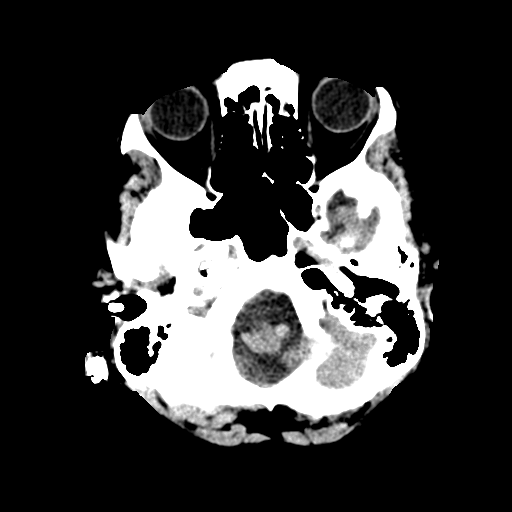
[im 7/30  brain]
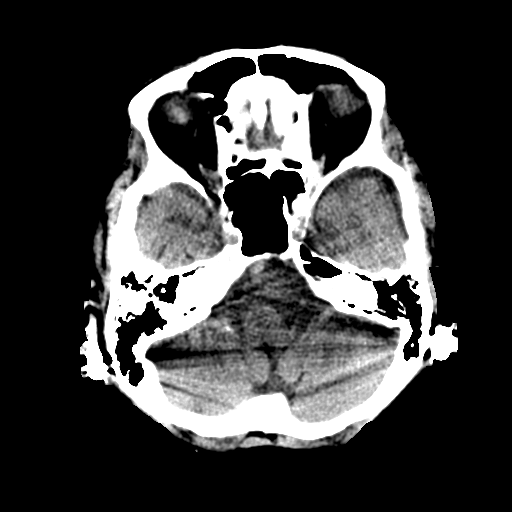
[im 9/30  brain]
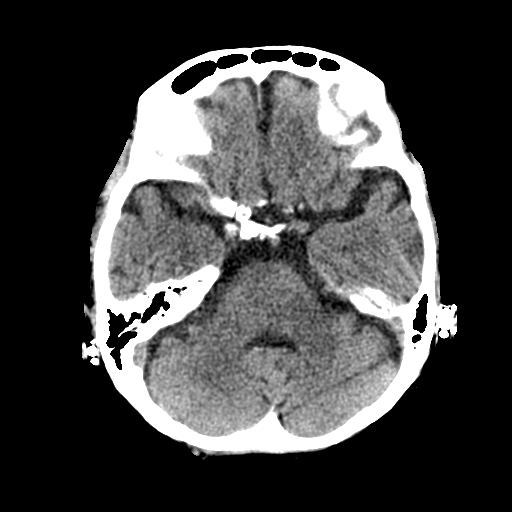
[im 11/30  brain]
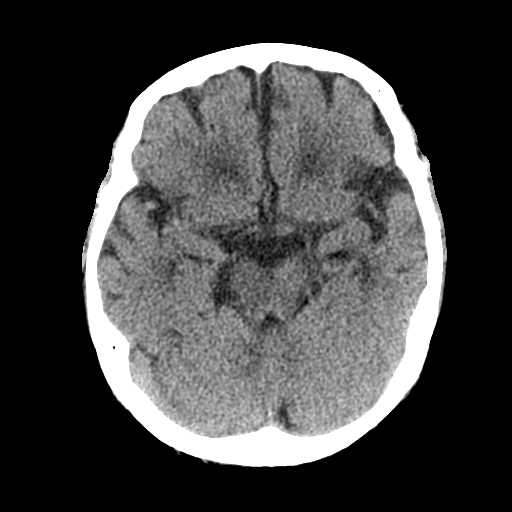
[im 11/30  bone]
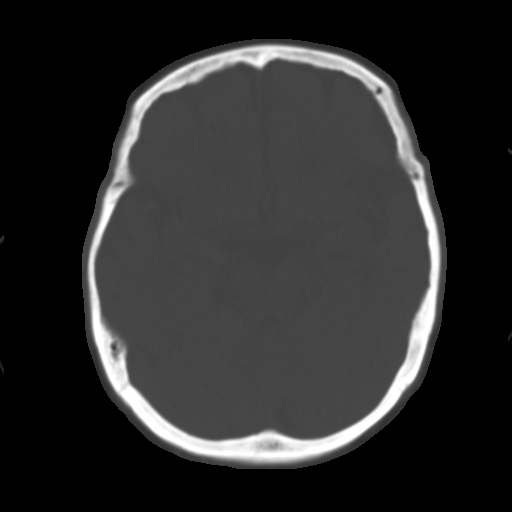
[im 13/30  brain]
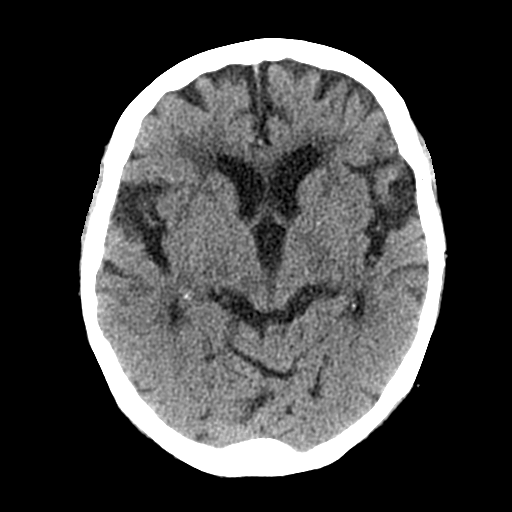
[im 15/30  brain]
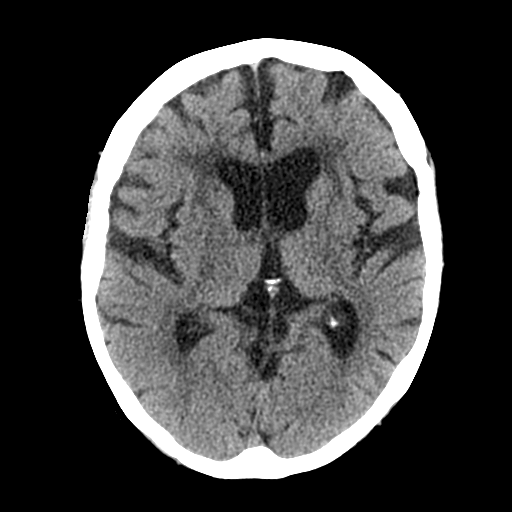
[im 17/30  brain]
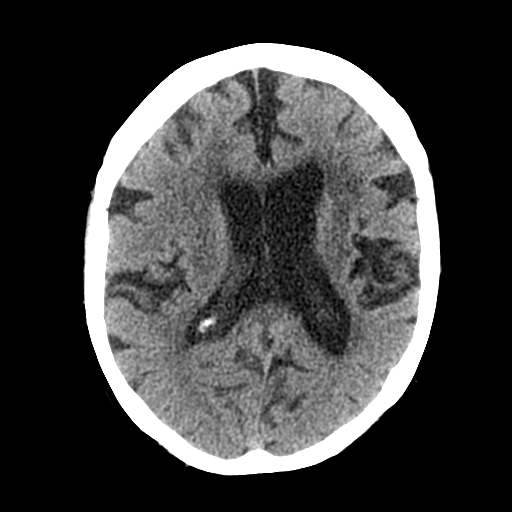
[im 19/30  brain]
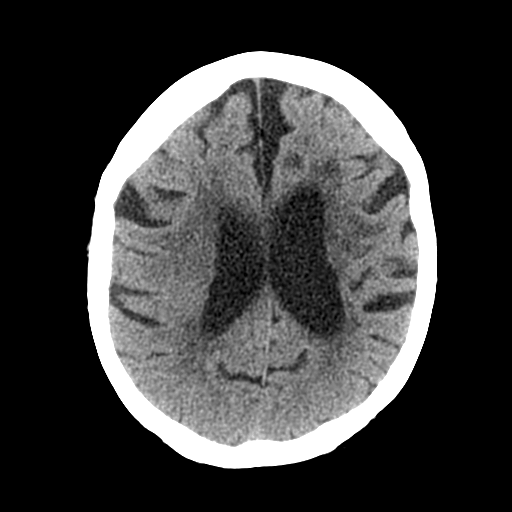
[im 19/30  bone]
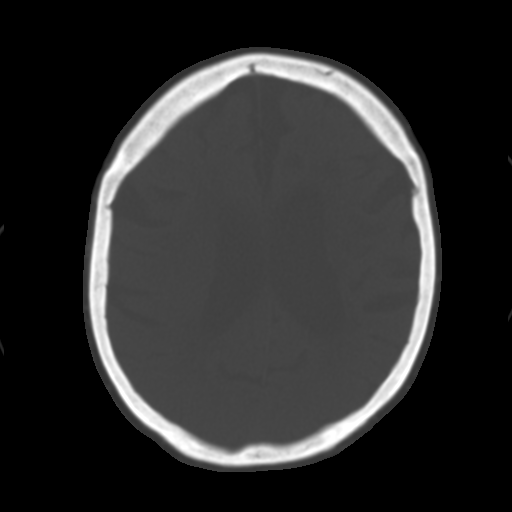
[im 21/30  brain]
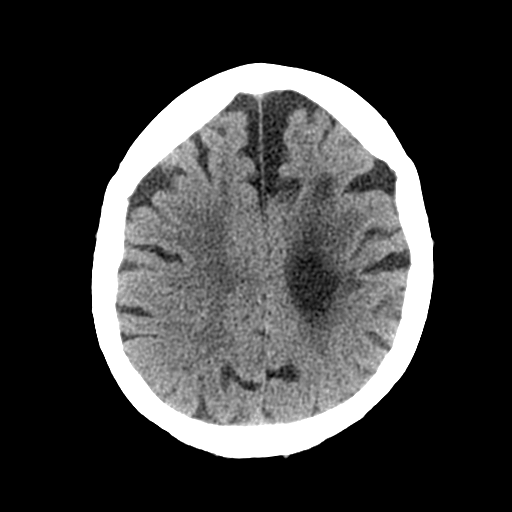
[im 23/30  brain]
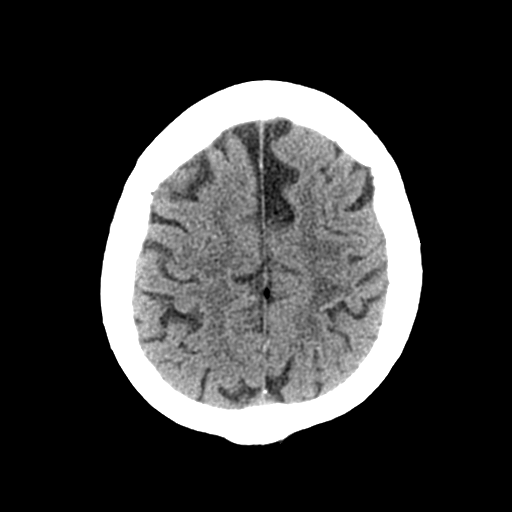
[im 25/30  brain]
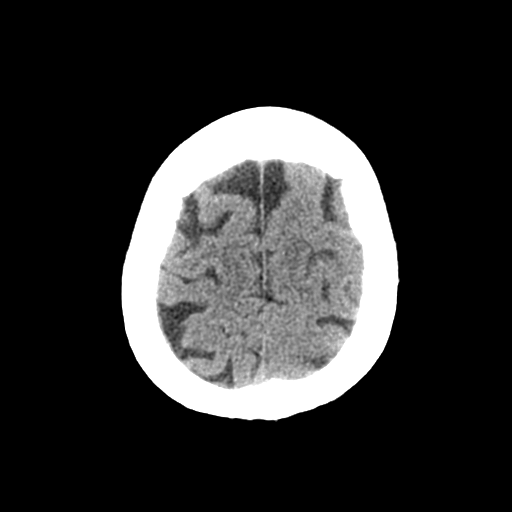
[im 27/30  brain]
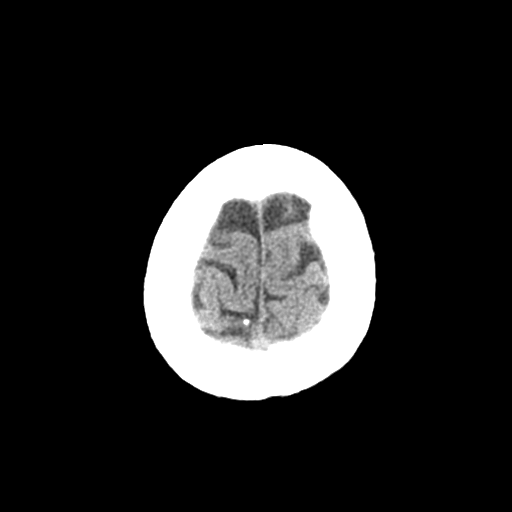
[im 27/30  bone]
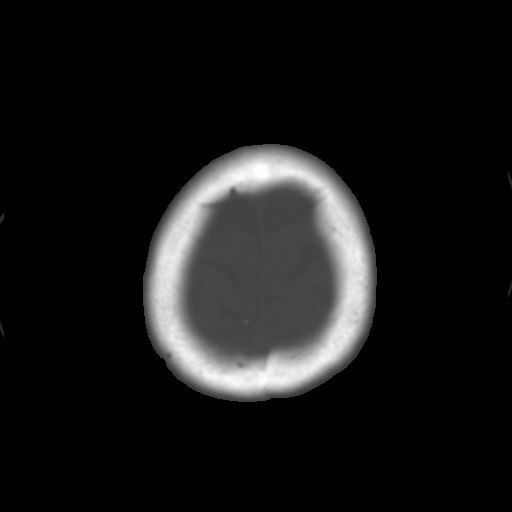

[13 of 30 positions shown; findings below may reference images not displayed]

FINDINGS: Generalized atrophy. Chronic microvascular ischemic change, greater
on the LEFT, was probably present in 9217 but better appreciated on
today's scan. No features to strongly suggest subcortical infarction
of an acute nature.

No visible intracranial hemorrhage, mass lesion, or subdural
collection. Hydrocephalus ex vacuo.

No CT signs of proximal vascular thrombosis. MCA M2 calcification in
the RIGHT sylvian fissure is stable.Moderate vascular calcification
in the carotid siphon and BILATERAL vertebral arteries.

Generalized cortical atrophy appears unchanged from priors.
Prominence of the sylvian fissures. No visible skull fracture. No
significant scalp hematoma.

Incompletely visualized posterior fossa due to BILATERAL hearing
aids. No sinus or mastoid disease.
IMPRESSION: No definite acute intracranial findings. Generalized atrophy and
chronic microvascular ischemic change.

Asymmetry of subcortical white matter hypoattenuation on the LEFT
similar to 9217; see discussion above.

## 2016-03-25 ENCOUNTER — Telehealth: Payer: Self-pay

## 2016-03-25 NOTE — Telephone Encounter (Signed)
Call to daughter, [name].  Patient currently in nursing home for rehab after being hospitalized for flu.  Expected discharge is 04/03/2016.  Advised would reschedule ICM remote transmission for 04/08/2016.  Daughter reported patient's husband was also hospitalized for flu at same time as patient.  During this time daughter has a broken foot from a fall and unable to drive at this time.

## 2016-03-26 ENCOUNTER — Encounter: Payer: Self-pay | Admitting: Internal Medicine

## 2016-03-26 ENCOUNTER — Non-Acute Institutional Stay (SKILLED_NURSING_FACILITY): Payer: Medicare Other | Admitting: Internal Medicine

## 2016-03-26 DIAGNOSIS — J3089 Other allergic rhinitis: Secondary | ICD-10-CM

## 2016-03-26 DIAGNOSIS — I10 Essential (primary) hypertension: Secondary | ICD-10-CM | POA: Diagnosis not present

## 2016-03-26 DIAGNOSIS — R5381 Other malaise: Secondary | ICD-10-CM | POA: Diagnosis not present

## 2016-03-26 DIAGNOSIS — E782 Mixed hyperlipidemia: Secondary | ICD-10-CM

## 2016-03-26 DIAGNOSIS — I481 Persistent atrial fibrillation: Secondary | ICD-10-CM

## 2016-03-26 DIAGNOSIS — J101 Influenza due to other identified influenza virus with other respiratory manifestations: Secondary | ICD-10-CM

## 2016-03-26 DIAGNOSIS — N3000 Acute cystitis without hematuria: Secondary | ICD-10-CM | POA: Diagnosis not present

## 2016-03-26 DIAGNOSIS — J302 Other seasonal allergic rhinitis: Secondary | ICD-10-CM

## 2016-03-26 DIAGNOSIS — I4819 Other persistent atrial fibrillation: Secondary | ICD-10-CM

## 2016-03-26 DIAGNOSIS — C50211 Malignant neoplasm of upper-inner quadrant of right female breast: Secondary | ICD-10-CM

## 2016-03-26 NOTE — Progress Notes (Signed)
Provider:  Rexene Edison. Mariea Clonts, D.O., C.M.D. Location:  [place] Room Number: 706-P Place of Service:  SNF ([phone])  PCP: Aretta Nip, MD Patient Care Team: Aretta Nip, MD as PCP - General (Family Medicine) Chauncey Cruel, MD as Consulting Physician (Oncology) [name], MD as Consulting Physician (General Surgery)  Extended Emergency Contact Information Primary Emergency Contact: Wilcox,Cynthia Address: [address] [place], [place] [address] Johnnette Litter of [place] Phone: [phone] Mobile Phone: [phone] Relation: Daughter Secondary Emergency Contact: Borge,Louis Address: [address]          [place], [place] [address] Johnnette Litter of [place] Phone: [phone] Relation: Spouse  Code Status: Full Code Goals of Care: Advanced Directive information Advanced Directives 03/11/2016  Does Patient Have a Medical Advance Directive? No  Type of Advance Directive -  Does patient want to make changes to medical advance directive? -  Copy of [place] in Chart? -  Would patient like information on creating a medical advance directive? No - Patient declined  Pre-existing out of facility DNR order (yellow form or pink MOST form) -   Chief Complaint  Patient presents with  . New Admit To SNF    New Admission Visit     HPI: Patient is a 83 y.o. female seen today for admission to [facility]hab for PT, OT s/p hospitalization with weakness, influenza A, UTI, acute bronchospasm, hypokalemia.  She has a h/o afib, htn, hyperlipidemia, pacemaker and right-sided breast cancer.  Her INR for her afib goal is 2-3.  Yesterday was 1.3 and today 1.6.  She is on 6 mg of coumadin daily.  When seen, she was feeling pretty well, getting stronger.  She has been going to PT, OT.  She has plans for d/c home 3/2.  Past Medical History:  Diagnosis Date  . Acute ischemic stroke ([place])  08/23/2012  . Acute, but ill-defined, cerebrovascular disease 11/08/2012  . Arthritis    . Atrial fibrillation ([place])    a. s/p AVN ablation  . Cancer [facility]) 12/2010   breast with recurrence s/p recent XRT  . Complete heart block (HCC)    a. s/p STJ CRTP  . Compression fracture        . CVA 05/07/2008      . Fibrocystic breast   . GERD (gastroesophageal reflux disease)   . Glaucoma   . Hyperlipidemia   . Hypertension   . Orthostatic hypotension   . Overactive bladder   . Parkinsonism ([place])   . Pericarditis       . 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 Seasonal allergic rhinitis    Pos Skin Test 07-05-07  . Seizures ([place]) 2013      . Thoracic aneurysm without mention of rupture    Past Surgical History:  Procedure Laterality Date  . ABDOMINAL HYSTERECTOMY  1975   pt. unsure if laparascopic/vaginal/abdominal  . ABLATION  1980s   AVN ablation   . APPENDECTOMY    . BI-VENTRICULAR PACEMAKER UPGRADE  01/2007   3rd device, upgraded to SJM Frontier II BiV pacemaker by Dr Leonia Reeves  . BREAST SURGERY Right 01/15/11   mastectomy  . CATARACT EXTRACTION Bilateral   . CHOLECYSTECTOMY    . EP IMPLANTABLE DEVICE N/A 06/27/2014   CRT-P  ([facility]) gen change by Dr Rayann Heman  . EYE SURGERY  For glaucoma  . FLEXIBLE BRONCHOSCOPY W/ UPPER ENDOSCOPY     and swallowing evaluation  . KNEE ARTHROSCOPY Right    Arthroscopic for torn meniscus  . MASS EXCISION Right 01/19/2013   Procedure: EXCISION CHEST WALL MASS;  Surgeon: [name], MD;  Location: [facility] ORS;  Service: General;  Laterality: Right;  . PACEMAKER INSERTION     1992; gen change 2004  . VESICOVAGINAL FISTULA CLOSURE W/ TAH      Social History   Social History  . Marital status: Married    Spouse name: N/A  . Number of children: 2  . Years of education: N/A   Occupational History  . retired Secretary/administrator    Social History Main Topics  . Smoking status: Never Smoker  . Smokeless tobacco: Never Used  .  Alcohol use No  . Drug use: No  . Sexual activity: Not Asked   Other Topics Concern  . None   Social History Narrative  . None    reports that she has never smoked. She has never used smokeless tobacco. She reports that she does not drink alcohol or use drugs.  Functional Status Survey:    Family History  Problem Relation Age of Onset  . Heart failure Mother   . Cancer Father     brain  . Asthma Brother   . Asthma Daughter   . Heart disease    . Cancer    . Lymphoma Brother   . Diabetes Brother   . Hypertension Brother   . Heart attack Brother     Health Maintenance  Topic Date Due  . Samul Dada  12/05/1947  . PNA vac Low Risk Adult (2 of 2 - PCV13) 08/07/2004  . INFLUENZA VACCINE  Completed  . DEXA SCAN  Completed    No Known Allergies  Allergies as of 03/26/2016   No Known Allergies     Medication List       Accurate as of 03/26/16  3:07 PM. Always use your most recent med list.          acetaminophen 500 MG tablet Commonly known as:  TYLENOL Take 1,000 mg by mouth every 6 (six) hours as needed for moderate pain (pain).   albuterol (2.5 MG/3ML) 0.083% nebulizer solution Commonly known as:  PROVENTIL Take 3 mLs (2.5 mg total) by nebulization every 6 (six) hours as needed for wheezing or shortness of breath.   ALIGN 4 MG Caps Take 4 mg by mouth daily.   anastrozole 1 MG tablet Commonly known as:  ARIMIDEX Take 1 tablet by mouth  daily   atorvastatin 10 MG tablet Commonly known as:  LIPITOR Take 10 mg by mouth daily.   benzonatate 100 MG capsule Commonly known as:  TESSALON TAKE 1 CAPSULE BY MOUTH 3TIMES A DAY AS NEEDED FORCOUGH   calcium-vitamin D 500-200 MG-UNIT tablet Take 1 tablet by mouth daily.   cetirizine 10 MG tablet Commonly known as:  ZYRTEC Take 10 mg by mouth at bedtime as needed for allergies or rhinitis.   colestipol 1 g tablet Commonly known as:  COLESTID Take 1 g by mouth at bedtime.   Fish Oil 1000 MG Caps Take 1  capsule by mouth 2 (two) times daily.   furosemide 40 MG tablet Commonly known as:  LASIX TAKE 1 TABLET BY MOUTH  EVERY MORNING   levETIRAcetam 250 MG tablet Commonly known as:  KEPPRA Take 250 mg by mouth 2 (two) times daily.   multivitamin with minerals  Tabs tablet Take 1 tablet by mouth daily.   pantoprazole 40 MG tablet Commonly known as:  PROTONIX Take 1 tablet by mouth daily.   potassium chloride SA 20 MEQ tablet Commonly known as:  K-DUR,KLOR-CON TAKE 1 TABLET BY MOUTH  DAILY   propranolol 60 MG tablet Commonly known as:  INDERAL TAKE ONE-HALF TABLET BY  MOUTH TWICE A DAY   warfarin 6 MG tablet Commonly known as:  COUMADIN Take 6 mg by mouth daily.       Review of Systems  Constitutional: Positive for malaise/fatigue. Negative for chills and fever.  HENT: Negative for congestion.   Eyes:       Glasses  Respiratory: Negative for cough and shortness of breath.   Cardiovascular: Negative for chest pain, palpitations and leg swelling.  Gastrointestinal: Negative for abdominal pain, blood in stool, constipation, diarrhea, melena, nausea and vomiting.  Genitourinary: Negative for dysuria.  Musculoskeletal: Negative for falls.  Skin: Negative for itching and rash.  Neurological: Positive for weakness. Negative for dizziness and loss of consciousness.  Endo/Heme/Allergies: Bruises/bleeds easily.  Psychiatric/Behavioral: Negative for depression and memory loss.    Vitals:   03/26/16 1502  BP: 125/81  Pulse: 78  Resp: 18  Temp: 98.1 F (36.7 C)  TempSrc: Oral  SpO2: 97%  Weight: 148 lb 9.6 oz (67.4 kg)  Height: 5\' 2"  (1.575 m)   Body mass index is 27.18 kg/m. Physical Exam  Constitutional: She is oriented to person, place, and time. She appears well-developed and well-nourished. No distress.  HENT:  Head: Normocephalic and atraumatic.  Right Ear: External ear normal.  Left Ear: External ear normal.  Nose: Nose normal.  Mouth/Throat: Oropharynx is  clear and moist.  Eyes: Conjunctivae and EOM are normal. Pupils are equal, round, and reactive to light.  Neck: Normal range of motion. Neck supple. No JVD present.  Cardiovascular:  irreg irreg  Pulmonary/Chest: Effort normal and breath sounds normal. No respiratory distress. She has no rales.  Abdominal: Soft. Bowel sounds are normal.  Musculoskeletal: Normal range of motion. She exhibits no tenderness.  Lymphadenopathy:    She has no cervical adenopathy.  Neurological: She is alert and oriented to person, place, and time.  Skin: Skin is warm and dry. Capillary refill takes less than 2 seconds.  Psychiatric: She has a normal mood and affect.    Labs reviewed: Basic Metabolic Panel:  Recent Labs  03/12/16 0308 03/13/16 0345 03/14/16 0241  NA 138 140 137  K 3.4* 3.8 3.5  CL 99* 104 104  CO2 27 24 21*  GLUCOSE 113* 100* 96  BUN 14 12 8   CREATININE 0.88 0.71 0.74  CALCIUM 8.9 8.7* 8.6*   Liver Function Tests:  Recent Labs  10/23/15 1204 01/06/16 1420  AST 17 20  ALT 12 15  ALKPHOS 28* 37*  BILITOT 0.5 0.51  PROT 7.6 7.7  ALBUMIN 4.4 4.0   No results for input(s): LIPASE, AMYLASE in the last 8760 hours. No results for input(s): AMMONIA in the last 8760 hours. CBC:  Recent Labs  10/01/15 1120 01/06/16 1420 03/11/16 1843 03/12/16 0308 03/14/16 0241  WBC 6.0 7.2 7.5 5.9 5.6  NEUTROABS 3,180 4.2 5.7  --   --   HGB 12.4 13.2 12.4 11.6* 11.1*  HCT 36.7 40.6 38.0 36.3 34.4*  MCV 87.6 89.5 89.8 90.5 90.3  PLT 155 159 123* 120* 93*   Cardiac Enzymes: No results for input(s): CKTOTAL, CKMB, CKMBINDEX, TROPONINI in the last 8760 hours. BNP: Invalid input(s):  POCBNP Lab Results  Component Value Date   HGBA1C 5.5 08/24/2012   No results found for: TSH Lab Results  Component Value Date   VITAMINB12 548 04/09/2014   Lab Results  Component Value Date   FOLATE >20.0 04/09/2014   No results found for: IRON, TIBC, FERRITIN  Imaging and Procedures obtained  prior to SNF admission: Dg Chest 2 View  Result Date: 03/11/2016 CLINICAL DATA:  Flu like symptoms nonproductive cough EXAM: CHEST  2 VIEW COMPARISON:  10/15/2015 FINDINGS: Multi lead right-sided pacing device similar compared to prior. No focal consolidation or pleural effusion. Stable cardiomediastinal silhouette with prominent left pericardial fat pad. Atherosclerosis of the aorta. No pneumothorax. IMPRESSION: No active cardiopulmonary disease. Electronically Signed   By: Donavan Foil M.D.   On: 03/11/2016 19:17    Assessment/Plan 1. Physical deconditioning -improving strength with PT, OT, has plans to go home 3/2  2. Influenza A -recovering well, completed tamiflu and cough resolved  3. Acute cystitis without hematuria -resolved with abx   4. Mixed hyperlipidemia -continues on her lipitor therapy  5. Seasonal and perennial allergic rhinitis -cont zyrtec  6. Malignant neoplasm of upper-inner quadrant of right female breast, unspecified estrogen receptor status ([place]) -continues on anastrazole therapy   7. Essential hypertension -bp at goal with inderal, lasix  8. Persistent atrial fibrillation (HCC) -rate controlled, on coumadin with INR goal 2-3 -has been subtherapeutic -cont 6mg  and recheck INR 2/26  Family/ staff Communication: discussed with nursing  Labs/tests ordered:  INR 2/26  Jazzman Loughmiller L. Iyla Balzarini, D.O. [facility] [address], [place] [address] Cell Phone (Mon-Fri 8am-5pm):  [phone] On Call:  [phone] & follow prompts after 5pm & weekends Office Phone:  [phone] Office Fax:  [phone]

## 2016-04-01 ENCOUNTER — Encounter: Payer: Self-pay | Admitting: Adult Health

## 2016-04-01 ENCOUNTER — Non-Acute Institutional Stay (SKILLED_NURSING_FACILITY): Payer: Medicare Other | Admitting: Adult Health

## 2016-04-01 DIAGNOSIS — R5381 Other malaise: Secondary | ICD-10-CM | POA: Diagnosis not present

## 2016-04-01 DIAGNOSIS — Z87898 Personal history of other specified conditions: Secondary | ICD-10-CM | POA: Diagnosis not present

## 2016-04-01 DIAGNOSIS — Z8673 Personal history of transient ischemic attack (TIA), and cerebral infarction without residual deficits: Secondary | ICD-10-CM | POA: Diagnosis not present

## 2016-04-01 DIAGNOSIS — Z7901 Long term (current) use of anticoagulants: Secondary | ICD-10-CM

## 2016-04-01 DIAGNOSIS — E876 Hypokalemia: Secondary | ICD-10-CM | POA: Diagnosis not present

## 2016-04-01 DIAGNOSIS — J302 Other seasonal allergic rhinitis: Secondary | ICD-10-CM

## 2016-04-01 DIAGNOSIS — J101 Influenza due to other identified influenza virus with other respiratory manifestations: Secondary | ICD-10-CM | POA: Diagnosis not present

## 2016-04-01 DIAGNOSIS — K219 Gastro-esophageal reflux disease without esophagitis: Secondary | ICD-10-CM | POA: Diagnosis not present

## 2016-04-01 DIAGNOSIS — C50211 Malignant neoplasm of upper-inner quadrant of right female breast: Secondary | ICD-10-CM | POA: Diagnosis not present

## 2016-04-01 DIAGNOSIS — I10 Essential (primary) hypertension: Secondary | ICD-10-CM | POA: Diagnosis not present

## 2016-04-01 DIAGNOSIS — E782 Mixed hyperlipidemia: Secondary | ICD-10-CM

## 2016-04-01 DIAGNOSIS — J3089 Other allergic rhinitis: Secondary | ICD-10-CM | POA: Diagnosis not present

## 2016-04-01 DIAGNOSIS — N3000 Acute cystitis without hematuria: Secondary | ICD-10-CM | POA: Diagnosis not present

## 2016-04-01 DIAGNOSIS — I4819 Other persistent atrial fibrillation: Secondary | ICD-10-CM

## 2016-04-01 DIAGNOSIS — I481 Persistent atrial fibrillation: Secondary | ICD-10-CM

## 2016-04-01 NOTE — Progress Notes (Signed)
DATE:  04/01/2016   MRN:  [id number]  BIRTHDAY: 1928-10-13  Facility:  Nursing Home Location:  [place] Room Number: 706-P  LEVEL OF CARE:  SNF [phone])  Contact Information    Name Relation Home Work [place] Daughter [phone]  [phone]   Macari,Louis Spouse [phone]     Kelly,Deb Daughter   [phone]       Code Status History    Date Active Date Inactive Code Status Order ID Comments User Context   03/12/2016 12:35 AM 03/16/2016  5:46 PM Full Code [id number]  Rise Patience, MD ED   06/27/2014  4:06 PM 06/27/2014  8:04 PM Full Code [id number]  Thompson Grayer, MD Inpatient   01/19/2013  1:57 PM 01/20/2013  8:33 PM Full Code [id number]  [name], MD Inpatient   08/23/2012 10:23 PM 08/25/2012  6:37 PM Full Code [id number]  Orvan Falconer, MD Inpatient   02/08/2011  8:02 PM 02/10/2011  2:39 PM Full Code [id number]  Tildon Husky, RN Inpatient   01/15/2011  6:29 PM 01/18/2011  1:15 PM Full Code [id number]  Ronnette Hila, RN Inpatient       Chief Complaint  Patient presents with  . Discharge Note    HISTORY OF PRESENT ILLNESS:  This is an 56-YO female seen for a discharge visit.  She is discharging home on 04/02/2016 with home health PT, OT, CNA, and Nursing services.    She was seen in the room today and was happy to be going home. Latest INR 8.0, elevated. No bruising nor bleeding noted.  She was admitted to [facility] on 03/16/2016 for short-term rehabilitation following an admission at [facility] 03/11/2016-03/16/2016 for complaints of generalized weakness with difficulty ambulating, fever, chills, and productive cough. She was diagnosed with UTI and  influenza A. She was treated with Tamiflu and Rocephin. Wheezing improved after giving prednisone and albuterol when necessary. She has PMH of atrial fibrillation, complete heart block S/P pacemaker placement, history of stroke and history of  breast cancer.    Patient was admitted to this facility for short-term rehabilitation after the patient's recent hospitalization.  Patient has completed SNF rehabilitation and therapy has cleared the patient for discharge.   PAST MEDICAL HISTORY:  Past Medical History:  Diagnosis Date  . Acute ischemic stroke ([place]) 08/23/2012  . Acute, but ill-defined, cerebrovascular disease 11/08/2012  . Arthritis    . Atrial fibrillation ([place])    a. s/p AVN ablation  . Cancer [facility]) 12/2010   breast with recurrence s/p recent XRT  . Complete heart block (HCC)    a. s/p STJ CRTP  . Compression fracture        . CVA 05/07/2008      . Fibrocystic breast   . GERD (gastroesophageal reflux disease)   . Glaucoma   . Hyperlipidemia   . Hypertension   . Orthostatic hypotension   . Overactive bladder   . Parkinsonism ([place])   . Pericarditis       . 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 Seasonal allergic rhinitis    Pos Skin Test 07-05-07  . Seizures ([place]) 2013      . Thoracic aneurysm without mention of rupture      CURRENT MEDICATIONS: Reviewed  Patient's Medications  New Prescriptions   No medications on file  Previous Medications   ACETAMINOPHEN (TYLENOL) 500 MG TABLET    Take 1,000 mg by  mouth every 6 (six) hours as needed for moderate pain (pain).    ALBUTEROL (PROVENTIL) (2.5 MG/3ML) 0.083% NEBULIZER SOLUTION    Take 3 mLs (2.5 mg total) by nebulization every 6 (six) hours as needed for wheezing or shortness of breath.   ANASTROZOLE (ARIMIDEX) 1 MG TABLET    Take 1 tablet by mouth  daily   ATORVASTATIN (LIPITOR) 10 MG TABLET    Take 10 mg by mouth daily.   BENZONATATE (TESSALON) 100 MG CAPSULE    TAKE 1 CAPSULE BY MOUTH 3TIMES A DAY AS NEEDED FORCOUGH   CALCIUM CARBONATE-VITAMIN D (CALCIUM-VITAMIN D) 500-200 MG-UNIT PER TABLET    Take 1 tablet by mouth daily.    CETIRIZINE (ZYRTEC) 10 MG TABLET    Take 10 mg by mouth at bedtime as needed for allergies or rhinitis.     COLESTIPOL (COLESTID) 1 G TABLET    Take 1 g by mouth at bedtime.   FUROSEMIDE (LASIX) 40 MG TABLET    TAKE 1 TABLET BY MOUTH  EVERY MORNING   LEVETIRACETAM (KEPPRA) 250 MG TABLET    Take 250 mg by mouth 2 (two) times daily.   MULTIPLE VITAMIN (MULTIVITAMIN WITH MINERALS) TABS TABLET    Take 1 tablet by mouth daily.   OMEGA-3 FATTY ACIDS (FISH OIL) 1000 MG CAPS    Take 1 capsule by mouth 2 (two) times daily.   PANTOPRAZOLE (PROTONIX) 40 MG TABLET    Take 1 tablet by mouth daily.   PHYTONADIONE (VITAMIN K) 5 MG TABLET    Take 5 mg by mouth once. Give 5 mg tab po x1 dose.   POTASSIUM CHLORIDE SA (K-DUR,KLOR-CON) 20 MEQ TABLET    TAKE 1 TABLET BY MOUTH  DAILY   PROBIOTIC PRODUCT (ALIGN) 4 MG CAPS    Take 4 mg by mouth daily.   PROPRANOLOL (INDERAL) 60 MG TABLET    TAKE ONE-HALF TABLET BY  MOUTH TWICE A DAY  Modified Medications   No medications on file  Discontinued Medications   WARFARIN (COUMADIN) 7.5 MG TABLET    Take 7.5 mg by mouth daily.     No Known Allergies   REVIEW OF SYSTEMS:  GENERAL: no change in appetite, no fatigue, no weight changes, no fever, chills or weakness EYES: Denies change in vision, dry eyes, eye pain, itching or discharge EARS: Denies change in hearing, ringing in ears, or earache NOSE: Denies nasal congestion or epistaxis MOUTH and THROAT: Denies oral discomfort, gingival pain or bleeding, pain from teeth or hoarseness   RESPIRATORY: no cough, SOB, DOE, wheezing, hemoptysis CARDIAC: no chest pain, edema or palpitations GI: no abdominal pain, diarrhea, constipation, heart burn, nausea or vomiting GU: Denies dysuria, frequency, hematuria, incontinence, or discharge PSYCHIATRIC: Denies feeling of depression or anxiety. No report of hallucinations, insomnia, paranoia, or agitation    PHYSICAL EXAMINATION  GENERAL APPEARANCE: Well nourished. In no acute distress. Normal body habitus SKIN:  Skin is warm and dry.  HEAD: Normal in size and contour. No evidence  of trauma EYES: Lids open and close normally. No blepharitis, entropion or ectropion. PERRL. Conjunctivae are clear and sclerae are white. Lenses are without opacity EARS: Pinnae are normal. Patient hears normal voice tunes of the examiner MOUTH and THROAT: Lips are without lesions. Oral mucosa is moist and without lesions. Tongue is normal in shape, size, and color and without lesions NECK: supple, trachea midline, no neck masses, no thyroid tenderness, no thyromegaly LYMPHATICS: no LAN in the neck, no supraclavicular LAN RESPIRATORY:  breathing is even & unlabored, BS CTAB CARDIAC: RRR, no murmur,no extra heart sounds, no edema, has pacemaker GI: abdomen soft, normal BS, no masses, no tenderness, no hepatomegaly, no splenomegaly EXTREMITIES:  Able to move 4 extremities, walks with walker  PSYCHIATRIC: Alert and oriented X 3. Affect and behavior are appropriate   LABS/RADIOLOGY: Labs reviewed: Basic Metabolic Panel:  Recent Labs  03/12/16 0308 03/13/16 0345 03/14/16 0241 03/18/16  NA 138 140 137 141  K 3.4* 3.8 3.5 3.9  CL 99* 104 104  --   CO2 27 24 21*  --   GLUCOSE 113* 100* 96  --   BUN 14 12 8 18   CREATININE 0.88 0.71 0.74 0.8  CALCIUM 8.9 8.7* 8.6*  --    Liver Function Tests:  Recent Labs  10/23/15 1204 01/06/16 1420 03/18/16  AST 17 20 29   ALT 12 15 35  ALKPHOS 28* 37* 36  BILITOT 0.5 0.51  --   PROT 7.6 7.7  --   ALBUMIN 4.4 4.0  --    CBC:  Recent Labs  10/01/15 1120 01/06/16 1420 03/11/16 1843 03/12/16 0308 03/14/16 0241 03/18/16  WBC 6.0 7.2 7.5 5.9 5.6 7.4  NEUTROABS 3,180 4.2 5.7  --   --   --   HGB 12.4 13.2 12.4 11.6* 11.1* 13.0  HCT 36.7 40.6 38.0 36.3 34.4* 39  MCV 87.6 89.5 89.8 90.5 90.3  --   PLT 155 159 123* 120* 93* 183     Dg Chest 2 View  Result Date: 03/11/2016 CLINICAL DATA:  Flu like symptoms nonproductive cough EXAM: CHEST  2 VIEW COMPARISON:  10/15/2015 FINDINGS: Multi lead right-sided pacing device similar compared to  prior. No focal consolidation or pleural effusion. Stable cardiomediastinal silhouette with prominent left pericardial fat pad. Atherosclerosis of the aorta. No pneumothorax. IMPRESSION: No active cardiopulmonary disease. Electronically Signed   By: Donavan Foil M.D.   On: 03/11/2016 19:17    ASSESSMENT/PLAN:  Physical deconditioning  - for Home health CNA, Nursing, PT and OT, for therapeutic strengthening exercises; fall precautions   Influenza A - Resolved; finished course of Tamiflu  Urinary tract infection - resolved  Hyperlipidemia - continue colestipol 1 g 1 tab by mouth daily at bedtime, pravastatin 80 mg 1 tab by mouth daily at bedtime and Fish oil 1000 mg 1 capsule by mouth twice a day  Allergic rhinitis - continue cetirizine 10 mg 1 tab by mouth daily at bedtime when necessary  History of seizures - no seizure episode; continue Keppra 250 mg 1 tab by mouth every 12 hours  Hypertension - well-controlled; continue propranolol 60 mg 1/2 tab by mouth twice a day and Lasix 40 mg 1 tab by mouth every morning  Right female breast cancer -S/P right mastectomy 3 years ago; continue anastrozole 1 mg 1 tab by mouth daily  Atrial fibrillation - rate controlled; continue propranolol 60 mg 1/2 tab = 30 mg by mouth twice a day and Coumadin  Long-term use of anticoagulant - INR 8.0, supratherapeutic; give vitamin K 5 mg by mouth 1, hold Coumadin and check INR on 04/02/16  GERD - continue pantoprazole 40 mg 1 tab by mouth daily  Hypokalemia - continue KCl ER 20 meq 1 tab by mouth daily.  Lab Results  Component Value Date   K 3.9 03/18/2016    History of stroke - continue propranolol, Lasix and pravastatin     I have filled out patient's discharge paperwork and written prescriptions.  Patient will receive  home health PT, OT, ST, Nursing and CNA.  DME provided:  None  Total discharge time: Greater than 30 minutes Greater than 50% was spent in counseling and coordination of care  with the patient.  Discharge time involved coordination of the discharge process with social worker, nursing staff and therapy department. Medical justification for home health services verified.    Ane Conerly C. [place] - NP   [employer] [phone]

## 2016-04-02 ENCOUNTER — Encounter: Payer: Self-pay | Admitting: Adult Health

## 2016-04-02 ENCOUNTER — Non-Acute Institutional Stay (SKILLED_NURSING_FACILITY): Payer: Medicare Other | Admitting: Adult Health

## 2016-04-02 DIAGNOSIS — I481 Persistent atrial fibrillation: Secondary | ICD-10-CM

## 2016-04-02 DIAGNOSIS — Z7901 Long term (current) use of anticoagulants: Secondary | ICD-10-CM | POA: Diagnosis not present

## 2016-04-02 DIAGNOSIS — I4819 Other persistent atrial fibrillation: Secondary | ICD-10-CM

## 2016-04-02 NOTE — Progress Notes (Signed)
Patient ID: Debra Lynn, female   DOB: Jan 14, 1929, 83 y.o.   MRN: [id number] Subjective:     Indication: atrial fibrillation Bleeding signs/symptoms: None Thromboembolic signs/symptoms: None  Missed Coumadin doses: This week - 2 due to supratherapeutic INR Medication changes: no Dietary changes: no Bacterial/viral infection: no Other concerns: no  The following portions of the patient's history were reviewed and updated as appropriate: allergies, current medications, past family history, past medical history, past social history, past surgical history and problem list.  Review of Systems A comprehensive review of systems was negative.   Objective:    INR Today: 2.9 Current dose: Coumadin 7.5 mg daily    Assessment:    Therapeutic INR for goal of 2-3   Plan:    1. New dose: Decrease Coumadin to 2.5 mg PO Q D   2. Next INR: 04/05/16

## 2016-04-06 ENCOUNTER — Ambulatory Visit (INDEPENDENT_AMBULATORY_CARE_PROVIDER_SITE_OTHER): Payer: Medicare Other | Admitting: Pharmacist

## 2016-04-06 DIAGNOSIS — I4819 Other persistent atrial fibrillation: Secondary | ICD-10-CM

## 2016-04-06 DIAGNOSIS — M6281 Muscle weakness (generalized): Secondary | ICD-10-CM | POA: Diagnosis not present

## 2016-04-06 DIAGNOSIS — N3000 Acute cystitis without hematuria: Secondary | ICD-10-CM | POA: Diagnosis not present

## 2016-04-06 DIAGNOSIS — I481 Persistent atrial fibrillation: Secondary | ICD-10-CM

## 2016-04-06 DIAGNOSIS — G20 Parkinson's disease: Secondary | ICD-10-CM | POA: Diagnosis not present

## 2016-04-06 DIAGNOSIS — J101 Influenza due to other identified influenza virus with other respiratory manifestations: Secondary | ICD-10-CM | POA: Diagnosis not present

## 2016-04-06 LAB — POCT INR: INR: 3.7

## 2016-04-08 ENCOUNTER — Telehealth: Payer: Self-pay | Admitting: Cardiology

## 2016-04-08 ENCOUNTER — Telehealth: Payer: Self-pay

## 2016-04-08 ENCOUNTER — Ambulatory Visit (INDEPENDENT_AMBULATORY_CARE_PROVIDER_SITE_OTHER): Payer: Medicare Other

## 2016-04-08 DIAGNOSIS — Z950 Presence of cardiac pacemaker: Secondary | ICD-10-CM | POA: Diagnosis not present

## 2016-04-08 DIAGNOSIS — I5022 Chronic systolic (congestive) heart failure: Secondary | ICD-10-CM | POA: Diagnosis not present

## 2016-04-08 NOTE — Telephone Encounter (Signed)
Remote ICM transmission received.  Attempted call to daughter and left message to return call regarding transmission.

## 2016-04-08 NOTE — Progress Notes (Signed)
EPIC Encounter for ICM Monitoring  Patient Name: Debra Lynn is a 83 y.o. female Date: 04/08/2016 Primary Care Physican: Aretta Nip, MD [facility] Electrophysiologist: Allred Dry Weight:unknown Bi-V Pacing: >99%      Attempted call to daughter [name].  Left voice mail message to return call.  Transmission reviewed.   Thoracic impedance abnormal suggesting fluid accumulation since 04/05/2016.  Prescribed dosage: Furosemide 40 mg 1 tablet daily and Potassium 20 mEq 1 tablet daily.  Labs: 03/18/2016 Creatinine 0.8,   BUN 18, Potassium 3.9, Sodium 141 03/14/2016 Creatinine 0.74, BUN 8,   Potassium 3.5, Sodium 137, EGFR >60 03/12/2016 Creatinine 0.88, BUN 14, Potassium 3.4, Sodium 138, EGFR 58 - >60 03/11/2016 Creatinine 0.95, BUN 17, Potassium 3.7, Sodium 138, EGFR 53 - >60 01/06/2016 Creatinine 0.9,   BUN 19.4, Potassium 4.4, Sodium 140, EGFR 57 10/01/2015 Creatinine 0.95, BUN 19, Potassium 4.4, Sodium 139 12/05/2016Creatinine 1.0,   BUN 19, Potassium 3.8, Sodium 142   Recommendations: NONE - Unable to reach patient   Follow-up plan: ICM clinic phone appointment on 04/16/2016.  Copy of ICM check sent to primary cardiologist and device physician.   3 month ICM trend: 04/08/2016   1 Year ICM trend:      [name], RN 04/08/2016 11:38 AM

## 2016-04-08 NOTE — Telephone Encounter (Signed)
Spoke with pt and reminded pt of remote transmission that is due today. Pt verbalized understanding.   

## 2016-04-09 NOTE — Progress Notes (Signed)
Returned call after receiving voice mail.  Spoke with daughter and transmission reviewed.  She reported patient had not taken the Lasix for the last few days due to PT was at the home.  Daughter tried to explain to patient she still needs to take the medication.  She reported patient is SOB.  Recommended: Advised for patient to resume taking her prescribed dosage of Furosemide daily.  Advised to increase Furosemide 40 mg 1 tablet to twice a day x 1-2 days if needed for SOB.  Also when taking extra Furosemide to take additional Potassium 20 mEq 1 tablet.  After 2nd day resume prescribed dosages.  Recheck fluid levels on 04/16/2016

## 2016-04-14 ENCOUNTER — Ambulatory Visit (INDEPENDENT_AMBULATORY_CARE_PROVIDER_SITE_OTHER): Payer: Medicare Other | Admitting: Pharmacist

## 2016-04-14 DIAGNOSIS — I481 Persistent atrial fibrillation: Secondary | ICD-10-CM

## 2016-04-14 DIAGNOSIS — I4819 Other persistent atrial fibrillation: Secondary | ICD-10-CM

## 2016-04-14 LAB — POCT INR: INR: 2.4

## 2016-04-16 ENCOUNTER — Ambulatory Visit (INDEPENDENT_AMBULATORY_CARE_PROVIDER_SITE_OTHER): Payer: Medicare Other

## 2016-04-16 DIAGNOSIS — I5022 Chronic systolic (congestive) heart failure: Secondary | ICD-10-CM

## 2016-04-16 DIAGNOSIS — Z950 Presence of cardiac pacemaker: Secondary | ICD-10-CM

## 2016-04-16 NOTE — Progress Notes (Signed)
EPIC Encounter for ICM Monitoring  Patient Name: Debra Lynn is a 83 y.o. female Date: 04/16/2016 Primary Care Physican: Aretta Nip, MD Primary [place] Electrophysiologist: Allred Dry Weight:unknown Bi-V Pacing: >99%       Spoke with daughter Debra Lynn.  Heart Failure questions reviewed, pt asymptomatic .   Thoracic impedance normal today but was abnormal suggesting fluid accumulation but improved after taking extra Furosemide x 2 days 04/08/2016.  Prescribed dosage: Furosemide 40 mg 1 tablet daily and Potassium 20 mEq 1 tablet daily.  Labs: 03/18/2016 Creatinine 0.8,   BUN 18, Potassium 3.9, Sodium 141 03/14/2016 Creatinine 0.74, BUN 8,   Potassium 3.5, Sodium 137, EGFR >60 03/12/2016 Creatinine 0.88, BUN 14, Potassium 3.4, Sodium 138, EGFR 58 - >60 03/11/2016 Creatinine 0.95, BUN 17, Potassium 3.7, Sodium 138, EGFR 53 - >60 01/06/2016 Creatinine 0.9,   BUN 19.4, Potassium 4.4, Sodium 140, EGFR 57 10/01/2015 Creatinine 0.95, BUN 19, Potassium 4.4, Sodium 139 12/05/2016Creatinine 1.0,   BUN 19, Potassium 3.8, Sodium 142   Recommendations: No changes. Reminded to limit dietary salt intake to 2000 mg/day and fluid intake to < 2 liters/day. Encouraged to call for fluid symptoms.  Follow-up plan: ICM clinic phone appointment on 05/11/2016.  Copy of ICM check sent to primary cardiologist and device physician.   3 month ICM trend: 04/16/2016   1 Year ICM trend:      [name], RN 04/16/2016 11:02 AM

## 2016-04-28 ENCOUNTER — Ambulatory Visit (INDEPENDENT_AMBULATORY_CARE_PROVIDER_SITE_OTHER): Payer: Self-pay | Admitting: Internal Medicine

## 2016-04-28 DIAGNOSIS — I481 Persistent atrial fibrillation: Secondary | ICD-10-CM

## 2016-04-28 DIAGNOSIS — I4819 Other persistent atrial fibrillation: Secondary | ICD-10-CM

## 2016-04-28 LAB — POCT INR: INR: 2.8

## 2016-05-06 ENCOUNTER — Other Ambulatory Visit: Payer: Self-pay | Admitting: Oncology

## 2016-05-11 ENCOUNTER — Ambulatory Visit (INDEPENDENT_AMBULATORY_CARE_PROVIDER_SITE_OTHER): Payer: Medicare Other

## 2016-05-11 DIAGNOSIS — Z950 Presence of cardiac pacemaker: Secondary | ICD-10-CM

## 2016-05-11 DIAGNOSIS — I5022 Chronic systolic (congestive) heart failure: Secondary | ICD-10-CM | POA: Diagnosis not present

## 2016-05-11 NOTE — Progress Notes (Signed)
EPIC Encounter for ICM Monitoring  Patient Name: Debra Lynn is a 83 y.o. female Date: 05/11/2016 Primary Care Physican: Aretta Nip, MD Primary [place] Electrophysiologist: Allred Dry Weight:unknown Bi-V Pacing: >99%      Attempted call to daughter [name].  Left detailed message regarding transmission.  Transmission reviewed.    Thoracic impedance normal   Prescribed dosage: Furosemide 40 mg 1 tablet daily and Potassium 20 mEq 1 tablet daily.  Labs: 03/18/2016 Creatinine 0.8, BUN 18,    Potassium 3.9, Sodium 141 03/14/2016 Creatinine 0.74, BUN 8,    Potassium 3.5, Sodium 137, EGFR >60 03/12/2016 Creatinine 0.88, BUN 14,    Potassium 3.4, Sodium 138, EGFR 58 - >60 03/11/2016 Creatinine 0.95, BUN 17,    Potassium 3.7, Sodium 138, EGFR 53 - >60 01/06/2016 Creatinine 0.9, BUN 19.4, Potassium 4.4, Sodium 140, EGFR 57 10/01/2015 Creatinine 0.95, BUN 19,    Potassium 4.4, Sodium 139 12/05/2016Creatinine 1.0, BUN 19,    Potassium 3.8, Sodium 142   Recommendations: Left voice mail with ICM number and encouraged to call for fluid symptoms.  Follow-up plan: ICM clinic phone appointment on 06/11/2016.    Copy of ICM check sent to device physician.   3 month ICM trend: 05/10/2016   1 Year ICM trend:      [name], RN 05/11/2016 8:40 AM

## 2016-05-19 ENCOUNTER — Ambulatory Visit (INDEPENDENT_AMBULATORY_CARE_PROVIDER_SITE_OTHER): Payer: Self-pay | Admitting: Internal Medicine

## 2016-05-19 DIAGNOSIS — I481 Persistent atrial fibrillation: Secondary | ICD-10-CM

## 2016-05-19 DIAGNOSIS — I4819 Other persistent atrial fibrillation: Secondary | ICD-10-CM

## 2016-05-19 LAB — POCT INR: INR: 2.3

## 2016-05-21 NOTE — Progress Notes (Signed)
ICM remote transmission rescheduled from 06/11/2016 to 06/14/2016.  

## 2016-06-14 ENCOUNTER — Ambulatory Visit (INDEPENDENT_AMBULATORY_CARE_PROVIDER_SITE_OTHER): Payer: Medicare Other

## 2016-06-14 DIAGNOSIS — I5022 Chronic systolic (congestive) heart failure: Secondary | ICD-10-CM

## 2016-06-14 DIAGNOSIS — Z950 Presence of cardiac pacemaker: Secondary | ICD-10-CM

## 2016-06-14 NOTE — Progress Notes (Signed)
EPIC Encounter for ICM Monitoring  Patient Name: Debra Lynn is a 83 y.o. female Date: 06/14/2016 Primary Care Physican: Aretta Nip, MD Primary [place] Electrophysiologist: Allred Dry Weight:unknown  Bi-V Pacing: >99%         Spoke to daughter [name].  Heart Failure questions reviewed, pt asymptomatic.   Thoracic impedance normal.  Prescribed dosage: Furosemide 40 mg 1 tablet daily and Potassium 20 mEq 1 tablet daily.  Labs: 03/18/2016 Creatinine 0.8, BUN 18,    Potassium 3.9, Sodium 141 03/14/2016 Creatinine 0.74, BUN 8,    Potassium 3.5, Sodium 137, EGFR >60 03/12/2016 Creatinine 0.88, BUN 14,    Potassium 3.4, Sodium 138, EGFR 58 - >60 03/11/2016 Creatinine 0.95, BUN 17,    Potassium 3.7, Sodium 138, EGFR 53 - >60 01/06/2016 Creatinine 0.9, BUN 19.4, Potassium 4.4, Sodium 140, EGFR 57 10/01/2015 Creatinine 0.95, BUN 19,    Potassium 4.4, Sodium 139 12/05/2016Creatinine 1.0, BUN 19,    Potassium 3.8, Sodium 142  Recommendations: No changes. Discussed to limit salt intake to 2000 mg/day and fluid intake to < 2 liters/day.  Encouraged to call for fluid symptoms or use local ER for any urgent symptoms.  Follow-up plan: ICM clinic phone appointment on 07/15/2016.  Copy of ICM check sent to device physician.   3 month ICM trend: 06/14/2016   1 Year ICM trend:      [name], RN 06/14/2016 12:34 PM

## 2016-06-17 ENCOUNTER — Ambulatory Visit (INDEPENDENT_AMBULATORY_CARE_PROVIDER_SITE_OTHER): Payer: Medicare Other | Admitting: *Deleted

## 2016-06-17 DIAGNOSIS — I6789 Other cerebrovascular disease: Secondary | ICD-10-CM

## 2016-06-17 DIAGNOSIS — I4891 Unspecified atrial fibrillation: Secondary | ICD-10-CM

## 2016-06-17 LAB — POCT INR: INR: 2.2

## 2016-06-25 ENCOUNTER — Other Ambulatory Visit: Payer: Self-pay | Admitting: Oncology

## 2016-07-15 ENCOUNTER — Ambulatory Visit (INDEPENDENT_AMBULATORY_CARE_PROVIDER_SITE_OTHER): Payer: Medicare Other

## 2016-07-15 DIAGNOSIS — I5022 Chronic systolic (congestive) heart failure: Secondary | ICD-10-CM

## 2016-07-15 DIAGNOSIS — Z950 Presence of cardiac pacemaker: Secondary | ICD-10-CM

## 2016-07-15 NOTE — Progress Notes (Signed)
EPIC Encounter for ICM Monitoring  Patient Name: Debra Lynn is a 83 y.o. female Date: 07/15/2016 Primary Care Physican: Rankins, Victoria R, MD Primary Cardiologist:[place] Electrophysiologist: Allred Dry Weight:unknown  Bi-V Pacing: >99%                                       Spoke todaughter Cynthia Wilcox. Heart Failure questions reviewed, pt asymptomatic.   Thoracic impedance abnormal since 07/05/2016 but close to baseline today.  Patient had UTI and increased fluid intake during decreased impedance.   Prescribed dosage: Furosemide 40 mg 1 tablet daily and Potassium 20 mEq 1 tablet daily.  Labs: 03/18/2016 Creatinine 0.8, BUN 18, Potassium 3.9, Sodium 141 03/14/2016 Creatinine 0.74, BUN 8,  Potassium 3.5, Sodium 137, EGFR >60 03/12/2016 Creatinine 0.88, BUN 14, Potassium 3.4, Sodium 138, EGFR 58 - >60 03/11/2016 Creatinine 0.95, BUN 17, Potassium 3.7, Sodium 138, EGFR 53 - >60 01/06/2016 Creatinine 0.9, BUN 19.4, Potassium 4.4, Sodium 140, EGFR 57 10/01/2015 Creatinine 0.95, BUN 19, Potassium 4.4, Sodium 139 12/05/2016Creatinine 1.0, BUN 19, Potassium 3.8, Sodium 142  Recommendations: No changes.  Advised to limit salt intake to 2000 mg/day and fluid intake to < 2 liters/day.  Encouraged to call for fluid symptoms.  Follow-up plan: ICM clinic phone appointment on 08/17/2016.    Copy of ICM check sent to cardiologist and device physician.   3 month ICM trend: 07/15/2016      1 Year ICM trend:       S , RN 07/15/2016 9:18 AM    

## 2016-07-16 ENCOUNTER — Other Ambulatory Visit: Payer: Self-pay | Admitting: *Deleted

## 2016-07-16 MED ORDER — ANASTROZOLE 1 MG PO TABS: 1.0000 mg | ORAL_TABLET | Freq: Every day | ORAL | 3 refills | Status: DC

## 2016-07-19 ENCOUNTER — Other Ambulatory Visit: Payer: Self-pay | Admitting: *Deleted

## 2016-07-19 MED ORDER — ANASTROZOLE 1 MG PO TABS: 1.0000 mg | ORAL_TABLET | Freq: Every day | ORAL | 3 refills | Status: DC

## 2016-07-26 ENCOUNTER — Telehealth: Payer: Self-pay

## 2016-07-26 NOTE — Telephone Encounter (Signed)
Reviewed remote transmission with daughter and it suggests fluid levels are normal.  She said her mother seems fine today and she thinks patient may have been upset thinking the monitor was not working.  No changes today.

## 2016-07-26 NOTE — Telephone Encounter (Signed)
Returned call to daughter, [name] as requested in voice mail message regarding transmission.  She stated patient was convinced that the lights on the monitor mean't it was not working.  Daughter explained how monitor works but that does not mean the device is not working.  She reported patient has not felt well in the last couple of days but no specific complaints.  Advised to send a transmission today for review.  Explained how to send remote transmission for review.  She said she is going to their house and will send transmission.

## 2016-07-29 ENCOUNTER — Ambulatory Visit (INDEPENDENT_AMBULATORY_CARE_PROVIDER_SITE_OTHER): Payer: Medicare Other

## 2016-07-29 DIAGNOSIS — I6789 Other cerebrovascular disease: Secondary | ICD-10-CM

## 2016-07-29 DIAGNOSIS — I4891 Unspecified atrial fibrillation: Secondary | ICD-10-CM | POA: Diagnosis not present

## 2016-07-29 LAB — POCT INR: INR: 2

## 2016-08-17 ENCOUNTER — Ambulatory Visit (INDEPENDENT_AMBULATORY_CARE_PROVIDER_SITE_OTHER): Payer: Medicare Other | Admitting: *Deleted

## 2016-08-17 DIAGNOSIS — I5022 Chronic systolic (congestive) heart failure: Secondary | ICD-10-CM | POA: Diagnosis not present

## 2016-08-17 DIAGNOSIS — Z950 Presence of cardiac pacemaker: Secondary | ICD-10-CM | POA: Diagnosis not present

## 2016-08-17 DIAGNOSIS — I442 Atrioventricular block, complete: Secondary | ICD-10-CM

## 2016-08-17 NOTE — Progress Notes (Signed)
EPIC Encounter for ICM Monitoring  Patient Name: Debra Lynn is a 83 y.o. female Date: 08/17/2016 Primary Care Physican: Aretta Nip, MD Primary [place] Electrophysiologist: Allred Dry Weight:unknown  Bi-V Pacing: >99%      Spoke with daughter [name]. Heart Failure questions reviewed, pt asymptomatic.   Thoracic impedance normal.  Prescribed dosage: Furosemide 40 mg 1 tablet daily and Potassium 20 mEq 1 tablet daily.  Labs: 03/18/2016 Creatinine 0.8, BUN 18, Potassium 3.9, Sodium 141 03/14/2016 Creatinine 0.74, BUN 8,  Potassium 3.5, Sodium 137, EGFR >60 03/12/2016 Creatinine 0.88, BUN 14, Potassium 3.4, Sodium 138, EGFR 58 - >60 03/11/2016 Creatinine 0.95, BUN 17, Potassium 3.7, Sodium 138, EGFR 53 - >60 01/06/2016 Creatinine 0.9, BUN 19.4, Potassium 4.4, Sodium 140, EGFR 57 10/01/2015 Creatinine 0.95, BUN 19, Potassium 4.4, Sodium 139 12/05/2016Creatinine 1.0, BUN 19, Potassium 3.8, Sodium 142  Recommendations: No changes.   Encouraged to call for fluid symptoms.  Follow-up plan: ICM clinic phone appointment on 09/17/2016.   Copy of ICM check sent to device physician.   3 month ICM trend: 08/17/2016   1 Year ICM trend:      [name], RN 08/17/2016 5:22 PM

## 2016-08-17 NOTE — Progress Notes (Signed)
Remote pacemaker transmission.   

## 2016-08-18 ENCOUNTER — Encounter: Payer: Self-pay | Admitting: Cardiology

## 2016-08-18 LAB — CUP PACEART REMOTE DEVICE CHECK
Battery Remaining Longevity: 78 mo
Battery Remaining Percentage: 95.5 %
Battery Voltage: 2.98 V
Brady Statistic AP VP Percent: 35 %
Brady Statistic AP VS Percent: 1 %
Brady Statistic AS VP Percent: 65 %
Brady Statistic AS VS Percent: 1 %
Brady Statistic RA Percent Paced: 34 %
Date Time Interrogation Session: 20180717124907
Implantable Lead Implant Date: 20081223
Implantable Lead Implant Date: 20081223
Implantable Lead Implant Date: 20081223
Implantable Lead Location: 753858
Implantable Lead Location: 753859
Implantable Lead Location: 753860
Implantable Lead Model: 5024
Implantable Lead Model: 5524
Implantable Pulse Generator Implant Date: 20160526
Lead Channel Impedance Value: 1050 Ohm
Lead Channel Impedance Value: 1150 Ohm
Lead Channel Impedance Value: 560 Ohm
Lead Channel Pacing Threshold Amplitude: 0.75 V
Lead Channel Pacing Threshold Amplitude: 1.25 V
Lead Channel Pacing Threshold Amplitude: 1.875 V
Lead Channel Pacing Threshold Pulse Width: 0.5 ms
Lead Channel Pacing Threshold Pulse Width: 0.7 ms
Lead Channel Pacing Threshold Pulse Width: 0.8 ms
Lead Channel Sensing Intrinsic Amplitude: 11.2 mV
Lead Channel Sensing Intrinsic Amplitude: 2.7 mV
Lead Channel Setting Pacing Amplitude: 1.75 V
Lead Channel Setting Pacing Amplitude: 2.25 V
Lead Channel Setting Pacing Amplitude: 2.875
Lead Channel Setting Pacing Pulse Width: 0.7 ms
Lead Channel Setting Pacing Pulse Width: 0.8 ms
Lead Channel Setting Sensing Sensitivity: 4.5 mV
Pulse Gen Model: 3222
Pulse Gen Serial Number: 7738731

## 2016-08-23 ENCOUNTER — Other Ambulatory Visit: Payer: Self-pay | Admitting: Internal Medicine

## 2016-08-23 NOTE — Telephone Encounter (Signed)
Last OV: 10/23/15; Pt was told to return as needed. Please advise on refill request. Thanks.

## 2016-08-23 NOTE — Telephone Encounter (Signed)
Ok to refill x 6  

## 2016-09-01 ENCOUNTER — Encounter: Payer: Self-pay | Admitting: Cardiology

## 2016-09-09 ENCOUNTER — Ambulatory Visit (INDEPENDENT_AMBULATORY_CARE_PROVIDER_SITE_OTHER): Payer: Medicare Other

## 2016-09-09 DIAGNOSIS — I6789 Other cerebrovascular disease: Secondary | ICD-10-CM

## 2016-09-09 DIAGNOSIS — I4891 Unspecified atrial fibrillation: Secondary | ICD-10-CM

## 2016-09-09 LAB — POCT INR: INR: 1.9

## 2016-09-17 ENCOUNTER — Ambulatory Visit (INDEPENDENT_AMBULATORY_CARE_PROVIDER_SITE_OTHER): Payer: Medicare Other

## 2016-09-17 DIAGNOSIS — Z950 Presence of cardiac pacemaker: Secondary | ICD-10-CM

## 2016-09-17 DIAGNOSIS — I5022 Chronic systolic (congestive) heart failure: Secondary | ICD-10-CM

## 2016-09-17 NOTE — Progress Notes (Signed)
EPIC Encounter for ICM Monitoring  Patient Name: Debra Lynn Patient is a 83 y.o. female Date: 09/17/2016 Primary Care Physican: Rankins, Victoria R, MD Primary Cardiologist:[place] Electrophysiologist: Allred Dry Weight:unknown  Bi-V Pacing: >99%  Spoketodaughter Cynthia Wilcox.   Heart Failure questions reviewed, pt asymptomatic    Thoracic impedance normal.  Prescribed dosage: Furosemide 40 mg 1 tablet daily and Potassium 20 mEq 1 tablet daily.  Labs: 03/18/2016 Creatinine 0.8, BUN 18, Potassium 3.9, Sodium 141 03/14/2016 Creatinine 0.74, BUN 8,  Potassium 3.5, Sodium 137, EGFR >60 03/12/2016 Creatinine 0.88, BUN 14, Potassium 3.4, Sodium 138, EGFR 58 - >60 03/11/2016 Creatinine 0.95, BUN 17, Potassium 3.7, Sodium 138, EGFR 53 - >60 01/06/2016 Creatinine 0.9, BUN 19.4, Potassium 4.4, Sodium 140, EGFR 57 10/01/2015 Creatinine 0.95, BUN 19, Potassium 4.4, Sodium 139 12/05/2016Creatinine 1.0, BUN 19, Potassium 3.8, Sodium 142  Recommendations: No changes.  Encouraged to call for fluid symptoms.  Follow-up plan: ICM clinic phone appointment on 11/18/2016.  Office appointment scheduled 10/18/2016 with Dr. Allred.  Copy of ICM check sent to device physician.   3 month ICM trend: 09/17/2016   1 Year ICM trend:       S , RN 09/17/2016 7:43 AM    

## 2016-09-22 ENCOUNTER — Telehealth: Payer: Self-pay | Admitting: Oncology

## 2016-09-22 NOTE — Telephone Encounter (Signed)
Spoke to patients daughter about 12/13 appointment.

## 2016-10-04 ENCOUNTER — Other Ambulatory Visit: Payer: Self-pay | Admitting: Interventional Cardiology

## 2016-10-08 ENCOUNTER — Ambulatory Visit (INDEPENDENT_AMBULATORY_CARE_PROVIDER_SITE_OTHER): Payer: Medicare Other | Admitting: *Deleted

## 2016-10-08 DIAGNOSIS — Z8673 Personal history of transient ischemic attack (TIA), and cerebral infarction without residual deficits: Secondary | ICD-10-CM

## 2016-10-08 DIAGNOSIS — I481 Persistent atrial fibrillation: Secondary | ICD-10-CM

## 2016-10-08 DIAGNOSIS — I6789 Other cerebrovascular disease: Secondary | ICD-10-CM | POA: Diagnosis not present

## 2016-10-08 DIAGNOSIS — I4819 Other persistent atrial fibrillation: Secondary | ICD-10-CM

## 2016-10-08 DIAGNOSIS — I4891 Unspecified atrial fibrillation: Secondary | ICD-10-CM

## 2016-10-08 DIAGNOSIS — Z5181 Encounter for therapeutic drug level monitoring: Secondary | ICD-10-CM

## 2016-10-08 LAB — POCT INR: INR: 2.3

## 2016-10-18 ENCOUNTER — Encounter: Payer: Self-pay | Admitting: Internal Medicine

## 2016-10-18 ENCOUNTER — Ambulatory Visit (INDEPENDENT_AMBULATORY_CARE_PROVIDER_SITE_OTHER): Payer: Medicare Other | Admitting: Internal Medicine

## 2016-10-18 VITALS — BP 106/66 | HR 80 | Ht 63.0 in | Wt 148.4 lb

## 2016-10-18 DIAGNOSIS — I5022 Chronic systolic (congestive) heart failure: Secondary | ICD-10-CM

## 2016-10-18 DIAGNOSIS — I442 Atrioventricular block, complete: Secondary | ICD-10-CM

## 2016-10-18 DIAGNOSIS — I480 Paroxysmal atrial fibrillation: Secondary | ICD-10-CM | POA: Diagnosis not present

## 2016-10-18 NOTE — Patient Instructions (Addendum)
Medication Instructions:  Your physician recommends that you continue on your current medications as directed. Please refer to the Current Medication list given to you today.   Labwork: None ordered   Testing/Procedures: None ordered   Follow-Up: Your physician wants you to follow-up in: 12 months with Debra Marshall, NP. You will receive a reminder letter in the mail two months in advance. If you don't receive a letter, please call our office to schedule the follow-up appointment.  Remote monitoring is used to monitor your Pacemaker from home. This monitoring reduces the number of office visits required to check your device to one time per year. It allows [place] to keep an eye on the functioning of your device to ensure it is working properly. You are scheduled for a device check from home on 11/18/16. You may send your transmission at any time that day. If you have a wireless device, the transmission will be sent automatically. After your physician reviews your transmission, you will receive a postcard with your next transmission date.    Any Other Special Instructions Will Be Listed Below (If Applicable).     If you need a refill on your cardiac medications before your next appointment, please call your pharmacy.

## 2016-10-18 NOTE — Progress Notes (Signed)
PCP: Aretta Nip, MD Primary Cardiologist:  Dr Irish Lack Primary EP:  Dr Rayann Heman  Debra Lynn is a 83 y.o. female who presents today for routine electrophysiology followup.  Since last being seen in our clinic, the patient reports doing very well.  Today, she denies symptoms of palpitations, chest pain, shortness of breath,  lower extremity edema, dizziness, presyncope, or syncope.  The patient is otherwise without complaint today.   Past Medical History:  Diagnosis Date  . Acute ischemic stroke ([place]) 08/23/2012  . Acute, but ill-defined, cerebrovascular disease 11/08/2012  . Arthritis    . Atrial fibrillation ([place])    a. s/p AVN ablation  . Cancer [facility]) 12/2010   breast with recurrence s/p recent XRT  . Complete heart block ([facility])    a. s/p STJ CRTP  . Compression fracture        . CVA 05/07/2008      . Fibrocystic breast   . GERD (gastroesophageal reflux disease)   . Glaucoma   . Hyperlipidemia   . Hypertension   . Orthostatic hypotension   . Overactive bladder   . Parkinsonism ([place])   . Pericarditis       . 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 Seasonal allergic rhinitis    Pos Skin Test 07-05-07  . Seizures ([place]) 2013      . Thoracic aneurysm without mention of rupture    Past Surgical History:  Procedure Laterality Date  . ABDOMINAL HYSTERECTOMY  1975   pt. unsure if laparascopic/vaginal/abdominal  . ABLATION  1980s   AVN ablation   . APPENDECTOMY    . BI-VENTRICULAR PACEMAKER UPGRADE  01/2007   3rd device, upgraded to SJM Frontier II BiV pacemaker by Dr Leonia Reeves  . BREAST SURGERY Right 01/15/11   mastectomy  . CATARACT EXTRACTION Bilateral   . CHOLECYSTECTOMY    . EP IMPLANTABLE DEVICE N/A 06/27/2014   CRT-P  ([facility]) gen change by Dr Rayann Heman  . EYE SURGERY     For glaucoma  . FLEXIBLE BRONCHOSCOPY W/ UPPER ENDOSCOPY     and swallowing evaluation  . KNEE ARTHROSCOPY Right    Arthroscopic for torn meniscus  . MASS EXCISION Right  01/19/2013   Procedure: EXCISION CHEST WALL MASS;  Surgeon: [name], MD;  Location: [facility];  Service: General;  Laterality: Right;  . PACEMAKER INSERTION     1992; gen change 2004  . VESICOVAGINAL FISTULA CLOSURE W/ TAH      ROS- all systems are reviewed and negative except as per HPI above  Current Outpatient Prescriptions  Medication Sig Dispense Refill  . acetaminophen (TYLENOL) 500 MG tablet Take 1,000 mg by mouth every 6 (six) hours as needed for moderate pain (pain).     Marland Kitchen albuterol (PROVENTIL) (2.5 MG/3ML) 0.083% nebulizer solution Take 3 mLs (2.5 mg total) by nebulization every 6 (six) hours as needed for wheezing or shortness of breath. 75 mL 12  . anastrozole (ARIMIDEX) 1 MG tablet Take 1 tablet (1 mg total) by mouth daily. 90 tablet 3  . benzonatate (TESSALON) 100 MG capsule TAKE 1 CAPSULE BY MOUTH 3 TIMES A DAY AS NEEDED FOR COUGH 90 capsule 4  . Calcium Carbonate-Vitamin D (CALCIUM-VITAMIN D) 500-200 MG-UNIT per tablet Take 1 tablet by mouth daily.     . cephALEXin (KEFLEX) 250 MG capsule Take 250 mg by mouth at bedtime.    . colestipol (COLESTID) 1 G tablet Take 1 g by mouth at bedtime.    Marland Kitchen  fexofenadine (ALLEGRA) 180 MG tablet Take 180 mg by mouth daily.    . furosemide (LASIX) 40 MG tablet TAKE 1 TABLET BY MOUTH  EVERY MORNING 90 tablet 3  . levETIRAcetam (KEPPRA) 250 MG tablet Take 250 mg by mouth 2 (two) times daily.    . Multiple Vitamin (MULTIVITAMIN WITH MINERALS) TABS tablet Take 1 tablet by mouth daily.    . pantoprazole (PROTONIX) 40 MG tablet Take 1 tablet by mouth daily.    . potassium chloride SA (K-DUR,KLOR-CON) 20 MEQ tablet TAKE 1 TABLET BY MOUTH  DAILY 90 tablet 3  . pravastatin (PRAVACHOL) 80 MG tablet TAKE 1 TABLET BY MOUTH  DAILY 90 tablet 3  . Probiotic Product (ALIGN) 4 MG CAPS Take 4 mg by mouth daily. 30 capsule 0  . propranolol (INDERAL) 60 MG tablet TAKE ONE-HALF TABLET BY  MOUTH TWICE A DAY 90 tablet 3  . warfarin (COUMADIN) 2.5 MG tablet  as directed.      No current facility-administered medications for this visit.     Physical Exam: Vitals:   10/18/16 1426  BP: 106/66  Pulse: 80  SpO2: 94%  Weight: 148 lb 6.4 oz (67.3 kg)  Height: 5\' 3"  (1.6 m)    GEN- The patient is well appearing, alert and oriented x 3 today.   Head- normocephalic, atraumatic Eyes-  Sclera clear, conjunctiva pink Ears- hearing intact Oropharynx- clear Lungs- Clear to ausculation bilaterally, normal work of breathing Chest- pacemaker pocket is well healed Heart- Regular rate and rhythm, no murmurs, rubs or gallops, PMI not laterally displaced GI- soft, NT, ND, + BS Extremities- no clubbing, cyanosis, or edema  Pacemaker interrogation- reviewed in detail today,  See PACEART report  ekg tracing 03/12/16 is personally reviewed and shows sinus rhythm, LAA, V paced  Assessment and Plan:  1. Symptomatic complete heart block Normal BiV pacemaker function See Pace Art report No changes today  2 chronic systolic dysfunction Good response to CRT Followed in ICM device clinic  3. HTN Stable No change required today  4. afib Well controlled controlled (no sustained events since last interrogation) On coumadin  [place] Return to see EP NP every year Follow-up with Dr Irish Lack as scheduled  Thompson Grayer MD, [facility] 10/18/2016 2:32 PM

## 2016-10-26 LAB — CUP PACEART INCLINIC DEVICE CHECK
Battery Remaining Longevity: 68 mo
Battery Voltage: 2.96 V
Brady Statistic RA Percent Paced: 34 %
Brady Statistic RV Percent Paced: 99.67 %
Date Time Interrogation Session: 20180917183206
Implantable Lead Implant Date: 20081223
Implantable Lead Implant Date: 20081223
Implantable Lead Implant Date: 20081223
Implantable Lead Location: 753858
Implantable Lead Location: 753859
Implantable Lead Location: 753860
Implantable Lead Model: 5024
Implantable Lead Model: 5524
Implantable Pulse Generator Implant Date: 20160526
Lead Channel Impedance Value: 1037.5 Ohm
Lead Channel Impedance Value: 1037.5 Ohm
Lead Channel Impedance Value: 512.5 Ohm
Lead Channel Pacing Threshold Amplitude: 0.75 V
Lead Channel Pacing Threshold Amplitude: 0.75 V
Lead Channel Pacing Threshold Amplitude: 1.5 V
Lead Channel Pacing Threshold Amplitude: 1.5 V
Lead Channel Pacing Threshold Amplitude: 2.25 V
Lead Channel Pacing Threshold Amplitude: 2.25 V
Lead Channel Pacing Threshold Pulse Width: 0.5 ms
Lead Channel Pacing Threshold Pulse Width: 0.5 ms
Lead Channel Pacing Threshold Pulse Width: 0.7 ms
Lead Channel Pacing Threshold Pulse Width: 0.7 ms
Lead Channel Pacing Threshold Pulse Width: 0.8 ms
Lead Channel Pacing Threshold Pulse Width: 0.8 ms
Lead Channel Sensing Intrinsic Amplitude: 2.9 mV
Lead Channel Sensing Intrinsic Amplitude: 8.5 mV
Lead Channel Setting Pacing Amplitude: 2.25 V
Lead Channel Setting Pacing Amplitude: 2.375
Lead Channel Setting Pacing Amplitude: 3.125
Lead Channel Setting Pacing Pulse Width: 0.7 ms
Lead Channel Setting Pacing Pulse Width: 0.8 ms
Lead Channel Setting Sensing Sensitivity: 4.5 mV
Pulse Gen Model: 3222
Pulse Gen Serial Number: 7738731

## 2016-11-18 ENCOUNTER — Ambulatory Visit (INDEPENDENT_AMBULATORY_CARE_PROVIDER_SITE_OTHER): Payer: Medicare Other | Admitting: *Deleted

## 2016-11-18 DIAGNOSIS — I5022 Chronic systolic (congestive) heart failure: Secondary | ICD-10-CM

## 2016-11-18 DIAGNOSIS — I6789 Other cerebrovascular disease: Secondary | ICD-10-CM | POA: Diagnosis not present

## 2016-11-18 DIAGNOSIS — Z5181 Encounter for therapeutic drug level monitoring: Secondary | ICD-10-CM

## 2016-11-18 DIAGNOSIS — Z8673 Personal history of transient ischemic attack (TIA), and cerebral infarction without residual deficits: Secondary | ICD-10-CM | POA: Diagnosis not present

## 2016-11-18 DIAGNOSIS — I442 Atrioventricular block, complete: Secondary | ICD-10-CM

## 2016-11-18 DIAGNOSIS — I4891 Unspecified atrial fibrillation: Secondary | ICD-10-CM

## 2016-11-18 DIAGNOSIS — Z950 Presence of cardiac pacemaker: Secondary | ICD-10-CM

## 2016-11-18 DIAGNOSIS — I481 Persistent atrial fibrillation: Secondary | ICD-10-CM

## 2016-11-18 DIAGNOSIS — I4819 Other persistent atrial fibrillation: Secondary | ICD-10-CM

## 2016-11-18 LAB — POCT INR: INR: 1.7

## 2016-11-18 NOTE — Progress Notes (Signed)
EPIC Encounter for ICM Monitoring  Patient Name: Debra Lynn is a 83 y.o. female Date: 11/18/2016 Primary Care Physican: Aretta Nip, MD Primary [place] Electrophysiologist: Allred Dry Weight:unknown  Bi-V Pacing: >99%  Spoketodaughter [name].    Heart Failure questions reviewed, pt asymptomatic.   Thoracic impedance reference line and daily impedance is elevated.  Unsure reason for elevation but does not appear to have fluid accumulation.   Device nurse reviewed report as well today.   Prescribed dosage: Furosemide 40 mg 1 tablet daily and Potassium 20 mEq 1 tablet daily.  Labs: 03/18/2016 Creatinine 0.8, BUN 18, Potassium 3.9, Sodium 141 03/14/2016 Creatinine 0.74, BUN 8,  Potassium 3.5, Sodium 137, EGFR >60 03/12/2016 Creatinine 0.88, BUN 14, Potassium 3.4, Sodium 138, EGFR 58 - >60 03/11/2016 Creatinine 0.95, BUN 17, Potassium 3.7, Sodium 138, EGFR 53 - >60 01/06/2016 Creatinine 0.9, BUN 19.4, Potassium 4.4, Sodium 140, EGFR 57 10/01/2015 Creatinine 0.95, BUN 19, Potassium 4.4, Sodium 139 12/05/2016Creatinine 1.0, BUN 19, Potassium 3.8, Sodium 142  Recommendations: No changes.   Encouraged to call for fluid symptoms.  Follow-up plan: ICM clinic phone appointment on 12/20/2016.  Office appointment scheduled 12/21/2016 with Dr. Irish Lack.  Copy of ICM check sent to Dr. Rayann Heman.   3 month ICM trend: 11/18/2016   1 Year ICM trend:      [name], RN 11/18/2016 2:11 PM

## 2016-11-18 NOTE — Progress Notes (Signed)
Remote pacemaker transmission.   

## 2016-11-25 ENCOUNTER — Encounter: Payer: Self-pay | Admitting: Cardiology

## 2016-12-07 ENCOUNTER — Ambulatory Visit (INDEPENDENT_AMBULATORY_CARE_PROVIDER_SITE_OTHER): Payer: Medicare Other | Admitting: *Deleted

## 2016-12-07 DIAGNOSIS — I481 Persistent atrial fibrillation: Secondary | ICD-10-CM

## 2016-12-07 DIAGNOSIS — I4819 Other persistent atrial fibrillation: Secondary | ICD-10-CM

## 2016-12-07 DIAGNOSIS — I6789 Other cerebrovascular disease: Secondary | ICD-10-CM

## 2016-12-07 DIAGNOSIS — I4891 Unspecified atrial fibrillation: Secondary | ICD-10-CM | POA: Diagnosis not present

## 2016-12-07 DIAGNOSIS — Z5181 Encounter for therapeutic drug level monitoring: Secondary | ICD-10-CM | POA: Diagnosis not present

## 2016-12-07 LAB — POCT INR: INR: 2

## 2016-12-10 ENCOUNTER — Encounter: Payer: Self-pay | Admitting: Interventional Cardiology

## 2016-12-10 LAB — CUP PACEART REMOTE DEVICE CHECK
Battery Remaining Longevity: 77 mo
Battery Remaining Percentage: 95.5 %
Battery Voltage: 2.98 V
Brady Statistic AP VP Percent: 26 %
Brady Statistic AP VS Percent: 1 %
Brady Statistic AS VP Percent: 74 %
Brady Statistic AS VS Percent: 1 %
Brady Statistic RA Percent Paced: 22 %
Date Time Interrogation Session: 20181018060012
Implantable Lead Implant Date: 20081223
Implantable Lead Implant Date: 20081223
Implantable Lead Implant Date: 20081223
Implantable Lead Location: 753858
Implantable Lead Location: 753859
Implantable Lead Location: 753860
Implantable Lead Model: 5024
Implantable Lead Model: 5524
Implantable Pulse Generator Implant Date: 20160526
Lead Channel Impedance Value: 1100 Ohm
Lead Channel Impedance Value: 1125 Ohm
Lead Channel Impedance Value: 550 Ohm
Lead Channel Pacing Threshold Amplitude: 1.375 V
Lead Channel Pacing Threshold Amplitude: 1.375 V
Lead Channel Pacing Threshold Amplitude: 2.125 V
Lead Channel Pacing Threshold Pulse Width: 0.5 ms
Lead Channel Pacing Threshold Pulse Width: 0.7 ms
Lead Channel Pacing Threshold Pulse Width: 0.8 ms
Lead Channel Sensing Intrinsic Amplitude: 2.9 mV
Lead Channel Sensing Intrinsic Amplitude: 8.5 mV
Lead Channel Setting Pacing Amplitude: 2.375
Lead Channel Setting Pacing Amplitude: 2.375
Lead Channel Setting Pacing Amplitude: 3.125
Lead Channel Setting Pacing Pulse Width: 0.7 ms
Lead Channel Setting Pacing Pulse Width: 0.8 ms
Lead Channel Setting Sensing Sensitivity: 4.5 mV
Pulse Gen Model: 3222
Pulse Gen Serial Number: 7738731

## 2016-12-18 NOTE — Progress Notes (Signed)
Cardiology Office Note   Date:  12/21/2016   ID:  Debra Lynn, DOB 26-May-1928, MRN [id number]  PCP:  Aretta Nip, MD    No chief complaint on file. Afib   Wt Readings from Last 3 Encounters:  12/21/16 153 lb (69.4 kg)  10/18/16 148 lb 6.4 oz (67.3 kg)  04/02/16 155 lb (70.3 kg)       History of Present Illness: Debra Lynn is a 83 y.o. female  with pacemaker and atrial fibrillation and prior CVA. She has had unsteadiness and lightheadedness as well. She sees a  neurologist for her neuropathy.   She used to have this issue with standing. Now it is more frequent and not always dependent on change in positions. Lying down gives relief. SHe is walking with a walker, now most of the time.   She had a chest wall cancer recurrence.  She had the flu in Feb 2018 and was hospitalized.  Denies : Chest pain. Leg edema. Nitroglycerin use. Orthopnea. Palpitations. Paroxysmal nocturnal dyspnea. Shortness of breath. Syncope.   Some dizziness when she stands.  Systolic in the 841-324 range.      Past Medical History:  Diagnosis Date  . Acute ischemic stroke ([place]) 08/23/2012  . Acute, but ill-defined, cerebrovascular disease 11/08/2012  . Arthritis    . Atrial fibrillation ([place])    a. s/p AVN ablation  . Cancer [facility]) 12/2010   breast with recurrence s/p recent XRT  . Complete heart block (HCC)    a. s/p STJ CRTP  . Compression fracture        . CVA 05/07/2008      . Fibrocystic breast   . GERD (gastroesophageal reflux disease)   . Glaucoma   . Hyperlipidemia   . Hypertension   . Orthostatic hypotension   . Overactive bladder   . Parkinsonism ([place])   . Pericarditis       . 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 Seasonal allergic rhinitis    Pos Skin Test 07-05-07  . Seizures ([place]) 2013      . Thoracic aneurysm without mention of rupture     Past Surgical History:  Procedure Laterality Date  . ABDOMINAL HYSTERECTOMY  1975   pt. unsure if laparascopic/vaginal/abdominal  . ABLATION  1980s   AVN ablation   . APPENDECTOMY    . BI-VENTRICULAR PACEMAKER UPGRADE  01/2007   3rd device, upgraded to SJM Frontier II BiV pacemaker by Dr Leonia Reeves  . BREAST SURGERY Right 01/15/11   mastectomy  . CATARACT EXTRACTION Bilateral   . CHOLECYSTECTOMY    . EP IMPLANTABLE DEVICE N/A 06/27/2014   CRT-P  (SJM) gen change by Dr Rayann Heman  . EYE SURGERY     For glaucoma  . FLEXIBLE BRONCHOSCOPY W/ UPPER ENDOSCOPY     and swallowing evaluation  . KNEE ARTHROSCOPY Right    Arthroscopic for torn meniscus  . MASS EXCISION Right 01/19/2013   Procedure: EXCISION CHEST WALL MASS;  Surgeon: [name], MD;  Location: [facility];  Service: General;  Laterality: Right;  . PACEMAKER INSERTION     1992; gen change 2004  . VESICOVAGINAL FISTULA CLOSURE W/ TAH       Current Outpatient Medications  Medication Sig Dispense Refill  . acetaminophen (TYLENOL) 500 MG tablet Take 1,000 mg by mouth every 6 (six) hours as needed for moderate pain (pain).     Marland Kitchen albuterol (PROVENTIL) (2.5 MG/3ML) 0.083% nebulizer solution Take 3  mLs (2.5 mg total) by nebulization every 6 (six) hours as needed for wheezing or shortness of breath. 75 mL 12  . anastrozole (ARIMIDEX) 1 MG tablet Take 1 tablet (1 mg total) by mouth daily. 90 tablet 3  . benzonatate (TESSALON) 100 MG capsule TAKE 1 CAPSULE BY MOUTH 3 TIMES A DAY AS NEEDED FOR COUGH 90 capsule 4  . Calcium Carbonate-Vitamin D (CALCIUM-VITAMIN D) 500-200 MG-UNIT per tablet Take 1 tablet by mouth daily.     . cephALEXin (KEFLEX) 250 MG capsule Take 250 mg by mouth at bedtime.    . colestipol (COLESTID) 1 G tablet Take 1 g by mouth at bedtime.    . fexofenadine (ALLEGRA) 180 MG tablet Take 180 mg by mouth daily.    . furosemide (LASIX) 40 MG tablet TAKE 1 TABLET BY MOUTH  EVERY MORNING 90 tablet 3  . levETIRAcetam (KEPPRA) 250 MG tablet Take 250 mg by mouth 2 (two) times daily.    . Multiple Vitamin  (MULTIVITAMIN WITH MINERALS) TABS tablet Take 1 tablet by mouth daily.    . pantoprazole (PROTONIX) 40 MG tablet Take 1 tablet by mouth daily.    . potassium chloride SA (K-DUR,KLOR-CON) 20 MEQ tablet TAKE 1 TABLET BY MOUTH  DAILY 90 tablet 3  . pravastatin (PRAVACHOL) 80 MG tablet TAKE 1 TABLET BY MOUTH  DAILY 90 tablet 3  . Probiotic Product (ALIGN) 4 MG CAPS Take 4 mg by mouth daily. 30 capsule 0  . propranolol (INDERAL) 60 MG tablet TAKE ONE-HALF TABLET BY  MOUTH TWICE A DAY 90 tablet 3  . warfarin (COUMADIN) 2.5 MG tablet as directed.      No current facility-administered medications for this visit.     Allergies:   Debra Lynn has no known allergies.    Social History:  The Debra Lynn  reports that  has never smoked. she has never used smokeless tobacco. She reports that she does not drink alcohol or use drugs.   Family History:  The Debra Lynn's family history includes Asthma in her brother and daughter; Cancer in her father and unknown relative; Diabetes in her brother; Heart attack in her brother; Heart disease in her unknown relative; Heart failure in her mother; Hypertension in her brother; Lymphoma in her brother.    ROS:  Please see the history of present illness.   Otherwise, review of systems are positive for right leg pain.   All other systems are reviewed and negative.    PHYSICAL EXAM: VS:  BP 110/70   Pulse 73   Ht 5\' 3"  (1.6 m)   Wt 153 lb (69.4 kg)   SpO2 92%   BMI 27.10 kg/m  , BMI Body mass index is 27.1 kg/m. GEN: Well nourished, well developed, in no acute distress  HEENT: normal  Neck: no JVD, carotid bruits, or masses Cardiac: RRR; no murmurs, rubs, or gallops,no edema  Respiratory:  clear to auscultation bilaterally, normal work of breathing GI: soft, nontender, nondistended, + BS MS: no deformity or atrophy  Skin: warm and dry, no rash Neuro:  Strength and sensation are intact Psych: euthymic mood, full affect   EKG:   The ekg ordered today demonstrates  A sensed V paced.   Recent Labs: 03/18/2016: ALT 35; BUN 18; Creatinine 0.8; Hemoglobin 13.0; Platelets 183; Potassium 3.9; Sodium 141   Lipid Panel    Component Value Date/Time   CHOL 186 07/22/2014 0921   TRIG 274.0 (H) 07/22/2014 0921   HDL 59.00 07/22/2014 0921   CHOLHDL  3 07/22/2014 0921   VLDL 54.8 (H) 07/22/2014 0921   LDLCALC 97 06/21/2013 1001   LDLDIRECT 84.0 07/22/2014 0921     Other studies Reviewed: Additional studies/ records that were reviewed today with results demonstrating: labs.   ASSESSMENT AND PLAN:  1. AFib: In a normal rhythm at this time.  COumadin for stroke prevention.  2. Hyperlipidemia: LDL controlled in 2017, 87.  TG 298.  HDL 57.  Eat healthy diet.  COntinue pravastatin. 3. HTN: Controlled. Watch for low BP given feeling of lightheadedness with standing.  Stay well hydrated.  Continue Lasix as she has had elevated fluid levels by pacer check noted in the past.  4. DOE: Chronic.  Stable. 5. Continue pacer checks.    Current medicines are reviewed at length with the Debra Lynn today.  The Debra Lynn concerns regarding her medicines were addressed.  The following changes have been made:  No change  Labs/ tests ordered today include:  No orders of the defined types were placed in this encounter.   Recommend 150 minutes/week of aerobic exercise Low fat, low carb, high fiber diet recommended  Disposition:   FU in 1 year   Signed, Larae Grooms, MD  12/21/2016 1:38 PM    [facility] [place], [place], [place]  [address] Phone: [phone]; Fax: [phone]

## 2016-12-20 ENCOUNTER — Ambulatory Visit (INDEPENDENT_AMBULATORY_CARE_PROVIDER_SITE_OTHER): Payer: Medicare Other

## 2016-12-20 DIAGNOSIS — Z950 Presence of cardiac pacemaker: Secondary | ICD-10-CM

## 2016-12-20 DIAGNOSIS — I5022 Chronic systolic (congestive) heart failure: Secondary | ICD-10-CM

## 2016-12-20 NOTE — Progress Notes (Signed)
shoffEPIC Encounter for ICM Monitoring  Patient Name: Debra Lynn is a 83 y.o. female Date: 12/20/2016 Primary Care Physican: Aretta Nip, MD Primary [place] Electrophysiologist: Allred Dry Weight:does not weigh Bi-V Pacing: >99%  AT/AF Burden: 7.1%  Spoketodaughter [name].  Heart Failure questions reviewed, pt asymptomatic.   Thoracic impedance normal today but was abnormal for the last 10 days, no symptoms.   Per Merlin rep, patient has high impedance leads so her baseline (reference line) normal is is 1600 oms.  The [place] readings will be evaluated from the perspective of 1600 oms baseline is the norm for her.   Prescribed dosage: Furosemide 40 mg 1 tablet daily and Potassium 20 mEq 1 tablet daily.  Labs: 03/18/2016 Creatinine 0.8, BUN 18, Potassium 3.9, Sodium 141 03/14/2016 Creatinine 0.74, BUN 8,  Potassium 3.5, Sodium 137, EGFR >60 03/12/2016 Creatinine 0.88, BUN 14, Potassium 3.4, Sodium 138, EGFR 58 - >60 03/11/2016 Creatinine 0.95, BUN 17, Potassium 3.7, Sodium 138, EGFR 53 - >60 01/06/2016 Creatinine 0.9, BUN 19.4, Potassium 4.4, Sodium 140, EGFR 57 10/01/2015 Creatinine 0.95, BUN 19, Potassium 4.4, Sodium 139 12/05/2016Creatinine 1.0, BUN 19, Potassium 3.8, Sodium 142  Recommendations:  No changes Encouraged to call for fluid symptoms.  Follow-up plan: ICM clinic phone appointment on 01/20/2017.  Office appointment scheduled 12/21/2016 with Dr. Irish Lack.  Copy of ICM check sent to Dr. Rayann Heman and Dr. Irish Lack.   3 month ICM trend: 12/20/2016    1 Year ICM trend:       [name], RN 12/20/2016 12:01 PM

## 2016-12-21 ENCOUNTER — Ambulatory Visit (INDEPENDENT_AMBULATORY_CARE_PROVIDER_SITE_OTHER): Payer: Medicare Other | Admitting: *Deleted

## 2016-12-21 ENCOUNTER — Encounter: Payer: Self-pay | Admitting: Interventional Cardiology

## 2016-12-21 ENCOUNTER — Ambulatory Visit: Payer: Medicare Other | Admitting: Interventional Cardiology

## 2016-12-21 VITALS — BP 110/70 | HR 73 | Ht 63.0 in | Wt 153.0 lb

## 2016-12-21 DIAGNOSIS — I10 Essential (primary) hypertension: Secondary | ICD-10-CM | POA: Diagnosis not present

## 2016-12-21 DIAGNOSIS — E782 Mixed hyperlipidemia: Secondary | ICD-10-CM | POA: Diagnosis not present

## 2016-12-21 DIAGNOSIS — I4891 Unspecified atrial fibrillation: Secondary | ICD-10-CM

## 2016-12-21 DIAGNOSIS — I6789 Other cerebrovascular disease: Secondary | ICD-10-CM

## 2016-12-21 DIAGNOSIS — I481 Persistent atrial fibrillation: Secondary | ICD-10-CM | POA: Diagnosis not present

## 2016-12-21 DIAGNOSIS — Z950 Presence of cardiac pacemaker: Secondary | ICD-10-CM

## 2016-12-21 DIAGNOSIS — I4819 Other persistent atrial fibrillation: Secondary | ICD-10-CM

## 2016-12-21 LAB — POCT INR: INR: 1.9

## 2016-12-21 NOTE — Patient Instructions (Signed)
Today take 1 tablet, then Continue taking 1/2 tablet daily except except 1 tablet on Mondays, Wednesdays, and Fridays.  Recheck INR in 2 weeks

## 2016-12-21 NOTE — Patient Instructions (Signed)

## 2017-01-03 ENCOUNTER — Other Ambulatory Visit: Payer: Self-pay | Admitting: Internal Medicine

## 2017-01-06 ENCOUNTER — Other Ambulatory Visit (HOSPITAL_BASED_OUTPATIENT_CLINIC_OR_DEPARTMENT_OTHER): Payer: Medicare Other

## 2017-01-06 DIAGNOSIS — C50919 Malignant neoplasm of unspecified site of unspecified female breast: Secondary | ICD-10-CM

## 2017-01-06 DIAGNOSIS — C50211 Malignant neoplasm of upper-inner quadrant of right female breast: Secondary | ICD-10-CM

## 2017-01-06 DIAGNOSIS — C7989 Secondary malignant neoplasm of other specified sites: Secondary | ICD-10-CM

## 2017-01-06 DIAGNOSIS — C50911 Malignant neoplasm of unspecified site of right female breast: Secondary | ICD-10-CM

## 2017-01-06 LAB — CBC WITH DIFFERENTIAL/PLATELET
BASO%: 1 % (ref 0.0–2.0)
Basophils Absolute: 0.1 10*3/uL (ref 0.0–0.1)
EOS%: 3.2 % (ref 0.0–7.0)
Eosinophils Absolute: 0.2 10*3/uL (ref 0.0–0.5)
HCT: 40.9 % (ref 34.8–46.6)
HGB: 13.6 g/dL (ref 11.6–15.9)
LYMPH%: 28.8 % (ref 14.0–49.7)
MCH: 29.5 pg (ref 25.1–34.0)
MCHC: 33.2 g/dL (ref 31.5–36.0)
MCV: 89.1 fL (ref 79.5–101.0)
MONO#: 0.6 10*3/uL (ref 0.1–0.9)
MONO%: 8.9 % (ref 0.0–14.0)
NEUT#: 4.2 10*3/uL (ref 1.5–6.5)
NEUT%: 58.1 % (ref 38.4–76.8)
Platelets: 176 10*3/uL (ref 145–400)
RBC: 4.59 10*6/uL (ref 3.70–5.45)
RDW: 14 % (ref 11.2–14.5)
WBC: 7.2 10*3/uL (ref 3.9–10.3)
lymph#: 2.1 10*3/uL (ref 0.9–3.3)

## 2017-01-06 LAB — COMPREHENSIVE METABOLIC PANEL
ALT: 15 U/L (ref 0–55)
AST: 23 U/L (ref 5–34)
Albumin: 4.5 g/dL (ref 3.5–5.0)
Alkaline Phosphatase: 34 U/L — ABNORMAL LOW (ref 40–150)
Anion Gap: 12 mEq/L — ABNORMAL HIGH (ref 3–11)
BUN: 19.9 mg/dL (ref 7.0–26.0)
CO2: 26 mEq/L (ref 22–29)
Calcium: 10.3 mg/dL (ref 8.4–10.4)
Chloride: 102 mEq/L (ref 98–109)
Creatinine: 1.1 mg/dL (ref 0.6–1.1)
EGFR: 47 mL/min/{1.73_m2} — ABNORMAL LOW (ref >60)
Glucose: 92 mg/dl (ref 70–140)
Potassium: 4.3 mEq/L (ref 3.5–5.1)
Sodium: 140 mEq/L (ref 136–145)
Total Bilirubin: 0.53 mg/dL (ref 0.20–1.20)
Total Protein: 7.9 g/dL (ref 6.4–8.3)

## 2017-01-11 NOTE — Progress Notes (Signed)
ID: Debra Lynn   DOB: Aug 02, 1928  MR#: [id number]  RJJ#:[id number]  PCP: Aretta Nip, MD GYN: SURolm Bookbinder OTHER KZ:SWFUXNA [name]  CHIEF COMPLAINT: Locally recurrent breast cancer  CURRENT TREATMENT: Anastrozole  BREAST CANCER HISTORY: From the original intake note:  The patient had routine screening mammography 12/15/2010 at [facility]. She has heterogeneously dense breast tissue. There was a suggestion of a new mass in the upper inner quadrant of the right breast and she was brought back November 16 for right diagnostic mammography and ultrasonography. Spot compression views confirmed a 1.8 cm multilobulated mass which by ultrasound measured 2.1 cm, was ill-defined and hypoechoic. Biopsies November 19 ([id number]) showed an invasive ductal carcinoma, grade 2, strongly e-cadherin positive, estrogen receptor 90% positive, progesterone receptor negative, with no HER to amplification by CISH. The MIB-1 was 25%.  The patient is not a candidate for MRI because she has a pacemaker in place. Accordingly she underwent BSGI, showing a solitary focus of increased uptake, measuring 2.3 cm. With this information she was seen at the [facility] and a recommendation tomproceed with surgery was made. She had a Right simple mastectomy 01/15/2011.   Her subsequent history is as detailed below.   INTERVAL HISTORY: Debra Lynn returns today for follow-up and treatment of her locally recurrent breast cancer accompanied by her daughter. She remains on anastrozole, with good tolerance. She denies having any issues with hot flashes or vaginal dryness.   On 03/05/2016 she completed a chest x-ray showing no active cardiopulmonary disease.  REVIEW OF SYSTEMS: Debra Lynn reports that she has some discomfort in the right chest where she had her surgery. She notes that it hurts frequently when she moves. She takes tylenol a few times a day, which seems to be helping. She notes that she sleeps well during the night, and she  may nap during the day sometimes. She reports being in the bed less than 50% of the time. She also reports that, as of right now, she stays in the house. She notes that her right arm and leg "do not work well". She notes that she will have her routine mammography at [facility] on 01/14/2017. She denies unusual headaches, visual changes, nausea, vomiting, or dizziness. There has been no unusual cough, phlegm production, or pleurisy. This been no change in bowel or bladder habits. She denies unexplained fatigue or unexplained weight loss, bleeding, rash, or fever. A detailed review of systems was otherwise stable.   PAST MEDICAL HISTORY: Past Medical History:  Diagnosis Date  . Acute ischemic stroke ([place]) 08/23/2012  . Acute, but ill-defined, cerebrovascular disease 11/08/2012  . Arthritis    . Atrial fibrillation ([place])    a. s/p AVN ablation  . Cancer [facility]) 12/2010   breast with recurrence s/p recent XRT  . Complete heart block (H[facility])    a. s/p STJ CRTP  . Compression fracture        . CVA 05/07/2008      . Fibrocystic breast   . GERD (gastroesophageal reflux disease)   . Glaucoma   . Hyperlipidemia   . Hypertension   . Orthostatic hypotension   . Overactive bladder   . Parkinsonism ([place])   . Pericarditis       . 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 Seasonal allergic rhinitis    Pos Skin Test 07-05-07  . Seizures ([place]) 2013      . Thoracic aneurysm without mention of rupture     PAST SURGICAL HISTORY: Past  Surgical History:  Procedure Laterality Date  . ABDOMINAL HYSTERECTOMY  1975   pt. unsure if laparascopic/vaginal/abdominal  . ABLATION  1980s   AVN ablation   . APPENDECTOMY    . BI-VENTRICULAR PACEMAKER UPGRADE  01/2007   3rd device, upgraded to SJM Frontier II BiV pacemaker by Dr Leonia Reeves  . BREAST SURGERY Right 01/15/11   mastectomy  . CATARACT EXTRACTION Bilateral   . CHOLECYSTECTOMY    . EP IMPLANTABLE DEVICE N/A 06/27/2014   CRT-P  (SJM) gen change  by Dr Rayann Heman  . EYE SURGERY     For glaucoma  . FLEXIBLE BRONCHOSCOPY W/ UPPER ENDOSCOPY     and swallowing evaluation  . KNEE ARTHROSCOPY Right    Arthroscopic for torn meniscus  . MASS EXCISION Right 01/19/2013   Procedure: EXCISION CHEST WALL MASS;  Surgeon: [name], MD;  Location: [facility];  Service: General;  Laterality: Right;  . PACEMAKER INSERTION     1992; gen change 2004  . VESICOVAGINAL FISTULA CLOSURE W/ TAH      FAMILY HISTORY Family History  Problem Relation Age of Onset  . Heart failure Mother   . Cancer Father        brain  . Asthma Brother   . Asthma Daughter   . Heart disease Unknown   . Cancer Unknown   . Lymphoma Brother   . Diabetes Brother   . Hypertension Brother   . Heart attack Brother   The patient's father died at the age of 4 from central nervous system cancer. The patient's mother died at the age of 19. The patient had one brother who had a history of lymphoma, but survives.  GYNECOLOGIC HISTORY: GX P2. 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 Her husband [name] used to work for [employer]. Daughter Debra Lynn lives in [place] and is a retired Merchant navy officer. Debra Lynn is a widow of my former patient "[name]" [name] Daughter Debra Lynn lives in [place] where she works as a Orthoptist. The patient has 2 grandsons. She is not a Ambulance person.    ADVANCED DIRECTIVES: in place  HEALTH MAINTENANCE: Social History   Tobacco Use  . Smoking status: Never Smoker  . Smokeless tobacco: Never Used  Substance Use Topics  . Alcohol use: No  . Drug use: No     Colonoscopy:  PAP:  Bone density: SOLIS Jan 2013, T - 1.7 R fem neck  Lipid panel:  No Known Allergies  Current Outpatient Medications  Medication Sig Dispense Refill  . acetaminophen (TYLENOL) 500  MG tablet Take 1,000 mg by mouth every 6 (six) hours as needed for moderate pain (pain).     Marland Kitchen anastrozole (ARIMIDEX) 1 MG tablet Take 1 tablet (1 mg total) by mouth daily. 90 tablet 3  . benzonatate (TESSALON) 100 MG capsule TAKE 1 CAPSULE BY MOUTH 3 TIMES A DAY AS NEEDED FOR COUGH 90 capsule 3  . Calcium Carbonate-Vitamin D (CALCIUM-VITAMIN D) 500-200 MG-UNIT per tablet Take 1 tablet by mouth daily.     . cephALEXin (KEFLEX) 250 MG capsule Take 250 mg by mouth at bedtime.    . colestipol (COLESTID) 1 G tablet Take 1 g by mouth at bedtime.    . furosemide (LASIX) 40 MG tablet TAKE 1 TABLET BY MOUTH  EVERY MORNING 90 tablet 3  .  levETIRAcetam (KEPPRA) 250 MG tablet Take 250 mg by mouth 2 (two) times daily.    . Multiple Vitamin (MULTIVITAMIN WITH MINERALS) TABS tablet Take 1 tablet by mouth daily.    . pantoprazole (PROTONIX) 40 MG tablet Take 1 tablet by mouth daily.    . potassium chloride SA (K-DUR,KLOR-CON) 20 MEQ tablet TAKE 1 TABLET BY MOUTH  DAILY 90 tablet 3  . pravastatin (PRAVACHOL) 80 MG tablet TAKE 1 TABLET BY MOUTH  DAILY 90 tablet 3  . Probiotic Product (ALIGN) 4 MG CAPS Take 4 mg by mouth daily. 30 capsule 0  . propranolol (INDERAL) 60 MG tablet TAKE ONE-HALF TABLET BY  MOUTH TWICE A DAY 90 tablet 3  . warfarin (COUMADIN) 2.5 MG tablet as directed.      No current facility-administered medications for this visit.     OBJECTIVE: Elderly white woman who appears stated age  54:   01/13/17 0923  BP: 130/70  Pulse: 65  Resp: 18  Temp: (!) 97.4 F (36.3 C)  SpO2: 98%     Body mass index is 26.98 kg/m.    ECOG FS: 2  Sclerae unicteric, EOMs intact No cervical or supraclavicular adenopathy Lungs no rales or rhonchi Heart regular rate and rhythm Abd soft, nontender, positive bowel sounds MSK no focal spinal tenderness, no right chest wall tenderness to palpation; straight leg raising on the left to 25 degrees, on the right to 15 degrees Neuro: nonfocal, pleasant  affect Breasts: The right breast is status post mastectomy.  There is no evidence of chest wall recurrence.  The left breast is unremarkable.  Both axillae are benign   LAB RESULTS: Lab Results  Component Value Date   WBC 7.2 01/06/2017   NEUTROABS 4.2 01/06/2017   HGB 13.6 01/06/2017   HCT 40.9 01/06/2017   MCV 89.1 01/06/2017   PLT 176 01/06/2017      Chemistry      Component Value Date/Time   NA 140 01/06/2017 1307   K 4.3 01/06/2017 1307   CL 104 03/14/2016 0241   CL 104 03/23/2012 1329   CO2 26 01/06/2017 1307   BUN 19.9 01/06/2017 1307   CREATININE 1.1 01/06/2017 1307   GLU 102 03/18/2016      Component Value Date/Time   CALCIUM 10.3 01/06/2017 1307   ALKPHOS 34 (L) 01/06/2017 1307   AST 23 01/06/2017 1307   ALT 15 01/06/2017 1307   BILITOT 0.53 01/06/2017 1307       Lab Results  Component Value Date   LABCA2 15 12/30/2010    No components found for: [id number]  No results for input(s): INR in the last 168 hours.  Urinalysis    Component Value Date/Time   COLORURINE YELLOW 03/11/2016 2126   APPEARANCEUR CLOUDY (A) 03/11/2016 2126   LABSPEC 1.021 03/11/2016 2126   PHURINE 5.0 03/11/2016 2126   GLUCOSEU NEGATIVE 03/11/2016 2126   HGBUR NEGATIVE 03/11/2016 2126   BILIRUBINUR NEGATIVE 03/11/2016 2126   KETONESUR NEGATIVE 03/11/2016 2126   PROTEINUR 100 (A) 03/11/2016 2126   UROBILINOGEN 0.2 12/14/2013 1608   NITRITE NEGATIVE 03/11/2016 2126   LEUKOCYTESUR LARGE (A) 03/11/2016 2126    STUDIES: On 03/05/2016 she completed a chest x-ray showing no active cardiopulmonary disease.  ASSESSMENT: 83 y.o. [place] woman status post simple mastectomy 01/15/2011 for a Right upper inner quadrant T2 NX (stage II) invasive ductal carcinoma, grade 3, 100% estrogen receptor positive, with a borderline MIB-1, progesterone receptor and HER-2 negative.  (1) not a candidate for  tamoxifen due to cardiac issues and decided against aromatase inhibitors because of  osteopenia present at baseline and concerns re. bisphosphonates  (2) local recurrence to the right chest wall documented by biopsy 01-2013 showing an invasive ductal carcinoma, grade 1 or 2, estrogen receptor strongly positive, progesterone receptor and HER-2 negative, with an MIB-1 of 28%.  (3) excision of the right chest wall mass 01/19/2013 with negative but close margins (the deep margin is skeletal muscle).  (4) status post radiation therapy to the right chest wall completed 04/23/2013  (5) Ct chest January 2015 shows small bilateral lung lesions many of which are longstanding; repeat chest CT November 2015 showed no change; CXR 10/15/2015 negative, repeat chest x-ray February 2018 also unremarkable  (6) anastrozole started January 2015;   (a) osteopenia per bone density January 2013 with T -1.5 as lowest score  (b) repeat bone density at [facility] 02/11/2015 shows a T score of -2.7   PLAN: Rubena is now 4 years out from her recurrence.  There is no evidence of further disease activity.  This is very favorable.  She was supposed to have had her mammogram before today's visit but it had to be postponed because of the weather.  It is scheduled for tomorrow.  She is due for repeat bone density early next year.  That order has been placed.  The plan is for her to continue anastrozole for another year.  She is going to see me January 2020 at which time she will be ready to "graduate" from follow-up  They know to call for any other issues that may develop before her next visit.  Magrinat, Virgie Dad, MD  01/13/17 9:55 AM Medical Oncology and Hematology [facility] [address] [place], [place] [address] Tel. [phone]    Fax. [phone]  This document serves as a record of services personally performed by Lurline Del, MD. It was created on his behalf by Sheron Nightingale, a trained medical scribe. The creation of this record is based on the scribe's personal  observations and the provider's statements to them.   I have reviewed the above documentation for accuracy and completeness, and I agree with the above.

## 2017-01-13 ENCOUNTER — Telehealth: Payer: Self-pay | Admitting: Oncology

## 2017-01-13 ENCOUNTER — Ambulatory Visit (HOSPITAL_BASED_OUTPATIENT_CLINIC_OR_DEPARTMENT_OTHER): Payer: Medicare Other | Admitting: Oncology

## 2017-01-13 VITALS — BP 130/70 | HR 65 | Temp 97.4°F | Resp 18 | Ht 63.0 in | Wt 152.3 lb

## 2017-01-13 DIAGNOSIS — Z79811 Long term (current) use of aromatase inhibitors: Secondary | ICD-10-CM | POA: Diagnosis not present

## 2017-01-13 DIAGNOSIS — Z853 Personal history of malignant neoplasm of breast: Secondary | ICD-10-CM

## 2017-01-13 DIAGNOSIS — C50211 Malignant neoplasm of upper-inner quadrant of right female breast: Secondary | ICD-10-CM

## 2017-01-13 DIAGNOSIS — C50911 Malignant neoplasm of unspecified site of right female breast: Secondary | ICD-10-CM

## 2017-01-13 DIAGNOSIS — C7989 Secondary malignant neoplasm of other specified sites: Secondary | ICD-10-CM

## 2017-01-13 MED ORDER — ANASTROZOLE 1 MG PO TABS: 1.0000 mg | ORAL_TABLET | Freq: Every day | ORAL | 3 refills | Status: DC

## 2017-01-13 NOTE — Telephone Encounter (Signed)
Gave patients daughter calendar of upcoming January 2020 appointments

## 2017-01-14 ENCOUNTER — Ambulatory Visit (INDEPENDENT_AMBULATORY_CARE_PROVIDER_SITE_OTHER): Payer: Medicare Other | Admitting: *Deleted

## 2017-01-14 DIAGNOSIS — I6789 Other cerebrovascular disease: Secondary | ICD-10-CM | POA: Diagnosis not present

## 2017-01-14 DIAGNOSIS — I4819 Other persistent atrial fibrillation: Secondary | ICD-10-CM

## 2017-01-14 DIAGNOSIS — I481 Persistent atrial fibrillation: Secondary | ICD-10-CM

## 2017-01-14 DIAGNOSIS — I4891 Unspecified atrial fibrillation: Secondary | ICD-10-CM | POA: Diagnosis not present

## 2017-01-14 DIAGNOSIS — Z5181 Encounter for therapeutic drug level monitoring: Secondary | ICD-10-CM

## 2017-01-14 LAB — POCT INR: INR: 2

## 2017-01-14 MED ORDER — WARFARIN SODIUM 2.5 MG PO TABS: 2.5000 mg | ORAL_TABLET | ORAL | 1 refills | Status: DC

## 2017-01-14 NOTE — Patient Instructions (Signed)
Description   Continue taking 1/2 tablet daily except except 1 tablet on Mondays, Wednesdays, and Fridays.  Recheck INR in 4 weeks

## 2017-01-20 ENCOUNTER — Ambulatory Visit (INDEPENDENT_AMBULATORY_CARE_PROVIDER_SITE_OTHER): Payer: Medicare Other

## 2017-01-20 DIAGNOSIS — I5022 Chronic systolic (congestive) heart failure: Secondary | ICD-10-CM

## 2017-01-20 DIAGNOSIS — Z950 Presence of cardiac pacemaker: Secondary | ICD-10-CM

## 2017-01-21 NOTE — Progress Notes (Signed)
EPIC Encounter for ICM Monitoring  Patient Name: Debra Lynn is a 83 y.o. female Date: 01/21/2017 Primary Care Physican: Aretta Nip, MD Primary [place] Electrophysiologist: Allred Dry Weight:does not weigh Bi-V Pacing: >99%  AT/AF Burden 4.6%      Transmission received.   Thoracic impedance normal.  Prescribed dosage: Furosemide 40 mg 1 tablet daily and Potassium 20 mEq 1 tablet daily.  Labs: 03/18/2016 Creatinine 0.8, BUN 18, Potassium 3.9, Sodium 141 03/14/2016 Creatinine 0.74, BUN 8,  Potassium 3.5, Sodium 137, EGFR >60 03/12/2016 Creatinine 0.88, BUN 14, Potassium 3.4, Sodium 138, EGFR 58 - >60 03/11/2016 Creatinine 0.95, BUN 17, Potassium 3.7, Sodium 138, EGFR 53 - >60 01/06/2016 Creatinine 0.9, BUN 19.4, Potassium 4.4, Sodium 140, EGFR 57 10/01/2015 Creatinine 0.95, BUN 19, Potassium 4.4, Sodium 139 12/05/2016Creatinine 1.0, BUN 19, Potassium 3.8, Sodium 142  Recommendations: None.  Follow-up plan: ICM clinic phone appointment on 02/21/2017.    Copy of ICM check sent to Dr. Rayann Heman.   3 month ICM trend: 01/20/2017    1 Year ICM trend:       [name], RN 01/21/2017 4:47 PM

## 2017-02-03 ENCOUNTER — Other Ambulatory Visit: Payer: Self-pay | Admitting: *Deleted

## 2017-02-03 MED ORDER — WARFARIN SODIUM 2.5 MG PO TABS: 2.5000 mg | ORAL_TABLET | ORAL | 1 refills | Status: DC

## 2017-02-03 NOTE — Telephone Encounter (Signed)
Dtr called requesting refill for the pt to be sent to [facility] & not [facility]. Rx Sent per request.

## 2017-02-06 ENCOUNTER — Other Ambulatory Visit: Payer: Self-pay | Admitting: Interventional Cardiology

## 2017-02-11 ENCOUNTER — Ambulatory Visit (INDEPENDENT_AMBULATORY_CARE_PROVIDER_SITE_OTHER): Payer: Medicare Other | Admitting: *Deleted

## 2017-02-11 DIAGNOSIS — I6789 Other cerebrovascular disease: Secondary | ICD-10-CM

## 2017-02-11 DIAGNOSIS — I481 Persistent atrial fibrillation: Secondary | ICD-10-CM

## 2017-02-11 DIAGNOSIS — I4819 Other persistent atrial fibrillation: Secondary | ICD-10-CM

## 2017-02-11 DIAGNOSIS — I4891 Unspecified atrial fibrillation: Secondary | ICD-10-CM

## 2017-02-11 DIAGNOSIS — Z5181 Encounter for therapeutic drug level monitoring: Secondary | ICD-10-CM

## 2017-02-11 LAB — POCT INR: INR: 1.9

## 2017-02-11 NOTE — Patient Instructions (Signed)
Description   Today take 1.5 tablets then start taking 1 tablet daily except 1/2 tablet on Tuesday, Thursday, and Saturdays.  Recheck INR in 3 weeks

## 2017-02-17 ENCOUNTER — Encounter: Payer: Self-pay | Admitting: Oncology

## 2017-02-21 ENCOUNTER — Ambulatory Visit (INDEPENDENT_AMBULATORY_CARE_PROVIDER_SITE_OTHER): Payer: Medicare Other | Admitting: *Deleted

## 2017-02-21 DIAGNOSIS — I5022 Chronic systolic (congestive) heart failure: Secondary | ICD-10-CM

## 2017-02-21 DIAGNOSIS — Z950 Presence of cardiac pacemaker: Secondary | ICD-10-CM | POA: Diagnosis not present

## 2017-02-21 DIAGNOSIS — I442 Atrioventricular block, complete: Secondary | ICD-10-CM | POA: Diagnosis not present

## 2017-02-21 NOTE — Progress Notes (Signed)
Remote pacemaker transmission.   

## 2017-02-21 NOTE — Progress Notes (Signed)
EPIC Encounter for ICM Monitoring  Patient Name: Debra Lynn is a 84 y.o. female Date: 02/21/2017 Primary Care Physican: Aretta Nip, MD Primary [place] Electrophysiologist: Allred Dry Weight:does not weigh Bi-V Pacing: >99%  AT/AF Burden 3.4%         Spoketodaughter [name].Heart Failure questions reviewed, pt asymptomatic.   Thoracic impedance normal.   Per Merlin rep, patient has high impedance leads so her baseline (reference line) normal is is 1600 oms.  The Corvue readings will be evaluated from the perspective of 1600 oms baseline is the norm for her.   Prescribed dosage: Furosemide 40 mg 1 tablet daily and Potassium 20 mEq 1 tablet daily.  Labs: 03/18/2016 Creatinine 0.8, BUN 18, Potassium 3.9, Sodium 141 03/14/2016 Creatinine 0.74, BUN 8,  Potassium 3.5, Sodium 137, EGFR >60 03/12/2016 Creatinine 0.88, BUN 14, Potassium 3.4, Sodium 138, EGFR 58 - >60 03/11/2016 Creatinine 0.95, BUN 17, Potassium 3.7, Sodium 138, EGFR 53 - >60 01/06/2016 Creatinine 0.9, BUN 19.4, Potassium 4.4, Sodium 140, EGFR 57 10/01/2015 Creatinine 0.95, BUN 19, Potassium 4.4, Sodium 139 12/05/2016Creatinine 1.0, BUN 19, Potassium 3.8, Sodium 142  Recommendations: No changes.    Follow-up plan: ICM clinic phone appointment on 03/24/2017.    Copy of ICM check sent to Dr. Rayann Heman.   3 month ICM trend: 02/21/2017    1 Year ICM trend:       [name], RN 02/21/2017 3:22 PM

## 2017-02-22 ENCOUNTER — Encounter: Payer: Self-pay | Admitting: Cardiology

## 2017-02-22 NOTE — Progress Notes (Signed)
Letter  

## 2017-03-04 ENCOUNTER — Ambulatory Visit: Payer: Medicare Other | Admitting: Pharmacist

## 2017-03-04 DIAGNOSIS — I481 Persistent atrial fibrillation: Secondary | ICD-10-CM

## 2017-03-04 DIAGNOSIS — I4891 Unspecified atrial fibrillation: Secondary | ICD-10-CM

## 2017-03-04 DIAGNOSIS — I4819 Other persistent atrial fibrillation: Secondary | ICD-10-CM

## 2017-03-04 DIAGNOSIS — I6789 Other cerebrovascular disease: Secondary | ICD-10-CM

## 2017-03-04 LAB — POCT INR: INR: 2.4

## 2017-03-04 NOTE — Patient Instructions (Signed)
Description   Continue 1 tablet daily except 1/2 tablet on Tuesday, Thursday, and Saturdays.  Recheck INR in 4 weeks

## 2017-03-19 LAB — CUP PACEART REMOTE DEVICE CHECK
Battery Remaining Longevity: 82 mo
Battery Remaining Percentage: 95.5 %
Battery Voltage: 2.98 V
Brady Statistic AP VP Percent: 25 %
Brady Statistic AP VS Percent: 1 %
Brady Statistic AS VP Percent: 75 %
Brady Statistic AS VS Percent: 1 %
Brady Statistic RA Percent Paced: 23 %
Date Time Interrogation Session: 20190121121610
Implantable Lead Implant Date: 20081223
Implantable Lead Implant Date: 20081223
Implantable Lead Implant Date: 20081223
Implantable Lead Location: 753858
Implantable Lead Location: 753859
Implantable Lead Location: 753860
Implantable Lead Model: 5024
Implantable Lead Model: 5524
Implantable Pulse Generator Implant Date: 20160526
Lead Channel Impedance Value: 1075 Ohm
Lead Channel Impedance Value: 1175 Ohm
Lead Channel Impedance Value: 560 Ohm
Lead Channel Pacing Threshold Amplitude: 1.5 V
Lead Channel Pacing Threshold Amplitude: 2 V
Lead Channel Pacing Threshold Amplitude: 2.25 V
Lead Channel Pacing Threshold Pulse Width: 0.5 ms
Lead Channel Pacing Threshold Pulse Width: 0.7 ms
Lead Channel Pacing Threshold Pulse Width: 0.8 ms
Lead Channel Sensing Intrinsic Amplitude: 3.5 mV
Lead Channel Sensing Intrinsic Amplitude: 8.5 mV
Lead Channel Setting Pacing Amplitude: 2.5 V
Lead Channel Setting Pacing Amplitude: 3.25 V
Lead Channel Setting Pacing Amplitude: 3.5 V
Lead Channel Setting Pacing Pulse Width: 0.7 ms
Lead Channel Setting Pacing Pulse Width: 0.8 ms
Lead Channel Setting Sensing Sensitivity: 4.5 mV
Pulse Gen Model: 3222
Pulse Gen Serial Number: 7738731

## 2017-03-24 ENCOUNTER — Ambulatory Visit (INDEPENDENT_AMBULATORY_CARE_PROVIDER_SITE_OTHER): Payer: Medicare Other

## 2017-03-24 DIAGNOSIS — Z950 Presence of cardiac pacemaker: Secondary | ICD-10-CM

## 2017-03-24 DIAGNOSIS — I5022 Chronic systolic (congestive) heart failure: Secondary | ICD-10-CM | POA: Diagnosis not present

## 2017-03-25 NOTE — Progress Notes (Signed)
EPIC Encounter for ICM Monitoring  Patient Name: Debra Lynn is a 84 y.o. female Date: 03/25/2017 Primary Care Physican: Aretta Nip, MD Primary [place] Electrophysiologist: Allred Dry Weight:does not weigh Bi-V Pacing: >99%  AT/AF Burden 2.7%                                        Spoketodaughter [name].Heart Failure questions reviewed, pt asymptomatic.   Thoracic impedance normal. Per Merlin rep, patient has high impedance leads so her baseline (reference line) normal is is 1600 oms. The Corvue readings will be evaluated from the perspective of 1600 oms baseline is the norm for her.   Prescribed dosage: Furosemide 40 mg 1 tablet daily and Potassium 20 mEq 1 tablet daily.  Labs: 03/18/2016 Creatinine 0.8, BUN 18, Potassium 3.9, Sodium 141 03/14/2016 Creatinine 0.74, BUN 8,  Potassium 3.5, Sodium 137, EGFR >60 03/12/2016 Creatinine 0.88, BUN 14, Potassium 3.4, Sodium 138, EGFR 58 - >60 03/11/2016 Creatinine 0.95, BUN 17, Potassium 3.7, Sodium 138, EGFR 53 - >60 01/06/2016 Creatinine 0.9, BUN 19.4, Potassium 4.4, Sodium 140, EGFR 57 10/01/2015 Creatinine 0.95, BUN 19, Potassium 4.4, Sodium 139  Recommendations: No changes.   Encouraged to call for fluid symptoms.  Follow-up plan: ICM clinic phone appointment on 04/25/2017.    Copy of ICM check sent to Dr. Rayann Heman.   3 month ICM trend: 03/24/2017    1 Year ICM trend:       [name], RN 03/25/2017 2:06 PM

## 2017-04-04 ENCOUNTER — Ambulatory Visit (INDEPENDENT_AMBULATORY_CARE_PROVIDER_SITE_OTHER): Payer: Medicare Other | Admitting: *Deleted

## 2017-04-04 DIAGNOSIS — I6789 Other cerebrovascular disease: Secondary | ICD-10-CM

## 2017-04-04 DIAGNOSIS — I4819 Other persistent atrial fibrillation: Secondary | ICD-10-CM

## 2017-04-04 DIAGNOSIS — I4891 Unspecified atrial fibrillation: Secondary | ICD-10-CM | POA: Diagnosis not present

## 2017-04-04 DIAGNOSIS — I481 Persistent atrial fibrillation: Secondary | ICD-10-CM

## 2017-04-04 DIAGNOSIS — Z5181 Encounter for therapeutic drug level monitoring: Secondary | ICD-10-CM

## 2017-04-04 LAB — POCT INR: INR: 2.7

## 2017-04-04 NOTE — Patient Instructions (Signed)
Description   Continue 1 tablet daily except 1/2 tablet on Tuesday, Thursday, and Saturdays.  Recheck INR in 4 weeks

## 2017-04-25 ENCOUNTER — Ambulatory Visit (INDEPENDENT_AMBULATORY_CARE_PROVIDER_SITE_OTHER): Payer: Medicare Other

## 2017-04-25 DIAGNOSIS — Z950 Presence of cardiac pacemaker: Secondary | ICD-10-CM

## 2017-04-25 DIAGNOSIS — I5022 Chronic systolic (congestive) heart failure: Secondary | ICD-10-CM

## 2017-04-25 NOTE — Progress Notes (Signed)
EPIC Encounter for ICM Monitoring  Patient Name: Debra Lynn is a 84 y.o. female Date: 04/25/2017 Primary Care Physican: Aretta Nip, MD Primary [place] Electrophysiologist: Allred Dry Weight:does not weigh Bi-V Pacing: >99%  AT/AF Burden2.3%  Spoketodaughter [name].   Heart Failure questions reviewed, pt asymptomatic.   Thoracic impedance normal. Per Merlin rep, patient has high impedance leads so her baseline (reference line) normal is is 1600 oms. The Corvue readings will be evaluated from the perspective of 1600 oms baseline is the norm for her.   Prescribed dosage: Furosemide 40 mg 1 tablet daily and Potassium 20 mEq 1 tablet daily.  Labs: 03/18/2016 Creatinine 0.8, BUN 18, Potassium 3.9, Sodium 141 03/14/2016 Creatinine 0.74, BUN 8,  Potassium 3.5, Sodium 137, EGFR >60 03/12/2016 Creatinine 0.88, BUN 14, Potassium 3.4, Sodium 138, EGFR 58 - >60 03/11/2016 Creatinine 0.95, BUN 17, Potassium 3.7, Sodium 138, EGFR 53 - >60 01/06/2016 Creatinine 0.9, BUN 19.4, Potassium 4.4, Sodium 140, EGFR 57 10/01/2015 Creatinine 0.95, BUN 19, Potassium 4.4, Sodium 139   Recommendations: No changes.  Encouraged to call for fluid symptoms.  Follow-up plan: ICM clinic phone appointment on 05/26/2017.   Copy of ICM check sent to Dr. Rayann Heman.   3 month ICM trend: 04/25/2017    1 Year ICM trend:       [name], RN 04/25/2017 9:03 AM

## 2017-05-02 ENCOUNTER — Ambulatory Visit (INDEPENDENT_AMBULATORY_CARE_PROVIDER_SITE_OTHER): Payer: Medicare Other | Admitting: Pharmacist

## 2017-05-02 DIAGNOSIS — I6789 Other cerebrovascular disease: Secondary | ICD-10-CM

## 2017-05-02 DIAGNOSIS — I4891 Unspecified atrial fibrillation: Secondary | ICD-10-CM | POA: Diagnosis not present

## 2017-05-02 LAB — POCT INR: INR: 2.2

## 2017-05-02 NOTE — Patient Instructions (Signed)
Description   Continue 1 tablet daily except 1/2 tablet on Tuesday, Thursday, and Saturdays.  Recheck INR in 6 weeks

## 2017-05-26 ENCOUNTER — Ambulatory Visit (INDEPENDENT_AMBULATORY_CARE_PROVIDER_SITE_OTHER): Payer: Medicare Other | Admitting: *Deleted

## 2017-05-26 DIAGNOSIS — Z950 Presence of cardiac pacemaker: Secondary | ICD-10-CM | POA: Diagnosis not present

## 2017-05-26 DIAGNOSIS — I5022 Chronic systolic (congestive) heart failure: Secondary | ICD-10-CM

## 2017-05-26 DIAGNOSIS — I442 Atrioventricular block, complete: Secondary | ICD-10-CM

## 2017-05-26 NOTE — Progress Notes (Signed)
EPIC Encounter for ICM Monitoring  Patient Name: Debra Lynn is a 84 y.o. female Date: 05/26/2017 Primary Care Physican: Aretta Nip, MD Primary [place] Electrophysiologist: Allred Dry Weight:does not weigh Bi-V Pacing: >99%  AT/AF Burden 1.9%  Spoketodaughter [name].  Heart Failure questions reviewed, pt asymptomatic.   Thoracic impedance normal but was abnormal suggesting fluid accumulation from 05/07/2017 - 05/19/2017.  Per Merlin rep, patient has high impedance leads so her baseline (reference line) normal is is 1600 oms. The Corvue readings will be evaluated from the perspective of >1600 oms baseline is the norm for her.  Prescribed dosage: Furosemide 40 mg 1 tablet daily and Potassium 20 mEq 1 tablet daily.  Labs: 03/18/2016 Creatinine 0.8, BUN 18, Potassium 3.9, Sodium 141 03/14/2016 Creatinine 0.74, BUN 8,  Potassium 3.5, Sodium 137, EGFR >60 03/12/2016 Creatinine 0.88, BUN 14, Potassium 3.4, Sodium 138, EGFR 58 - >60 03/11/2016 Creatinine 0.95, BUN 17, Potassium 3.7, Sodium 138, EGFR 53 - >60 01/06/2016 Creatinine 0.9, BUN 19.4, Potassium 4.4, Sodium 140, EGFR 57 10/01/2015 Creatinine 0.95, BUN 19, Potassium 4.4, Sodium 139  Recommendations: No changes.   Encouraged to call for fluid symptoms.  Follow-up plan: ICM clinic phone appointment on 06/28/2017.    Copy of ICM check sent to Dr. Rayann Heman.   3 month ICM trend: 05/26/2017    1 Year ICM trend:       [name], RN 05/26/2017 11:40 AM

## 2017-05-27 ENCOUNTER — Encounter: Payer: Self-pay | Admitting: Cardiology

## 2017-05-27 NOTE — Progress Notes (Signed)
Remote pacemaker transmission.   

## 2017-06-13 ENCOUNTER — Ambulatory Visit: Payer: Medicare Other | Admitting: Pharmacist

## 2017-06-13 DIAGNOSIS — I6789 Other cerebrovascular disease: Secondary | ICD-10-CM | POA: Diagnosis not present

## 2017-06-13 DIAGNOSIS — I4891 Unspecified atrial fibrillation: Secondary | ICD-10-CM | POA: Diagnosis not present

## 2017-06-13 LAB — POCT INR: INR: 2.5

## 2017-06-13 NOTE — Patient Instructions (Signed)
Description   Continue 1 tablet daily except 1/2 tablet on Tuesday, Thursday, and Saturdays.  Recheck INR in 6 weeks

## 2017-06-17 LAB — CUP PACEART REMOTE DEVICE CHECK
Battery Remaining Longevity: 65 mo
Battery Remaining Percentage: 95.5 %
Battery Voltage: 2.96 V
Brady Statistic AP VP Percent: 23 %
Brady Statistic AP VS Percent: 1 %
Brady Statistic AS VP Percent: 77 %
Brady Statistic AS VS Percent: 1 %
Brady Statistic RA Percent Paced: 22 %
Date Time Interrogation Session: 20190425060015
Implantable Lead Implant Date: 20081223
Implantable Lead Implant Date: 20081223
Implantable Lead Implant Date: 20081223
Implantable Lead Location: 753858
Implantable Lead Location: 753859
Implantable Lead Location: 753860
Implantable Lead Model: 5024
Implantable Lead Model: 5524
Implantable Pulse Generator Implant Date: 20160526
Lead Channel Impedance Value: 1075 Ohm
Lead Channel Impedance Value: 1125 Ohm
Lead Channel Impedance Value: 560 Ohm
Lead Channel Pacing Threshold Amplitude: 1.5 V
Lead Channel Pacing Threshold Amplitude: 1.5 V
Lead Channel Pacing Threshold Amplitude: 2.375 V
Lead Channel Pacing Threshold Pulse Width: 0.5 ms
Lead Channel Pacing Threshold Pulse Width: 0.7 ms
Lead Channel Pacing Threshold Pulse Width: 0.8 ms
Lead Channel Sensing Intrinsic Amplitude: 3.2 mV
Lead Channel Sensing Intrinsic Amplitude: 8.5 mV
Lead Channel Setting Pacing Amplitude: 2.5 V
Lead Channel Setting Pacing Amplitude: 2.5 V
Lead Channel Setting Pacing Amplitude: 3.375
Lead Channel Setting Pacing Pulse Width: 0.7 ms
Lead Channel Setting Pacing Pulse Width: 0.8 ms
Lead Channel Setting Sensing Sensitivity: 4.5 mV
Pulse Gen Model: 3222
Pulse Gen Serial Number: 7738731

## 2017-06-23 ENCOUNTER — Other Ambulatory Visit: Payer: Self-pay | Admitting: Internal Medicine

## 2017-06-28 ENCOUNTER — Other Ambulatory Visit: Payer: Self-pay | Admitting: *Deleted

## 2017-06-28 MED ORDER — WARFARIN SODIUM 2.5 MG PO TABS: ORAL_TABLET | ORAL | 0 refills | Status: DC

## 2017-06-29 ENCOUNTER — Other Ambulatory Visit: Payer: Self-pay | Admitting: Internal Medicine

## 2017-06-30 ENCOUNTER — Telehealth: Payer: Self-pay | Admitting: Internal Medicine

## 2017-06-30 ENCOUNTER — Other Ambulatory Visit: Payer: Self-pay | Admitting: Internal Medicine

## 2017-06-30 NOTE — Telephone Encounter (Signed)
Pt has not been seen at office since 10/22/13.  Med is unable to be refilled by [place] until pt schedules a follow up visit.  Attempted to call pt's daughter [name] to state this information to her but unable to reach her.  Left a message for [name] to return call x1.

## 2017-06-30 NOTE — Telephone Encounter (Signed)
Pt's daughter [name] returned call.  I stated to her we were unable to refill any meds due to not seeing her since 2017. Stated to [name] she could try to see if PCP would fill med for her.  [name] stated to me PCP stated they would not fill the tessalon since they had not seen her for needing that med filled but stated she would call PCP to schedule a visit with them due to her being able to get pt an appt at PCP sooner than with [facility].  Nothing further needed at this time.

## 2017-07-07 ENCOUNTER — Ambulatory Visit (INDEPENDENT_AMBULATORY_CARE_PROVIDER_SITE_OTHER): Payer: Medicare Other

## 2017-07-07 DIAGNOSIS — I5022 Chronic systolic (congestive) heart failure: Secondary | ICD-10-CM

## 2017-07-07 DIAGNOSIS — Z950 Presence of cardiac pacemaker: Secondary | ICD-10-CM

## 2017-07-08 NOTE — Progress Notes (Signed)
EPIC Encounter for ICM Monitoring  Patient Name: Debra Lynn is a 84 y.o. female Date: 07/08/2017 Primary Care Physican: Aretta Nip, MD Primary [place] Electrophysiologist: Allred Dry Weight:does not weigh Bi-V Pacing: >99%  AT/AF Burden 1.6%      Spoketodaughter [name].  Heart Failure questions reviewed, pt asymptomatic.  Patient out of town with daughter for a wedding and so far is doing well.   Thoracic impedance normal.  Per Merlin rep, patient has high impedance leads so her baseline (reference line) normal is is 1600 oms. The Corvue readings will be evaluated from the perspective of >1600 oms baseline is the norm for her.  Prescribed dosage: Furosemide 40 mg 1 tablet daily and Potassium 20 mEq 1 tablet daily.  Labs: 03/18/2016 Creatinine 0.8, BUN 18,Potassium 3.9, Sodium 141 03/14/2016 Creatinine 0.74, BUN 8, Potassium 3.5, Sodium 137, EGFR >60 03/12/2016 Creatinine 0.88, BUN 14, Potassium 3.4, Sodium 138, EGFR 58 - >60 03/11/2016 Creatinine 0.95, BUN 17, Potassium 3.7, Sodium 138, EGFR 53 - >60  Recommendations: No changes.   Encouraged to call for fluid symptoms.  Follow-up plan: ICM clinic phone appointment on 08/25/2017.  Office appointment scheduled 10/28/2017 with Dr. Rayann Heman. Recall appt 12/21/2017 with Dr. Irish Lack.  Copy of ICM check sent to Dr. Rayann Heman.   1 Year ICM trend:       [name], RN 07/08/2017 9:04 AM

## 2017-07-28 ENCOUNTER — Ambulatory Visit: Payer: Medicare Other | Admitting: *Deleted

## 2017-07-28 DIAGNOSIS — Z5181 Encounter for therapeutic drug level monitoring: Secondary | ICD-10-CM | POA: Diagnosis not present

## 2017-07-28 DIAGNOSIS — Z8673 Personal history of transient ischemic attack (TIA), and cerebral infarction without residual deficits: Secondary | ICD-10-CM | POA: Diagnosis not present

## 2017-07-28 DIAGNOSIS — I4891 Unspecified atrial fibrillation: Secondary | ICD-10-CM

## 2017-07-28 LAB — POCT INR: INR: 3 (ref 2.0–3.0)

## 2017-07-28 NOTE — Patient Instructions (Signed)
Description   Continue 1 tablet daily except 1/2 tablet on Tuesday, Thursday, and Saturdays.  Recheck INR in 6 weeks Do good serving of greens today

## 2017-08-11 ENCOUNTER — Other Ambulatory Visit: Payer: Self-pay | Admitting: Internal Medicine

## 2017-08-25 ENCOUNTER — Ambulatory Visit (INDEPENDENT_AMBULATORY_CARE_PROVIDER_SITE_OTHER): Payer: Medicare Other | Admitting: *Deleted

## 2017-08-25 ENCOUNTER — Ambulatory Visit (INDEPENDENT_AMBULATORY_CARE_PROVIDER_SITE_OTHER): Payer: Medicare Other

## 2017-08-25 ENCOUNTER — Telehealth: Payer: Self-pay | Admitting: Cardiology

## 2017-08-25 DIAGNOSIS — I442 Atrioventricular block, complete: Secondary | ICD-10-CM

## 2017-08-25 DIAGNOSIS — Z45018 Encounter for adjustment and management of other part of cardiac pacemaker: Secondary | ICD-10-CM | POA: Diagnosis not present

## 2017-08-25 DIAGNOSIS — I4891 Unspecified atrial fibrillation: Secondary | ICD-10-CM | POA: Diagnosis not present

## 2017-08-25 DIAGNOSIS — Z950 Presence of cardiac pacemaker: Secondary | ICD-10-CM

## 2017-08-25 DIAGNOSIS — I5022 Chronic systolic (congestive) heart failure: Secondary | ICD-10-CM | POA: Diagnosis not present

## 2017-08-25 NOTE — Telephone Encounter (Signed)
Confirmed remote transmission w/ pt daughter.   

## 2017-08-25 NOTE — Progress Notes (Signed)
EPIC Encounter for ICM Monitoring  Patient Name: ALZORA HA is a 84 y.o. female Date: 08/25/2017 Primary Care Physican: Aretta Nip, MD Primary [place] Electrophysiologist: Allred Dry Weight:does not weigh Bi-V Pacing: >99%  AT/AF Burden 1.4%         Attempted call to daughter and unable to reach.  Left detailed message, per DPR, regarding transmission.  Transmission reviewed.    Thoracic impedance appears normal.  Per Merlin rep, patient has high impedance leads so her baseline (reference line) normal is is 1600 oms. The Corvue readings will be evaluated from the perspective of >1600 oms baseline is the norm for her.  Prescribed dosage: Furosemide 40 mg 1 tablet daily and Potassium 20 mEq 1 tablet daily.  Labs: 03/18/2016 Creatinine 0.8, BUN 18,Potassium 3.9, Sodium 141 03/14/2016 Creatinine 0.74, BUN 8, Potassium 3.5, Sodium 137, EGFR >60 03/12/2016 Creatinine 0.88, BUN 14, Potassium 3.4, Sodium 138, EGFR 58 - >60 03/11/2016 Creatinine 0.95, BUN 17, Potassium 3.7, Sodium 138, EGFR 53 - >60  Recommendations: Left voice mail with ICM number and encouraged to call if experiencing any fluid symptoms.  Follow-up plan: ICM clinic phone appointment on 09/26/2017.    Copy of ICM check sent to Dr. Rayann Heman.   3 month ICM trend: 08/25/2017    1 Year ICM trend:       [name], RN 08/25/2017 5:12 PM

## 2017-08-26 NOTE — Progress Notes (Signed)
Remote pacemaker transmission.   

## 2017-09-12 ENCOUNTER — Ambulatory Visit: Payer: Medicare Other

## 2017-09-12 DIAGNOSIS — I4891 Unspecified atrial fibrillation: Secondary | ICD-10-CM

## 2017-09-12 DIAGNOSIS — Z5181 Encounter for therapeutic drug level monitoring: Secondary | ICD-10-CM | POA: Diagnosis not present

## 2017-09-12 LAB — POCT INR: INR: 1.2 — AB (ref 2.0–3.0)

## 2017-09-12 NOTE — Patient Instructions (Signed)
Description   Take 1.5 tablets today and tomorrow, then resume same dosage 1 tablet daily except 1/2 tablet on Tuesdays, Thursdays, and Saturdays.  Recheck INR in 10 days.

## 2017-09-22 ENCOUNTER — Ambulatory Visit: Payer: Medicare Other

## 2017-09-22 DIAGNOSIS — Z5181 Encounter for therapeutic drug level monitoring: Secondary | ICD-10-CM | POA: Diagnosis not present

## 2017-09-22 DIAGNOSIS — I4891 Unspecified atrial fibrillation: Secondary | ICD-10-CM

## 2017-09-22 LAB — POCT INR: INR: 1.9 — AB (ref 2.0–3.0)

## 2017-09-22 NOTE — Patient Instructions (Signed)
Description   Take 1 tablet today, then resume same dosage 1 tablet daily except 1/2 tablet on Tuesdays, Thursdays, and Saturdays.  Recheck INR in 3 weeks.

## 2017-10-03 ENCOUNTER — Other Ambulatory Visit: Payer: Self-pay | Admitting: Interventional Cardiology

## 2017-10-05 LAB — CUP PACEART REMOTE DEVICE CHECK
Battery Remaining Longevity: 71 mo
Battery Remaining Percentage: 89 %
Battery Voltage: 2.95 V
Brady Statistic AP VP Percent: 24 %
Brady Statistic AP VS Percent: 1 %
Brady Statistic AS VP Percent: 76 %
Brady Statistic AS VS Percent: 1 %
Brady Statistic RA Percent Paced: 23 %
Date Time Interrogation Session: 20190725184011
Implantable Lead Implant Date: 20081223
Implantable Lead Implant Date: 20081223
Implantable Lead Implant Date: 20081223
Implantable Lead Location: 753858
Implantable Lead Location: 753859
Implantable Lead Location: 753860
Implantable Lead Model: 5024
Implantable Lead Model: 5524
Implantable Pulse Generator Implant Date: 20160526
Lead Channel Impedance Value: 1125 Ohm
Lead Channel Impedance Value: 1150 Ohm
Lead Channel Impedance Value: 580 Ohm
Lead Channel Pacing Threshold Amplitude: 1.625 V
Lead Channel Pacing Threshold Amplitude: 2 V
Lead Channel Pacing Threshold Amplitude: 2.375 V
Lead Channel Pacing Threshold Pulse Width: 0.5 ms
Lead Channel Pacing Threshold Pulse Width: 0.7 ms
Lead Channel Pacing Threshold Pulse Width: 0.8 ms
Lead Channel Sensing Intrinsic Amplitude: 2.4 mV
Lead Channel Sensing Intrinsic Amplitude: 8.1 mV
Lead Channel Setting Pacing Amplitude: 2.625
Lead Channel Setting Pacing Amplitude: 3.375
Lead Channel Setting Pacing Amplitude: 3.5 V
Lead Channel Setting Pacing Pulse Width: 0.7 ms
Lead Channel Setting Pacing Pulse Width: 0.8 ms
Lead Channel Setting Sensing Sensitivity: 4.5 mV
Pulse Gen Model: 3222
Pulse Gen Serial Number: 7738731

## 2017-10-14 ENCOUNTER — Ambulatory Visit (INDEPENDENT_AMBULATORY_CARE_PROVIDER_SITE_OTHER): Payer: Medicare Other

## 2017-10-14 DIAGNOSIS — I4891 Unspecified atrial fibrillation: Secondary | ICD-10-CM | POA: Diagnosis not present

## 2017-10-14 DIAGNOSIS — Z5181 Encounter for therapeutic drug level monitoring: Secondary | ICD-10-CM

## 2017-10-14 LAB — POCT INR: INR: 2.2 (ref 2.0–3.0)

## 2017-10-14 NOTE — Patient Instructions (Signed)
Description   Continue on same dosage 1 tablet daily except 1/2 tablet on Tuesdays, Thursdays, and Saturdays.  Recheck INR in 4 weeks.

## 2017-10-28 ENCOUNTER — Encounter: Payer: Medicare Other | Admitting: Internal Medicine

## 2017-10-31 ENCOUNTER — Ambulatory Visit: Payer: Medicare Other | Admitting: Internal Medicine

## 2017-10-31 ENCOUNTER — Encounter: Payer: Self-pay | Admitting: Internal Medicine

## 2017-10-31 VITALS — BP 118/66 | HR 70 | Ht 63.0 in | Wt 129.4 lb

## 2017-10-31 DIAGNOSIS — I442 Atrioventricular block, complete: Secondary | ICD-10-CM

## 2017-10-31 DIAGNOSIS — I10 Essential (primary) hypertension: Secondary | ICD-10-CM | POA: Diagnosis not present

## 2017-10-31 DIAGNOSIS — I5022 Chronic systolic (congestive) heart failure: Secondary | ICD-10-CM

## 2017-10-31 DIAGNOSIS — Z950 Presence of cardiac pacemaker: Secondary | ICD-10-CM | POA: Diagnosis not present

## 2017-10-31 DIAGNOSIS — I4891 Unspecified atrial fibrillation: Secondary | ICD-10-CM

## 2017-10-31 NOTE — Progress Notes (Signed)
PCP: Aretta Nip, MD Primary Cardiologist: Dr Irish Lack Primary EP:  Dr Ezzard Flax is a 84 y.o. female who presents today for routine electrophysiology followup.  Since last being seen in our clinic, the patient reports doing reasonably well.  She has occasional fatigue.  Today, she denies symptoms of palpitations, chest pain, shortness of breath,  lower extremity edema, dizziness, presyncope, or syncope.  The patient is otherwise without complaint today.   Past Medical History:  Diagnosis Date  . Acute ischemic stroke ([place]) 08/23/2012  . Acute, but ill-defined, cerebrovascular disease 11/08/2012  . Arthritis    . Atrial fibrillation ([place])    a. s/p AVN ablation  . Cancer [facility]) 12/2010   breast with recurrence s/p recent XRT  . Complete heart block ([facility])    a. s/p STJ CRTP  . Compression fracture        . CVA 05/07/2008      . Fibrocystic breast   . GERD (gastroesophageal reflux disease)   . Glaucoma   . Hyperlipidemia   . Hypertension   . Orthostatic hypotension   . Overactive bladder   . Parkinsonism ([place])   . Pericarditis       . 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 Seasonal allergic rhinitis    Pos Skin Test 07-05-07  . Seizures ([place]) 2013      . Thoracic aneurysm without mention of rupture    Past Surgical History:  Procedure Laterality Date  . ABDOMINAL HYSTERECTOMY  1975   pt. unsure if laparascopic/vaginal/abdominal  . ABLATION  1980s   AVN ablation   . APPENDECTOMY    . BI-VENTRICULAR PACEMAKER UPGRADE  01/2007   3rd device, upgraded to SJM Frontier II BiV pacemaker by Dr Leonia Reeves  . BREAST SURGERY Right 01/15/11   mastectomy  . CATARACT EXTRACTION Bilateral   . CHOLECYSTECTOMY    . EP IMPLANTABLE DEVICE N/A 06/27/2014   CRT-P  ([facility]) gen change by Dr Rayann Heman  . EYE SURGERY     For glaucoma  . FLEXIBLE BRONCHOSCOPY W/ UPPER ENDOSCOPY     and swallowing evaluation  . KNEE ARTHROSCOPY Right    Arthroscopic for torn  meniscus  . MASS EXCISION Right 01/19/2013   Procedure: EXCISION CHEST WALL MASS;  Surgeon: [name], MD;  Location: [facility];  Service: General;  Laterality: Right;  . PACEMAKER INSERTION     1992; gen change 2004  . VESICOVAGINAL FISTULA CLOSURE W/ TAH      ROS- all systems are reviewed and negative except as per HPI above  Current Outpatient Medications  Medication Sig Dispense Refill  . acetaminophen (TYLENOL) 500 MG tablet Take 1,000 mg by mouth every 6 (six) hours as needed for moderate pain (pain).     Marland Kitchen anastrozole (ARIMIDEX) 1 MG tablet Take 1 tablet (1 mg total) by mouth daily. 90 tablet 3  . benzonatate (TESSALON) 100 MG capsule TAKE 1 CAPSULE BY MOUTH 3 TIMES A DAY AS NEEDED FOR COUGH 90 capsule 3  . Calcium Carbonate-Vitamin D (CALCIUM-VITAMIN D) 500-200 MG-UNIT per tablet Take 1 tablet by mouth daily.     . cephALEXin (KEFLEX) 250 MG capsule Take 250 mg by mouth at bedtime.    . colestipol (COLESTID) 1 G tablet Take 1 g by mouth at bedtime.    . furosemide (LASIX) 40 MG tablet TAKE 1 TABLET BY MOUTH  EVERY MORNING 90 tablet 3  . levETIRAcetam (KEPPRA) 250 MG tablet Take  250 mg by mouth 2 (two) times daily.    . Multiple Vitamin (MULTIVITAMIN WITH MINERALS) TABS tablet Take 1 tablet by mouth daily.    . pantoprazole (PROTONIX) 40 MG tablet Take 1 tablet by mouth daily.    . potassium chloride SA (K-DUR,KLOR-CON) 20 MEQ tablet TAKE 1 TABLET BY MOUTH  DAILY 90 tablet 3  . pravastatin (PRAVACHOL) 80 MG tablet TAKE 1 TABLET BY MOUTH  DAILY 90 tablet 3  . Probiotic Product (ALIGN) 4 MG CAPS Take 4 mg by mouth daily. 30 capsule 0  . propranolol (INDERAL) 60 MG tablet TAKE ONE-HALF TABLET BY  MOUTH TWICE A DAY 90 tablet 3  . warfarin (COUMADIN) 2.5 MG tablet TAKE BY MOUTH AS DIRECTED  BY COUMADIN CLINIC 90 tablet 1   No current facility-administered medications for this visit.     Physical Exam: Vitals:   10/31/17 1439  BP: 118/66  Pulse: 70  SpO2: 97%  Weight: 129  lb 6.4 oz (58.7 kg)  Height: 5\' 3"  (1.6 m)    GEN- The patient is elderly appearing, alert and oriented x 3 today.   Head- normocephalic, atraumatic Eyes-  Sclera clear, conjunctiva pink Ears- hearing intact Oropharynx- clear Lungs- Clear to ausculation bilaterally, normal work of breathing Chest- pacemaker pocket is well healed Heart- Regular rate and rhythm (paced) GI- soft, NT, ND, + BS Extremities- no clubbing, cyanosis, or edema  Pacemaker interrogation- reviewed in detail today,  See PACEART report  ekg tracing ordered today is personally reviewed and shows typical atrial flutter  Assessment and Plan:  1. Symptomatic complete heart block Normal BiV pacemaker function See Pace Art report No changes today  2. Chronic systolic dysfunction Good response to CRT Followed in ICM clinic coreview is abnormal for the past 2 weeks (corresponds to atrial flutter episode).  I have pace terminated atrial flutter today.  I have recalibarated coreview and discussed with Sharman Cheek today.  3. HTN Stable No change required today  4. Atrial fibrillation/ atrial flutter She is in atrial flutter today, since 10/20/17.  This corresponds to elevation in coreview. On coumadin and last INR therapeutic. Risks and benefits to pace termination of atrial flutter through her device was discussed with patient and her daughter today.  They understand risks (includinig stroke) and wish to proceed.  They report compliance with coumadin without interruption.  Atrial flutter cycle length was 250 msec.  Atrial flutter was successfully pace terminated using NIPS through her pacemaker at 170 msec.  She remains in sinus rhythm thereafter.  Merlin Return to see EP NP every year  Sharman Cheek to continue to follow ICM Follow-up with Dr Irish Lack as scheduled  Thompson Grayer MD, [facility] 10/31/2017 3:07 PM

## 2017-10-31 NOTE — Patient Instructions (Addendum)
Medication Instructions:  Your physician recommends that you continue on your current medications as directed. Please refer to the Current Medication list given to you today.  Labwork: None ordered.  Testing/Procedures: None ordered.  Follow-Up: Your physician wants you to follow-up in: one year with Chanetta Marshall, NP.   You will receive a reminder letter in the mail two months in advance. If you don't receive a letter, please call our office to schedule the follow-up appointment.  Remote monitoring is used to monitor your Pacemaker from home. This monitoring reduces the number of office visits required to check your device to one time per year. It allows [place] to keep an eye on the functioning of your device to ensure it is working properly. You are scheduled for a device check from home on 11/24/2017. You may send your transmission at any time that day. If you have a wireless device, the transmission will be sent automatically. After your physician reviews your transmission, you will receive a postcard with your next transmission date.  Any Other Special Instructions Will Be Listed Below (If Applicable).  If you need a refill on your cardiac medications before your next appointment, please call your pharmacy.

## 2017-11-17 ENCOUNTER — Ambulatory Visit: Payer: Medicare Other | Admitting: *Deleted

## 2017-11-17 DIAGNOSIS — I4891 Unspecified atrial fibrillation: Secondary | ICD-10-CM

## 2017-11-17 DIAGNOSIS — Z5181 Encounter for therapeutic drug level monitoring: Secondary | ICD-10-CM

## 2017-11-17 LAB — POCT INR: INR: 2.1 (ref 2.0–3.0)

## 2017-11-17 NOTE — Patient Instructions (Signed)
Description   Continue on same dosage 1 tablet daily except 1/2 tablet on Tuesdays, Thursdays, and Saturdays.  Recheck INR in 4 weeks.

## 2017-11-21 ENCOUNTER — Other Ambulatory Visit: Payer: Self-pay | Admitting: Oncology

## 2017-11-21 ENCOUNTER — Other Ambulatory Visit: Payer: Self-pay | Admitting: Interventional Cardiology

## 2017-11-23 ENCOUNTER — Other Ambulatory Visit: Payer: Self-pay | Admitting: Interventional Cardiology

## 2017-11-23 MED ORDER — PROPRANOLOL HCL 60 MG PO TABS: 30.0000 mg | ORAL_TABLET | Freq: Two times a day (BID) | ORAL | 0 refills | Status: DC

## 2017-11-23 MED ORDER — FUROSEMIDE 40 MG PO TABS: 40.0000 mg | ORAL_TABLET | Freq: Every morning | ORAL | 0 refills | Status: DC

## 2017-11-23 MED ORDER — PRAVASTATIN SODIUM 80 MG PO TABS: 80.0000 mg | ORAL_TABLET | Freq: Every day | ORAL | 0 refills | Status: DC

## 2017-11-28 ENCOUNTER — Encounter: Payer: Self-pay | Admitting: Interventional Cardiology

## 2017-12-05 ENCOUNTER — Ambulatory Visit (INDEPENDENT_AMBULATORY_CARE_PROVIDER_SITE_OTHER): Payer: Medicare Other | Admitting: *Deleted

## 2017-12-05 DIAGNOSIS — I442 Atrioventricular block, complete: Secondary | ICD-10-CM | POA: Diagnosis not present

## 2017-12-05 NOTE — Progress Notes (Signed)
Remote pacemaker transmission.   

## 2017-12-06 ENCOUNTER — Telehealth: Payer: Self-pay

## 2017-12-06 NOTE — Telephone Encounter (Signed)
Call to daughter regarding Corvue transmission.  Advised the calibration of impedance baseline that was done at last office visit 10/31/2017 with Dr Rayann Heman did not show a change in the impedance baseline.  The baseline remains at 1630 and unable to read fluids levels resulting inaccuracy.  Advised will discuss with Dr Rayann Heman and call her back.

## 2017-12-08 ENCOUNTER — Encounter: Payer: Self-pay | Admitting: Cardiology

## 2017-12-13 ENCOUNTER — Other Ambulatory Visit: Payer: Self-pay | Admitting: Interventional Cardiology

## 2017-12-19 NOTE — Progress Notes (Signed)
Cardiology Office Note   Date:  12/20/2017   ID:  Debra Lynn, DOB 1928-11-14, MRN [id number]  PCP:  Aretta Nip, MD    No chief complaint on file.  AFib  Wt Readings from Last 3 Encounters:  12/20/17 141 lb (64 kg)  10/31/17 129 lb 6.4 oz (58.7 kg)  01/13/17 152 lb 4.8 oz (69.1 kg)       History of Present Illness: Debra Lynn is a 84 y.o. female  with pacemaker and atrial fibrillation and prior CVA. She has had unsteadiness and lightheadedness as well. She sees a  neurologist for her neuropathy.   She used to have this issue with standing. Now it is more frequent and not always dependent on change in positions. Lying down gives relief. SHe is walking with a walker, now most of the time.   She had a chest wall cancer recurrence.  Since the last visit, she has not fallen- using walker regularly.  She eats less and has lost weight.  Denies : Chest pain. Dizziness. Nitroglycerin use. Orthopnea. Palpitations. Paroxysmal nocturnal dyspnea.  Syncope.   Coumadin well regulated.  Feels more SHOB when she does not exercise regularly.  Occasional leg edema.  She was out of rhythm at last pacer check in 9/19.    Debra Lynn has been steady.       Past Medical History:  Diagnosis Date  . Acute ischemic stroke ([place]) 08/23/2012  . Acute, but ill-defined, cerebrovascular disease 11/08/2012  . Arthritis    . Atrial fibrillation ([place])    a. s/p AVN ablation  . Cancer [facility]) 12/2010   breast with recurrence s/p recent XRT  . Complete heart block (H[facility])    a. s/p STJ CRTP  . Compression fracture        . CVA 05/07/2008      . Fibrocystic breast   . GERD (gastroesophageal reflux disease)   . Glaucoma   . Hyperlipidemia   . Hypertension   . Orthostatic hypotension   . Overactive bladder   . Parkinsonism ([place])   . Pericarditis       . 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 Seasonal allergic rhinitis    Pos Skin Test 07-05-07  .  Seizures ([place]) 2013      . Thoracic aneurysm without mention of rupture     Past Surgical History:  Procedure Laterality Date  . ABDOMINAL HYSTERECTOMY  1975   pt. unsure if laparascopic/vaginal/abdominal  . ABLATION  1980s   AVN ablation   . APPENDECTOMY    . BI-VENTRICULAR PACEMAKER UPGRADE  01/2007   3rd device, upgraded to SJM Frontier II BiV pacemaker by Dr Leonia Reeves  . BREAST SURGERY Right 01/15/11   mastectomy  . CATARACT EXTRACTION Bilateral   . CHOLECYSTECTOMY    . EP IMPLANTABLE DEVICE N/A 06/27/2014   CRT-P  (SJM) gen change by Dr Rayann Heman  . EYE SURGERY     For glaucoma  . FLEXIBLE BRONCHOSCOPY W/ UPPER ENDOSCOPY     and swallowing evaluation  . KNEE ARTHROSCOPY Right    Arthroscopic for torn meniscus  . MASS EXCISION Right 01/19/2013   Procedure: EXCISION CHEST WALL MASS;  Surgeon: [name], MD;  Location: [facility];  Service: General;  Laterality: Right;  . PACEMAKER INSERTION     1992; gen change 2004  . VESICOVAGINAL FISTULA CLOSURE W/ TAH       Current Outpatient Medications  Medication Sig Dispense Refill  .  acetaminophen (TYLENOL) 500 MG tablet Take 1,000 mg by mouth every 6 (six) hours as needed for moderate pain (pain).     Marland Kitchen anastrozole (ARIMIDEX) 1 MG tablet TAKE 1 TABLET BY MOUTH  DAILY 90 tablet 3  . benzonatate (TESSALON) 100 MG capsule TAKE 1 CAPSULE BY MOUTH 3 TIMES A DAY AS NEEDED FOR COUGH 90 capsule 3  . Calcium Carbonate-Vitamin D (CALCIUM-VITAMIN D) 500-200 MG-UNIT per tablet Take 1 tablet by mouth daily.     . cephALEXin (KEFLEX) 250 MG capsule Take 250 mg by mouth at bedtime.    . colestipol (COLESTID) 1 G tablet Take 1 g by mouth at bedtime.    . furosemide (LASIX) 40 MG tablet TAKE 1 TABLET BY MOUTH  EVERY MORNING 90 tablet 3  . levETIRAcetam (KEPPRA) 250 MG tablet Take 250 mg by mouth 2 (two) times daily.    . Multiple Vitamin (MULTIVITAMIN WITH MINERALS) TABS tablet Take 1 tablet by mouth daily.    . pantoprazole (PROTONIX) 40 MG  tablet Take 1 tablet by mouth daily.    . potassium chloride SA (K-DUR,KLOR-CON) 20 MEQ tablet TAKE 1 TABLET BY MOUTH  DAILY 90 tablet 3  . pravastatin (PRAVACHOL) 80 MG tablet Take 1 tablet (80 mg total) by mouth daily. Please keep upcoming appt in November with Dr. Irish Lack for future refills. Thank you 90 tablet 0  . Probiotic Product (ALIGN) 4 MG CAPS Take 4 mg by mouth daily. 30 capsule 0  . propranolol (INDERAL) 60 MG tablet TAKE ONE-HALF TABLET BY  MOUTH TWICE A DAY 90 tablet 11  . warfarin (COUMADIN) 2.5 MG tablet TAKE BY MOUTH AS DIRECTED  BY COUMADIN CLINIC 90 tablet 1   No current facility-administered medications for this visit.     Allergies:   Patient has no known allergies.    Social History:  The patient  reports that she has never smoked. She has never used smokeless tobacco. She reports that she does not drink alcohol or use drugs.   Family History:  The patient's family history includes Asthma in her brother and daughter; Cancer in her father and unknown relative; Diabetes in her brother; Heart attack in her brother; Heart disease in her unknown relative; Heart failure in her mother; Hypertension in her brother; Lymphoma in her brother.    ROS:  Please see the history of present illness.   Otherwise, review of systems are positive for knee pain.   All other systems are reviewed and negative.    PHYSICAL EXAM: VS:  BP 120/70   Pulse 67   Ht 5\' 3"  (1.6 m)   Wt 141 lb (64 kg)   SpO2 95%   BMI 24.98 kg/m  , BMI Body mass index is 24.98 kg/m. GEN: Well nourished, well developed, in no acute distress  HEENT: normal  Neck: no JVD, carotid bruits, or masses Cardiac: irregular HR; no murmurs, rubs, or gallops,; tr edema  Respiratory:  clear to auscultation bilaterally, normal work of breathing GI: soft, nontender, nondistended, + BS MS: no deformity or atrophy  Skin: warm and dry, no rash Neuro:  Strength and sensation are intact Psych: euthymic mood, full  affect   EKG:   The ekg ordered today demonstrates    Recent Labs: 01/06/2017: ALT 15; BUN 19.9; Creatinine 1.1; HGB 13.6; Platelets 176; Potassium 4.3; Sodium 140   Lipid Panel    Component Value Date/Time   CHOL 186 07/22/2014 0921   TRIG 274.0 (H) 07/22/2014 0921   HDL  59.00 07/22/2014 0921   CHOLHDL 3 07/22/2014 0921   VLDL 54.8 (H) 07/22/2014 0921   LDLCALC 97 06/21/2013 1001   LDLDIRECT 84.0 07/22/2014 0921     Other studies Reviewed: Additional studies/ records that were reviewed today with results demonstrating: labs reviewed.   ASSESSMENT AND PLAN:  1. AFib: Rate controlled.  Also paced at times. Coumadin for stroke prevention.  2. Hyperlipdemia: LDL 78.  Controlled on pravastatin. 3. HTN: The current medical regimen is effective;  continue present plan and medications. 4. DOE: Chronic.  [place].  Increase exercise.  5. COntinue pacer checks: Follows with Dr. Rayann Heman.    Current medicines are reviewed at length with the patient today.  The patient concerns regarding her medicines were addressed.  The following changes have been made:  No change  Labs/ tests ordered today include:  No orders of the defined types were placed in this encounter.   Recommend 150 minutes/week of aerobic exercise Low fat, low carb, high fiber diet recommended  Disposition:   FU in 1 year   Signed, Larae Grooms, MD  12/20/2017 1:47 PM    [facility] [place], [place], [place]  [address] Phone: [phone]; Fax: [phone]

## 2017-12-20 ENCOUNTER — Ambulatory Visit: Payer: Medicare Other | Admitting: Interventional Cardiology

## 2017-12-20 ENCOUNTER — Ambulatory Visit (INDEPENDENT_AMBULATORY_CARE_PROVIDER_SITE_OTHER): Payer: Medicare Other | Admitting: *Deleted

## 2017-12-20 ENCOUNTER — Encounter: Payer: Self-pay | Admitting: Interventional Cardiology

## 2017-12-20 VITALS — BP 120/70 | HR 67 | Ht 63.0 in | Wt 141.0 lb

## 2017-12-20 DIAGNOSIS — E782 Mixed hyperlipidemia: Secondary | ICD-10-CM | POA: Diagnosis not present

## 2017-12-20 DIAGNOSIS — I4891 Unspecified atrial fibrillation: Secondary | ICD-10-CM

## 2017-12-20 DIAGNOSIS — I10 Essential (primary) hypertension: Secondary | ICD-10-CM | POA: Diagnosis not present

## 2017-12-20 DIAGNOSIS — Z5181 Encounter for therapeutic drug level monitoring: Secondary | ICD-10-CM | POA: Diagnosis not present

## 2017-12-20 DIAGNOSIS — I4821 Permanent atrial fibrillation: Secondary | ICD-10-CM | POA: Diagnosis not present

## 2017-12-20 DIAGNOSIS — Z950 Presence of cardiac pacemaker: Secondary | ICD-10-CM | POA: Diagnosis not present

## 2017-12-20 DIAGNOSIS — R0609 Other forms of dyspnea: Secondary | ICD-10-CM

## 2017-12-20 DIAGNOSIS — R0600 Dyspnea, unspecified: Secondary | ICD-10-CM

## 2017-12-20 LAB — POCT INR: INR: 2.5 (ref 2.0–3.0)

## 2017-12-20 NOTE — Patient Instructions (Signed)
Description   Continue on same dosage 1 tablet daily except 1/2 tablet on Tuesdays, Thursdays, and Saturdays.  Recheck INR in 5 weeks.

## 2017-12-20 NOTE — Patient Instructions (Signed)

## 2017-12-20 NOTE — Telephone Encounter (Signed)
Call to daughter.  Advised I consulted with Dr Rayann Heman regarding device fluid readings. Explained the device is no longer reading fluid levels but every thing is functioning properly. Explained scar tissue can develop that interferes with fluid readings according to the [facility] Rep. I will not monitor fluid levels monthly but the device clinic will continue to monitor per protocol on a 3 month basis.  Advised to call device clinic for any further questions regarding the function or problems with the device.  Patient disenrolled from [facility] clinic and Dr Rayann Heman agreed.

## 2018-01-18 ENCOUNTER — Encounter: Payer: Self-pay | Admitting: Oncology

## 2018-01-23 ENCOUNTER — Ambulatory Visit: Payer: Medicare Other

## 2018-01-23 DIAGNOSIS — Z5181 Encounter for therapeutic drug level monitoring: Secondary | ICD-10-CM

## 2018-01-23 DIAGNOSIS — I4891 Unspecified atrial fibrillation: Secondary | ICD-10-CM

## 2018-01-23 LAB — POCT INR: INR: 2 (ref 2.0–3.0)

## 2018-01-23 NOTE — Patient Instructions (Signed)
Description   Take 1.5 tablets today, then resume same dosage 1 tablet daily except 1/2 tablet on Tuesdays, Thursdays, and Saturdays.  Recheck INR in 6 weeks.

## 2018-02-02 ENCOUNTER — Inpatient Hospital Stay: Payer: Medicare Other | Attending: Oncology

## 2018-02-02 ENCOUNTER — Inpatient Hospital Stay (HOSPITAL_BASED_OUTPATIENT_CLINIC_OR_DEPARTMENT_OTHER): Payer: Medicare Other | Admitting: Oncology

## 2018-02-02 VITALS — BP 119/63 | HR 65 | Temp 97.4°F | Resp 18 | Ht 63.0 in | Wt 143.5 lb

## 2018-02-02 DIAGNOSIS — Z90710 Acquired absence of both cervix and uterus: Secondary | ICD-10-CM | POA: Insufficient documentation

## 2018-02-02 DIAGNOSIS — Z923 Personal history of irradiation: Secondary | ICD-10-CM | POA: Diagnosis not present

## 2018-02-02 DIAGNOSIS — G20 Parkinson's disease: Secondary | ICD-10-CM | POA: Insufficient documentation

## 2018-02-02 DIAGNOSIS — Z87440 Personal history of urinary (tract) infections: Secondary | ICD-10-CM | POA: Diagnosis not present

## 2018-02-02 DIAGNOSIS — I4891 Unspecified atrial fibrillation: Secondary | ICD-10-CM

## 2018-02-02 DIAGNOSIS — C7989 Secondary malignant neoplasm of other specified sites: Secondary | ICD-10-CM

## 2018-02-02 DIAGNOSIS — Z79899 Other long term (current) drug therapy: Secondary | ICD-10-CM | POA: Insufficient documentation

## 2018-02-02 DIAGNOSIS — Z79811 Long term (current) use of aromatase inhibitors: Secondary | ICD-10-CM | POA: Insufficient documentation

## 2018-02-02 DIAGNOSIS — E785 Hyperlipidemia, unspecified: Secondary | ICD-10-CM

## 2018-02-02 DIAGNOSIS — C50919 Malignant neoplasm of unspecified site of unspecified female breast: Secondary | ICD-10-CM

## 2018-02-02 DIAGNOSIS — I10 Essential (primary) hypertension: Secondary | ICD-10-CM

## 2018-02-02 DIAGNOSIS — Z170 Estrogen receptor positive status [ER+]: Secondary | ICD-10-CM | POA: Diagnosis not present

## 2018-02-02 DIAGNOSIS — Z7901 Long term (current) use of anticoagulants: Secondary | ICD-10-CM

## 2018-02-02 DIAGNOSIS — M8580 Other specified disorders of bone density and structure, unspecified site: Secondary | ICD-10-CM

## 2018-02-02 DIAGNOSIS — Z950 Presence of cardiac pacemaker: Secondary | ICD-10-CM | POA: Insufficient documentation

## 2018-02-02 DIAGNOSIS — C50911 Malignant neoplasm of unspecified site of right female breast: Secondary | ICD-10-CM

## 2018-02-02 DIAGNOSIS — K219 Gastro-esophageal reflux disease without esophagitis: Secondary | ICD-10-CM

## 2018-02-02 DIAGNOSIS — Z9011 Acquired absence of right breast and nipple: Secondary | ICD-10-CM | POA: Diagnosis not present

## 2018-02-02 DIAGNOSIS — R42 Dizziness and giddiness: Secondary | ICD-10-CM | POA: Diagnosis not present

## 2018-02-02 DIAGNOSIS — Z808 Family history of malignant neoplasm of other organs or systems: Secondary | ICD-10-CM | POA: Diagnosis not present

## 2018-02-02 DIAGNOSIS — Z8673 Personal history of transient ischemic attack (TIA), and cerebral infarction without residual deficits: Secondary | ICD-10-CM | POA: Insufficient documentation

## 2018-02-02 DIAGNOSIS — C50211 Malignant neoplasm of upper-inner quadrant of right female breast: Secondary | ICD-10-CM

## 2018-02-02 LAB — CBC WITH DIFFERENTIAL/PLATELET
Abs Immature Granulocytes: 0.02 10*3/uL (ref 0.00–0.07)
Basophils Absolute: 0 10*3/uL (ref 0.0–0.1)
Basophils Relative: 1 %
Eosinophils Absolute: 0.2 10*3/uL (ref 0.0–0.5)
Eosinophils Relative: 3 %
HCT: 37.7 % (ref 36.0–46.0)
Hemoglobin: 12.3 g/dL (ref 12.0–15.0)
Immature Granulocytes: 0 %
Lymphocytes Relative: 30 %
Lymphs Abs: 1.9 10*3/uL (ref 0.7–4.0)
MCH: 29.2 pg (ref 26.0–34.0)
MCHC: 32.6 g/dL (ref 30.0–36.0)
MCV: 89.5 fL (ref 80.0–100.0)
Monocytes Absolute: 0.5 10*3/uL (ref 0.1–1.0)
Monocytes Relative: 8 %
Neutro Abs: 3.6 10*3/uL (ref 1.7–7.7)
Neutrophils Relative %: 58 %
Platelets: 182 10*3/uL (ref 150–400)
RBC: 4.21 MIL/uL (ref 3.87–5.11)
RDW: 13.6 % (ref 11.5–15.5)
WBC: 6.2 10*3/uL (ref 4.0–10.5)
nRBC: 0 % (ref 0.0–0.2)

## 2018-02-02 LAB — COMPREHENSIVE METABOLIC PANEL
ALT: 19 U/L (ref 0–44)
AST: 20 U/L (ref 15–41)
Albumin: 4 g/dL (ref 3.5–5.0)
Alkaline Phosphatase: 39 U/L (ref 38–126)
Anion gap: 10 (ref 5–15)
BUN: 17 mg/dL (ref 8–23)
CO2: 25 mmol/L (ref 22–32)
Calcium: 9.8 mg/dL (ref 8.9–10.3)
Chloride: 106 mmol/L (ref 98–111)
Creatinine, Ser: 0.87 mg/dL (ref 0.44–1.00)
GFR calc Af Amer: >60 mL/min (ref >60)
GFR calc non Af Amer: 60 mL/min — ABNORMAL LOW (ref >60)
Glucose, Bld: 98 mg/dL (ref 70–99)
Potassium: 3.9 mmol/L (ref 3.5–5.1)
Sodium: 141 mmol/L (ref 135–145)
Total Bilirubin: 0.4 mg/dL (ref 0.3–1.2)
Total Protein: 7.6 g/dL (ref 6.5–8.1)

## 2018-02-02 NOTE — Progress Notes (Signed)
ID: Debra Lynn   DOB: [birth year]  MR#: [id number]  PZW#:[id number]  PCP: Aretta Nip, MD GYN: SURolm Bookbinder OTHER UM:PNTIRWE [name]  CHIEF COMPLAINT: Locally recurrent breast cancer  CURRENT TREATMENT: Anastrozole  BREAST CANCER HISTORY: From the original intake note:  The patient had routine screening mammography 12/15/2010 at [place]. She has heterogeneously dense breast tissue. There was a suggestion of a new mass in the upper inner quadrant of the right breast and she was brought back November 16 for right diagnostic mammography and ultrasonography. Spot compression views confirmed a 1.8 cm multilobulated mass which by ultrasound measured 2.1 cm, was ill-defined and hypoechoic. Biopsies November 19 ([id number]) showed an invasive ductal carcinoma, grade 2, strongly e-cadherin positive, estrogen receptor 90% positive, progesterone receptor negative, with no HER to amplification by CISH. The MIB-1 was 25%.  The patient is not a candidate for MRI because she has a pacemaker in place. Accordingly she underwent BSGI, showing a solitary focus of increased uptake, measuring 2.3 cm. With this information she was seen at the [facility] and a recommendation tomproceed with surgery was made. She had a Right simple mastectomy 01/15/2011.   Her subsequent history is as detailed below.   INTERVAL HISTORY: Debra Lynn returns today for follow-up and treatment of her locally recurrent breast cancer accompanied by her daughter.   She remains on anastrozole, with good tolerance. She denies having any issues with hot flashes or vaginal dryness.  Since her last visit, she underwent bone density testing on 02/17/17 showing a T-score of -2.8. She is scheduled for her next scan on 02/20/2018.  She also underwent her yearly mammogram on 01/18/18 showing breast density category C and no evidence of malignancy.   REVIEW OF SYSTEMS: Debra Lynn reports trouble bending over; she gets dizzy but has not fallen. Her  daughter notes this is not new. She also states the patient takes keflex nightly to prevent further UTIs. The patient denies unusual headaches, visual changes, nausea, vomiting, stiff neck, or gait imbalance. There has been no cough, phlegm production, or pleurisy, no chest pain or pressure, and no change in bowel or bladder habits. The patient denies fever, rash, bleeding, unexplained fatigue or unexplained weight loss. A detailed review of systems was otherwise entirely negative.   PAST MEDICAL HISTORY: Past Medical History:  Diagnosis Date  . Acute ischemic stroke ([place]) 08/23/2012  . Acute, but ill-defined, cerebrovascular disease 11/08/2012  . Arthritis    . Atrial fibrillation ([place])    a. s/p AVN ablation  . Cancer [facility]) 12/2010   breast with recurrence s/p recent XRT  . Complete heart block ([facility])    a. s/p STJ CRTP  . Compression fracture        . CVA 05/07/2008      . Fibrocystic breast   . GERD (gastroesophageal reflux disease)   . Glaucoma   . Hyperlipidemia   . Hypertension   . Orthostatic hypotension   . Overactive bladder   . Parkinsonism ([place])   . Pericarditis       . 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 Seasonal allergic rhinitis    Pos Skin Test 07-05-07  . Seizures ([place]) 2013      . Thoracic aneurysm without mention of rupture     PAST SURGICAL HISTORY: Past Surgical History:  Procedure Laterality Date  . ABDOMINAL HYSTERECTOMY  1975   pt. unsure if laparascopic/vaginal/abdominal  . ABLATION  1980s   AVN ablation   . APPENDECTOMY    .  BI-VENTRICULAR PACEMAKER UPGRADE  01/2007   3rd device, upgraded to SJM Frontier II BiV pacemaker by Dr Leonia Reeves  . BREAST SURGERY Right 01/15/11   mastectomy  . CATARACT EXTRACTION Bilateral   . CHOLECYSTECTOMY    . EP IMPLANTABLE DEVICE N/A 06/27/2014   CRT-P  (SJM) gen change by Dr Rayann Heman  . EYE SURGERY     For glaucoma  . FLEXIBLE BRONCHOSCOPY W/ UPPER ENDOSCOPY     and swallowing evaluation  .  KNEE ARTHROSCOPY Right    Arthroscopic for torn meniscus  . MASS EXCISION Right 01/19/2013   Procedure: EXCISION CHEST WALL MASS;  Surgeon: [name], MD;  Location: [facility];  Service: General;  Laterality: Right;  . PACEMAKER INSERTION     1992; gen change 2004  . VESICOVAGINAL FISTULA CLOSURE W/ TAH      FAMILY HISTORY Family History  Problem Relation Age of Onset  . Heart failure Mother   . Cancer Father        brain  . Asthma Brother   . Asthma Daughter   . Heart disease Unknown   . Cancer Unknown   . Lymphoma Brother   . Diabetes Brother   . Hypertension Brother   . Heart attack Brother   The patient's father died at the age of 69 from central nervous system cancer. The patient's mother died at the age of 51. The patient had one brother who had a history of lymphoma, but survives.  GYNECOLOGIC HISTORY: GX P2. 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 Her husband [name] used to work for [employer]. Daughter [name] lives in [place] and is a retired Merchant navy officer. [name] is a widow of my former patient "[name]" [name] Daughter Aleiyah Halpin lives in [place] where she works as a Orthoptist. The patient has 2 grandsons. She is not a Ambulance person.    ADVANCED DIRECTIVES: in place  HEALTH MAINTENANCE: Social History   Tobacco Use  . Smoking status: Never Smoker  . Smokeless tobacco: Never Used  Substance Use Topics  . Alcohol use: No  . Drug use: No     Colonoscopy:  PAP:  Bone density: SOLIS Jan 2013, T - 1.7 R fem neck  Lipid panel:  No Known Allergies  Current Outpatient Medications  Medication Sig Dispense Refill  . acetaminophen (TYLENOL) 500 MG tablet Take 1,000 mg by mouth every 6 (six) hours as needed for moderate pain (pain).     Marland Kitchen anastrozole (ARIMIDEX) 1 MG  tablet TAKE 1 TABLET BY MOUTH  DAILY 90 tablet 3  . benzonatate (TESSALON) 100 MG capsule TAKE 1 CAPSULE BY MOUTH 3 TIMES A DAY AS NEEDED FOR COUGH 90 capsule 3  . Calcium Carbonate-Vitamin D (CALCIUM-VITAMIN D) 500-200 MG-UNIT per tablet Take 1 tablet by mouth daily.     . cephALEXin (KEFLEX) 250 MG capsule Take 250 mg by mouth at bedtime.    . colestipol (COLESTID) 1 G tablet Take 1 g by mouth at bedtime.    . furosemide (LASIX) 40 MG tablet TAKE 1 TABLET BY MOUTH  EVERY MORNING 90 tablet 3  . levETIRAcetam (KEPPRA) 250 MG tablet Take 250 mg by mouth 2 (two) times daily.    . Multiple Vitamin (MULTIVITAMIN WITH MINERALS) TABS tablet Take 1 tablet by mouth daily.    . pantoprazole (PROTONIX)  40 MG tablet Take 1 tablet by mouth daily.    . potassium chloride SA (K-DUR,KLOR-CON) 20 MEQ tablet TAKE 1 TABLET BY MOUTH  DAILY 90 tablet 3  . pravastatin (PRAVACHOL) 80 MG tablet Take 1 tablet (80 mg total) by mouth daily. Please keep upcoming appt in November with Dr. Irish Lack for future refills. Thank you 90 tablet 0  . Probiotic Product (ALIGN) 4 MG CAPS Take 4 mg by mouth daily. 30 capsule 0  . propranolol (INDERAL) 60 MG tablet TAKE ONE-HALF TABLET BY  MOUTH TWICE A DAY 90 tablet 11  . warfarin (COUMADIN) 2.5 MG tablet TAKE BY MOUTH AS DIRECTED  BY COUMADIN CLINIC 90 tablet 1   No current facility-administered medications for this visit.     OBJECTIVE: Elderly white woman in no acute distress  Vitals:   02/02/18 1152  BP: 119/63  Pulse: 65  Resp: 18  Temp: (!) 97.4 F (36.3 C)  SpO2: 99%     Body mass index is 25.42 kg/m.    ECOG FS: 2  Sclerae unicteric, pupils round and equal No cervical or supraclavicular adenopathy Lungs no rales or rhonchi Heart regular rate and rhythm Abd soft, nontender, positive bowel sounds MSK no focal spinal tenderness, no upper extremity lymphedema Neuro: nonfocal, pleasant affect Breasts: Status post right mastectomy, with no evidence of disease  recurrence.  Left breast is benign.  Both axillae are benign.   LAB RESULTS: Lab Results  Component Value Date   WBC 6.2 02/02/2018   NEUTROABS 3.6 02/02/2018   HGB 12.3 02/02/2018   HCT 37.7 02/02/2018   MCV 89.5 02/02/2018   PLT 182 02/02/2018      Chemistry      Component Value Date/Time   NA 141 02/02/2018 1137   NA 140 01/06/2017 1307   K 3.9 02/02/2018 1137   K 4.3 01/06/2017 1307   CL 106 02/02/2018 1137   CL 104 03/23/2012 1329   CO2 25 02/02/2018 1137   CO2 26 01/06/2017 1307   BUN 17 02/02/2018 1137   BUN 19.9 01/06/2017 1307   CREATININE 0.87 02/02/2018 1137   CREATININE 1.1 01/06/2017 1307   GLU 102 03/18/2016      Component Value Date/Time   CALCIUM 9.8 02/02/2018 1137   CALCIUM 10.3 01/06/2017 1307   ALKPHOS 39 02/02/2018 1137   ALKPHOS 34 (L) 01/06/2017 1307   AST 20 02/02/2018 1137   AST 23 01/06/2017 1307   ALT 19 02/02/2018 1137   ALT 15 01/06/2017 1307   BILITOT 0.4 02/02/2018 1137   BILITOT 0.53 01/06/2017 1307       Lab Results  Component Value Date   LABCA2 15 12/30/2010    No components found for: [id number]  No results for input(s): INR in the last 168 hours.  Urinalysis    Component Value Date/Time   COLORURINE YELLOW 03/11/2016 2126   APPEARANCEUR CLOUDY (A) 03/11/2016 2126   LABSPEC 1.021 03/11/2016 2126   PHURINE 5.0 03/11/2016 2126   GLUCOSEU NEGATIVE 03/11/2016 2126   HGBUR NEGATIVE 03/11/2016 2126   BILIRUBINUR NEGATIVE 03/11/2016 2126   KETONESUR NEGATIVE 03/11/2016 2126   PROTEINUR 100 (A) 03/11/2016 2126   UROBILINOGEN 0.2 12/14/2013 1608   NITRITE NEGATIVE 03/11/2016 2126   LEUKOCYTESUR LARGE (A) 03/11/2016 2126    STUDIES: 01/18/2018 mammography at [facility], left breast only, discussed with the patient  ASSESSMENT: 85 y.o. [place] woman status post simple mastectomy 01/15/2011 for a Right upper inner quadrant T2 NX (stage II) invasive ductal  carcinoma, grade 3, 100% estrogen receptor positive, with a  borderline MIB-1, progesterone receptor and HER-2 negative.  (1) not a candidate for tamoxifen due to cardiac issues and decided against aromatase inhibitors because of osteopenia present at baseline and concerns re. bisphosphonates  (2) local recurrence to the right chest wall documented by biopsy 01-2013 showing an invasive ductal carcinoma, grade 1 or 2, estrogen receptor strongly positive, progesterone receptor and HER-2 negative, with an MIB-1 of 28%.  (3) excision of the right chest wall mass 01/19/2013 with negative but close margins (the deep margin is skeletal muscle).  (4) status post radiation therapy to the right chest wall completed 04/23/2013  (5) Ct chest January 2015 shows small bilateral lung lesions many of which are longstanding; repeat chest CT November 2015 showed no change; CXR 10/15/2015 negative, repeat chest x-ray February 2018 also unremarkable  (6) anastrozole started January 2015; discontinued January 2020  (a) osteopenia per bone density January 2013 with T -1.5 as lowest score  (b) repeat bone density at [facility] 02/11/2015 shows a T score of -2.7   PLAN: Hally is now a little over 5 years out from her local recurrence to the chest wall.  There is no evidence of disease activity.  She has completed 5 years of anastrozole.  At this point I am comfortable discontinuing that medication and releasing her to her primary care physician.  All she will need in terms of breast cancer follow-up is her yearly mammography on the left, which she usually obtains through [facility] in December, and MD exam of the right chest wall yearly  I will be glad to see her again at any point in the future if and when the need arises but as of now are making no further routine appointment for her here.  Debra Lynn, Virgie Dad, MD  02/02/18 6:47 PM Medical Oncology and Hematology [facility] [address] [place], [place] [address] Tel. [phone]    Fax. [phone]  I,  Wilburn Mylar am acting as scribe for Dr. Virgie Dad. Candiss Galeana.  I, Lurline Del MD, have reviewed the above documentation for accuracy and completeness, and I agree with the above.

## 2018-02-03 ENCOUNTER — Telehealth: Payer: Self-pay | Admitting: Oncology

## 2018-02-03 NOTE — Telephone Encounter (Signed)
No los °

## 2018-02-05 LAB — CUP PACEART REMOTE DEVICE CHECK
Battery Remaining Longevity: 64 mo
Battery Remaining Percentage: 95.5 %
Battery Voltage: 2.96 V
Brady Statistic AP VP Percent: 24 %
Brady Statistic AP VS Percent: 1 %
Brady Statistic AS VP Percent: 76 %
Brady Statistic AS VS Percent: 1 %
Brady Statistic RA Percent Paced: 22 %
Date Time Interrogation Session: 20191104060015
Implantable Lead Implant Date: 20081223
Implantable Lead Implant Date: 20081223
Implantable Lead Implant Date: 20081223
Implantable Lead Location: 753858
Implantable Lead Location: 753859
Implantable Lead Location: 753860
Implantable Lead Model: 5024
Implantable Lead Model: 5524
Implantable Pulse Generator Implant Date: 20160526
Lead Channel Impedance Value: 1000 Ohm
Lead Channel Impedance Value: 1050 Ohm
Lead Channel Impedance Value: 540 Ohm
Lead Channel Pacing Threshold Amplitude: 1.625 V
Lead Channel Pacing Threshold Amplitude: 1.875 V
Lead Channel Pacing Threshold Amplitude: 2.75 V
Lead Channel Pacing Threshold Pulse Width: 0.5 ms
Lead Channel Pacing Threshold Pulse Width: 0.7 ms
Lead Channel Pacing Threshold Pulse Width: 0.8 ms
Lead Channel Sensing Intrinsic Amplitude: 2.5 mV
Lead Channel Sensing Intrinsic Amplitude: 8.1 mV
Lead Channel Setting Pacing Amplitude: 2.625
Lead Channel Setting Pacing Amplitude: 3.375
Lead Channel Setting Pacing Amplitude: 3.75 V
Lead Channel Setting Pacing Pulse Width: 0.7 ms
Lead Channel Setting Pacing Pulse Width: 0.8 ms
Lead Channel Setting Sensing Sensitivity: 4.5 mV
Pulse Gen Model: 3222
Pulse Gen Serial Number: 7738731

## 2018-02-13 ENCOUNTER — Other Ambulatory Visit: Payer: Self-pay | Admitting: Interventional Cardiology

## 2018-02-17 ENCOUNTER — Telehealth: Payer: Self-pay

## 2018-02-17 NOTE — Telephone Encounter (Signed)
Returned call to daughter, [name] as requested by voice mail.  She reported patient told her she passed out last night in the bathroom around 9 pm.  She thinks it may be related to her heart and would like a remote transmission reviewed. She said patient is feeling fine this morning.   Advised to send a remote transmission for review.

## 2018-02-17 NOTE — Telephone Encounter (Signed)
Spoke with [name] and advised that Dr. Rayann Heman does not have any new recommendations. Cindy verbalizes understanding, states pt continually refused PCP and ED f/u today. [name] reports that she has been "piecing together the story" and pt may not have blacked out or fallen, may have "worked herself up about this." She is monitoring the pt at home and she is aware to have pt seek emergency medical attention for any mental status changes, or any new/worsening symptoms. She thanked me for my call and denies questions at this time.

## 2018-02-17 NOTE — Telephone Encounter (Signed)
Dr. Rayann Heman aware.  No further action needed.

## 2018-02-17 NOTE — Telephone Encounter (Signed)
Spoke with patient's daughter, [name]. Pt's "black out" episode occurred around 9pm (reported by pt). Pt reportedly blacked out, fell against the tub faucet, and landed/sat on the edge of the tub. Pt had been standing at the sink with a bottle of pills. Husband was around the corner and heard the commotion. Pills were all over the floor. Per [name], there is not likely a way pt could've hit her head. BP today was 152/74. They didn't check her BP last night, just told [name] about episode today.  Reviewed manual transmission from 02/17/18 at 1000. Presenting rhythm shows As/BiVp at ~90bpm. RV/LV lead trends stable, RA threshold trend suggests some variability. Ap 21%, BiVp >99%. No recent HVRs. Most recent AMS episode on 02/13/18, duration 6sec, 2.4% AT/AF burden since counters last cleared on 10/18/16, +warfarin.  Maudry Mayhew that any time there is an unwitnessed fall or syncopal episode, our recommendation is to contact PCP or seek emergency medical attention to assess for injury. Cindy verbalizes understanding, but still requests review/recommendations from Dr. Rayann Heman. Advised I will make Dr. Rayann Heman and Sonia Baller, RN, aware via message. She denies additional questions or concerns at this time.

## 2018-02-17 NOTE — Telephone Encounter (Signed)
Call to daughter and advised transmission received.  Advised would forward call note to device clinic triage regarding patient passing out around 9 pm last night (02/16/2018).  Patient did not hit her head but did hit her side against a faucett but no bruising noted.  Advised triage nurse will call her after reviewing the transmission.

## 2018-03-06 ENCOUNTER — Ambulatory Visit (INDEPENDENT_AMBULATORY_CARE_PROVIDER_SITE_OTHER): Payer: Medicare Other

## 2018-03-06 DIAGNOSIS — I442 Atrioventricular block, complete: Secondary | ICD-10-CM

## 2018-03-06 DIAGNOSIS — I4891 Unspecified atrial fibrillation: Secondary | ICD-10-CM | POA: Diagnosis not present

## 2018-03-07 ENCOUNTER — Ambulatory Visit: Payer: Medicare Other | Admitting: *Deleted

## 2018-03-07 DIAGNOSIS — Z5181 Encounter for therapeutic drug level monitoring: Secondary | ICD-10-CM

## 2018-03-07 DIAGNOSIS — I4891 Unspecified atrial fibrillation: Secondary | ICD-10-CM | POA: Diagnosis not present

## 2018-03-07 LAB — POCT INR: INR: 1.8 — AB (ref 2.0–3.0)

## 2018-03-07 NOTE — Patient Instructions (Signed)
Description   Take 1 tablets today, then resume same dosage 1 tablet daily except 1/2 tablet on Tuesdays, Thursdays, and Saturdays.  Recheck INR in 4 weeks.

## 2018-03-09 LAB — CUP PACEART REMOTE DEVICE CHECK
Battery Remaining Longevity: 65 mo
Battery Remaining Percentage: 89 %
Battery Voltage: 2.95 V
Brady Statistic AP VP Percent: 23 %
Brady Statistic AP VS Percent: 1 %
Brady Statistic AS VP Percent: 77 %
Brady Statistic AS VS Percent: 1 %
Brady Statistic RA Percent Paced: 21 %
Date Time Interrogation Session: 20200203131059
Implantable Lead Implant Date: 20081223
Implantable Lead Implant Date: 20081223
Implantable Lead Implant Date: 20081223
Implantable Lead Location: 753858
Implantable Lead Location: 753859
Implantable Lead Location: 753860
Implantable Lead Model: 5024
Implantable Lead Model: 5524
Implantable Pulse Generator Implant Date: 20160526
Lead Channel Impedance Value: 1025 Ohm
Lead Channel Impedance Value: 510 Ohm
Lead Channel Impedance Value: 990 Ohm
Lead Channel Pacing Threshold Amplitude: 1.5 V
Lead Channel Pacing Threshold Amplitude: 1.625 V
Lead Channel Pacing Threshold Amplitude: 2.25 V
Lead Channel Pacing Threshold Pulse Width: 0.5 ms
Lead Channel Pacing Threshold Pulse Width: 0.7 ms
Lead Channel Pacing Threshold Pulse Width: 0.8 ms
Lead Channel Sensing Intrinsic Amplitude: 3.1 mV
Lead Channel Sensing Intrinsic Amplitude: 8.1 mV
Lead Channel Setting Pacing Amplitude: 2.5 V
Lead Channel Setting Pacing Amplitude: 2.625
Lead Channel Setting Pacing Amplitude: 3.25 V
Lead Channel Setting Pacing Pulse Width: 0.7 ms
Lead Channel Setting Pacing Pulse Width: 0.8 ms
Lead Channel Setting Sensing Sensitivity: 4.5 mV
Pulse Gen Model: 3222
Pulse Gen Serial Number: 7738731

## 2018-03-14 NOTE — Progress Notes (Signed)
Remote pacemaker transmission.   

## 2018-04-06 ENCOUNTER — Ambulatory Visit: Payer: Medicare Other | Admitting: Pharmacist

## 2018-04-06 DIAGNOSIS — I4891 Unspecified atrial fibrillation: Secondary | ICD-10-CM

## 2018-04-06 DIAGNOSIS — Z5181 Encounter for therapeutic drug level monitoring: Secondary | ICD-10-CM | POA: Diagnosis not present

## 2018-04-06 LAB — POCT INR: INR: 2.9 (ref 2.0–3.0)

## 2018-04-06 NOTE — Patient Instructions (Signed)
Take 1 tablets today, then resume same dosage 1 tablet daily except 1/2 tablet on Tuesdays, Thursdays, and Saturdays.  Recheck INR in 4 weeks.

## 2018-05-10 ENCOUNTER — Telehealth: Payer: Self-pay

## 2018-05-10 NOTE — Telephone Encounter (Signed)

## 2018-05-11 ENCOUNTER — Other Ambulatory Visit: Payer: Self-pay

## 2018-05-11 ENCOUNTER — Ambulatory Visit (INDEPENDENT_AMBULATORY_CARE_PROVIDER_SITE_OTHER): Payer: Medicare Other | Admitting: Pharmacist

## 2018-05-11 DIAGNOSIS — I4821 Permanent atrial fibrillation: Secondary | ICD-10-CM

## 2018-05-11 DIAGNOSIS — Z5181 Encounter for therapeutic drug level monitoring: Secondary | ICD-10-CM

## 2018-05-11 DIAGNOSIS — I4891 Unspecified atrial fibrillation: Secondary | ICD-10-CM

## 2018-05-11 LAB — POCT INR: INR: 2.2 (ref 2.0–3.0)

## 2018-06-12 ENCOUNTER — Other Ambulatory Visit: Payer: Self-pay

## 2018-06-12 ENCOUNTER — Ambulatory Visit (INDEPENDENT_AMBULATORY_CARE_PROVIDER_SITE_OTHER): Payer: Medicare Other | Admitting: *Deleted

## 2018-06-12 ENCOUNTER — Telehealth: Payer: Self-pay

## 2018-06-12 DIAGNOSIS — I4891 Unspecified atrial fibrillation: Secondary | ICD-10-CM | POA: Diagnosis not present

## 2018-06-12 DIAGNOSIS — I442 Atrioventricular block, complete: Secondary | ICD-10-CM

## 2018-06-12 NOTE — Telephone Encounter (Signed)
Left message for patient to remind of missed remote transmission.  

## 2018-06-13 LAB — CUP PACEART REMOTE DEVICE CHECK
Date Time Interrogation Session: 20200512105337
Implantable Lead Implant Date: 20081223
Implantable Lead Implant Date: 20081223
Implantable Lead Implant Date: 20081223
Implantable Lead Location: 753858
Implantable Lead Location: 753859
Implantable Lead Location: 753860
Implantable Lead Model: 5024
Implantable Lead Model: 5524
Implantable Pulse Generator Implant Date: 20160526
Pulse Gen Model: 3222
Pulse Gen Serial Number: 7738731

## 2018-06-15 IMAGING — DX DG CHEST 2V
2 series · 2 of 2 positions shown · non-contrast
Comparison: 10/15/2015

CLINICAL DATA: Flu like symptoms nonproductive cough

EXAM:
CHEST  2 VIEW

[chest lat]
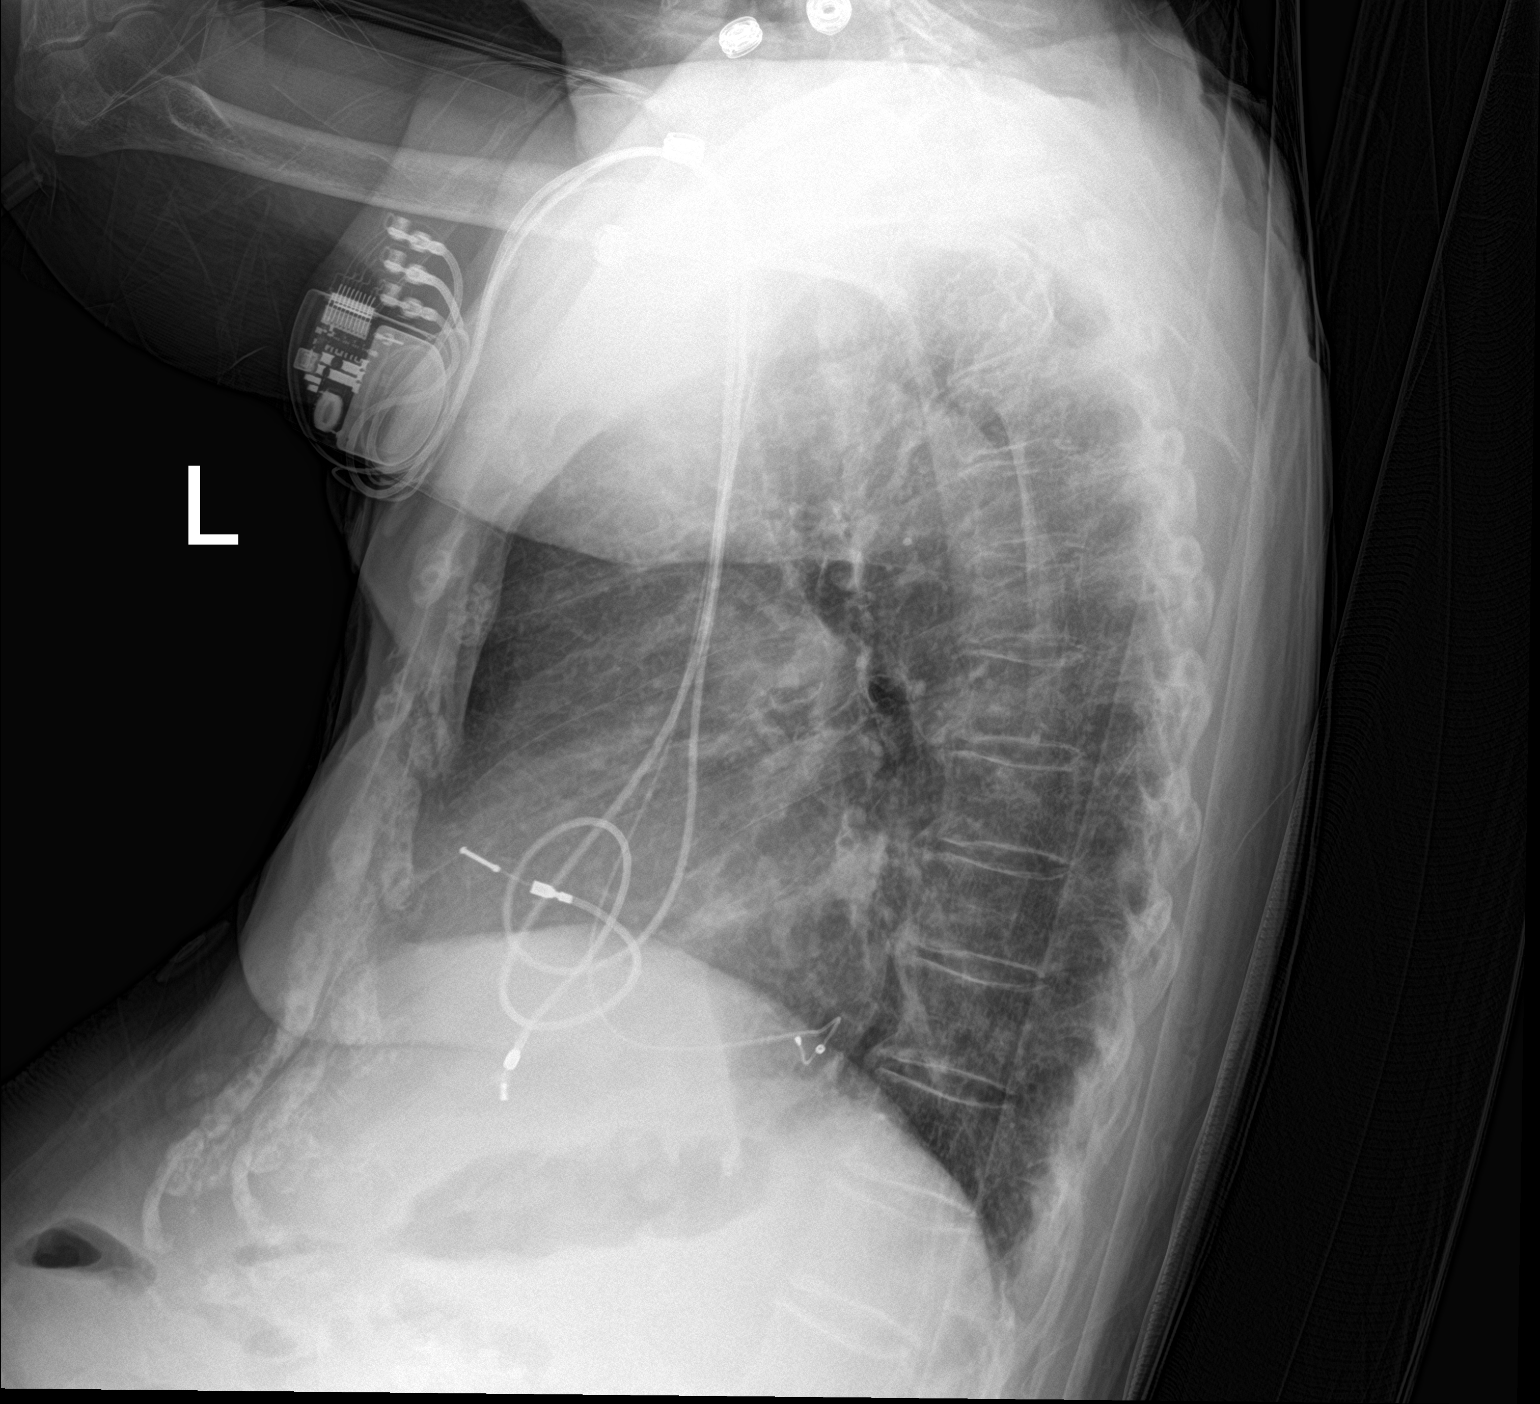

[chest ap]
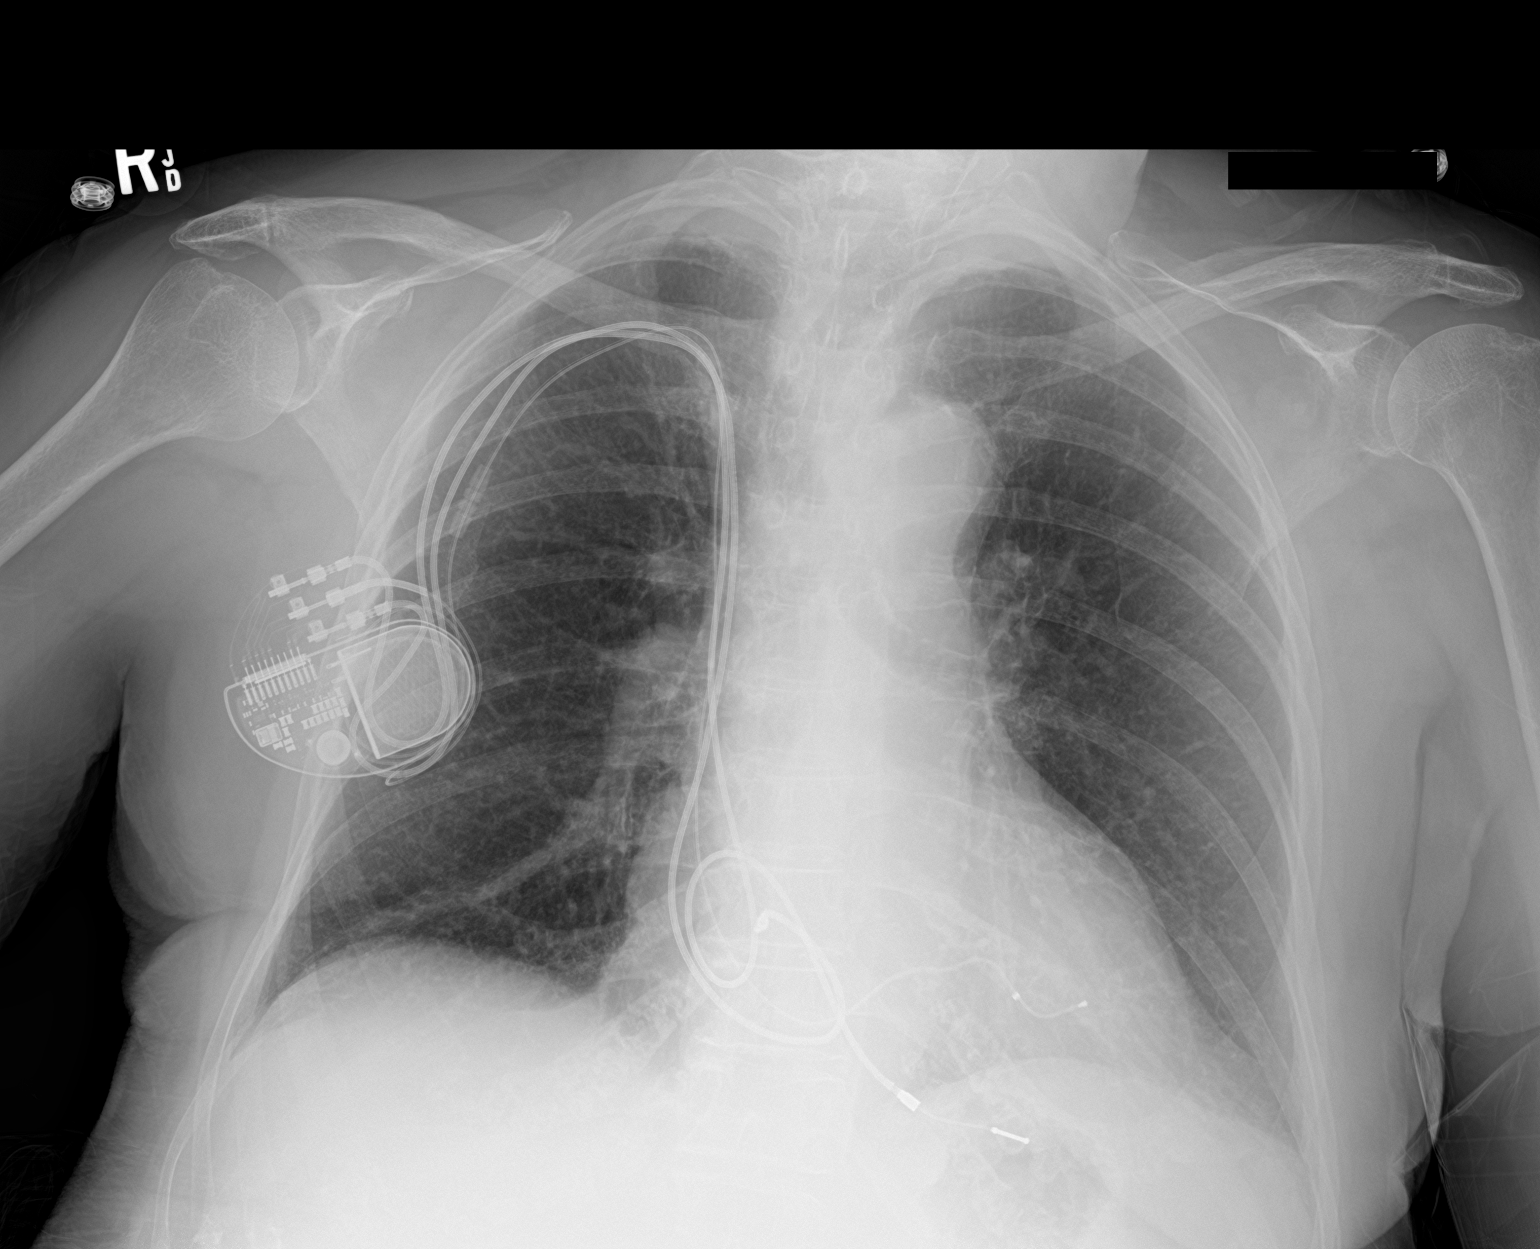

[2 of 2 positions shown; findings below may reference images not displayed]

FINDINGS: Multi lead right-sided pacing device similar compared to prior. No
focal consolidation or pleural effusion. Stable cardiomediastinal
silhouette with prominent left pericardial fat pad. Atherosclerosis
of the aorta. No pneumothorax.
IMPRESSION: No active cardiopulmonary disease.

## 2018-06-19 ENCOUNTER — Other Ambulatory Visit: Payer: Self-pay | Admitting: Interventional Cardiology

## 2018-06-21 ENCOUNTER — Other Ambulatory Visit: Payer: Self-pay | Admitting: Interventional Cardiology

## 2018-06-21 MED ORDER — FUROSEMIDE 40 MG PO TABS: 40.0000 mg | ORAL_TABLET | Freq: Every morning | ORAL | 1 refills | Status: DC

## 2018-06-21 MED ORDER — PROPRANOLOL HCL 60 MG PO TABS: 30.0000 mg | ORAL_TABLET | Freq: Two times a day (BID) | ORAL | 1 refills | Status: DC

## 2018-06-22 NOTE — Progress Notes (Signed)
Remote pacemaker transmission.   

## 2018-07-03 ENCOUNTER — Telehealth: Payer: Self-pay

## 2018-07-03 NOTE — Telephone Encounter (Signed)

## 2018-07-05 ENCOUNTER — Other Ambulatory Visit: Payer: Self-pay

## 2018-07-05 ENCOUNTER — Ambulatory Visit (INDEPENDENT_AMBULATORY_CARE_PROVIDER_SITE_OTHER): Payer: Medicare Other | Admitting: Pharmacist

## 2018-07-05 DIAGNOSIS — Z5181 Encounter for therapeutic drug level monitoring: Secondary | ICD-10-CM

## 2018-07-05 DIAGNOSIS — I4891 Unspecified atrial fibrillation: Secondary | ICD-10-CM | POA: Diagnosis not present

## 2018-07-05 LAB — POCT INR: INR: 2.5 (ref 2.0–3.0)

## 2018-07-05 NOTE — Patient Instructions (Signed)
Description   Continue taking 1 tablet daily except 1/2 tablet on Tuesdays, Thursdays, and Saturdays.  Recheck INR in 8 weeks.

## 2018-08-29 ENCOUNTER — Telehealth: Payer: Self-pay | Admitting: Pharmacist

## 2018-08-29 NOTE — Telephone Encounter (Signed)

## 2018-08-31 ENCOUNTER — Ambulatory Visit (INDEPENDENT_AMBULATORY_CARE_PROVIDER_SITE_OTHER): Payer: Medicare Other | Admitting: *Deleted

## 2018-08-31 ENCOUNTER — Other Ambulatory Visit: Payer: Self-pay

## 2018-08-31 DIAGNOSIS — I4891 Unspecified atrial fibrillation: Secondary | ICD-10-CM

## 2018-08-31 DIAGNOSIS — Z5181 Encounter for therapeutic drug level monitoring: Secondary | ICD-10-CM | POA: Diagnosis not present

## 2018-08-31 LAB — POCT INR: INR: 1.8 — AB (ref 2.0–3.0)

## 2018-08-31 NOTE — Patient Instructions (Signed)
Description   Today take 1 tablet then continue taking 1 tablet daily except 1/2 tablet on Tuesdays, Thursdays, and Saturdays.  Recheck INR in 6 weeks.

## 2018-09-11 ENCOUNTER — Ambulatory Visit (INDEPENDENT_AMBULATORY_CARE_PROVIDER_SITE_OTHER): Payer: Medicare Other | Admitting: *Deleted

## 2018-09-11 DIAGNOSIS — I442 Atrioventricular block, complete: Secondary | ICD-10-CM | POA: Diagnosis not present

## 2018-09-12 LAB — CUP PACEART REMOTE DEVICE CHECK
Battery Remaining Longevity: 61 mo
Battery Remaining Percentage: 80 %
Battery Voltage: 2.93 V
Brady Statistic AP VP Percent: 23 %
Brady Statistic AP VS Percent: 1 %
Brady Statistic AS VP Percent: 77 %
Brady Statistic AS VS Percent: 1 %
Brady Statistic RA Percent Paced: 22 %
Date Time Interrogation Session: 20200811093141
Implantable Lead Implant Date: 20081223
Implantable Lead Implant Date: 20081223
Implantable Lead Implant Date: 20081223
Implantable Lead Location: 753858
Implantable Lead Location: 753859
Implantable Lead Location: 753860
Implantable Lead Model: 5024
Implantable Lead Model: 5524
Implantable Pulse Generator Implant Date: 20160526
Lead Channel Impedance Value: 1050 Ohm
Lead Channel Impedance Value: 1075 Ohm
Lead Channel Impedance Value: 550 Ohm
Lead Channel Pacing Threshold Amplitude: 1.625 V
Lead Channel Pacing Threshold Amplitude: 2.125 V
Lead Channel Pacing Threshold Amplitude: 2.25 V
Lead Channel Pacing Threshold Pulse Width: 0.5 ms
Lead Channel Pacing Threshold Pulse Width: 0.7 ms
Lead Channel Pacing Threshold Pulse Width: 0.8 ms
Lead Channel Sensing Intrinsic Amplitude: 10.2 mV
Lead Channel Sensing Intrinsic Amplitude: 3.4 mV
Lead Channel Setting Pacing Amplitude: 2.625
Lead Channel Setting Pacing Amplitude: 3.125
Lead Channel Setting Pacing Amplitude: 3.75 V
Lead Channel Setting Pacing Pulse Width: 0.7 ms
Lead Channel Setting Pacing Pulse Width: 0.8 ms
Lead Channel Setting Sensing Sensitivity: 4.5 mV
Pulse Gen Model: 3222
Pulse Gen Serial Number: 7738731

## 2018-09-20 ENCOUNTER — Encounter: Payer: Self-pay | Admitting: Cardiology

## 2018-09-20 NOTE — Progress Notes (Signed)
Remote pacemaker transmission.   

## 2018-09-28 ENCOUNTER — Other Ambulatory Visit: Payer: Self-pay | Admitting: Internal Medicine

## 2018-10-12 ENCOUNTER — Other Ambulatory Visit: Payer: Self-pay

## 2018-10-12 ENCOUNTER — Ambulatory Visit (INDEPENDENT_AMBULATORY_CARE_PROVIDER_SITE_OTHER): Payer: Medicare Other | Admitting: *Deleted

## 2018-10-12 DIAGNOSIS — Z5181 Encounter for therapeutic drug level monitoring: Secondary | ICD-10-CM

## 2018-10-12 DIAGNOSIS — I4891 Unspecified atrial fibrillation: Secondary | ICD-10-CM

## 2018-10-12 LAB — POCT INR: INR: 2.6 (ref 2.0–3.0)

## 2018-10-12 NOTE — Patient Instructions (Signed)
Description   Continue taking 1 tablet daily except 1/2 tablet on Tuesdays,  Thursdays, and Saturdays. Recheck INR in 6 weeks.      

## 2018-10-17 ENCOUNTER — Encounter: Payer: Medicare Other | Admitting: Nurse Practitioner

## 2018-10-27 ENCOUNTER — Ambulatory Visit (INDEPENDENT_AMBULATORY_CARE_PROVIDER_SITE_OTHER): Payer: Medicare Other | Admitting: Student

## 2018-10-27 ENCOUNTER — Other Ambulatory Visit: Payer: Self-pay

## 2018-10-27 VITALS — BP 124/70 | HR 78 | Ht 63.0 in | Wt 148.0 lb

## 2018-10-27 DIAGNOSIS — Z5181 Encounter for therapeutic drug level monitoring: Secondary | ICD-10-CM | POA: Diagnosis not present

## 2018-10-27 DIAGNOSIS — I442 Atrioventricular block, complete: Secondary | ICD-10-CM | POA: Diagnosis not present

## 2018-10-27 DIAGNOSIS — I5022 Chronic systolic (congestive) heart failure: Secondary | ICD-10-CM

## 2018-10-27 DIAGNOSIS — I4891 Unspecified atrial fibrillation: Secondary | ICD-10-CM

## 2018-10-27 LAB — CUP PACEART INCLINIC DEVICE CHECK
Battery Remaining Longevity: 51 mo
Battery Voltage: 2.93 V
Brady Statistic RA Percent Paced: 21 %
Brady Statistic RV Percent Paced: 99.77 %
Date Time Interrogation Session: 20200925161255
Implantable Lead Implant Date: 20081223
Implantable Lead Implant Date: 20081223
Implantable Lead Implant Date: 20081223
Implantable Lead Location: 753858
Implantable Lead Location: 753859
Implantable Lead Location: 753860
Implantable Lead Model: 5024
Implantable Lead Model: 5524
Implantable Pulse Generator Implant Date: 20160526
Lead Channel Impedance Value: 1162.5 Ohm
Lead Channel Impedance Value: 1237.5 Ohm
Lead Channel Impedance Value: 550 Ohm
Lead Channel Pacing Threshold Amplitude: 0.625 V
Lead Channel Pacing Threshold Amplitude: 1.75 V
Lead Channel Pacing Threshold Amplitude: 1.75 V
Lead Channel Pacing Threshold Amplitude: 2.125 V
Lead Channel Pacing Threshold Pulse Width: 0.5 ms
Lead Channel Pacing Threshold Pulse Width: 0.7 ms
Lead Channel Pacing Threshold Pulse Width: 0.7 ms
Lead Channel Pacing Threshold Pulse Width: 0.8 ms
Lead Channel Sensing Intrinsic Amplitude: 3.8 mV
Lead Channel Sensing Intrinsic Amplitude: 7.2 mV
Lead Channel Setting Pacing Amplitude: 1.625
Lead Channel Setting Pacing Amplitude: 3.125
Lead Channel Setting Pacing Amplitude: 3.5 V
Lead Channel Setting Pacing Pulse Width: 0.7 ms
Lead Channel Setting Pacing Pulse Width: 0.8 ms
Lead Channel Setting Sensing Sensitivity: 4.5 mV
Pulse Gen Model: 3222
Pulse Gen Serial Number: 7738731

## 2018-10-27 NOTE — Patient Instructions (Addendum)
Medication Instructions:  Your physician recommends that you continue on your current medications as directed. Please refer to the Current Medication list given to you today.  If you need a refill on your cardiac medications before your next appointment, please call your pharmacy.   Lab work: NONE ORDERED  TODAY  If you have labs (blood work) drawn today and your tests are completely normal, you will receive your results only by: Marland Kitchen MyChart Message (if you have MyChart) OR . A paper copy in the mail If you have any lab test that is abnormal or we need to change your treatment, we will call you to review the results.  Testing/Procedures: Your physician has requested that you have an echocardiogram. Echocardiography is a painless test that uses sound waves to create images of your heart. It provides your doctor with information about the size and shape of your heart and how well your heart's chambers and valves are working. This procedure takes approximately one hour. There are no restrictions for this procedure.  Follow-Up: At [facility], you and your health needs are our priority.  As part of our continuing mission to provide you with exceptional heart care, we have created designated Provider Care Teams.  These Care Teams include your primary Cardiologist (physician) and Advanced Practice Providers (APPs -  Physician Assistants and Nurse Practitioners) who all work together to provide you with the care you need, when you need it. You will need a follow up appointment in 6 months.  Please call our office 2 months in advance to schedule this appointment.  You may see Thompson Grayer, MD  or one of the following Advanced Practice Providers on your designated Care Team:   Chanetta Marshall, NP . Tommye Standard, PA-C . Joesph July PA-C   Any Other Special Instructions Will Be Listed Below (If Applicable).

## 2018-10-27 NOTE — Progress Notes (Signed)
Electrophysiology Office Note Date: 10/27/2018  ID:  Debra Lynn, DOB 01-03-1929, MRN [id number]  PCP: Kathyrn Lass, MD Primary Cardiologist: No primary care provider on file. Electrophysiologist: Dr. Rayann Heman  CC: Pacemaker follow-up  Debra Lynn is a 85 y.o. female seen today for Dr. Rayann Heman. she presents today for routine electrophysiology followup.  Since last being seen in our clinic, the patient reports doing OK overall. She remains fatigued, but this is chronic for her. She denies any new symptoms. She denies chest pain, palpitations, dyspnea, PND, orthopnea, nausea, vomiting, dizziness, syncope, edema, weight gain, or early satiety.  Device History: St. Jude dual chamber PPM implanted 1992 for CHB; gen change 2004, update to STJ CRTP 2008; gen change 2016  Past Medical History:  Diagnosis Date  . Acute ischemic stroke ([place]) 08/23/2012  . Acute, but ill-defined, cerebrovascular disease 11/08/2012  . Arthritis    . Atrial fibrillation ([place])    a. s/p AVN ablation  . Cancer [facility]) 12/2010   breast with recurrence s/p recent XRT  . Complete heart block ([facility])    a. s/p STJ CRTP  . Compression fracture        . CVA 05/07/2008      . Fibrocystic breast   . GERD (gastroesophageal reflux disease)   . Glaucoma   . Hyperlipidemia   . Hypertension   . Orthostatic hypotension   . Overactive bladder   . Parkinsonism ([place])   . Pericarditis       . 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 Seasonal allergic rhinitis    Pos Skin Test 07-05-07  . Seizures ([place]) 2013      . Thoracic aneurysm without mention of rupture    Past Surgical History:  Procedure Laterality Date  . ABDOMINAL HYSTERECTOMY  1975   pt. unsure if laparascopic/vaginal/abdominal  . ABLATION  1980s   AVN ablation   . APPENDECTOMY    . BI-VENTRICULAR PACEMAKER UPGRADE  01/2007   3rd device, upgraded to SJM Frontier II BiV pacemaker by Dr Leonia Reeves  . BREAST SURGERY Right 01/15/11   mastectomy  . CATARACT EXTRACTION Bilateral   . CHOLECYSTECTOMY    . EP IMPLANTABLE DEVICE N/A 06/27/2014   CRT-P  (SJM) gen change by Dr Rayann Heman  . EYE SURGERY     For glaucoma  . FLEXIBLE BRONCHOSCOPY W/ UPPER ENDOSCOPY     and swallowing evaluation  . KNEE ARTHROSCOPY Right    Arthroscopic for torn meniscus  . MASS EXCISION Right 01/19/2013   Procedure: EXCISION CHEST WALL MASS;  Surgeon: [name], MD;  Location: [facility];  Service: General;  Laterality: Right;  . PACEMAKER INSERTION     1992; gen change 2004  . VESICOVAGINAL FISTULA CLOSURE W/ TAH      Current Outpatient Medications  Medication Sig Dispense Refill  . acetaminophen (TYLENOL) 500 MG tablet Take 1,000 mg by mouth every 6 (six) hours as needed for moderate pain (pain).     . benzonatate (TESSALON) 100 MG capsule TAKE 1 CAPSULE BY MOUTH 3 TIMES A DAY AS NEEDED FOR COUGH 90 capsule 3  . Calcium Carbonate-Vitamin D (CALCIUM-VITAMIN D) 500-200 MG-UNIT per tablet Take 1 tablet by mouth daily.     . cephALEXin (KEFLEX) 250 MG capsule Take 250 mg by mouth at bedtime.    . colestipol (COLESTID) 1 G tablet Take 1 g by mouth at bedtime.    . furosemide (LASIX) 40 MG tablet Take 1 tablet (40 mg  total) by mouth every morning. 90 tablet 1  . levETIRAcetam (KEPPRA) 250 MG tablet Take 250 mg by mouth 2 (two) times daily.    . Multiple Vitamin (MULTIVITAMIN WITH MINERALS) TABS tablet Take 1 tablet by mouth daily.    . pantoprazole (PROTONIX) 40 MG tablet Take 1 tablet by mouth daily.    . potassium chloride SA (K-DUR,KLOR-CON) 20 MEQ tablet TAKE 1 TABLET BY MOUTH  DAILY 90 tablet 3  . pravastatin (PRAVACHOL) 80 MG tablet TAKE 1 TABLET BY MOUTH  DAILY 90 tablet 3  . Probiotic Product (ALIGN) 4 MG CAPS Take 4 mg by mouth daily. 30 capsule 0  . propranolol (INDERAL) 60 MG tablet Take 0.5 tablets (30 mg total) by mouth 2 (two) times daily. 90 tablet 1  . warfarin (COUMADIN) 2.5 MG tablet TAKE BY MOUTH AS DIRECTED  BY COUMADIN  CLINIC 90 tablet 1  . anastrozole (ARIMIDEX) 1 MG tablet TAKE 1 TABLET BY MOUTH  DAILY 90 tablet 3   No current facility-administered medications for this visit.     Allergies:   Patient has no known allergies.   Social History: Social History   Socioeconomic History  . Marital status: Married    Spouse name: Not on file  . Number of children: 2  . Years of education: Not on file  . Highest education level: Not on file  Occupational History  . Occupation: retired Solicitor Needs  . Financial resource strain: Not on file  . Food insecurity    Worry: Not on file    Inability: Not on file  . Transportation needs    Medical: Not on file    Non-medical: Not on file  Tobacco Use  . Smoking status: Never Smoker  . Smokeless tobacco: Never Used  Substance and Sexual Activity  . Alcohol use: No  . Drug use: No  . Sexual activity: Not on file  Lifestyle  . Physical activity    Days per week: Not on file    Minutes per session: Not on file  . Stress: Not on file  Relationships  . Social Herbalist on phone: Not on file    Gets together: Not on file    Attends religious service: Not on file    Active member of club or organization: Not on file    Attends meetings of clubs or organizations: Not on file    Relationship status: Not on file  . Intimate partner violence    Fear of current or ex partner: Not on file    Emotionally abused: Not on file    Physically abused: Not on file    Forced sexual activity: Not on file  Other Topics Concern  . Not on file  Social History Narrative  . Not on file    Family History: Family History  Problem Relation Age of Onset  . Heart failure Mother   . Cancer Father        brain  . Asthma Brother   . Asthma Daughter   . Heart disease Unknown   . Cancer Unknown   . Lymphoma Brother   . Diabetes Brother   . Hypertension Brother   . Heart attack Brother      Review of Systems: All other systems reviewed  and are otherwise negative except as noted above.  Physical Exam: Vitals:   10/27/18 1300  BP: 124/70  Pulse: 78  Weight: 148 lb (67.1 kg)  Height: 5\' 3"  (  1.6 m)     GEN- The patient is well appearing, alert and oriented x 3 today.   HEENT: normocephalic, atraumatic; sclera clear, conjunctiva pink; hearing intact; oropharynx clear; neck supple  Lungs- Clear to ausculation bilaterally, normal work of breathing.  No wheezes, rales, rhonchi Heart- Regular rate and rhythm, no murmurs, rubs or gallops  GI- soft, non-tender, non-distended, bowel sounds present  Extremities- no clubbing, cyanosis, or edema  MS- no significant deformity or atrophy Skin- warm and dry, no rash or lesion; PPM pocket well healed Psych- euthymic mood, full affect Neuro- strength and sensation are intact  PPM Interrogation- reviewed in detail today,  See PACEART report  EKG:  EKG is ordered today. The ekg ordered today shows V paced at 73 bpm, personally reviewed  Recent Labs: 02/02/2018: ALT 19; BUN 17; Creatinine, Ser 0.87; Hemoglobin 12.3; Platelets 182; Potassium 3.9; Sodium 141   Wt Readings from Last 3 Encounters:  10/27/18 148 lb (67.1 kg)  02/02/18 143 lb 8 oz (65.1 kg)  12/20/17 141 lb (64 kg)     Other studies Reviewed: Additional studies/ records that were reviewed today include: Echo 10/2015 shows LVEF 60-65%, Previous EP office notes, Previous remote checks, Most recent labwork.   Assessment and Plan:  1.  CHB s/p St. Jude PPM  Normal PPM function See Pace Art report No changes today Pt has chronically elevated RV threshold ~ 1.5-1.75 V @ 0.7 ms. Unfortunately, Cap Confirm was note recommended today. Programmed at 3.5 V @ 0.7 ms for 2x safety margin.   2. Chronic systolic CHF EF normalized post CRT. Most recent echo 10/2015 showed normal EF 60-65% Will update Echo. Can likely then only repeat as needed. Follow[facility] in [facility]  3. HTN Stable on current regiment.   4. Atrial  fibrillation/A flutter She is on coumadin.  At last visit, 10/2017, was pace terminated.  Burden <1% by device interrogation  Current medicines are reviewed at length with the patient today.   The patient does not have concerns regarding her medicines.  The following changes were made today:  none  Labs/ tests ordered today include:  Orders Placed This Encounter  Procedures  . EKG 12-Lead  . ECHOCARDIOGRAM COMPLETE   Disposition:   Follow up with EP APP in 6 months, sooner with symptoms.   Jacalyn Lefevre, PA-C  10/27/2018 4:11 PM  [place] [address] [phone] (office) [phone] (fax)

## 2018-11-01 ENCOUNTER — Encounter: Payer: Medicare Other | Admitting: Nurse Practitioner

## 2018-11-06 ENCOUNTER — Other Ambulatory Visit: Payer: Self-pay | Admitting: Interventional Cardiology

## 2018-11-08 ENCOUNTER — Other Ambulatory Visit (HOSPITAL_COMMUNITY): Payer: Medicare Other

## 2018-11-23 ENCOUNTER — Other Ambulatory Visit: Payer: Self-pay

## 2018-11-23 ENCOUNTER — Other Ambulatory Visit (HOSPITAL_COMMUNITY): Payer: Medicare Other

## 2018-11-23 ENCOUNTER — Ambulatory Visit: Payer: Medicare Other | Admitting: *Deleted

## 2018-11-23 DIAGNOSIS — Z5181 Encounter for therapeutic drug level monitoring: Secondary | ICD-10-CM

## 2018-11-23 DIAGNOSIS — I4891 Unspecified atrial fibrillation: Secondary | ICD-10-CM

## 2018-11-23 LAB — POCT INR: INR: 2.1 (ref 2.0–3.0)

## 2018-11-23 NOTE — Patient Instructions (Addendum)
Description   Take 1 tablet tonight because last nights dose was missed, then Continue taking 1 tablet daily except 1/2 tablet on Tuesdays, Thursdays, and Saturdays.  Recheck INR in 6 weeks. Call coumadin clinic [phone] with any changes in medications or upcoming procedures

## 2018-11-29 ENCOUNTER — Ambulatory Visit (HOSPITAL_COMMUNITY): Payer: Medicare Other | Attending: Cardiovascular Disease

## 2018-11-29 ENCOUNTER — Other Ambulatory Visit: Payer: Self-pay

## 2018-11-29 DIAGNOSIS — I5022 Chronic systolic (congestive) heart failure: Secondary | ICD-10-CM | POA: Diagnosis present

## 2018-12-11 ENCOUNTER — Ambulatory Visit (INDEPENDENT_AMBULATORY_CARE_PROVIDER_SITE_OTHER): Payer: Medicare Other | Admitting: *Deleted

## 2018-12-11 DIAGNOSIS — I4821 Permanent atrial fibrillation: Secondary | ICD-10-CM | POA: Diagnosis not present

## 2018-12-11 DIAGNOSIS — I442 Atrioventricular block, complete: Secondary | ICD-10-CM

## 2018-12-12 LAB — CUP PACEART REMOTE DEVICE CHECK
Battery Remaining Longevity: 44 mo
Battery Remaining Percentage: 71 %
Battery Voltage: 2.92 V
Brady Statistic AP VP Percent: 26 %
Brady Statistic AP VS Percent: 1 %
Brady Statistic AS VP Percent: 74 %
Brady Statistic AS VS Percent: 1 %
Brady Statistic RA Percent Paced: 25 %
Date Time Interrogation Session: 20201109070012
Implantable Lead Implant Date: 20081223
Implantable Lead Implant Date: 20081223
Implantable Lead Implant Date: 20081223
Implantable Lead Location: 753858
Implantable Lead Location: 753859
Implantable Lead Location: 753860
Implantable Lead Model: 5024
Implantable Lead Model: 5524
Implantable Pulse Generator Implant Date: 20160526
Lead Channel Impedance Value: 1025 Ohm
Lead Channel Impedance Value: 490 Ohm
Lead Channel Impedance Value: 990 Ohm
Lead Channel Pacing Threshold Amplitude: 1.75 V
Lead Channel Pacing Threshold Amplitude: 1.875 V
Lead Channel Pacing Threshold Amplitude: 2.25 V
Lead Channel Pacing Threshold Pulse Width: 0.5 ms
Lead Channel Pacing Threshold Pulse Width: 0.7 ms
Lead Channel Pacing Threshold Pulse Width: 0.8 ms
Lead Channel Sensing Intrinsic Amplitude: 2.9 mV
Lead Channel Sensing Intrinsic Amplitude: 7.2 mV
Lead Channel Setting Pacing Amplitude: 3.25 V
Lead Channel Setting Pacing Amplitude: 3.375
Lead Channel Setting Pacing Amplitude: 3.5 V
Lead Channel Setting Pacing Pulse Width: 0.7 ms
Lead Channel Setting Pacing Pulse Width: 0.8 ms
Lead Channel Setting Sensing Sensitivity: 4.5 mV
Pulse Gen Model: 3222
Pulse Gen Serial Number: 7738731

## 2018-12-31 NOTE — Progress Notes (Signed)
Cardiology Office Note   Date:  01/02/2019   ID:  Debra Lynn, DOB Jun 20, 1928, MRN [id number]  PCP:  Kathyrn Lass, MD    No chief complaint on file.  AFib  Wt Readings from Last 3 Encounters:  01/02/19 144 lb 12.8 oz (65.7 kg)  10/27/18 148 lb (67.1 kg)  02/02/18 143 lb 8 oz (65.1 kg)       History of Present Illness: Debra Lynn is a 85 y.o. female    with pacemaker and atrial fibrillation and prior CVA. She has hadunsteadiness and lightheadedness as well. She sees aneurologist forher neuropathy.   She used to have this issue with standing. Now it is more frequent and not always dependent on change in positions. Lying down gives relief. SHe is walking with a walker, now most of the time.   She had a chest wall cancer recurrence.  In 2019 it was noted: she has not fallen- using walker regularly.  She eats less and has lost weight.    Past Medical History:  Diagnosis Date  . Acute ischemic stroke ([place]) 08/23/2012  . Acute, but ill-defined, cerebrovascular disease 11/08/2012  . Arthritis    . Atrial fibrillation ([place])    a. s/p AVN ablation  . Cancer [facility]) 12/2010   breast with recurrence s/p recent XRT  . Complete heart block ([facility])    a. s/p STJ CRTP  . Compression fracture        . CVA 05/07/2008      . Fibrocystic breast   . GERD (gastroesophageal reflux disease)   . Glaucoma   . Hyperlipidemia   . Hypertension   . Orthostatic hypotension   . Overactive bladder   . Parkinsonism ([place])   . Pericarditis       . 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 Seasonal allergic rhinitis    Pos Skin Test 07-05-07  . Seizures ([place]) 2013      . Thoracic aneurysm without mention of rupture     Past Surgical History:  Procedure Laterality Date  . ABDOMINAL HYSTERECTOMY  1975   pt. unsure if laparascopic/vaginal/abdominal  . ABLATION  1980s   AVN ablation   . APPENDECTOMY    . BI-VENTRICULAR PACEMAKER UPGRADE  01/2007   3rd  device, upgraded to SJM Frontier II BiV pacemaker by Dr Leonia Reeves  . BREAST SURGERY Right 01/15/11   mastectomy  . CATARACT EXTRACTION Bilateral   . CHOLECYSTECTOMY    . EP IMPLANTABLE DEVICE N/A 06/27/2014   CRT-P  (SJM) gen change by Dr Rayann Heman  . EYE SURGERY     For glaucoma  . FLEXIBLE BRONCHOSCOPY W/ UPPER ENDOSCOPY     and swallowing evaluation  . KNEE ARTHROSCOPY Right    Arthroscopic for torn meniscus  . MASS EXCISION Right 01/19/2013   Procedure: EXCISION CHEST WALL MASS;  Surgeon: [name], MD;  Location: [facility];  Service: General;  Laterality: Right;  . PACEMAKER INSERTION     1992; gen change 2004  . VESICOVAGINAL FISTULA CLOSURE W/ TAH       Current Outpatient Medications  Medication Sig Dispense Refill  . acetaminophen (TYLENOL) 500 MG tablet Take 1,000 mg by mouth every 6 (six) hours as needed for moderate pain (pain).     . benzonatate (TESSALON) 100 MG capsule TAKE 1 CAPSULE BY MOUTH 3 TIMES A DAY AS NEEDED FOR COUGH 90 capsule 3  . Calcium Carbonate-Vitamin D (CALCIUM-VITAMIN D) 500-200 MG-UNIT per tablet  Take 1 tablet by mouth daily.     . cephALEXin (KEFLEX) 250 MG capsule Take 250 mg by mouth at bedtime.    . colestipol (COLESTID) 1 G tablet Take 1 g by mouth at bedtime.    . furosemide (LASIX) 40 MG tablet TAKE 1 TABLET BY MOUTH IN  THE MORNING 90 tablet 0  . levETIRAcetam (KEPPRA) 250 MG tablet Take 250 mg by mouth 2 (two) times daily.    . Multiple Vitamin (MULTIVITAMIN WITH MINERALS) TABS tablet Take 1 tablet by mouth daily.    . pantoprazole (PROTONIX) 40 MG tablet Take 1 tablet by mouth daily.    . potassium chloride SA (KLOR-CON) 20 MEQ tablet TAKE 1 TABLET BY MOUTH  DAILY 90 tablet 0  . pravastatin (PRAVACHOL) 80 MG tablet TAKE 1 TABLET BY MOUTH  DAILY 90 tablet 3  . Probiotic Product (ALIGN) 4 MG CAPS Take 4 mg by mouth daily. 30 capsule 0  . propranolol (INDERAL) 60 MG tablet TAKE ONE-HALF TABLET BY  MOUTH TWICE DAILY 90 tablet 0  . warfarin  (COUMADIN) 2.5 MG tablet TAKE BY MOUTH AS DIRECTED  BY COUMADIN CLINIC 90 tablet 1   No current facility-administered medications for this visit.     Allergies:   Patient has no known allergies.    Social History:  The patient  reports that she has never smoked. She has never used smokeless tobacco. She reports that she does not drink alcohol or use drugs.   Family History:  The patient's family history includes Asthma in her brother and daughter; Cancer in her father and unknown relative; Diabetes in her brother; Heart attack in her brother; Heart disease in her unknown relative; Heart failure in her mother; Hypertension in her brother; Lymphoma in her brother.    ROS:  Please see the history of present illness.   Otherwise, review of systems are positive for 1 fall while bending down.   All other systems are reviewed and negative.    PHYSICAL EXAM: VS:  BP 130/64   Pulse 68   Ht 5\' 3"  (1.6 m)   Wt 144 lb 12.8 oz (65.7 kg)   SpO2 96%   BMI 25.65 kg/m  , BMI Body mass index is 25.65 kg/m. GEN: Well nourished, well developed, in no acute distress  HEENT: normal  Neck: no JVD, carotid bruits, or masses Cardiac: RRR; no murmurs, rubs, or gallops,no edema  Respiratory:  clear to auscultation bilaterally, normal work of breathing GI: soft, nontender, nondistended, + BS MS: no deformity or atrophy  Skin: warm and dry, no rash Neuro:  Strength and sensation are intact Psych: euthymic mood, full affect   EKG:   The ekg ordered today demonstrates V-paced rhythm   Recent Labs: 02/02/2018: ALT 19; BUN 17; Creatinine, Ser 0.87; Hemoglobin 12.3; Platelets 182; Potassium 3.9; Sodium 141   Lipid Panel    Component Value Date/Time   CHOL 186 07/22/2014 0921   TRIG 274.0 (H) 07/22/2014 0921   HDL 59.00 07/22/2014 0921   CHOLHDL 3 07/22/2014 0921   VLDL 54.8 (H) 07/22/2014 0921   LDLCALC 97 06/21/2013 1001   LDLDIRECT 84.0 07/22/2014 0921     Other studies Reviewed: Additional  studies/ records that were reviewed today with results demonstrating: .   ASSESSMENT AND PLAN:  1. AFib: h/o stroke.  On COumadin. Check CBC to r/o anemia.  Had a fall where she bent down and fell.  Unclear that it was true syncope.  No injuries.  2. Hyperlipidemia: lipids well controlled at last check in 11/2018. 3. HTN: The current medical regimen is effective;  continue present plan and medications. 4. DOE: deconditioning.  Encouraged activity with her pedal machine. 5. Pacer checks with Dr. Rayann Heman.   Current medicines are reviewed at length with the patient today.  The patient concerns regarding her medicines were addressed.  The following changes have been made:  No change  Labs/ tests ordered today include:  No orders of the defined types were placed in this encounter.   Recommend 150 minutes/week of aerobic exercise Low fat, low carb, high fiber diet recommended  Disposition:   FU in 1 year   Signed, Larae Grooms, MD  01/02/2019 1:42 PM    [facility] [address], [place], [place]  [address] Phone: [phone]; Fax: [phone]

## 2019-01-02 ENCOUNTER — Ambulatory Visit: Payer: Medicare Other | Admitting: Interventional Cardiology

## 2019-01-02 ENCOUNTER — Encounter: Payer: Self-pay | Admitting: Interventional Cardiology

## 2019-01-02 ENCOUNTER — Ambulatory Visit (INDEPENDENT_AMBULATORY_CARE_PROVIDER_SITE_OTHER): Payer: Medicare Other | Admitting: *Deleted

## 2019-01-02 ENCOUNTER — Other Ambulatory Visit: Payer: Self-pay

## 2019-01-02 VITALS — BP 130/64 | HR 68 | Ht 63.0 in | Wt 144.8 lb

## 2019-01-02 DIAGNOSIS — Z950 Presence of cardiac pacemaker: Secondary | ICD-10-CM

## 2019-01-02 DIAGNOSIS — Z8673 Personal history of transient ischemic attack (TIA), and cerebral infarction without residual deficits: Secondary | ICD-10-CM

## 2019-01-02 DIAGNOSIS — I10 Essential (primary) hypertension: Secondary | ICD-10-CM

## 2019-01-02 DIAGNOSIS — Z5181 Encounter for therapeutic drug level monitoring: Secondary | ICD-10-CM | POA: Diagnosis not present

## 2019-01-02 DIAGNOSIS — I4891 Unspecified atrial fibrillation: Secondary | ICD-10-CM

## 2019-01-02 DIAGNOSIS — I5022 Chronic systolic (congestive) heart failure: Secondary | ICD-10-CM | POA: Diagnosis not present

## 2019-01-02 DIAGNOSIS — E782 Mixed hyperlipidemia: Secondary | ICD-10-CM

## 2019-01-02 LAB — POCT INR: INR: 1.4 — AB (ref 2.0–3.0)

## 2019-01-02 NOTE — Patient Instructions (Signed)
Medication Instructions:  Your physician recommends that you continue on your current medications as directed. Please refer to the Current Medication list given to you today.  *If you need a refill on your cardiac medications before your next appointment, please call your pharmacy*  Lab Work: TODAY: CBC  If you have labs (blood work) drawn today and your tests are completely normal, you will receive your results only by: Marland Kitchen MyChart Message (if you have MyChart) OR . A paper copy in the mail If you have any lab test that is abnormal or we need to change your treatment, we will call you to review the results.  Testing/Procedures: None ordered  Follow-Up: At [facility], you and your health needs are our priority.  As part of our continuing mission to provide you with exceptional heart care, we have created designated Provider Care Teams.  These Care Teams include your primary Cardiologist (physician) and Advanced Practice Providers (APPs -  Physician Assistants and Nurse Practitioners) who all work together to provide you with the care you need, when you need it.  Your next appointment:   12 month(s)  The format for your next appointment:   In Person  Provider:   You may see Larae Grooms, MD or one of the following Advanced Practice Providers on your designated Care Team:    Melina Copa, PA-C  Ermalinda Barrios, PA-C   Other Instructions

## 2019-01-02 NOTE — Patient Instructions (Signed)
Description   Take 1 tablet today and 1.5 tablets tomorrow, then Continue taking 1 tablet daily except 1/2 tablet on Tuesdays, Thursdays, and Saturdays.  Recheck INR in 2 weeks. Call coumadin clinic [phone] with any changes in medications or upcoming procedures

## 2019-01-03 LAB — CBC
Hematocrit: 39.6 % (ref 34.0–46.6)
Hemoglobin: 13.5 g/dL (ref 11.1–15.9)
MCH: 30.4 pg (ref 26.6–33.0)
MCHC: 34.1 g/dL (ref 31.5–35.7)
MCV: 89 fL (ref 79–97)
Platelets: 199 10*3/uL (ref 150–450)
RBC: 4.44 x10E6/uL (ref 3.77–5.28)
RDW: 13.3 % (ref 11.7–15.4)
WBC: 7.3 10*3/uL (ref 3.4–10.8)

## 2019-01-09 NOTE — Progress Notes (Signed)
Remote pacemaker transmission.   

## 2019-01-17 ENCOUNTER — Other Ambulatory Visit: Payer: Self-pay

## 2019-01-17 ENCOUNTER — Ambulatory Visit: Payer: Medicare Other

## 2019-01-17 DIAGNOSIS — I4891 Unspecified atrial fibrillation: Secondary | ICD-10-CM | POA: Diagnosis not present

## 2019-01-17 DIAGNOSIS — Z5181 Encounter for therapeutic drug level monitoring: Secondary | ICD-10-CM | POA: Diagnosis not present

## 2019-01-17 LAB — POCT INR: INR: 1.6 — AB (ref 2.0–3.0)

## 2019-01-17 NOTE — Patient Instructions (Signed)
Description   Take 1.5 tablets today, then start taking 1 tablet daily except 1/2 tablet on Tuesdays and Saturdays.  Recheck INR in 2 weeks. Call coumadin clinic [phone] with any changes in medications or upcoming procedures

## 2019-01-30 ENCOUNTER — Other Ambulatory Visit: Payer: Self-pay | Admitting: Interventional Cardiology

## 2019-01-31 ENCOUNTER — Other Ambulatory Visit: Payer: Self-pay

## 2019-01-31 ENCOUNTER — Ambulatory Visit: Payer: Medicare Other | Admitting: *Deleted

## 2019-01-31 DIAGNOSIS — I4891 Unspecified atrial fibrillation: Secondary | ICD-10-CM

## 2019-01-31 DIAGNOSIS — Z5181 Encounter for therapeutic drug level monitoring: Secondary | ICD-10-CM | POA: Diagnosis not present

## 2019-01-31 LAB — POCT INR: INR: 2.4 (ref 2.0–3.0)

## 2019-01-31 NOTE — Patient Instructions (Signed)
Description   Continue taking 1 tablet daily except 1/2 tablet on Tuesdays and Saturdays. Recheck INR in 4 weeks. Call coumadin clinic [phone] with any changes in medications or upcoming procedures

## 2019-02-05 ENCOUNTER — Other Ambulatory Visit: Payer: Self-pay | Admitting: Interventional Cardiology

## 2019-02-13 ENCOUNTER — Ambulatory Visit: Payer: Medicare Other | Attending: Internal Medicine

## 2019-02-13 DIAGNOSIS — Z23 Encounter for immunization: Secondary | ICD-10-CM | POA: Insufficient documentation

## 2019-02-28 ENCOUNTER — Ambulatory Visit (INDEPENDENT_AMBULATORY_CARE_PROVIDER_SITE_OTHER): Payer: Medicare Other

## 2019-02-28 ENCOUNTER — Other Ambulatory Visit: Payer: Self-pay

## 2019-02-28 DIAGNOSIS — Z5181 Encounter for therapeutic drug level monitoring: Secondary | ICD-10-CM

## 2019-02-28 DIAGNOSIS — I4891 Unspecified atrial fibrillation: Secondary | ICD-10-CM | POA: Diagnosis not present

## 2019-02-28 LAB — POCT INR: INR: 1.4 — AB (ref 2.0–3.0)

## 2019-02-28 NOTE — Patient Instructions (Signed)
Description   Take 1.5 tablets today and tomorrow, then resume same dosage 1 tablet daily except 1/2 tablet on Tuesdays and Saturdays. Recheck INR in 2 weeks. Call coumadin clinic [phone] with any changes in medications or upcoming procedures

## 2019-03-05 ENCOUNTER — Ambulatory Visit: Payer: Medicare Other | Attending: Internal Medicine

## 2019-03-05 DIAGNOSIS — Z23 Encounter for immunization: Secondary | ICD-10-CM

## 2019-03-05 NOTE — Progress Notes (Signed)
   Covid-19 Vaccination Clinic  Name:  Debra Lynn    MRN: [id number] DOB: 06/24/1928  03/05/2019  [name] was observed post Covid-19 immunization for 15 minutes without incidence. She was provided with Vaccine Information Sheet and instruction to access the V-Safe system.   [name] was instructed to call 911 with any severe reactions post vaccine: Marland Kitchen Difficulty breathing  . Swelling of your face and throat  . A fast heartbeat  . A bad rash all over your body  . Dizziness and weakness    Immunizations Administered    Name Date Dose VIS Date Route   Pfizer COVID-19 Vaccine 03/05/2019 11:07 AM 0.3 mL 01/12/2019 Intramuscular   Manufacturer: [place]   Lot: [id number]   [place]: [id number]

## 2019-03-12 ENCOUNTER — Ambulatory Visit (INDEPENDENT_AMBULATORY_CARE_PROVIDER_SITE_OTHER): Payer: Medicare Other | Admitting: *Deleted

## 2019-03-12 DIAGNOSIS — I4821 Permanent atrial fibrillation: Secondary | ICD-10-CM | POA: Diagnosis not present

## 2019-03-12 LAB — CUP PACEART REMOTE DEVICE CHECK
Battery Remaining Longevity: 45 mo
Battery Remaining Percentage: 71 %
Battery Voltage: 2.92 V
Brady Statistic AP VP Percent: 22 %
Brady Statistic AP VS Percent: 1 %
Brady Statistic AS VP Percent: 78 %
Brady Statistic AS VS Percent: 1 %
Brady Statistic RA Percent Paced: 22 %
Date Time Interrogation Session: 20210208020012
Implantable Lead Implant Date: 20081223
Implantable Lead Implant Date: 20081223
Implantable Lead Implant Date: 20081223
Implantable Lead Location: 753858
Implantable Lead Location: 753859
Implantable Lead Location: 753860
Implantable Lead Model: 5024
Implantable Lead Model: 5524
Implantable Pulse Generator Implant Date: 20160526
Lead Channel Impedance Value: 1050 Ohm
Lead Channel Impedance Value: 1075 Ohm
Lead Channel Impedance Value: 510 Ohm
Lead Channel Pacing Threshold Amplitude: 1.375 V
Lead Channel Pacing Threshold Amplitude: 1.75 V
Lead Channel Pacing Threshold Amplitude: 2.25 V
Lead Channel Pacing Threshold Pulse Width: 0.5 ms
Lead Channel Pacing Threshold Pulse Width: 0.7 ms
Lead Channel Pacing Threshold Pulse Width: 0.8 ms
Lead Channel Sensing Intrinsic Amplitude: 12 mV
Lead Channel Sensing Intrinsic Amplitude: 3.6 mV
Lead Channel Setting Pacing Amplitude: 2.375
Lead Channel Setting Pacing Amplitude: 3.25 V
Lead Channel Setting Pacing Amplitude: 3.5 V
Lead Channel Setting Pacing Pulse Width: 0.7 ms
Lead Channel Setting Pacing Pulse Width: 0.8 ms
Lead Channel Setting Sensing Sensitivity: 4.5 mV
Pulse Gen Model: 3222
Pulse Gen Serial Number: 7738731

## 2019-03-13 NOTE — Progress Notes (Signed)
PPM Remote  

## 2019-03-14 ENCOUNTER — Other Ambulatory Visit: Payer: Self-pay

## 2019-03-14 ENCOUNTER — Ambulatory Visit (INDEPENDENT_AMBULATORY_CARE_PROVIDER_SITE_OTHER): Payer: Medicare Other | Admitting: *Deleted

## 2019-03-14 DIAGNOSIS — Z5181 Encounter for therapeutic drug level monitoring: Secondary | ICD-10-CM

## 2019-03-14 DIAGNOSIS — I4891 Unspecified atrial fibrillation: Secondary | ICD-10-CM

## 2019-03-14 LAB — POCT INR: INR: 1.8 — AB (ref 2.0–3.0)

## 2019-03-14 NOTE — Patient Instructions (Signed)
Description   Take 1.5 tablets today then start taking 1 tablet daily except 1/2 tablet on Saturdays. Recheck INR in 2 weeks. Call coumadin clinic [phone] with any changes in medications or upcoming procedures

## 2019-03-28 ENCOUNTER — Other Ambulatory Visit: Payer: Self-pay

## 2019-03-28 ENCOUNTER — Ambulatory Visit (INDEPENDENT_AMBULATORY_CARE_PROVIDER_SITE_OTHER): Payer: Medicare Other | Admitting: Pharmacist

## 2019-03-28 DIAGNOSIS — I4891 Unspecified atrial fibrillation: Secondary | ICD-10-CM

## 2019-03-28 DIAGNOSIS — Z5181 Encounter for therapeutic drug level monitoring: Secondary | ICD-10-CM | POA: Diagnosis not present

## 2019-03-28 LAB — POCT INR: INR: 2.4 (ref 2.0–3.0)

## 2019-03-28 NOTE — Patient Instructions (Signed)
Description   Continue taking 1 tablet daily except 1/2 tablet on Saturdays. Recheck INR in 3 weeks. Call coumadin clinic [phone] with any changes in medications or upcoming procedures

## 2019-04-19 ENCOUNTER — Ambulatory Visit: Payer: Medicare Other | Admitting: *Deleted

## 2019-04-19 ENCOUNTER — Other Ambulatory Visit: Payer: Self-pay

## 2019-04-19 DIAGNOSIS — I4891 Unspecified atrial fibrillation: Secondary | ICD-10-CM | POA: Diagnosis not present

## 2019-04-19 DIAGNOSIS — Z5181 Encounter for therapeutic drug level monitoring: Secondary | ICD-10-CM | POA: Diagnosis not present

## 2019-04-19 LAB — POCT INR: INR: 3.3 — AB (ref 2.0–3.0)

## 2019-04-19 NOTE — Patient Instructions (Signed)
Description   Hold today's dose then continue taking 1 tablet daily except 1/2 tablet on Saturdays. Recheck INR in 3 weeks. Call coumadin clinic [phone] with any changes in medications or upcoming procedures

## 2019-05-10 ENCOUNTER — Ambulatory Visit: Payer: Medicare Other | Admitting: Pharmacist

## 2019-05-10 ENCOUNTER — Other Ambulatory Visit: Payer: Self-pay

## 2019-05-10 DIAGNOSIS — I4891 Unspecified atrial fibrillation: Secondary | ICD-10-CM | POA: Diagnosis not present

## 2019-05-10 DIAGNOSIS — Z5181 Encounter for therapeutic drug level monitoring: Secondary | ICD-10-CM | POA: Diagnosis not present

## 2019-05-10 LAB — POCT INR: INR: 3 (ref 2.0–3.0)

## 2019-05-10 NOTE — Patient Instructions (Signed)
Description   Continue taking 1 tablet daily except 1/2 tablet on Saturdays. Recheck INR in 4 weeks. Call coumadin clinic [phone] with any changes in medications or upcoming procedures

## 2019-06-07 ENCOUNTER — Other Ambulatory Visit: Payer: Self-pay

## 2019-06-07 ENCOUNTER — Ambulatory Visit: Payer: Medicare Other | Admitting: *Deleted

## 2019-06-07 DIAGNOSIS — I4891 Unspecified atrial fibrillation: Secondary | ICD-10-CM | POA: Diagnosis not present

## 2019-06-07 DIAGNOSIS — Z5181 Encounter for therapeutic drug level monitoring: Secondary | ICD-10-CM

## 2019-06-07 LAB — POCT INR: INR: 2.9 (ref 2.0–3.0)

## 2019-06-07 NOTE — Patient Instructions (Signed)
Description   Continue taking 1 tablet daily except 1/2 tablet on Saturdays. Recheck INR in 5 weeks. Call coumadin clinic [phone] with any changes in medications or upcoming procedures

## 2019-06-11 ENCOUNTER — Ambulatory Visit (INDEPENDENT_AMBULATORY_CARE_PROVIDER_SITE_OTHER): Payer: Medicare Other | Admitting: *Deleted

## 2019-06-11 DIAGNOSIS — I4821 Permanent atrial fibrillation: Secondary | ICD-10-CM

## 2019-06-12 LAB — CUP PACEART REMOTE DEVICE CHECK
Battery Remaining Longevity: 34 mo
Battery Remaining Percentage: 56 %
Battery Voltage: 2.89 V
Brady Statistic AP VP Percent: 22 %
Brady Statistic AP VS Percent: 1 %
Brady Statistic AS VP Percent: 78 %
Brady Statistic AS VS Percent: 1 %
Brady Statistic RA Percent Paced: 21 %
Date Time Interrogation Session: 20210510223924
Implantable Lead Implant Date: 20081223
Implantable Lead Implant Date: 20081223
Implantable Lead Implant Date: 20081223
Implantable Lead Location: 753858
Implantable Lead Location: 753859
Implantable Lead Location: 753860
Implantable Lead Model: 5024
Implantable Lead Model: 5524
Implantable Pulse Generator Implant Date: 20160526
Lead Channel Impedance Value: 1025 Ohm
Lead Channel Impedance Value: 1025 Ohm
Lead Channel Impedance Value: 490 Ohm
Lead Channel Pacing Threshold Amplitude: 1.375 V
Lead Channel Pacing Threshold Amplitude: 1.75 V
Lead Channel Pacing Threshold Amplitude: 2.375 V
Lead Channel Pacing Threshold Pulse Width: 0.5 ms
Lead Channel Pacing Threshold Pulse Width: 0.7 ms
Lead Channel Pacing Threshold Pulse Width: 0.8 ms
Lead Channel Sensing Intrinsic Amplitude: 12 mV
Lead Channel Sensing Intrinsic Amplitude: 3 mV
Lead Channel Setting Pacing Amplitude: 2.375
Lead Channel Setting Pacing Amplitude: 3.375
Lead Channel Setting Pacing Amplitude: 3.5 V
Lead Channel Setting Pacing Pulse Width: 0.7 ms
Lead Channel Setting Pacing Pulse Width: 0.8 ms
Lead Channel Setting Sensing Sensitivity: 4.5 mV
Pulse Gen Model: 3222
Pulse Gen Serial Number: 7738731

## 2019-06-13 NOTE — Progress Notes (Signed)
Remote pacemaker transmission.   

## 2019-07-12 ENCOUNTER — Other Ambulatory Visit: Payer: Self-pay | Admitting: Interventional Cardiology

## 2019-07-12 ENCOUNTER — Other Ambulatory Visit: Payer: Self-pay

## 2019-07-12 ENCOUNTER — Ambulatory Visit: Payer: Medicare Other | Admitting: Pharmacist

## 2019-07-12 DIAGNOSIS — Z5181 Encounter for therapeutic drug level monitoring: Secondary | ICD-10-CM | POA: Diagnosis not present

## 2019-07-12 DIAGNOSIS — I4891 Unspecified atrial fibrillation: Secondary | ICD-10-CM

## 2019-07-12 LAB — POCT INR: INR: 2.4 (ref 2.0–3.0)

## 2019-07-12 NOTE — Patient Instructions (Signed)
Description   Continue taking 1 tablet daily except 1/2 tablet on Saturdays. Recheck INR in 6 weeks. Call coumadin clinic [phone] with any changes in medications or upcoming procedures     

## 2019-08-09 ENCOUNTER — Other Ambulatory Visit: Payer: Self-pay | Admitting: *Deleted

## 2019-08-09 MED ORDER — WARFARIN SODIUM 2.5 MG PO TABS: ORAL_TABLET | ORAL | 0 refills | Status: DC

## 2019-08-23 ENCOUNTER — Ambulatory Visit: Payer: Medicare Other | Admitting: *Deleted

## 2019-08-23 ENCOUNTER — Other Ambulatory Visit: Payer: Self-pay

## 2019-08-23 DIAGNOSIS — Z5181 Encounter for therapeutic drug level monitoring: Secondary | ICD-10-CM | POA: Diagnosis not present

## 2019-08-23 DIAGNOSIS — I4891 Unspecified atrial fibrillation: Secondary | ICD-10-CM | POA: Diagnosis not present

## 2019-08-23 LAB — POCT INR: INR: 2.5 (ref 2.0–3.0)

## 2019-08-23 NOTE — Patient Instructions (Signed)
Description   Continue taking 1 tablet daily except 1/2 tablet on Saturdays. Recheck INR in 6 weeks. Call coumadin clinic [phone] with any changes in medications or upcoming procedures     

## 2019-09-10 ENCOUNTER — Ambulatory Visit (INDEPENDENT_AMBULATORY_CARE_PROVIDER_SITE_OTHER): Payer: Medicare Other | Admitting: *Deleted

## 2019-09-10 DIAGNOSIS — I442 Atrioventricular block, complete: Secondary | ICD-10-CM | POA: Diagnosis not present

## 2019-09-12 LAB — CUP PACEART REMOTE DEVICE CHECK
Battery Remaining Longevity: 28 mo
Battery Remaining Percentage: 50 %
Battery Voltage: 2.87 V
Brady Statistic AP VP Percent: 23 %
Brady Statistic AP VS Percent: 1 %
Brady Statistic AS VP Percent: 77 %
Brady Statistic AS VS Percent: 1 %
Brady Statistic RA Percent Paced: 23 %
Date Time Interrogation Session: 20210809020013
Implantable Lead Implant Date: 20081223
Implantable Lead Implant Date: 20081223
Implantable Lead Implant Date: 20081223
Implantable Lead Location: 753858
Implantable Lead Location: 753859
Implantable Lead Location: 753860
Implantable Lead Model: 5024
Implantable Lead Model: 5524
Implantable Pulse Generator Implant Date: 20160526
Lead Channel Impedance Value: 1000 Ohm
Lead Channel Impedance Value: 1100 Ohm
Lead Channel Impedance Value: 490 Ohm
Lead Channel Pacing Threshold Amplitude: 1.75 V
Lead Channel Pacing Threshold Amplitude: 1.875 V
Lead Channel Pacing Threshold Amplitude: 2.125 V
Lead Channel Pacing Threshold Pulse Width: 0.5 ms
Lead Channel Pacing Threshold Pulse Width: 0.7 ms
Lead Channel Pacing Threshold Pulse Width: 0.8 ms
Lead Channel Sensing Intrinsic Amplitude: 10 mV
Lead Channel Sensing Intrinsic Amplitude: 3.1 mV
Lead Channel Setting Pacing Amplitude: 3.125
Lead Channel Setting Pacing Amplitude: 3.375
Lead Channel Setting Pacing Amplitude: 3.5 V
Lead Channel Setting Pacing Pulse Width: 0.7 ms
Lead Channel Setting Pacing Pulse Width: 0.8 ms
Lead Channel Setting Sensing Sensitivity: 4.5 mV
Pulse Gen Model: 3222
Pulse Gen Serial Number: 7738731

## 2019-09-13 NOTE — Progress Notes (Signed)
Remote pacemaker transmission.   

## 2019-10-04 ENCOUNTER — Other Ambulatory Visit: Payer: Self-pay

## 2019-10-04 ENCOUNTER — Ambulatory Visit: Payer: Medicare Other

## 2019-10-04 DIAGNOSIS — I4891 Unspecified atrial fibrillation: Secondary | ICD-10-CM

## 2019-10-04 DIAGNOSIS — Z5181 Encounter for therapeutic drug level monitoring: Secondary | ICD-10-CM | POA: Diagnosis not present

## 2019-10-04 LAB — POCT INR: INR: 2.8 (ref 2.0–3.0)

## 2019-10-04 NOTE — Patient Instructions (Signed)
Description   Continue taking 1 tablet daily except 1/2 tablet on Saturdays. Recheck INR in 6 weeks. Call coumadin clinic [phone] with any changes in medications or upcoming procedures

## 2019-10-10 ENCOUNTER — Other Ambulatory Visit: Payer: Self-pay | Admitting: Interventional Cardiology

## 2019-11-15 ENCOUNTER — Other Ambulatory Visit: Payer: Self-pay

## 2019-11-15 ENCOUNTER — Ambulatory Visit: Payer: Medicare Other | Admitting: *Deleted

## 2019-11-15 DIAGNOSIS — Z5181 Encounter for therapeutic drug level monitoring: Secondary | ICD-10-CM | POA: Diagnosis not present

## 2019-11-15 DIAGNOSIS — I4891 Unspecified atrial fibrillation: Secondary | ICD-10-CM

## 2019-11-15 LAB — POCT INR: INR: 3.1 — AB (ref 2.0–3.0)

## 2019-11-15 NOTE — Patient Instructions (Signed)
Description   Tonight take 1/2 tablet then continue taking 1 tablet daily except 1/2 tablet on Saturdays. Recheck INR in 6 weeks. Call coumadin clinic [phone] with any changes in medications or upcoming procedures

## 2019-11-20 ENCOUNTER — Other Ambulatory Visit: Payer: Self-pay | Admitting: Interventional Cardiology

## 2019-12-10 ENCOUNTER — Ambulatory Visit (INDEPENDENT_AMBULATORY_CARE_PROVIDER_SITE_OTHER): Payer: Medicare Other

## 2019-12-10 DIAGNOSIS — I4821 Permanent atrial fibrillation: Secondary | ICD-10-CM | POA: Diagnosis not present

## 2019-12-11 LAB — CUP PACEART REMOTE DEVICE CHECK
Battery Remaining Longevity: 23 mo
Battery Remaining Percentage: 38 %
Battery Voltage: 2.84 V
Brady Statistic AP VP Percent: 25 %
Brady Statistic AP VS Percent: 1 %
Brady Statistic AS VP Percent: 75 %
Brady Statistic AS VS Percent: 1 %
Brady Statistic RA Percent Paced: 24 %
Date Time Interrogation Session: 20211108130339
Implantable Lead Implant Date: 20081223
Implantable Lead Implant Date: 20081223
Implantable Lead Implant Date: 20081223
Implantable Lead Location: 753858
Implantable Lead Location: 753859
Implantable Lead Location: 753860
Implantable Lead Model: 5024
Implantable Lead Model: 5524
Implantable Pulse Generator Implant Date: 20160526
Lead Channel Impedance Value: 1025 Ohm
Lead Channel Impedance Value: 490 Ohm
Lead Channel Impedance Value: 990 Ohm
Lead Channel Pacing Threshold Amplitude: 1.75 V
Lead Channel Pacing Threshold Amplitude: 1.75 V
Lead Channel Pacing Threshold Amplitude: 2 V
Lead Channel Pacing Threshold Pulse Width: 0.5 ms
Lead Channel Pacing Threshold Pulse Width: 0.7 ms
Lead Channel Pacing Threshold Pulse Width: 0.8 ms
Lead Channel Sensing Intrinsic Amplitude: 10 mV
Lead Channel Sensing Intrinsic Amplitude: 2.3 mV
Lead Channel Setting Pacing Amplitude: 3 V
Lead Channel Setting Pacing Amplitude: 3.25 V
Lead Channel Setting Pacing Amplitude: 3.5 V
Lead Channel Setting Pacing Pulse Width: 0.7 ms
Lead Channel Setting Pacing Pulse Width: 0.8 ms
Lead Channel Setting Sensing Sensitivity: 4.5 mV
Pulse Gen Model: 3222
Pulse Gen Serial Number: 7738731

## 2019-12-12 NOTE — Progress Notes (Signed)
Remote pacemaker transmission.   

## 2019-12-25 ENCOUNTER — Other Ambulatory Visit: Payer: Self-pay

## 2019-12-25 ENCOUNTER — Ambulatory Visit: Payer: Medicare Other

## 2019-12-25 DIAGNOSIS — I4891 Unspecified atrial fibrillation: Secondary | ICD-10-CM

## 2019-12-25 DIAGNOSIS — Z5181 Encounter for therapeutic drug level monitoring: Secondary | ICD-10-CM

## 2019-12-25 LAB — POCT INR: INR: 3.1 — AB (ref 2.0–3.0)

## 2019-12-25 NOTE — Patient Instructions (Signed)
Description   Start taking 1 tablet daily except 1/2 tablet on Saturdays and Tuesdays. Recheck INR in 6 weeks. Call coumadin clinic [phone] with any changes in medications or upcoming procedures

## 2020-01-15 NOTE — Progress Notes (Signed)
Cardiology Office Note   Date:  01/16/2020   ID:  Debra Lynn, DOB 01-Jun-1928, MRN [id number]  PCP:  Kathyrn Lass, MD    No chief complaint on file.  AFib  Wt Readings from Last 3 Encounters:  01/16/20 141 lb (64 kg)  01/02/19 144 lb 12.8 oz (65.7 kg)  10/27/18 148 lb (67.1 kg)       History of Present Illness: Debra Lynn is a 86 y.o. female  with pacemaker and atrial fibrillation and prior CVA. She has hadunsteadiness and lightheadedness as well. She sees aneurologist forher neuropathy.   She used to have this issue with standing. Now it is more frequent and not always dependent on change in positions. Lying down gives relief. SHe is walking with a walker, now most of the time.   She had a chest wall cancer recurrence.   Husband now going more towards palliative care route.   Denies : exertional Chest pain. Dizziness. Nitroglycerin use. Orthopnea. Palpitations. Paroxysmal nocturnal dyspnea. Shortness of breath. Syncope.   No falls.  Coumadin dose is steady.     Past Medical History:  Diagnosis Date  . Acute ischemic stroke ([place]) 08/23/2012  . Acute, but ill-defined, cerebrovascular disease 11/08/2012  . Arthritis    . Atrial fibrillation ([place])    a. s/p AVN ablation  . Cancer [facility]) 12/2010   breast with recurrence s/p recent XRT  . Complete heart block ([facility])    a. s/p STJ CRTP  . Compression fracture        . CVA 05/07/2008      . Fibrocystic breast   . GERD (gastroesophageal reflux disease)   . Glaucoma   . Hyperlipidemia   . Hypertension   . Orthostatic hypotension   . Overactive bladder   . Parkinsonism ([place])   . Pericarditis       . 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 Seasonal allergic rhinitis    Pos Skin Test 07-05-07  . Seizures ([place]) 2013      . Thoracic aneurysm without mention of rupture     Past Surgical History:  Procedure Laterality Date  . ABDOMINAL HYSTERECTOMY  1975   pt. unsure if  laparascopic/vaginal/abdominal  . ABLATION  1980s   AVN ablation   . APPENDECTOMY    . BI-VENTRICULAR PACEMAKER UPGRADE  01/2007   3rd device, upgraded to [facility] Frontier II BiV pacemaker by Dr Leonia Reeves  . BREAST SURGERY Right 01/15/11   mastectomy  . CATARACT EXTRACTION Bilateral   . CHOLECYSTECTOMY    . EP IMPLANTABLE DEVICE N/A 06/27/2014   CRT-P  ([facility]) gen change by Dr Rayann Heman  . EYE SURGERY     For glaucoma  . FLEXIBLE BRONCHOSCOPY W/ UPPER ENDOSCOPY     and swallowing evaluation  . KNEE ARTHROSCOPY Right    Arthroscopic for torn meniscus  . MASS EXCISION Right 01/19/2013   Procedure: EXCISION CHEST WALL MASS;  Surgeon: [name], MD;  Location: [facility] ORS;  Service: General;  Laterality: Right;  . PACEMAKER INSERTION     1992; gen change 2004  . VESICOVAGINAL FISTULA CLOSURE W/ TAH       Current Outpatient Medications  Medication Sig Dispense Refill  . acetaminophen (TYLENOL) 500 MG tablet Take 1,000 mg by mouth every 6 (six) hours as needed for moderate pain (pain).     Marland Kitchen alendronate (FOSAMAX) 70 MG tablet Take 70 mg by mouth once a week. Take with a full glass  of water on an empty stomach.    . benzonatate (TESSALON) 100 MG capsule TAKE 1 CAPSULE BY MOUTH 3 TIMES A DAY AS NEEDED FOR COUGH 90 capsule 3  . Calcium Carbonate-Vitamin D (CALCIUM-VITAMIN D) 500-200 MG-UNIT per tablet Take 1 tablet by mouth daily.    . cephALEXin (KEFLEX) 250 MG capsule Take 250 mg by mouth at bedtime.    . colestipol (COLESTID) 1 G tablet Take 1 g by mouth at bedtime.    . furosemide (LASIX) 40 MG tablet TAKE 1 TABLET BY MOUTH IN  THE MORNING 90 tablet 3  . levETIRAcetam (KEPPRA) 250 MG tablet Take 250 mg by mouth 2 (two) times daily.    . Multiple Vitamin (MULTIVITAMIN WITH MINERALS) TABS tablet Take 1 tablet by mouth daily.    . pantoprazole (PROTONIX) 40 MG tablet Take 1 tablet by mouth daily.    . potassium chloride SA (KLOR-CON) 20 MEQ tablet TAKE 1 TABLET BY MOUTH  DAILY 90 tablet 3  .  pravastatin (PRAVACHOL) 80 MG tablet TAKE 1 TABLET BY MOUTH  DAILY 90 tablet 3  . Probiotic Product (ALIGN) 4 MG CAPS Take 4 mg by mouth daily. 30 capsule 0  . propranolol (INDERAL) 60 MG tablet TAKE ONE-HALF TABLET BY  MOUTH TWICE DAILY 90 tablet 3  . warfarin (COUMADIN) 2.5 MG tablet TAKE 1/2 TO 1 TABLET BY  MOUTH DAILY AS DIRECTED BY  THE COUMADIN CLINIC 90 tablet 1   No current facility-administered medications for this visit.    Allergies:   Patient has no known allergies.    Social History:  The patient  reports that she has never smoked. She has never used smokeless tobacco. She reports that she does not drink alcohol and does not use drugs.   Family History:  The patient's family history includes Asthma in her brother and daughter; Cancer in her father and unknown relative; Diabetes in her brother; Heart attack in her brother; Heart disease in her unknown relative; Heart failure in her mother; Hypertension in her brother; Lymphoma in her brother.    ROS:  Please see the history of present illness.   Otherwise, review of systems are positive for right knee swelling.   All other systems are reviewed and negative.    PHYSICAL EXAM: VS:  BP 104/60   Pulse 65   Ht 5\' 3"  (1.6 m)   Wt 141 lb (64 kg)   SpO2 97%   BMI 24.98 kg/m  , BMI Body mass index is 24.98 kg/m. GEN: frail HEENT: normal  Neck: no JVD, carotid bruits, or masses Cardiac: RRR; no murmurs, rubs, or gallops,; right leg with mild edema  Respiratory:  clear to auscultation bilaterally, normal work of breathing GI: soft, nontender, nondistended, + BS MS: no deformity or atrophy  Skin: warm and dry, no rash Neuro:  Generalized weakness Psych: euthymic mood, flat affect   EKG:   The ekg ordered today demonstrates paced rhythm   Recent Labs: No results found for requested labs within last 8760 hours.   Lipid Panel    Component Value Date/Time   CHOL 186 07/22/2014 0921   TRIG 274.0 (H) 07/22/2014 0921    HDL 59.00 07/22/2014 0921   CHOLHDL 3 07/22/2014 0921   VLDL 54.8 (H) 07/22/2014 0921   LDLCALC 97 06/21/2013 1001   LDLDIRECT 84.0 07/22/2014 0921     Other studies Reviewed: Additional studies/ records that were reviewed today with results demonstrating: labs reviewed .   ASSESSMENT AND PLAN:  1. AFib: h/o stroke.  Coumadin for stroke prevention. No bleeding issues.,  Daughter is very supportive.  2. Hyperlipidemia: labs well controlled, to be redrawn in Jan 2022.  3. HTN: The current medical regimen is effective;  continue present plan and medications. 4. DOE: Stable. 5. Pacer checks with Dr. Rayann Heman.    Current medicines are reviewed at length with the patient today.  The patient concerns regarding her medicines were addressed.  The following changes have been made:  No change  Labs/ tests ordered today include:  No orders of the defined types were placed in this encounter.   Recommend 150 minutes/week of aerobic exercise Low fat, low carb, high fiber diet recommended  Disposition:   FU in 1 year   Signed, Larae Grooms, MD  01/16/2020 2:22 PM    [facility] [address], [place], [place]  [address] Phone: [phone]; Fax: [phone]

## 2020-01-16 ENCOUNTER — Ambulatory Visit: Payer: Medicare Other | Admitting: Interventional Cardiology

## 2020-01-16 ENCOUNTER — Other Ambulatory Visit: Payer: Self-pay

## 2020-01-16 ENCOUNTER — Encounter: Payer: Self-pay | Admitting: Interventional Cardiology

## 2020-01-16 VITALS — BP 104/60 | HR 65 | Ht 63.0 in | Wt 141.0 lb

## 2020-01-16 DIAGNOSIS — I4821 Permanent atrial fibrillation: Secondary | ICD-10-CM

## 2020-01-16 DIAGNOSIS — I442 Atrioventricular block, complete: Secondary | ICD-10-CM

## 2020-01-16 DIAGNOSIS — Z950 Presence of cardiac pacemaker: Secondary | ICD-10-CM | POA: Diagnosis not present

## 2020-01-16 DIAGNOSIS — I10 Essential (primary) hypertension: Secondary | ICD-10-CM

## 2020-01-16 DIAGNOSIS — E782 Mixed hyperlipidemia: Secondary | ICD-10-CM

## 2020-01-16 NOTE — Patient Instructions (Signed)
Medication Instructions:  Your physician recommends that you continue on your current medications as directed. Please refer to the Current Medication list given to you today.  *If you need a refill on your cardiac medications before your next appointment, please call your pharmacy*   Lab Work: None ordered  If you have labs (blood work) drawn today and your tests are completely normal, you will receive your results only by: . MyChart Message (if you have MyChart) OR . A paper copy in the mail If you have any lab test that is abnormal or we need to change your treatment, we will call you to review the results.   Testing/Procedures: None ordered   Follow-Up: At [facility], you and your health needs are our priority.  As part of our continuing mission to provide you with exceptional heart care, we have created designated Provider Care Teams.  These Care Teams include your primary Cardiologist (physician) and Advanced Practice Providers (APPs -  Physician Assistants and Nurse Practitioners) who all work together to provide you with the care you need, when you need it.  We recommend signing up for the patient portal called "MyChart".  Sign up information is provided on this After Visit Summary.  MyChart is used to connect with patients for Virtual Visits (Telemedicine).  Patients are able to view lab/test results, encounter notes, upcoming appointments, etc.  Non-urgent messages can be sent to your provider as well.   To learn more about what you can do with MyChart, go to [email or web address].    Your next appointment:   12 month(s)  The format for your next appointment:   In Person  Provider:   You may see Jayadeep Varanasi, MD or one of the following Advanced Practice Providers on your designated Care Team:    Dayna Dunn, PA-C  Michele Lenze, PA-C    Other Instructions None  

## 2020-02-05 ENCOUNTER — Ambulatory Visit (INDEPENDENT_AMBULATORY_CARE_PROVIDER_SITE_OTHER): Payer: Medicare Other | Admitting: *Deleted

## 2020-02-05 ENCOUNTER — Other Ambulatory Visit: Payer: Self-pay

## 2020-02-05 DIAGNOSIS — I4891 Unspecified atrial fibrillation: Secondary | ICD-10-CM

## 2020-02-05 DIAGNOSIS — Z5181 Encounter for therapeutic drug level monitoring: Secondary | ICD-10-CM | POA: Diagnosis not present

## 2020-02-05 LAB — POCT INR: INR: 2.5 (ref 2.0–3.0)

## 2020-02-05 NOTE — Patient Instructions (Signed)
Description   Continue taking 1 tablet daily except 1/2 tablet on Saturdays and Tuesdays. Recheck INR in 6 weeks. Call coumadin clinic [phone] with any changes in medications or upcoming procedures     

## 2020-02-13 DIAGNOSIS — Z1231 Encounter for screening mammogram for malignant neoplasm of breast: Secondary | ICD-10-CM | POA: Diagnosis not present

## 2020-02-14 DIAGNOSIS — E7800 Pure hypercholesterolemia, unspecified: Secondary | ICD-10-CM | POA: Diagnosis not present

## 2020-02-14 DIAGNOSIS — M810 Age-related osteoporosis without current pathological fracture: Secondary | ICD-10-CM | POA: Diagnosis not present

## 2020-02-14 DIAGNOSIS — Z0000 Encounter for general adult medical examination without abnormal findings: Secondary | ICD-10-CM | POA: Diagnosis not present

## 2020-02-22 DIAGNOSIS — Z0000 Encounter for general adult medical examination without abnormal findings: Secondary | ICD-10-CM | POA: Diagnosis not present

## 2020-03-10 ENCOUNTER — Ambulatory Visit (INDEPENDENT_AMBULATORY_CARE_PROVIDER_SITE_OTHER): Payer: Medicare Other

## 2020-03-10 DIAGNOSIS — I4821 Permanent atrial fibrillation: Secondary | ICD-10-CM

## 2020-03-10 LAB — CUP PACEART REMOTE DEVICE CHECK
Battery Remaining Longevity: 20 mo
Battery Remaining Percentage: 33 %
Battery Voltage: 2.83 V
Brady Statistic AP VP Percent: 25 %
Brady Statistic AP VS Percent: 1 %
Brady Statistic AS VP Percent: 75 %
Brady Statistic AS VS Percent: 1 %
Brady Statistic RA Percent Paced: 24 %
Date Time Interrogation Session: 20220207020026
Implantable Lead Implant Date: 20081223
Implantable Lead Implant Date: 20081223
Implantable Lead Implant Date: 20081223
Implantable Lead Location: 753858
Implantable Lead Location: 753859
Implantable Lead Location: 753860
Implantable Lead Model: 5024
Implantable Lead Model: 5524
Implantable Pulse Generator Implant Date: 20160526
Lead Channel Impedance Value: 1025 Ohm
Lead Channel Impedance Value: 1025 Ohm
Lead Channel Impedance Value: 460 Ohm
Lead Channel Pacing Threshold Amplitude: 1.625 V
Lead Channel Pacing Threshold Amplitude: 1.75 V
Lead Channel Pacing Threshold Amplitude: 2.125 V
Lead Channel Pacing Threshold Pulse Width: 0.5 ms
Lead Channel Pacing Threshold Pulse Width: 0.7 ms
Lead Channel Pacing Threshold Pulse Width: 0.8 ms
Lead Channel Sensing Intrinsic Amplitude: 10 mV
Lead Channel Sensing Intrinsic Amplitude: 4.2 mV
Lead Channel Setting Pacing Amplitude: 3.125
Lead Channel Setting Pacing Amplitude: 3.125
Lead Channel Setting Pacing Amplitude: 3.5 V
Lead Channel Setting Pacing Pulse Width: 0.7 ms
Lead Channel Setting Pacing Pulse Width: 0.8 ms
Lead Channel Setting Sensing Sensitivity: 4.5 mV
Pulse Gen Model: 3222
Pulse Gen Serial Number: 7738731

## 2020-03-17 DIAGNOSIS — R059 Cough, unspecified: Secondary | ICD-10-CM | POA: Diagnosis not present

## 2020-03-17 DIAGNOSIS — M810 Age-related osteoporosis without current pathological fracture: Secondary | ICD-10-CM | POA: Diagnosis not present

## 2020-03-17 DIAGNOSIS — R197 Diarrhea, unspecified: Secondary | ICD-10-CM | POA: Diagnosis not present

## 2020-03-17 DIAGNOSIS — K219 Gastro-esophageal reflux disease without esophagitis: Secondary | ICD-10-CM | POA: Diagnosis not present

## 2020-03-17 DIAGNOSIS — N390 Urinary tract infection, site not specified: Secondary | ICD-10-CM | POA: Diagnosis not present

## 2020-03-17 NOTE — Progress Notes (Signed)
Remote pacemaker transmission.   

## 2020-03-18 ENCOUNTER — Other Ambulatory Visit: Payer: Self-pay

## 2020-03-18 ENCOUNTER — Ambulatory Visit: Payer: Medicare Other

## 2020-03-18 DIAGNOSIS — Z5181 Encounter for therapeutic drug level monitoring: Secondary | ICD-10-CM

## 2020-03-18 DIAGNOSIS — I4891 Unspecified atrial fibrillation: Secondary | ICD-10-CM

## 2020-03-18 LAB — POCT INR: INR: 2.8 (ref 2.0–3.0)

## 2020-03-18 NOTE — Patient Instructions (Signed)
Description   Continue taking 1 tablet daily except 1/2 tablet on Saturdays and Tuesdays. Recheck INR in 6 weeks. Call coumadin clinic [phone] with any changes in medications or upcoming procedures     

## 2020-03-30 ENCOUNTER — Other Ambulatory Visit: Payer: Self-pay | Admitting: Interventional Cardiology

## 2020-04-30 ENCOUNTER — Other Ambulatory Visit: Payer: Self-pay

## 2020-04-30 ENCOUNTER — Ambulatory Visit (INDEPENDENT_AMBULATORY_CARE_PROVIDER_SITE_OTHER): Payer: Medicare Other | Admitting: *Deleted

## 2020-04-30 DIAGNOSIS — I4891 Unspecified atrial fibrillation: Secondary | ICD-10-CM

## 2020-04-30 DIAGNOSIS — Z5181 Encounter for therapeutic drug level monitoring: Secondary | ICD-10-CM

## 2020-04-30 LAB — POCT INR: INR: 2.7 (ref 2.0–3.0)

## 2020-04-30 NOTE — Patient Instructions (Signed)
Description   Continue taking 1 tablet daily except 1/2 tablet on Saturdays and Tuesdays. Recheck INR in 6 weeks. Call coumadin clinic [phone] with any changes in medications or upcoming procedures

## 2020-05-15 ENCOUNTER — Other Ambulatory Visit: Payer: Self-pay | Admitting: Family Medicine

## 2020-05-15 DIAGNOSIS — I7090 Unspecified atherosclerosis: Secondary | ICD-10-CM

## 2020-05-15 DIAGNOSIS — I712 Thoracic aortic aneurysm, without rupture, unspecified: Secondary | ICD-10-CM

## 2020-05-23 ENCOUNTER — Ambulatory Visit
Admission: RE | Admit: 2020-05-23 | Discharge: 2020-05-23 | Disposition: A | Discharge status: Home / Self Care | Payer: Medicare Other | Source: Ambulatory Visit | Attending: Family Medicine | Admitting: Family Medicine

## 2020-05-23 DIAGNOSIS — I712 Thoracic aortic aneurysm, without rupture, unspecified: Secondary | ICD-10-CM

## 2020-05-23 DIAGNOSIS — R918 Other nonspecific abnormal finding of lung field: Secondary | ICD-10-CM | POA: Diagnosis not present

## 2020-05-23 DIAGNOSIS — I7090 Unspecified atherosclerosis: Secondary | ICD-10-CM

## 2020-05-26 ENCOUNTER — Other Ambulatory Visit: Payer: Medicare Other

## 2020-06-09 ENCOUNTER — Ambulatory Visit (INDEPENDENT_AMBULATORY_CARE_PROVIDER_SITE_OTHER): Payer: Medicare Other

## 2020-06-09 DIAGNOSIS — I442 Atrioventricular block, complete: Secondary | ICD-10-CM | POA: Diagnosis not present

## 2020-06-10 LAB — CUP PACEART REMOTE DEVICE CHECK
Battery Remaining Longevity: 17 mo
Battery Remaining Percentage: 28 %
Battery Voltage: 2.81 V
Brady Statistic AP VP Percent: 25 %
Brady Statistic AP VS Percent: 1 %
Brady Statistic AS VP Percent: 75 %
Brady Statistic AS VS Percent: 1 %
Brady Statistic RA Percent Paced: 24 %
Date Time Interrogation Session: 20220509020012
Implantable Lead Implant Date: 20081223
Implantable Lead Implant Date: 20081223
Implantable Lead Implant Date: 20081223
Implantable Lead Location: 753858
Implantable Lead Location: 753859
Implantable Lead Location: 753860
Implantable Lead Model: 5024
Implantable Lead Model: 5524
Implantable Pulse Generator Implant Date: 20160526
Lead Channel Impedance Value: 1025 Ohm
Lead Channel Impedance Value: 1150 Ohm
Lead Channel Impedance Value: 540 Ohm
Lead Channel Pacing Threshold Amplitude: 1.75 V
Lead Channel Pacing Threshold Amplitude: 2 V
Lead Channel Pacing Threshold Amplitude: 2.625 V
Lead Channel Pacing Threshold Pulse Width: 0.5 ms
Lead Channel Pacing Threshold Pulse Width: 0.7 ms
Lead Channel Pacing Threshold Pulse Width: 0.8 ms
Lead Channel Sensing Intrinsic Amplitude: 10 mV
Lead Channel Sensing Intrinsic Amplitude: 2.5 mV
Lead Channel Setting Pacing Amplitude: 3 V
Lead Channel Setting Pacing Amplitude: 3.5 V
Lead Channel Setting Pacing Amplitude: 4.625
Lead Channel Setting Pacing Pulse Width: 0.7 ms
Lead Channel Setting Pacing Pulse Width: 0.8 ms
Lead Channel Setting Sensing Sensitivity: 4.5 mV
Pulse Gen Model: 3222
Pulse Gen Serial Number: 7738731

## 2020-06-12 ENCOUNTER — Ambulatory Visit: Payer: Medicare Other | Admitting: *Deleted

## 2020-06-12 ENCOUNTER — Other Ambulatory Visit: Payer: Self-pay

## 2020-06-12 DIAGNOSIS — Z5181 Encounter for therapeutic drug level monitoring: Secondary | ICD-10-CM

## 2020-06-12 DIAGNOSIS — I4891 Unspecified atrial fibrillation: Secondary | ICD-10-CM

## 2020-06-12 LAB — POCT INR: INR: 1.7 — AB (ref 2.0–3.0)

## 2020-06-12 NOTE — Patient Instructions (Signed)
Description   Take 1.5 tablets today, then continue taking 1 tablet daily except 1/2 tablet on Tuesdays and Saturdays. Recheck INR in 3 weeks. Call coumadin clinic [phone] with any changes in medications or upcoming procedures

## 2020-06-13 DIAGNOSIS — E7800 Pure hypercholesterolemia, unspecified: Secondary | ICD-10-CM | POA: Diagnosis not present

## 2020-06-13 DIAGNOSIS — Z9181 History of falling: Secondary | ICD-10-CM | POA: Diagnosis not present

## 2020-06-13 DIAGNOSIS — K219 Gastro-esophageal reflux disease without esophagitis: Secondary | ICD-10-CM | POA: Diagnosis not present

## 2020-06-13 DIAGNOSIS — Z8673 Personal history of transient ischemic attack (TIA), and cerebral infarction without residual deficits: Secondary | ICD-10-CM | POA: Diagnosis not present

## 2020-06-13 DIAGNOSIS — Z87440 Personal history of urinary (tract) infections: Secondary | ICD-10-CM | POA: Diagnosis not present

## 2020-06-13 DIAGNOSIS — M170 Bilateral primary osteoarthritis of knee: Secondary | ICD-10-CM | POA: Diagnosis not present

## 2020-06-13 DIAGNOSIS — M810 Age-related osteoporosis without current pathological fracture: Secondary | ICD-10-CM | POA: Diagnosis not present

## 2020-06-13 DIAGNOSIS — Z7901 Long term (current) use of anticoagulants: Secondary | ICD-10-CM | POA: Diagnosis not present

## 2020-06-13 DIAGNOSIS — I712 Thoracic aortic aneurysm, without rupture: Secondary | ICD-10-CM | POA: Diagnosis not present

## 2020-06-13 DIAGNOSIS — I495 Sick sinus syndrome: Secondary | ICD-10-CM | POA: Diagnosis not present

## 2020-06-13 DIAGNOSIS — I10 Essential (primary) hypertension: Secondary | ICD-10-CM | POA: Diagnosis not present

## 2020-06-13 DIAGNOSIS — N3281 Overactive bladder: Secondary | ICD-10-CM | POA: Diagnosis not present

## 2020-06-13 DIAGNOSIS — G20 Parkinson's disease: Secondary | ICD-10-CM | POA: Diagnosis not present

## 2020-06-13 DIAGNOSIS — I429 Cardiomyopathy, unspecified: Secondary | ICD-10-CM | POA: Diagnosis not present

## 2020-06-13 DIAGNOSIS — M24569 Contracture, unspecified knee: Secondary | ICD-10-CM | POA: Diagnosis not present

## 2020-06-13 DIAGNOSIS — H401110 Primary open-angle glaucoma, right eye, stage unspecified: Secondary | ICD-10-CM | POA: Diagnosis not present

## 2020-06-13 DIAGNOSIS — I700 Atherosclerosis of aorta: Secondary | ICD-10-CM | POA: Diagnosis not present

## 2020-06-13 DIAGNOSIS — M479 Spondylosis, unspecified: Secondary | ICD-10-CM | POA: Diagnosis not present

## 2020-06-13 DIAGNOSIS — D696 Thrombocytopenia, unspecified: Secondary | ICD-10-CM | POA: Diagnosis not present

## 2020-06-13 DIAGNOSIS — I480 Paroxysmal atrial fibrillation: Secondary | ICD-10-CM | POA: Diagnosis not present

## 2020-06-13 DIAGNOSIS — Z950 Presence of cardiac pacemaker: Secondary | ICD-10-CM | POA: Diagnosis not present

## 2020-06-26 DIAGNOSIS — H6090 Unspecified otitis externa, unspecified ear: Secondary | ICD-10-CM | POA: Diagnosis not present

## 2020-06-26 DIAGNOSIS — J019 Acute sinusitis, unspecified: Secondary | ICD-10-CM | POA: Diagnosis not present

## 2020-06-30 ENCOUNTER — Emergency Department (HOSPITAL_COMMUNITY): Payer: Medicare Other

## 2020-06-30 ENCOUNTER — Emergency Department (HOSPITAL_COMMUNITY)
Admission: EM | Admit: 2020-06-30 | Discharge: 2020-06-30 | Disposition: A | Discharge status: Home / Self Care | Payer: Medicare Other | Attending: Emergency Medicine | Admitting: Emergency Medicine

## 2020-06-30 DIAGNOSIS — R5383 Other fatigue: Secondary | ICD-10-CM | POA: Insufficient documentation

## 2020-06-30 DIAGNOSIS — F0390 Unspecified dementia without behavioral disturbance: Secondary | ICD-10-CM | POA: Diagnosis not present

## 2020-06-30 DIAGNOSIS — R404 Transient alteration of awareness: Secondary | ICD-10-CM | POA: Diagnosis not present

## 2020-06-30 DIAGNOSIS — Y92009 Unspecified place in unspecified non-institutional (private) residence as the place of occurrence of the external cause: Secondary | ICD-10-CM | POA: Insufficient documentation

## 2020-06-30 DIAGNOSIS — R0902 Hypoxemia: Secondary | ICD-10-CM | POA: Diagnosis not present

## 2020-06-30 DIAGNOSIS — I6782 Cerebral ischemia: Secondary | ICD-10-CM | POA: Diagnosis not present

## 2020-06-30 DIAGNOSIS — Z043 Encounter for examination and observation following other accident: Secondary | ICD-10-CM | POA: Diagnosis not present

## 2020-06-30 DIAGNOSIS — S199XXA Unspecified injury of neck, initial encounter: Secondary | ICD-10-CM | POA: Diagnosis not present

## 2020-06-30 DIAGNOSIS — S0990XA Unspecified injury of head, initial encounter: Secondary | ICD-10-CM | POA: Diagnosis not present

## 2020-06-30 DIAGNOSIS — Z7901 Long term (current) use of anticoagulants: Secondary | ICD-10-CM | POA: Diagnosis not present

## 2020-06-30 DIAGNOSIS — R6889 Other general symptoms and signs: Secondary | ICD-10-CM | POA: Diagnosis not present

## 2020-06-30 DIAGNOSIS — H6022 Malignant otitis externa, left ear: Secondary | ICD-10-CM | POA: Diagnosis not present

## 2020-06-30 DIAGNOSIS — N390 Urinary tract infection, site not specified: Secondary | ICD-10-CM

## 2020-06-30 DIAGNOSIS — S3993XA Unspecified injury of pelvis, initial encounter: Secondary | ICD-10-CM | POA: Diagnosis not present

## 2020-06-30 DIAGNOSIS — I639 Cerebral infarction, unspecified: Secondary | ICD-10-CM | POA: Diagnosis not present

## 2020-06-30 DIAGNOSIS — G319 Degenerative disease of nervous system, unspecified: Secondary | ICD-10-CM | POA: Diagnosis not present

## 2020-06-30 DIAGNOSIS — W19XXXA Unspecified fall, initial encounter: Secondary | ICD-10-CM | POA: Insufficient documentation

## 2020-06-30 DIAGNOSIS — K5730 Diverticulosis of large intestine without perforation or abscess without bleeding: Secondary | ICD-10-CM | POA: Diagnosis not present

## 2020-06-30 DIAGNOSIS — R42 Dizziness and giddiness: Secondary | ICD-10-CM | POA: Diagnosis not present

## 2020-06-30 DIAGNOSIS — S3991XA Unspecified injury of abdomen, initial encounter: Secondary | ICD-10-CM | POA: Diagnosis not present

## 2020-06-30 DIAGNOSIS — Z743 Need for continuous supervision: Secondary | ICD-10-CM | POA: Diagnosis not present

## 2020-06-30 DIAGNOSIS — R531 Weakness: Secondary | ICD-10-CM | POA: Diagnosis not present

## 2020-06-30 LAB — CBC WITH DIFFERENTIAL/PLATELET
Abs Immature Granulocytes: 0.04 10*3/uL (ref 0.00–0.07)
Basophils Absolute: 0.1 10*3/uL (ref 0.0–0.1)
Basophils Relative: 1 %
Eosinophils Absolute: 0.3 10*3/uL (ref 0.0–0.5)
Eosinophils Relative: 3 %
HCT: 42.4 % (ref 36.0–46.0)
Hemoglobin: 13.6 g/dL (ref 12.0–15.0)
Immature Granulocytes: 1 %
Lymphocytes Relative: 31 %
Lymphs Abs: 2.8 10*3/uL (ref 0.7–4.0)
MCH: 30 pg (ref 26.0–34.0)
MCHC: 32.1 g/dL (ref 30.0–36.0)
MCV: 93.6 fL (ref 80.0–100.0)
Monocytes Absolute: 0.9 10*3/uL (ref 0.1–1.0)
Monocytes Relative: 10 %
Neutro Abs: 4.8 10*3/uL (ref 1.7–7.7)
Neutrophils Relative %: 54 %
Platelets: 150 10*3/uL (ref 150–400)
RBC: 4.53 MIL/uL (ref 3.87–5.11)
RDW: 14.1 % (ref 11.5–15.5)
WBC: 8.9 10*3/uL (ref 4.0–10.5)
nRBC: 0 % (ref 0.0–0.2)

## 2020-06-30 LAB — COMPREHENSIVE METABOLIC PANEL
ALT: 17 U/L (ref 0–44)
AST: 31 U/L (ref 15–41)
Albumin: 3.9 g/dL (ref 3.5–5.0)
Alkaline Phosphatase: 25 U/L — ABNORMAL LOW (ref 38–126)
Anion gap: 7 (ref 5–15)
BUN: 21 mg/dL (ref 8–23)
CO2: 26 mmol/L (ref 22–32)
Calcium: 9.5 mg/dL (ref 8.9–10.3)
Chloride: 106 mmol/L (ref 98–111)
Creatinine, Ser: 0.8 mg/dL (ref 0.44–1.00)
GFR, Estimated: >60 mL/min (ref >60)
Glucose, Bld: 104 mg/dL — ABNORMAL HIGH (ref 70–99)
Potassium: 4.3 mmol/L (ref 3.5–5.1)
Sodium: 139 mmol/L (ref 135–145)
Total Bilirubin: 0.9 mg/dL (ref 0.3–1.2)
Total Protein: 7.1 g/dL (ref 6.5–8.1)

## 2020-06-30 LAB — URINALYSIS, ROUTINE W REFLEX MICROSCOPIC
Bilirubin Urine: NEGATIVE
Glucose, UA: NEGATIVE mg/dL
Hgb urine dipstick: NEGATIVE
Ketones, ur: NEGATIVE mg/dL
Nitrite: NEGATIVE
Protein, ur: 100 mg/dL — AB
Specific Gravity, Urine: 1.026 (ref 1.005–1.030)
pH: 5 (ref 5.0–8.0)

## 2020-06-30 LAB — PROTIME-INR
INR: 1.8 — ABNORMAL HIGH (ref 0.8–1.2)
Prothrombin Time: 20.9 seconds — ABNORMAL HIGH (ref 11.4–15.2)

## 2020-06-30 MED ORDER — CEPHALEXIN 500 MG PO CAPS: 500.0000 mg | ORAL_CAPSULE | Freq: Three times a day (TID) | ORAL | 0 refills | Status: AC

## 2020-06-30 MED ORDER — OFLOXACIN 0.3 % OP SOLN
5.0000 [drp] | Freq: Two times a day (BID) | OPHTHALMIC | Status: DC
  Administered 2020-06-30: 5 [drp] via OTIC
  Filled 2020-06-30: qty 5

## 2020-06-30 MED ORDER — SODIUM CHLORIDE 0.9 % IV BOLUS
1000.0000 mL | Freq: Once | INTRAVENOUS | Status: AC
  Administered 2020-06-30: 1000 mL via INTRAVENOUS

## 2020-06-30 NOTE — ED Triage Notes (Signed)
Pt BIB EMS from home for fall. Diagnosed with a sinus infection recently and balance/weakness has worsened with diagnosis. Fell at 0400 05/29, second fall at unknown time, and then could not raise self from toilet. EMS arrived at home for pt's third fall. Pt is on coumadin. C/o dizziness and sinus pressure.   EMS vitals: 158/94 HR 68, has pacemaker CBG 115 94% RA

## 2020-06-30 NOTE — Discharge Planning (Signed)
[facility] Bitner J. Clydene Laming, RN, BSN, [place] [phone] RNCM spoke with pt and daughter at bedside regarding discharge planning for [facility]. Offered pt medicare.gov list of home health agencies to choose from.  Pt chose [facility] to render services.  Patient made aware that [facility] will be in contact in 24-48 hours.

## 2020-06-30 NOTE — ED Notes (Signed)
Patient transported to CT 

## 2020-06-30 NOTE — ED Provider Notes (Signed)
[facility] EMERGENCY DEPARTMENT Provider Note  CSN: [id number] Arrival date & time: 06/30/20 0540  Chief Complaint(s) No chief complaint on file.  HPI Debra Lynn is a 87 y.o. female patient presents from home as a level 2 trauma due to fall on blood thinners.  Per family patient has been feeling fatigue over the past several weeks which worsened over the past 2 days.  She is currently on Augmentin for a left ear infection.  Daughter also reports patient has been having diarrhea.  No vomiting.  No fevers.   They do not believe the patient hit her head but given her generalized fatigue and frequent falls, they felt patient needed to be evaluated.  Patient has a history of mild dementia and is currently on her baseline mental status.  Remainder of history, ROS, and physical exam limited due to patient's condition (dementia). Additional information was obtained from EMS and family.   Level V Caveat.    HPI  Past Medical History No past medical history on file. There are no problems to display for this patient.  Home Medication(s) Prior to Admission medications   Medication Sig Start Date End Date Taking? Authorizing Provider  alendronate (FOSAMAX) 70 MG tablet Take 70 mg by mouth every Saturday at 6 PM. 06/14/20  Yes [provider]  amoxicillin-clavulanate (AUGMENTIN) 875-125 MG tablet Take 1 tablet by mouth See admin instructions. Bid x 10 days 06/26/20  Yes [provider]  benzonatate (TESSALON) 100 MG capsule Take 100 mg by mouth 3 (three) times daily as needed for cough.   Yes [provider]  Calcium Carbonate-Vitamin D (CALTRATE 600+D PO) Take 1 tablet by mouth daily.   Yes [provider]  ciprofloxacin-dexamethasone (CIPRODEX) OTIC suspension Place 4 drops into the left ear See admin instructions. Bid x 7 days 06/26/20  Yes [provider]  colestipol (COLESTID) 1 g tablet Take 1 g by mouth at bedtime.   Yes  [provider]  furosemide (LASIX) 40 MG tablet Take 40 mg by mouth daily. 06/04/20  Yes [provider]  levETIRAcetam (KEPPRA) 500 MG tablet Take 250 mg by mouth 2 (two) times daily. 04/16/20  Yes [provider]  loratadine (CLARITIN) 10 MG tablet Take 10 mg by mouth daily.   Yes [provider]  pantoprazole (PROTONIX) 40 MG tablet Take 40 mg by mouth every other day. 04/16/20  Yes [provider]  potassium chloride SA (KLOR-CON) 20 MEQ tablet Take 20 mEq by mouth at bedtime. 06/04/20  Yes [provider]  pravastatin (PRAVACHOL) 80 MG tablet Take 80 mg by mouth daily. 06/04/20  Yes [provider]  Probiotic Product (PROBIOTIC DAILY PO) Take 1 capsule by mouth daily.   Yes [provider]  propranolol (INDERAL) 60 MG tablet Take 30 mg by mouth 2 (two) times daily. 06/04/20  Yes [provider]  warfarin (COUMADIN) 2.5 MG tablet Take 1.25-2.5 mg by mouth See admin instructions. 1.25 mg Tuesday and Saturday 2.5 mg Monday,Wednesday,Thursday,Friday and sunday 01/28/20  Yes [provider]  azithromycin (ZITHROMAX) 250 MG tablet Take 250 mg by mouth as directed. Patient not taking: No sig reported 06/24/20   [provider]  Past Surgical History ** The histories are not reviewed yet. Please review them in the "History" navigator section and refresh this [place]. Family History No family history on file.  Social History   Allergies Patient has no known allergies.  Review of Systems Review of Systems Unable to obtain due to dementia  Physical Exam Vital Signs  I have reviewed the triage vital signs BP (!) 170/98   Pulse 66   Temp 98.5 F (36.9 C) (Oral)   Resp (!) 27   Ht 5\' 2"  (1.575 m)   Wt 68 kg   SpO2 98%   BMI 27.44 kg/m   Physical Exam Constitutional:       General: She is not in acute distress.    Appearance: She is well-developed. She is not diaphoretic.  HENT:     Head: Normocephalic and atraumatic.     Right Ear: External ear normal.     Left Ear: External ear normal. Drainage and swelling present.     Nose: Nose normal.  Eyes:     General: No scleral icterus.       Right eye: No discharge.        Left eye: No discharge.     Conjunctiva/sclera: Conjunctivae normal.     Pupils: Pupils are equal, round, and reactive to light.  Cardiovascular:     Rate and Rhythm: Normal rate and regular rhythm.     Pulses:          Radial pulses are 2+ on the right side and 2+ on the left side.       Dorsalis pedis pulses are 2+ on the right side and 2+ on the left side.     Heart sounds: Normal heart sounds. No murmur heard. No friction rub. No gallop.   Pulmonary:     Effort: Pulmonary effort is normal. No respiratory distress.     Breath sounds: Normal breath sounds. No stridor. No wheezing.  Abdominal:     General: There is no distension.     Palpations: Abdomen is soft.     Tenderness: There is no abdominal tenderness.  Musculoskeletal:        General: No tenderness.     Cervical back: Normal range of motion and neck supple. No bony tenderness.     Thoracic back: No bony tenderness.     Lumbar back: No bony tenderness.     Comments: Clavicles stable. Chest stable to AP/Lat compression. Pelvis stable to Lat compression. No obvious extremity deformity. No chest or abdominal wall contusion.  Skin:    General: Skin is warm and dry.     Findings: No erythema or rash.  Neurological:     Mental Status: She is alert and oriented to person, place, and time.     Comments: Moving all extremities     ED Results and Treatments Labs (all labs ordered are listed, but only abnormal results are displayed) Labs Reviewed  COMPREHENSIVE METABOLIC PANEL - Abnormal; Notable for the following components:      Result Value   Glucose, Bld 104 (*)     Alkaline Phosphatase 25 (*)    All other components within normal limits  PROTIME-INR - Abnormal; Notable for the following components:   Prothrombin Time 20.9 (*)    INR 1.8 (*)    All other components within normal limits  CBC WITH DIFFERENTIAL/PLATELET  URINALYSIS, ROUTINE W REFLEX MICROSCOPIC  EKG  EKG Interpretation  Date/Time:  Monday Jun 30 2020 05:57:07 EDT Ventricular Rate:  96 PR Interval:  156 QRS Duration: 136 QT Interval:  415 QTC Calculation: 525 R Axis:   -77 Text Interpretation: VENTRICULAR PACED RHYTHM No old tracing to compare Confirmed by Addison Lank [phone]) on 06/30/2020 7:13:36 AM      Radiology CT ABDOMEN PELVIS WO CONTRAST  Result Date: 06/30/2020 CLINICAL DATA:  Fall, on blood thinners EXAM: CT ABDOMEN AND PELVIS WITHOUT CONTRAST TECHNIQUE: Multidetector CT imaging of the abdomen and pelvis was performed following the standard protocol without IV contrast. COMPARISON:  10/21/2010 FINDINGS: Lower chest: Lung bases are clear. Hepatobiliary: 12 mm cyst in the central left hepatic lobe (series 3/image 18). Status post cholecystectomy. No intrahepatic or extrahepatic ductal dilatation. Pancreas: Within normal limits. Spleen: Normal limits. Adrenals/Urinary Tract: Adrenal glands are within normal limits. Punctate nonobstructing right lower pole renal calculus (coronal image 40). Left kidney is within normal limits. No hydronephrosis. Bladder is within normal limits. Stomach/Bowel: Stomach is within normal limits. No evidence of bowel obstruction. Appendix is not discretely visualized. Mild sigmoid diverticulosis, without evidence of diverticulitis. Vascular/Lymphatic: No evidence of abdominal aortic aneurysm. Atherosclerotic calcifications of the abdominal aorta and branch vessels. The lymph nodes Reproductive: Status post hysterectomy. No adnexal  masses. Other: No abdominopelvic ascites. No hemoperitoneum or free air. Musculoskeletal: Mild superior endplate compression fracture deformity at L2, chronic. Degenerative changes of the visualized thoracolumbar spine. No fracture is seen. IMPRESSION: No evidence of traumatic injury to the abdomen/pelvis. Mild superior endplate compression fracture deformity at L2, chronic. Additional ancillary findings as above. Electronically Signed   By: Julian Hy M.D.   On: 06/30/2020 06:48   CT Head Wo Contrast  Result Date: 06/30/2020 CLINICAL DATA:  Neck trauma.  Fall on blood thinners EXAM: CT HEAD WITHOUT CONTRAST CT CERVICAL SPINE WITHOUT CONTRAST TECHNIQUE: Multidetector CT imaging of the head and cervical spine was performed following the standard protocol without intravenous contrast. Multiplanar CT image reconstructions of the cervical spine were also generated. COMPARISON:  01/21/2015 head CT FINDINGS: CT HEAD FINDINGS Brain: No evidence of acute infarction, hemorrhage, hydrocephalus, extra-axial collection or mass lesion/mass effect. Extensive chronic small vessel ischemia in the cerebral white matter. Small remote bilateral cerebellar infarcts. Generalized cerebral volume loss. Vague low-density in the right pons is presumably from streak artifact given the traumatic history. Vascular: Atheromatous calcification. Chronic calcification at the right M2 level. Skull: Negative for fracture Sinuses/Orbits: No visible injury CT CERVICAL SPINE FINDINGS Alignment: Normal Skull base and vertebrae: No acute fracture. No primary bone lesion or focal pathologic process. Soft tissues and spinal canal: No prevertebral fluid or swelling. No visible canal hematoma. Disc levels: C3-C5 ACDF solid fusion. No acute fracture or erosion. Upper chest: Negative IMPRESSION: 1. No evidence of acute intracranial or cervical spine injury. 2. Extensive chronic small vessel ischemia and generalized brain atrophy. Electronically  Signed   By: Monte Fantasia M.D.   On: 06/30/2020 06:44   CT Cervical Spine Wo Contrast  Result Date: 06/30/2020 CLINICAL DATA:  Neck trauma.  Fall on blood thinners EXAM: CT HEAD WITHOUT CONTRAST CT CERVICAL SPINE WITHOUT CONTRAST TECHNIQUE: Multidetector CT imaging of the head and cervical spine was performed following the standard protocol without intravenous contrast. Multiplanar CT image reconstructions of the cervical spine were also generated. COMPARISON:  01/21/2015 head CT FINDINGS: CT HEAD FINDINGS Brain: No evidence of acute infarction, hemorrhage, hydrocephalus, extra-axial collection or mass lesion/mass effect. Extensive chronic small vessel ischemia in the  cerebral white matter. Small remote bilateral cerebellar infarcts. Generalized cerebral volume loss. Vague low-density in the right pons is presumably from streak artifact given the traumatic history. Vascular: Atheromatous calcification. Chronic calcification at the right M2 level. Skull: Negative for fracture Sinuses/Orbits: No visible injury CT CERVICAL SPINE FINDINGS Alignment: Normal Skull base and vertebrae: No acute fracture. No primary bone lesion or focal pathologic process. Soft tissues and spinal canal: No prevertebral fluid or swelling. No visible canal hematoma. Disc levels: C3-C5 ACDF solid fusion. No acute fracture or erosion. Upper chest: Negative IMPRESSION: 1. No evidence of acute intracranial or cervical spine injury. 2. Extensive chronic small vessel ischemia and generalized brain atrophy. Electronically Signed   By: Monte Fantasia M.D.   On: 06/30/2020 06:44    Pertinent labs & imaging results that were available during my care of the patient were reviewed by me and considered in my medical decision making (see chart for details).  Medications Ordered in ED Medications  sodium chloride 0.9 % bolus 1,000 mL (has no administration in time range)  ofloxacin (OCUFLOX) 0.3 % ophthalmic solution 5 drop (has no  administration in time range)                                                                                                                                    Procedures Procedures  (including critical care time)  Medical Decision Making / ED Course I have reviewed the nursing notes for this encounter and the patient's prior records (if available in EHR or on provided paperwork).   Aryianna B Ferdinand was evaluated in Emergency Department on 06/30/2020 for the symptoms described in the history of present illness. She was evaluated in the context of the global COVID-19 pandemic, which necessitated consideration that the patient might be at risk for infection with the SARS-CoV-2 virus that causes COVID-19. Institutional protocols and algorithms that pertain to the evaluation of patients at risk for COVID-19 are in a state of rapid change based on information released by regulatory bodies including the CDC and federal and state organizations. These policies and algorithms were followed during the patient's care in the ED.  Level 2 fall on thinners. Unwitnessed by family ABCs intact Secondary as above without external evidence of injury. Screening labs obtained and INR was subtherapeutic. CT head and cervical spine negative. Given the patient's diarrhea, CT abdomen was obtained to rule out diverticulitis/colitis, which was negative. Labs reassuring without leukocytosis or anemia. No significant electrolyte arrangement or renal insufficiency.  Ofloxacin was ordered to replace Ciprodex as it does not appear to be working. Patient will be provided with IV fluids. UA still pending.  Daughter reports that patient is in process of applying for physical therapy at home. TOC consult placed to assist.  [place] home with daughter. Patient care turned over to Dr Billy Fischer. Patient case and results discussed in detail; please see their note for further ED managment.  Final Clinical  Impression(s) / ED Diagnoses Final diagnoses:  Fatigue, unspecified type  Fall in home, initial encounter  Acute malignant otitis externa of left ear      This chart was dictated using voice recognition software.  Despite best efforts to proofread,  errors can occur which can change the documentation meaning.   Fatima Blank, MD 06/30/20 [phone]

## 2020-06-30 NOTE — ED Notes (Addendum)
Pt wanting to get up to use bathroom. This RN explained she is not stable enough on her feet to be able to walk to the restroom. Pt placed on bedpan for BM attempt

## 2020-06-30 NOTE — ED Provider Notes (Signed)
  Physical Exam  BP (!) 157/72 (BP Location: Left Arm)   Pulse 66   Temp 98.5 F (36.9 C) (Oral)   Resp 19   Ht 5\' 2"  (1.575 m)   Wt 68 kg   SpO2 99%   BMI 27.44 kg/m   Physical Exam  ED Course/Procedures     Procedures  MDM   Received care of patient from Dr. Leonette Monarch at 7 AM.  Please see his note for prior history, physical and care.  Briefly this is an 87 year old female who presented due to fall with additional concerns of fatigue and generalized weakness.  Imaging assessment for traumatic injuries has been normal, lab work without significant abnormalities.  Awaiting urinalysis, with plan for outpatient management and new referral for home health.  Urinalysis shows possible infection.  Will treat with Keflex.      Gareth Morgan, MD 07/01/20 [phone]

## 2020-07-01 ENCOUNTER — Telehealth: Payer: Self-pay

## 2020-07-01 NOTE — Progress Notes (Signed)
Remote pacemaker transmission.   

## 2020-07-01 NOTE — Telephone Encounter (Signed)
Pt's daughter calling to let us know that pt was in the ED yesterday for inner ear/balance issues. Pt was placed on Keflex and her INR was 1.8 via venipuncture yesterday. Advised her to give pt a whole tablet warfarin tonight and keep next appt on 6/2. She verbalizes understanding and will call back w/ any further questions or concerns.

## 2020-07-02 DIAGNOSIS — Z950 Presence of cardiac pacemaker: Secondary | ICD-10-CM | POA: Diagnosis not present

## 2020-07-02 DIAGNOSIS — M24569 Contracture, unspecified knee: Secondary | ICD-10-CM | POA: Diagnosis not present

## 2020-07-02 DIAGNOSIS — G20 Parkinson's disease: Secondary | ICD-10-CM | POA: Diagnosis not present

## 2020-07-02 DIAGNOSIS — I495 Sick sinus syndrome: Secondary | ICD-10-CM | POA: Diagnosis not present

## 2020-07-02 DIAGNOSIS — E7800 Pure hypercholesterolemia, unspecified: Secondary | ICD-10-CM | POA: Diagnosis not present

## 2020-07-02 DIAGNOSIS — Z87440 Personal history of urinary (tract) infections: Secondary | ICD-10-CM | POA: Diagnosis not present

## 2020-07-02 DIAGNOSIS — I429 Cardiomyopathy, unspecified: Secondary | ICD-10-CM | POA: Diagnosis not present

## 2020-07-02 DIAGNOSIS — N3281 Overactive bladder: Secondary | ICD-10-CM | POA: Diagnosis not present

## 2020-07-02 DIAGNOSIS — I700 Atherosclerosis of aorta: Secondary | ICD-10-CM | POA: Diagnosis not present

## 2020-07-02 DIAGNOSIS — Z8673 Personal history of transient ischemic attack (TIA), and cerebral infarction without residual deficits: Secondary | ICD-10-CM | POA: Diagnosis not present

## 2020-07-02 DIAGNOSIS — K219 Gastro-esophageal reflux disease without esophagitis: Secondary | ICD-10-CM | POA: Diagnosis not present

## 2020-07-02 DIAGNOSIS — D696 Thrombocytopenia, unspecified: Secondary | ICD-10-CM | POA: Diagnosis not present

## 2020-07-02 DIAGNOSIS — I712 Thoracic aortic aneurysm, without rupture: Secondary | ICD-10-CM | POA: Diagnosis not present

## 2020-07-02 DIAGNOSIS — Z7901 Long term (current) use of anticoagulants: Secondary | ICD-10-CM | POA: Diagnosis not present

## 2020-07-02 DIAGNOSIS — I10 Essential (primary) hypertension: Secondary | ICD-10-CM | POA: Diagnosis not present

## 2020-07-02 DIAGNOSIS — M479 Spondylosis, unspecified: Secondary | ICD-10-CM | POA: Diagnosis not present

## 2020-07-02 DIAGNOSIS — Z9181 History of falling: Secondary | ICD-10-CM | POA: Diagnosis not present

## 2020-07-02 DIAGNOSIS — M810 Age-related osteoporosis without current pathological fracture: Secondary | ICD-10-CM | POA: Diagnosis not present

## 2020-07-02 DIAGNOSIS — M170 Bilateral primary osteoarthritis of knee: Secondary | ICD-10-CM | POA: Diagnosis not present

## 2020-07-02 DIAGNOSIS — H401110 Primary open-angle glaucoma, right eye, stage unspecified: Secondary | ICD-10-CM | POA: Diagnosis not present

## 2020-07-02 DIAGNOSIS — I480 Paroxysmal atrial fibrillation: Secondary | ICD-10-CM | POA: Diagnosis not present

## 2020-07-03 ENCOUNTER — Telehealth: Payer: Self-pay | Admitting: Pharmacist

## 2020-07-03 DIAGNOSIS — M170 Bilateral primary osteoarthritis of knee: Secondary | ICD-10-CM | POA: Diagnosis not present

## 2020-07-03 DIAGNOSIS — Z9181 History of falling: Secondary | ICD-10-CM | POA: Diagnosis not present

## 2020-07-03 DIAGNOSIS — I480 Paroxysmal atrial fibrillation: Secondary | ICD-10-CM | POA: Diagnosis not present

## 2020-07-03 DIAGNOSIS — Z87440 Personal history of urinary (tract) infections: Secondary | ICD-10-CM | POA: Diagnosis not present

## 2020-07-03 DIAGNOSIS — Z950 Presence of cardiac pacemaker: Secondary | ICD-10-CM | POA: Diagnosis not present

## 2020-07-03 DIAGNOSIS — Z8673 Personal history of transient ischemic attack (TIA), and cerebral infarction without residual deficits: Secondary | ICD-10-CM | POA: Diagnosis not present

## 2020-07-03 DIAGNOSIS — I10 Essential (primary) hypertension: Secondary | ICD-10-CM | POA: Diagnosis not present

## 2020-07-03 DIAGNOSIS — N3281 Overactive bladder: Secondary | ICD-10-CM | POA: Diagnosis not present

## 2020-07-03 DIAGNOSIS — I700 Atherosclerosis of aorta: Secondary | ICD-10-CM | POA: Diagnosis not present

## 2020-07-03 DIAGNOSIS — M810 Age-related osteoporosis without current pathological fracture: Secondary | ICD-10-CM | POA: Diagnosis not present

## 2020-07-03 DIAGNOSIS — G20 Parkinson's disease: Secondary | ICD-10-CM | POA: Diagnosis not present

## 2020-07-03 DIAGNOSIS — I429 Cardiomyopathy, unspecified: Secondary | ICD-10-CM | POA: Diagnosis not present

## 2020-07-03 DIAGNOSIS — M24569 Contracture, unspecified knee: Secondary | ICD-10-CM | POA: Diagnosis not present

## 2020-07-03 DIAGNOSIS — I712 Thoracic aortic aneurysm, without rupture: Secondary | ICD-10-CM | POA: Diagnosis not present

## 2020-07-03 DIAGNOSIS — Z7901 Long term (current) use of anticoagulants: Secondary | ICD-10-CM | POA: Diagnosis not present

## 2020-07-03 DIAGNOSIS — E7800 Pure hypercholesterolemia, unspecified: Secondary | ICD-10-CM | POA: Diagnosis not present

## 2020-07-03 DIAGNOSIS — H401110 Primary open-angle glaucoma, right eye, stage unspecified: Secondary | ICD-10-CM | POA: Diagnosis not present

## 2020-07-03 DIAGNOSIS — I495 Sick sinus syndrome: Secondary | ICD-10-CM | POA: Diagnosis not present

## 2020-07-03 DIAGNOSIS — K219 Gastro-esophageal reflux disease without esophagitis: Secondary | ICD-10-CM | POA: Diagnosis not present

## 2020-07-03 DIAGNOSIS — M479 Spondylosis, unspecified: Secondary | ICD-10-CM | POA: Diagnosis not present

## 2020-07-03 DIAGNOSIS — D696 Thrombocytopenia, unspecified: Secondary | ICD-10-CM | POA: Diagnosis not present

## 2020-07-03 NOTE — Telephone Encounter (Signed)
Patients daughter called stating her mom refuses to come to apt today. States she has an apt with PCP tomorrow. Advised she can check if PCP will check INR tomorrow and send [place] results. I have rescheduled patient for next week in our clinic as well.

## 2020-07-04 ENCOUNTER — Ambulatory Visit (INDEPENDENT_AMBULATORY_CARE_PROVIDER_SITE_OTHER): Payer: Medicare Other | Admitting: Pharmacist

## 2020-07-04 DIAGNOSIS — N390 Urinary tract infection, site not specified: Secondary | ICD-10-CM | POA: Diagnosis not present

## 2020-07-04 DIAGNOSIS — I4891 Unspecified atrial fibrillation: Secondary | ICD-10-CM

## 2020-07-04 DIAGNOSIS — R296 Repeated falls: Secondary | ICD-10-CM | POA: Diagnosis not present

## 2020-07-04 DIAGNOSIS — Z7901 Long term (current) use of anticoagulants: Secondary | ICD-10-CM | POA: Diagnosis not present

## 2020-07-04 DIAGNOSIS — I480 Paroxysmal atrial fibrillation: Secondary | ICD-10-CM | POA: Diagnosis not present

## 2020-07-04 LAB — POCT INR: INR: 2.5 (ref 2.0–3.0)

## 2020-07-04 NOTE — Patient Instructions (Signed)
Description   Continue taking 1 tablet daily except 1/2 tablet on Tuesdays and Saturdays. Recheck INR in 3 weeks. Call coumadin clinic [phone] with any changes in medications or upcoming procedures

## 2020-07-07 DIAGNOSIS — E7800 Pure hypercholesterolemia, unspecified: Secondary | ICD-10-CM | POA: Diagnosis not present

## 2020-07-07 DIAGNOSIS — M479 Spondylosis, unspecified: Secondary | ICD-10-CM | POA: Diagnosis not present

## 2020-07-07 DIAGNOSIS — Z7901 Long term (current) use of anticoagulants: Secondary | ICD-10-CM | POA: Diagnosis not present

## 2020-07-07 DIAGNOSIS — N3281 Overactive bladder: Secondary | ICD-10-CM | POA: Diagnosis not present

## 2020-07-07 DIAGNOSIS — G20 Parkinson's disease: Secondary | ICD-10-CM | POA: Diagnosis not present

## 2020-07-07 DIAGNOSIS — Z950 Presence of cardiac pacemaker: Secondary | ICD-10-CM | POA: Diagnosis not present

## 2020-07-07 DIAGNOSIS — D696 Thrombocytopenia, unspecified: Secondary | ICD-10-CM | POA: Diagnosis not present

## 2020-07-07 DIAGNOSIS — M810 Age-related osteoporosis without current pathological fracture: Secondary | ICD-10-CM | POA: Diagnosis not present

## 2020-07-07 DIAGNOSIS — I10 Essential (primary) hypertension: Secondary | ICD-10-CM | POA: Diagnosis not present

## 2020-07-07 DIAGNOSIS — H401110 Primary open-angle glaucoma, right eye, stage unspecified: Secondary | ICD-10-CM | POA: Diagnosis not present

## 2020-07-07 DIAGNOSIS — I480 Paroxysmal atrial fibrillation: Secondary | ICD-10-CM | POA: Diagnosis not present

## 2020-07-07 DIAGNOSIS — M170 Bilateral primary osteoarthritis of knee: Secondary | ICD-10-CM | POA: Diagnosis not present

## 2020-07-07 DIAGNOSIS — I495 Sick sinus syndrome: Secondary | ICD-10-CM | POA: Diagnosis not present

## 2020-07-07 DIAGNOSIS — Z87440 Personal history of urinary (tract) infections: Secondary | ICD-10-CM | POA: Diagnosis not present

## 2020-07-07 DIAGNOSIS — I700 Atherosclerosis of aorta: Secondary | ICD-10-CM | POA: Diagnosis not present

## 2020-07-07 DIAGNOSIS — I712 Thoracic aortic aneurysm, without rupture: Secondary | ICD-10-CM | POA: Diagnosis not present

## 2020-07-07 DIAGNOSIS — M24569 Contracture, unspecified knee: Secondary | ICD-10-CM | POA: Diagnosis not present

## 2020-07-07 DIAGNOSIS — Z8673 Personal history of transient ischemic attack (TIA), and cerebral infarction without residual deficits: Secondary | ICD-10-CM | POA: Diagnosis not present

## 2020-07-07 DIAGNOSIS — Z9181 History of falling: Secondary | ICD-10-CM | POA: Diagnosis not present

## 2020-07-07 DIAGNOSIS — K219 Gastro-esophageal reflux disease without esophagitis: Secondary | ICD-10-CM | POA: Diagnosis not present

## 2020-07-07 DIAGNOSIS — I429 Cardiomyopathy, unspecified: Secondary | ICD-10-CM | POA: Diagnosis not present

## 2020-07-09 DIAGNOSIS — I10 Essential (primary) hypertension: Secondary | ICD-10-CM | POA: Diagnosis not present

## 2020-07-09 DIAGNOSIS — I700 Atherosclerosis of aorta: Secondary | ICD-10-CM | POA: Diagnosis not present

## 2020-07-09 DIAGNOSIS — N3281 Overactive bladder: Secondary | ICD-10-CM | POA: Diagnosis not present

## 2020-07-09 DIAGNOSIS — H401110 Primary open-angle glaucoma, right eye, stage unspecified: Secondary | ICD-10-CM | POA: Diagnosis not present

## 2020-07-09 DIAGNOSIS — I480 Paroxysmal atrial fibrillation: Secondary | ICD-10-CM | POA: Diagnosis not present

## 2020-07-09 DIAGNOSIS — I429 Cardiomyopathy, unspecified: Secondary | ICD-10-CM | POA: Diagnosis not present

## 2020-07-09 DIAGNOSIS — M810 Age-related osteoporosis without current pathological fracture: Secondary | ICD-10-CM | POA: Diagnosis not present

## 2020-07-09 DIAGNOSIS — I712 Thoracic aortic aneurysm, without rupture: Secondary | ICD-10-CM | POA: Diagnosis not present

## 2020-07-09 DIAGNOSIS — Z87440 Personal history of urinary (tract) infections: Secondary | ICD-10-CM | POA: Diagnosis not present

## 2020-07-09 DIAGNOSIS — Z950 Presence of cardiac pacemaker: Secondary | ICD-10-CM | POA: Diagnosis not present

## 2020-07-09 DIAGNOSIS — K219 Gastro-esophageal reflux disease without esophagitis: Secondary | ICD-10-CM | POA: Diagnosis not present

## 2020-07-09 DIAGNOSIS — G20 Parkinson's disease: Secondary | ICD-10-CM | POA: Diagnosis not present

## 2020-07-09 DIAGNOSIS — M24569 Contracture, unspecified knee: Secondary | ICD-10-CM | POA: Diagnosis not present

## 2020-07-09 DIAGNOSIS — Z9181 History of falling: Secondary | ICD-10-CM | POA: Diagnosis not present

## 2020-07-09 DIAGNOSIS — Z8673 Personal history of transient ischemic attack (TIA), and cerebral infarction without residual deficits: Secondary | ICD-10-CM | POA: Diagnosis not present

## 2020-07-09 DIAGNOSIS — Z7901 Long term (current) use of anticoagulants: Secondary | ICD-10-CM | POA: Diagnosis not present

## 2020-07-09 DIAGNOSIS — D696 Thrombocytopenia, unspecified: Secondary | ICD-10-CM | POA: Diagnosis not present

## 2020-07-09 DIAGNOSIS — M170 Bilateral primary osteoarthritis of knee: Secondary | ICD-10-CM | POA: Diagnosis not present

## 2020-07-09 DIAGNOSIS — I495 Sick sinus syndrome: Secondary | ICD-10-CM | POA: Diagnosis not present

## 2020-07-09 DIAGNOSIS — M479 Spondylosis, unspecified: Secondary | ICD-10-CM | POA: Diagnosis not present

## 2020-07-09 DIAGNOSIS — E7800 Pure hypercholesterolemia, unspecified: Secondary | ICD-10-CM | POA: Diagnosis not present

## 2020-07-14 DIAGNOSIS — Z87440 Personal history of urinary (tract) infections: Secondary | ICD-10-CM | POA: Diagnosis not present

## 2020-07-14 DIAGNOSIS — Z9181 History of falling: Secondary | ICD-10-CM | POA: Diagnosis not present

## 2020-07-14 DIAGNOSIS — I495 Sick sinus syndrome: Secondary | ICD-10-CM | POA: Diagnosis not present

## 2020-07-14 DIAGNOSIS — D696 Thrombocytopenia, unspecified: Secondary | ICD-10-CM | POA: Diagnosis not present

## 2020-07-14 DIAGNOSIS — Z950 Presence of cardiac pacemaker: Secondary | ICD-10-CM | POA: Diagnosis not present

## 2020-07-14 DIAGNOSIS — N3281 Overactive bladder: Secondary | ICD-10-CM | POA: Diagnosis not present

## 2020-07-14 DIAGNOSIS — I712 Thoracic aortic aneurysm, without rupture: Secondary | ICD-10-CM | POA: Diagnosis not present

## 2020-07-14 DIAGNOSIS — M170 Bilateral primary osteoarthritis of knee: Secondary | ICD-10-CM | POA: Diagnosis not present

## 2020-07-14 DIAGNOSIS — Z8673 Personal history of transient ischemic attack (TIA), and cerebral infarction without residual deficits: Secondary | ICD-10-CM | POA: Diagnosis not present

## 2020-07-14 DIAGNOSIS — I10 Essential (primary) hypertension: Secondary | ICD-10-CM | POA: Diagnosis not present

## 2020-07-14 DIAGNOSIS — M24569 Contracture, unspecified knee: Secondary | ICD-10-CM | POA: Diagnosis not present

## 2020-07-14 DIAGNOSIS — G20 Parkinson's disease: Secondary | ICD-10-CM | POA: Diagnosis not present

## 2020-07-14 DIAGNOSIS — I429 Cardiomyopathy, unspecified: Secondary | ICD-10-CM | POA: Diagnosis not present

## 2020-07-14 DIAGNOSIS — I480 Paroxysmal atrial fibrillation: Secondary | ICD-10-CM | POA: Diagnosis not present

## 2020-07-14 DIAGNOSIS — M479 Spondylosis, unspecified: Secondary | ICD-10-CM | POA: Diagnosis not present

## 2020-07-14 DIAGNOSIS — E7800 Pure hypercholesterolemia, unspecified: Secondary | ICD-10-CM | POA: Diagnosis not present

## 2020-07-14 DIAGNOSIS — Z7901 Long term (current) use of anticoagulants: Secondary | ICD-10-CM | POA: Diagnosis not present

## 2020-07-14 DIAGNOSIS — K219 Gastro-esophageal reflux disease without esophagitis: Secondary | ICD-10-CM | POA: Diagnosis not present

## 2020-07-14 DIAGNOSIS — H401110 Primary open-angle glaucoma, right eye, stage unspecified: Secondary | ICD-10-CM | POA: Diagnosis not present

## 2020-07-14 DIAGNOSIS — M810 Age-related osteoporosis without current pathological fracture: Secondary | ICD-10-CM | POA: Diagnosis not present

## 2020-07-14 DIAGNOSIS — I700 Atherosclerosis of aorta: Secondary | ICD-10-CM | POA: Diagnosis not present

## 2020-07-16 DIAGNOSIS — M479 Spondylosis, unspecified: Secondary | ICD-10-CM | POA: Diagnosis not present

## 2020-07-16 DIAGNOSIS — Z7901 Long term (current) use of anticoagulants: Secondary | ICD-10-CM | POA: Diagnosis not present

## 2020-07-16 DIAGNOSIS — I429 Cardiomyopathy, unspecified: Secondary | ICD-10-CM | POA: Diagnosis not present

## 2020-07-16 DIAGNOSIS — H401110 Primary open-angle glaucoma, right eye, stage unspecified: Secondary | ICD-10-CM | POA: Diagnosis not present

## 2020-07-16 DIAGNOSIS — K219 Gastro-esophageal reflux disease without esophagitis: Secondary | ICD-10-CM | POA: Diagnosis not present

## 2020-07-16 DIAGNOSIS — Z87440 Personal history of urinary (tract) infections: Secondary | ICD-10-CM | POA: Diagnosis not present

## 2020-07-16 DIAGNOSIS — Z950 Presence of cardiac pacemaker: Secondary | ICD-10-CM | POA: Diagnosis not present

## 2020-07-16 DIAGNOSIS — M24569 Contracture, unspecified knee: Secondary | ICD-10-CM | POA: Diagnosis not present

## 2020-07-16 DIAGNOSIS — N3281 Overactive bladder: Secondary | ICD-10-CM | POA: Diagnosis not present

## 2020-07-16 DIAGNOSIS — I495 Sick sinus syndrome: Secondary | ICD-10-CM | POA: Diagnosis not present

## 2020-07-16 DIAGNOSIS — Z8673 Personal history of transient ischemic attack (TIA), and cerebral infarction without residual deficits: Secondary | ICD-10-CM | POA: Diagnosis not present

## 2020-07-16 DIAGNOSIS — I700 Atherosclerosis of aorta: Secondary | ICD-10-CM | POA: Diagnosis not present

## 2020-07-16 DIAGNOSIS — E7800 Pure hypercholesterolemia, unspecified: Secondary | ICD-10-CM | POA: Diagnosis not present

## 2020-07-16 DIAGNOSIS — I480 Paroxysmal atrial fibrillation: Secondary | ICD-10-CM | POA: Diagnosis not present

## 2020-07-16 DIAGNOSIS — D696 Thrombocytopenia, unspecified: Secondary | ICD-10-CM | POA: Diagnosis not present

## 2020-07-16 DIAGNOSIS — M170 Bilateral primary osteoarthritis of knee: Secondary | ICD-10-CM | POA: Diagnosis not present

## 2020-07-16 DIAGNOSIS — I712 Thoracic aortic aneurysm, without rupture: Secondary | ICD-10-CM | POA: Diagnosis not present

## 2020-07-16 DIAGNOSIS — I10 Essential (primary) hypertension: Secondary | ICD-10-CM | POA: Diagnosis not present

## 2020-07-16 DIAGNOSIS — Z9181 History of falling: Secondary | ICD-10-CM | POA: Diagnosis not present

## 2020-07-16 DIAGNOSIS — M810 Age-related osteoporosis without current pathological fracture: Secondary | ICD-10-CM | POA: Diagnosis not present

## 2020-07-16 DIAGNOSIS — G20 Parkinson's disease: Secondary | ICD-10-CM | POA: Diagnosis not present

## 2020-07-23 ENCOUNTER — Telehealth: Payer: Self-pay

## 2020-07-23 NOTE — Telephone Encounter (Signed)
Alert for long AT/AF episode; AF burden 1.9%; EGMs suggest A Fib - ongoing; V rates controlled per histogram. History of AF and prescribed warfarin. Routing to triage for ongoing.  Est time to ERI = 3.4 months   Pt has known history of PAF, on Coumadin.  Battery life has decreased drastically compared to last transmission on 06/09/20 when battery life was 1 year 5 months.  Pt is not historically dependant.    Contacted pt daughter, Debra Lynn (DPR on file).  She reports pt went on Hospice care as of yesterday.  Daughter is concerned about why pt has not been seen by Dr. Rayann Heman in so long.   Scheduled pt to be seen by Oda Kilts, PA on 08/06/20.   Also updated remote schedule to monthly due to rapidly depleting battery.

## 2020-07-24 ENCOUNTER — Ambulatory Visit (INDEPENDENT_AMBULATORY_CARE_PROVIDER_SITE_OTHER): Payer: Medicare Other | Admitting: Pharmacist

## 2020-07-24 DIAGNOSIS — I4821 Permanent atrial fibrillation: Secondary | ICD-10-CM

## 2020-07-24 DIAGNOSIS — Z7901 Long term (current) use of anticoagulants: Secondary | ICD-10-CM

## 2020-07-24 LAB — POCT INR: INR: 2.8 (ref 2.0–3.0)

## 2020-07-31 ENCOUNTER — Ambulatory Visit (INDEPENDENT_AMBULATORY_CARE_PROVIDER_SITE_OTHER): Payer: Medicare Other

## 2020-07-31 DIAGNOSIS — I4821 Permanent atrial fibrillation: Secondary | ICD-10-CM

## 2020-07-31 LAB — POCT INR: INR: 2.1 (ref 2.0–3.0)

## 2020-07-31 NOTE — Patient Instructions (Signed)
Description   Spoke with Ria Comment, [facility] RN advised pt to continue taking 1 tablet daily except 1/2 tablet on Tuesdays and Saturdays. Recheck INR in 2 weeks. Call coumadin clinic [phone] with any changes in medications or upcoming procedures

## 2020-08-05 NOTE — Progress Notes (Signed)
Electrophysiology Office Note Date: 08/06/2020  ID:  Debra Lynn, DOB 02-01-1929, MRN [id number]  PCP: Kathyrn Lass, MD Primary Cardiologist: Larae Grooms, MD Electrophysiologist: Thompson Grayer, MD   CC: Pacemaker follow-up  Debra Lynn is a 87 y.o. female seen today for Thompson Grayer, MD for routine electrophysiology followup.  Since last being seen in our clinic the patients daughter reports she has been doing poorly. She enrolled in Hospice care last month. Her husband passed away this past weekend and they are dealing with that lost. They were supposed to enter care together. The patient has a dry cough that has been chronic over the past several weeks. No fever chills or sputum.  She is not very engaged in our conversation.   Device History: St. Jude dual chamber PPM implanted 1992 for CHB; gen change 2004, update to STJ CRTP 2008; gen change 2016  Past Medical History:  Diagnosis Date   Acute ischemic stroke ([place]) 08/23/2012   Acute, but ill-defined, cerebrovascular disease 11/08/2012   Arthritis     Atrial fibrillation ([place])    a. s/p AVN ablation   Cancer ([place]) 12/2010   breast with recurrence s/p recent XRT   Complete heart block ([place])    a. s/p STJ CRTP   Compression fracture         CVA 05/07/2008       Fibrocystic breast    GERD (gastroesophageal reflux disease)    Glaucoma    Hyperlipidemia    Hypertension    Orthostatic hypotension    Overactive bladder    Parkinsonism ([place])    Pericarditis        Renal neoplasm 10/03/10   TUMOR ON RIGHT KIDNEY-OBSERVING   Seasonal allergic rhinitis    Pos Skin Test 07-05-07   Seizures ([place]) 2013       Thoracic aneurysm without mention of rupture    Past Surgical History:  Procedure Laterality Date   ABDOMINAL HYSTERECTOMY  1975   pt. unsure if laparascopic/vaginal/abdominal   ABLATION  1980s   AVN ablation    APPENDECTOMY     BI-VENTRICULAR PACEMAKER UPGRADE  01/2007   3rd device, upgraded to SJM Frontier II  BiV pacemaker by Dr Leonia Reeves   BREAST SURGERY Right 01/15/11   mastectomy   CATARACT EXTRACTION Bilateral    CHOLECYSTECTOMY     EP IMPLANTABLE DEVICE N/A 06/27/2014   CRT-P  (SJM) gen change by Dr Rayann Heman   EYE SURGERY     For glaucoma   FLEXIBLE BRONCHOSCOPY W/ UPPER ENDOSCOPY     and swallowing evaluation   KNEE ARTHROSCOPY Right    Arthroscopic for torn meniscus   MASS EXCISION Right 01/19/2013   Procedure: EXCISION CHEST WALL MASS;  Surgeon: [name], MD;  Location: [facility];  Service: General;  Laterality: Right;   PACEMAKER INSERTION     1992; gen change 2004   VESICOVAGINAL FISTULA CLOSURE W/ TAH      Current Outpatient Medications  Medication Sig Dispense Refill   acetaminophen (TYLENOL) 500 MG tablet Take 1,000 mg by mouth every 6 (six) hours as needed for moderate pain (pain).      alendronate (FOSAMAX) 70 MG tablet Take 70 mg by mouth once a week. Take with a full glass of water on an empty stomach.     alendronate (FOSAMAX) 70 MG tablet Take 70 mg by mouth every Saturday at 6 PM. (Patient not taking: Reported on 08/06/2020)     benzonatate (TESSALON) 100 MG capsule  TAKE 1 CAPSULE BY MOUTH 3 TIMES A DAY AS NEEDED FOR COUGH 90 capsule 3   benzonatate (TESSALON) 100 MG capsule Take 100 mg by mouth 3 (three) times daily as needed for cough.     Calcium Carbonate-Vitamin D (CALCIUM-VITAMIN D) 500-200 MG-UNIT per tablet Take 1 tablet by mouth daily.     Calcium Carbonate-Vitamin D (CALTRATE 600+D PO) Take 1 tablet by mouth daily.     cephALEXin (KEFLEX) 250 MG capsule Take 250 mg by mouth at bedtime.     ciprofloxacin-dexamethasone (CIPRODEX) OTIC suspension Place 4 drops into the left ear See admin instructions. Bid x 7 days (Patient not taking: Reported on 08/06/2020)     colestipol (COLESTID) 1 G tablet Take 1 g by mouth at bedtime.     colestipol (COLESTID) 1 g tablet Take 1 g by mouth at bedtime.     furosemide (LASIX) 40 MG tablet TAKE 1 TABLET BY MOUTH IN  THE MORNING  90 tablet 3   furosemide (LASIX) 40 MG tablet Take 40 mg by mouth daily.     levETIRAcetam (KEPPRA) 250 MG tablet Take 250 mg by mouth 2 (two) times daily. (Patient not taking: Reported on 08/06/2020)     levETIRAcetam (KEPPRA) 500 MG tablet Take 250 mg by mouth 2 (two) times daily.     loratadine (CLARITIN) 10 MG tablet Take 10 mg by mouth daily.     Multiple Vitamin (MULTIVITAMIN WITH MINERALS) TABS tablet Take 1 tablet by mouth daily.     pantoprazole (PROTONIX) 40 MG tablet Take 1 tablet by mouth daily.     pantoprazole (PROTONIX) 40 MG tablet Take 40 mg by mouth every other day. (Patient not taking: Reported on 08/06/2020)     potassium chloride SA (KLOR-CON) 20 MEQ tablet TAKE 1 TABLET BY MOUTH  DAILY (Patient not taking: Reported on 08/06/2020) 90 tablet 3   potassium chloride SA (KLOR-CON) 20 MEQ tablet Take 20 mEq by mouth at bedtime.     pravastatin (PRAVACHOL) 80 MG tablet TAKE 1 TABLET BY MOUTH  DAILY (Patient not taking: Reported on 08/06/2020) 90 tablet 3   pravastatin (PRAVACHOL) 80 MG tablet Take 80 mg by mouth daily.     Probiotic Product (ALIGN) 4 MG CAPS Take 4 mg by mouth daily. 30 capsule 0   Probiotic Product (PROBIOTIC DAILY PO) Take 1 capsule by mouth daily.     propranolol (INDERAL) 60 MG tablet TAKE ONE-HALF TABLET BY  MOUTH TWICE DAILY (Patient not taking: Reported on 08/06/2020) 90 tablet 3   propranolol (INDERAL) 60 MG tablet Take 30 mg by mouth 2 (two) times daily.     warfarin (COUMADIN) 2.5 MG tablet TAKE 1/2 TO 1 TABLET BY  MOUTH DAILY AS DIRECTED BY  THE COUMADIN CLINIC (Patient not taking: Reported on 08/06/2020) 90 tablet 3   warfarin (COUMADIN) 2.5 MG tablet Take 1.25-2.5 mg by mouth See admin instructions. 1.25 mg Tuesday and Saturday 2.5 mg Monday,Wednesday,Thursday,Friday and sunday     No current facility-administered medications for this visit.    Allergies:   Patient has no known allergies.   Social History: Social History   Socioeconomic History   Marital  status: Married    Spouse name: Not on file   Number of children: 2   Years of education: Not on file   Highest education level: Not on file  Occupational History   Occupation: retired Secretary/administrator  Tobacco Use   Smoking status: Never   Smokeless tobacco: Never  Vaping  Use   Vaping Use: Never used  Substance and Sexual Activity   Alcohol use: No   Drug use: No   Sexual activity: Not on file  Other Topics Concern   Not on file  Social History Narrative   Not on file   Social Determinants of Health   Financial Resource Strain: Not on file  Food Insecurity: Not on file  Transportation Needs: Not on file  Physical Activity: Not on file  Stress: Not on file  Social Connections: Not on file  Intimate Partner Violence: Not on file    Family History: Family History  Problem Relation Age of Onset   Heart failure Mother    Cancer Father        brain   Asthma Brother    Asthma Daughter    Heart disease Unknown    Cancer Unknown    Lymphoma Brother    Diabetes Brother    Hypertension Brother    Heart attack Brother      Review of Systems: All other systems reviewed and are otherwise negative except as noted above.  Physical Exam: Vitals:   08/06/20 1226  BP: 114/76  Pulse: 69  SpO2: 95%  Weight: 126 lb (57.2 kg)  Height: 5\' 3"  (1.6 m)     GEN- The patient is well appearing, alert and oriented x 3 today.   HEENT: normocephalic, atraumatic; sclera clear, conjunctiva pink; hearing intact; oropharynx clear; neck supple  Lungs- Clear to ausculation bilaterally, normal work of breathing.  No wheezes, rales, rhonchi Heart- Regular rate and rhythm, no murmurs, rubs or gallops  GI- soft, non-tender, non-distended, bowel sounds present  Extremities- no clubbing or cyanosis. No edema MS- no significant deformity or atrophy Skin- warm and dry, no rash or lesion; PPM pocket well healed Psych- euthymic mood, full affect Neuro- strength and sensation are intact  PPM  Interrogation- reviewed in detail today,  See PACEART report  EKG:  EKG is not ordered today. The ekg ordered 07/01/20 shows V paced at 96 bpm  Recent Labs: 06/30/2020: ALT 17; BUN 21; Creatinine, Ser 0.80; Hemoglobin 13.6; Platelets 150; Potassium 4.3; Sodium 139   Wt Readings from Last 3 Encounters:  08/06/20 126 lb (57.2 kg)  06/30/20 150 lb (68 kg)  01/16/20 141 lb (64 kg)     Other studies Reviewed: Additional studies/ records that were reviewed today include: Previous EP office notes, Previous remote checks, Most recent labwork.   Assessment and Plan:  1.  CHB s/p St. Jude PPM Normal PPM function 11 months at least to ERI, Likely longer. St Jude posits discrepancy is due to auto-capture failing to appropriately measure and basing measurements for battery longevity off of falsely elevated capture threshold. Would recommend turning off auto-capture at next visit.  Can also consider going LV pacing only by adjusting thresholds.   Additionally with only intermittent A pacing, could consider VDD or similar to eliminate A pacing to further prolong battery life.  See PaceArt report No changes today. Chronically elevated RV threshold 1.5-1.75V @ 0.7 ms    2. Chronic systolic CHF EF normalized post CRT and remains so by most recent echo 11/2018 with LVEF 55-60%     3. HTN Stable on current regiment. .    4. Atrial fibrillation/A flutter She is on coumadin for CHA2DS2/VASc of at least 5.  Burden 2.2% by device interrogation Asymptomatic.    She is NOT device dependent today with HRs in upper 30s lower 40s without pacing. She is  enrolled in hospice care and it is not felt she will not need a gen change.  In the event that she does, we can re-assess her prognosis and device dependency at that time. Daughter present today would plan to not proceed with gen change if felt appropriate.  Current medicines are reviewed at length with the patient today.   The patient does not have  concerns regarding her medicines.  The following changes were made today:  none  Labs/ tests ordered today include:  Orders Placed This Encounter  Procedures   CUP [place]    Disposition:   Follow up with EP APP in 6 Months    Signed, Shirley Friar, PA-C  08/06/2020 1:51 PM  [place] [address] [phone] (office) [phone] (fax)

## 2020-08-06 ENCOUNTER — Ambulatory Visit (INDEPENDENT_AMBULATORY_CARE_PROVIDER_SITE_OTHER): Admitting: Student

## 2020-08-06 ENCOUNTER — Encounter: Payer: Self-pay | Admitting: Student

## 2020-08-06 ENCOUNTER — Other Ambulatory Visit: Payer: Self-pay

## 2020-08-06 VITALS — BP 114/76 | HR 69 | Ht 63.0 in | Wt 126.0 lb

## 2020-08-06 DIAGNOSIS — I4821 Permanent atrial fibrillation: Secondary | ICD-10-CM | POA: Diagnosis not present

## 2020-08-06 DIAGNOSIS — I442 Atrioventricular block, complete: Secondary | ICD-10-CM

## 2020-08-06 DIAGNOSIS — I5022 Chronic systolic (congestive) heart failure: Secondary | ICD-10-CM

## 2020-08-06 DIAGNOSIS — Z7901 Long term (current) use of anticoagulants: Secondary | ICD-10-CM

## 2020-08-06 DIAGNOSIS — I10 Essential (primary) hypertension: Secondary | ICD-10-CM

## 2020-08-06 DIAGNOSIS — Z950 Presence of cardiac pacemaker: Secondary | ICD-10-CM | POA: Diagnosis not present

## 2020-08-06 LAB — CUP PACEART INCLINIC DEVICE CHECK
Battery Remaining Longevity: 11 mo
Battery Voltage: 2.77 V
Brady Statistic RA Percent Paced: 23 %
Brady Statistic RV Percent Paced: 99.84 %
Date Time Interrogation Session: 20220706131435
Implantable Lead Implant Date: 20081223
Implantable Lead Implant Date: 20081223
Implantable Lead Implant Date: 20081223
Implantable Lead Location: 753858
Implantable Lead Location: 753859
Implantable Lead Location: 753860
Implantable Lead Model: 5024
Implantable Lead Model: 5524
Implantable Pulse Generator Implant Date: 20160526
Lead Channel Impedance Value: 1037.5 Ohm
Lead Channel Impedance Value: 1212.5 Ohm
Lead Channel Impedance Value: 575 Ohm
Lead Channel Pacing Threshold Amplitude: 0.5 V
Lead Channel Pacing Threshold Amplitude: 0.5 V
Lead Channel Pacing Threshold Amplitude: 1.75 V
Lead Channel Pacing Threshold Amplitude: 1.75 V
Lead Channel Pacing Threshold Amplitude: 2 V
Lead Channel Pacing Threshold Pulse Width: 0.5 ms
Lead Channel Pacing Threshold Pulse Width: 0.5 ms
Lead Channel Pacing Threshold Pulse Width: 0.7 ms
Lead Channel Pacing Threshold Pulse Width: 0.7 ms
Lead Channel Pacing Threshold Pulse Width: 0.8 ms
Lead Channel Sensing Intrinsic Amplitude: 4.7 mV
Lead Channel Sensing Intrinsic Amplitude: 7.3 mV
Lead Channel Setting Pacing Amplitude: 1.5 V
Lead Channel Setting Pacing Amplitude: 3 V
Lead Channel Setting Pacing Amplitude: 3.5 V
Lead Channel Setting Pacing Pulse Width: 0.7 ms
Lead Channel Setting Pacing Pulse Width: 0.8 ms
Lead Channel Setting Sensing Sensitivity: 4.5 mV
Pulse Gen Model: 3222
Pulse Gen Serial Number: 7738731

## 2020-08-06 NOTE — Patient Instructions (Signed)

## 2020-08-14 ENCOUNTER — Ambulatory Visit (INDEPENDENT_AMBULATORY_CARE_PROVIDER_SITE_OTHER): Admitting: *Deleted

## 2020-08-14 DIAGNOSIS — Z5181 Encounter for therapeutic drug level monitoring: Secondary | ICD-10-CM

## 2020-08-14 DIAGNOSIS — I4821 Permanent atrial fibrillation: Secondary | ICD-10-CM

## 2020-08-14 LAB — POCT INR: INR: 3.9 — AB (ref 2.0–3.0)

## 2020-08-14 NOTE — Patient Instructions (Signed)
Description   Spoke with Lynn Ito, Hospice RN advised pt to hold tonight's dose then continue taking 1 tablet daily except 1/2 tablet on Tuesdays and Saturdays. TAKES WARFARIN AT NIGHT. Recheck INR in 2 weeks. Call coumadin clinic [phone] with any changes in medications or upcoming procedures

## 2020-08-21 ENCOUNTER — Ambulatory Visit (INDEPENDENT_AMBULATORY_CARE_PROVIDER_SITE_OTHER)

## 2020-08-21 DIAGNOSIS — I4821 Permanent atrial fibrillation: Secondary | ICD-10-CM

## 2020-08-21 LAB — POCT INR: INR: 1.9 — AB (ref 2.0–3.0)

## 2020-08-21 NOTE — Patient Instructions (Addendum)
Description   Spoke with Idalia Needle, Hospice RN advised pt to take 1.5 tablets tomorrow, then resume 1 tablet daily except 1/2 tablet on Tuesdays and Saturdays. TAKES WARFARIN AT NIGHT. Recheck INR in 1 week. Call coumadin clinic [phone] with any changes in medications or upcoming procedures

## 2020-08-22 ENCOUNTER — Ambulatory Visit: Payer: Medicare Other

## 2020-08-25 LAB — CUP PACEART REMOTE DEVICE CHECK
Battery Remaining Longevity: 3 mo
Battery Remaining Percentage: 4 %
Battery Voltage: 2.75 V
Brady Statistic AP VP Percent: 3.2 %
Brady Statistic AP VS Percent: 1 %
Brady Statistic AS VP Percent: 97 %
Brady Statistic AS VS Percent: 1 %
Brady Statistic RA Percent Paced: 2.9 %
Date Time Interrogation Session: 20220722020013
Implantable Lead Implant Date: 20081223
Implantable Lead Implant Date: 20081223
Implantable Lead Implant Date: 20081223
Implantable Lead Location: 753858
Implantable Lead Location: 753859
Implantable Lead Location: 753860
Implantable Lead Model: 5024
Implantable Lead Model: 5524
Implantable Pulse Generator Implant Date: 20160526
Lead Channel Impedance Value: 1100 Ohm
Lead Channel Impedance Value: 550 Ohm
Lead Channel Impedance Value: 980 Ohm
Lead Channel Pacing Threshold Amplitude: 1.75 V
Lead Channel Pacing Threshold Amplitude: 1.75 V
Lead Channel Pacing Threshold Amplitude: 2 V
Lead Channel Pacing Threshold Pulse Width: 0.5 ms
Lead Channel Pacing Threshold Pulse Width: 0.7 ms
Lead Channel Pacing Threshold Pulse Width: 0.8 ms
Lead Channel Sensing Intrinsic Amplitude: 3.6 mV
Lead Channel Sensing Intrinsic Amplitude: 7.3 mV
Lead Channel Setting Pacing Amplitude: 2.75 V
Lead Channel Setting Pacing Amplitude: 3.5 V
Lead Channel Setting Pacing Amplitude: 3.5 V
Lead Channel Setting Pacing Pulse Width: 0.7 ms
Lead Channel Setting Pacing Pulse Width: 0.8 ms
Lead Channel Setting Sensing Sensitivity: 4.5 mV
Pulse Gen Model: 3222
Pulse Gen Serial Number: 7738731

## 2020-08-28 ENCOUNTER — Ambulatory Visit (INDEPENDENT_AMBULATORY_CARE_PROVIDER_SITE_OTHER)

## 2020-08-28 DIAGNOSIS — I4821 Permanent atrial fibrillation: Secondary | ICD-10-CM | POA: Diagnosis not present

## 2020-08-28 DIAGNOSIS — Z5181 Encounter for therapeutic drug level monitoring: Secondary | ICD-10-CM | POA: Diagnosis not present

## 2020-08-28 LAB — POCT INR: INR: 3.8 — AB (ref 2.0–3.0)

## 2020-08-28 NOTE — Patient Instructions (Signed)
Description   Spoke with Idalia Needle, Hospice RN advised pt to hold today's dose then resume 1 tablet daily except 1/2 tablet on Tuesdays and Saturdays. TAKES WARFARIN AT NIGHT. Recheck INR in 1 week. Call coumadin clinic [phone] with any changes in medications or upcoming procedures

## 2020-09-04 ENCOUNTER — Ambulatory Visit (INDEPENDENT_AMBULATORY_CARE_PROVIDER_SITE_OTHER)

## 2020-09-04 DIAGNOSIS — I4891 Unspecified atrial fibrillation: Secondary | ICD-10-CM | POA: Diagnosis not present

## 2020-09-04 DIAGNOSIS — Z5181 Encounter for therapeutic drug level monitoring: Secondary | ICD-10-CM | POA: Diagnosis not present

## 2020-09-04 LAB — POCT INR: INR: 2.1 (ref 2.0–3.0)

## 2020-09-04 NOTE — Patient Instructions (Signed)
Description   Spoke with Idalia Needle, Hospice RN advised pt to continue 1 tablet daily except 1/2 tablet on Tuesdays and Saturdays. TAKES WARFARIN AT NIGHT. Recheck INR in 1 week. Call coumadin clinic [phone] with any changes in medications or upcoming procedures

## 2020-09-07 DIAGNOSIS — I6789 Other cerebrovascular disease: Secondary | ICD-10-CM | POA: Diagnosis not present

## 2020-09-07 DIAGNOSIS — R2681 Unsteadiness on feet: Secondary | ICD-10-CM | POA: Diagnosis not present

## 2020-09-07 DIAGNOSIS — R2689 Other abnormalities of gait and mobility: Secondary | ICD-10-CM | POA: Diagnosis not present

## 2020-09-08 ENCOUNTER — Ambulatory Visit (INDEPENDENT_AMBULATORY_CARE_PROVIDER_SITE_OTHER)

## 2020-09-08 DIAGNOSIS — R4701 Aphasia: Secondary | ICD-10-CM | POA: Diagnosis not present

## 2020-09-08 DIAGNOSIS — M6281 Muscle weakness (generalized): Secondary | ICD-10-CM | POA: Diagnosis not present

## 2020-09-08 DIAGNOSIS — R471 Dysarthria and anarthria: Secondary | ICD-10-CM | POA: Diagnosis not present

## 2020-09-08 DIAGNOSIS — R489 Unspecified symbolic dysfunctions: Secondary | ICD-10-CM | POA: Diagnosis not present

## 2020-09-08 DIAGNOSIS — I442 Atrioventricular block, complete: Secondary | ICD-10-CM

## 2020-09-08 DIAGNOSIS — I6789 Other cerebrovascular disease: Secondary | ICD-10-CM | POA: Diagnosis not present

## 2020-09-09 DIAGNOSIS — R2681 Unsteadiness on feet: Secondary | ICD-10-CM | POA: Diagnosis not present

## 2020-09-09 DIAGNOSIS — I6789 Other cerebrovascular disease: Secondary | ICD-10-CM | POA: Diagnosis not present

## 2020-09-09 DIAGNOSIS — R2689 Other abnormalities of gait and mobility: Secondary | ICD-10-CM | POA: Diagnosis not present

## 2020-09-09 LAB — CUP PACEART REMOTE DEVICE CHECK
Battery Remaining Longevity: 1 mo
Battery Remaining Percentage: 4 %
Battery Voltage: 2.74 V
Brady Statistic AP VP Percent: 6.5 %
Brady Statistic AP VS Percent: 1 %
Brady Statistic AS VP Percent: 93 %
Brady Statistic AS VS Percent: 1 %
Brady Statistic RA Percent Paced: 6.1 %
Date Time Interrogation Session: 20220808020013
Implantable Lead Implant Date: 20081223
Implantable Lead Implant Date: 20081223
Implantable Lead Implant Date: 20081223
Implantable Lead Location: 753858
Implantable Lead Location: 753859
Implantable Lead Location: 753860
Implantable Lead Model: 5024
Implantable Lead Model: 5524
Implantable Pulse Generator Implant Date: 20160526
Lead Channel Impedance Value: 1050 Ohm
Lead Channel Impedance Value: 550 Ohm
Lead Channel Impedance Value: 980 Ohm
Lead Channel Pacing Threshold Amplitude: 1.5 V
Lead Channel Pacing Threshold Amplitude: 1.75 V
Lead Channel Pacing Threshold Amplitude: 2.125 V
Lead Channel Pacing Threshold Pulse Width: 0.5 ms
Lead Channel Pacing Threshold Pulse Width: 0.7 ms
Lead Channel Pacing Threshold Pulse Width: 0.8 ms
Lead Channel Sensing Intrinsic Amplitude: 3.7 mV
Lead Channel Sensing Intrinsic Amplitude: 7.3 mV
Lead Channel Setting Pacing Amplitude: 2.5 V
Lead Channel Setting Pacing Amplitude: 3.125
Lead Channel Setting Pacing Amplitude: 3.5 V
Lead Channel Setting Pacing Pulse Width: 0.7 ms
Lead Channel Setting Pacing Pulse Width: 0.8 ms
Lead Channel Setting Sensing Sensitivity: 4.5 mV
Pulse Gen Model: 3222
Pulse Gen Serial Number: 7738731

## 2020-09-10 DIAGNOSIS — R489 Unspecified symbolic dysfunctions: Secondary | ICD-10-CM | POA: Diagnosis not present

## 2020-09-10 DIAGNOSIS — I6789 Other cerebrovascular disease: Secondary | ICD-10-CM | POA: Diagnosis not present

## 2020-09-10 DIAGNOSIS — R4701 Aphasia: Secondary | ICD-10-CM | POA: Diagnosis not present

## 2020-09-10 DIAGNOSIS — M6281 Muscle weakness (generalized): Secondary | ICD-10-CM | POA: Diagnosis not present

## 2020-09-10 DIAGNOSIS — R471 Dysarthria and anarthria: Secondary | ICD-10-CM | POA: Diagnosis not present

## 2020-09-11 ENCOUNTER — Ambulatory Visit (INDEPENDENT_AMBULATORY_CARE_PROVIDER_SITE_OTHER)

## 2020-09-11 DIAGNOSIS — R489 Unspecified symbolic dysfunctions: Secondary | ICD-10-CM | POA: Diagnosis not present

## 2020-09-11 DIAGNOSIS — I6789 Other cerebrovascular disease: Secondary | ICD-10-CM | POA: Diagnosis not present

## 2020-09-11 DIAGNOSIS — R4701 Aphasia: Secondary | ICD-10-CM | POA: Diagnosis not present

## 2020-09-11 DIAGNOSIS — Z5181 Encounter for therapeutic drug level monitoring: Secondary | ICD-10-CM | POA: Diagnosis not present

## 2020-09-11 DIAGNOSIS — I4891 Unspecified atrial fibrillation: Secondary | ICD-10-CM | POA: Diagnosis not present

## 2020-09-11 DIAGNOSIS — R2681 Unsteadiness on feet: Secondary | ICD-10-CM | POA: Diagnosis not present

## 2020-09-11 DIAGNOSIS — R471 Dysarthria and anarthria: Secondary | ICD-10-CM | POA: Diagnosis not present

## 2020-09-11 DIAGNOSIS — R2689 Other abnormalities of gait and mobility: Secondary | ICD-10-CM | POA: Diagnosis not present

## 2020-09-11 LAB — POCT INR: INR: 2.7 (ref 2.0–3.0)

## 2020-09-11 NOTE — Patient Instructions (Signed)
Description   Spoke with Pete Glatter, Hospice RN advised pt to continue 1 tablet daily except 1/2 tablet on Tuesdays and Saturdays. TAKES WARFARIN AT NIGHT. Recheck INR in 2 weeks. Call coumadin clinic [phone] with any changes in medications or upcoming procedures

## 2020-09-12 DIAGNOSIS — R4701 Aphasia: Secondary | ICD-10-CM | POA: Diagnosis not present

## 2020-09-12 DIAGNOSIS — R471 Dysarthria and anarthria: Secondary | ICD-10-CM | POA: Diagnosis not present

## 2020-09-12 DIAGNOSIS — M6281 Muscle weakness (generalized): Secondary | ICD-10-CM | POA: Diagnosis not present

## 2020-09-12 DIAGNOSIS — R489 Unspecified symbolic dysfunctions: Secondary | ICD-10-CM | POA: Diagnosis not present

## 2020-09-12 DIAGNOSIS — I6789 Other cerebrovascular disease: Secondary | ICD-10-CM | POA: Diagnosis not present

## 2020-09-13 DIAGNOSIS — R2689 Other abnormalities of gait and mobility: Secondary | ICD-10-CM | POA: Diagnosis not present

## 2020-09-13 DIAGNOSIS — I6789 Other cerebrovascular disease: Secondary | ICD-10-CM | POA: Diagnosis not present

## 2020-09-13 DIAGNOSIS — R2681 Unsteadiness on feet: Secondary | ICD-10-CM | POA: Diagnosis not present

## 2020-09-15 DIAGNOSIS — I6789 Other cerebrovascular disease: Secondary | ICD-10-CM | POA: Diagnosis not present

## 2020-09-15 DIAGNOSIS — R2681 Unsteadiness on feet: Secondary | ICD-10-CM | POA: Diagnosis not present

## 2020-09-15 DIAGNOSIS — R489 Unspecified symbolic dysfunctions: Secondary | ICD-10-CM | POA: Diagnosis not present

## 2020-09-15 DIAGNOSIS — R471 Dysarthria and anarthria: Secondary | ICD-10-CM | POA: Diagnosis not present

## 2020-09-15 DIAGNOSIS — M6281 Muscle weakness (generalized): Secondary | ICD-10-CM | POA: Diagnosis not present

## 2020-09-15 DIAGNOSIS — R2689 Other abnormalities of gait and mobility: Secondary | ICD-10-CM | POA: Diagnosis not present

## 2020-09-15 DIAGNOSIS — R4701 Aphasia: Secondary | ICD-10-CM | POA: Diagnosis not present

## 2020-09-16 DIAGNOSIS — R489 Unspecified symbolic dysfunctions: Secondary | ICD-10-CM | POA: Diagnosis not present

## 2020-09-16 DIAGNOSIS — R2681 Unsteadiness on feet: Secondary | ICD-10-CM | POA: Diagnosis not present

## 2020-09-16 DIAGNOSIS — I6789 Other cerebrovascular disease: Secondary | ICD-10-CM | POA: Diagnosis not present

## 2020-09-16 DIAGNOSIS — R2689 Other abnormalities of gait and mobility: Secondary | ICD-10-CM | POA: Diagnosis not present

## 2020-09-16 DIAGNOSIS — R471 Dysarthria and anarthria: Secondary | ICD-10-CM | POA: Diagnosis not present

## 2020-09-16 DIAGNOSIS — R4701 Aphasia: Secondary | ICD-10-CM | POA: Diagnosis not present

## 2020-09-17 DIAGNOSIS — R4701 Aphasia: Secondary | ICD-10-CM | POA: Diagnosis not present

## 2020-09-17 DIAGNOSIS — R489 Unspecified symbolic dysfunctions: Secondary | ICD-10-CM | POA: Diagnosis not present

## 2020-09-17 DIAGNOSIS — R471 Dysarthria and anarthria: Secondary | ICD-10-CM | POA: Diagnosis not present

## 2020-09-17 DIAGNOSIS — I6789 Other cerebrovascular disease: Secondary | ICD-10-CM | POA: Diagnosis not present

## 2020-09-17 DIAGNOSIS — M6281 Muscle weakness (generalized): Secondary | ICD-10-CM | POA: Diagnosis not present

## 2020-09-19 DIAGNOSIS — I6789 Other cerebrovascular disease: Secondary | ICD-10-CM | POA: Diagnosis not present

## 2020-09-19 DIAGNOSIS — M6281 Muscle weakness (generalized): Secondary | ICD-10-CM | POA: Diagnosis not present

## 2020-09-22 DIAGNOSIS — M6281 Muscle weakness (generalized): Secondary | ICD-10-CM | POA: Diagnosis not present

## 2020-09-22 DIAGNOSIS — I6789 Other cerebrovascular disease: Secondary | ICD-10-CM | POA: Diagnosis not present

## 2020-09-25 ENCOUNTER — Ambulatory Visit (INDEPENDENT_AMBULATORY_CARE_PROVIDER_SITE_OTHER)

## 2020-09-25 DIAGNOSIS — Z5181 Encounter for therapeutic drug level monitoring: Secondary | ICD-10-CM | POA: Diagnosis not present

## 2020-09-25 DIAGNOSIS — I4891 Unspecified atrial fibrillation: Secondary | ICD-10-CM

## 2020-09-25 LAB — POCT INR: INR: 2 (ref 2.0–3.0)

## 2020-09-25 NOTE — Patient Instructions (Signed)
Description   Spoke with Pete Glatter, Hospice RN advised pt to continue 1 tablet daily except 1/2 tablet on Tuesdays and Saturdays. TAKES WARFARIN AT NIGHT. Recheck INR in 2 weeks. Call coumadin clinic [phone] with any changes in medications or upcoming procedures

## 2020-10-02 DIAGNOSIS — Z20822 Contact with and (suspected) exposure to covid-19: Secondary | ICD-10-CM | POA: Diagnosis not present

## 2020-10-02 NOTE — Progress Notes (Signed)
Remote pacemaker transmission.   

## 2020-10-09 ENCOUNTER — Ambulatory Visit (INDEPENDENT_AMBULATORY_CARE_PROVIDER_SITE_OTHER)

## 2020-10-09 DIAGNOSIS — I4891 Unspecified atrial fibrillation: Secondary | ICD-10-CM

## 2020-10-09 LAB — POCT INR: INR: 2.5 (ref 2.0–3.0)

## 2020-10-09 NOTE — Patient Instructions (Addendum)
    Description   Spoke with Hospice RN advised pt to continue 1 tablet daily except 1/2 tablet on Tuesdays and Saturdays. TAKES WARFARIN AT NIGHT. Recheck INR in 3 weeks. Call coumadin clinic [phone] with any changes in medications or upcoming procedures

## 2020-10-16 DIAGNOSIS — Z8616 Personal history of COVID-19: Secondary | ICD-10-CM | POA: Diagnosis not present

## 2020-10-23 ENCOUNTER — Ambulatory Visit (INDEPENDENT_AMBULATORY_CARE_PROVIDER_SITE_OTHER): Payer: Medicare Other

## 2020-10-23 DIAGNOSIS — Z8616 Personal history of COVID-19: Secondary | ICD-10-CM | POA: Diagnosis not present

## 2020-10-23 DIAGNOSIS — I442 Atrioventricular block, complete: Secondary | ICD-10-CM

## 2020-10-25 LAB — CUP PACEART REMOTE DEVICE CHECK
Battery Remaining Longevity: 1 mo
Battery Remaining Percentage: 1 %
Battery Voltage: 2.66 V
Brady Statistic AP VP Percent: 8.7 %
Brady Statistic AP VS Percent: 1 %
Brady Statistic AS VP Percent: 91 %
Brady Statistic AS VS Percent: 1 %
Brady Statistic RA Percent Paced: 8.3 %
Date Time Interrogation Session: 20220922020022
Implantable Lead Implant Date: 20081223
Implantable Lead Implant Date: 20081223
Implantable Lead Implant Date: 20081223
Implantable Lead Location: 753858
Implantable Lead Location: 753859
Implantable Lead Location: 753860
Implantable Lead Model: 5024
Implantable Lead Model: 5524
Implantable Pulse Generator Implant Date: 20160526
Lead Channel Impedance Value: 1025 Ohm
Lead Channel Impedance Value: 510 Ohm
Lead Channel Impedance Value: 930 Ohm
Lead Channel Pacing Threshold Amplitude: 1.75 V
Lead Channel Pacing Threshold Amplitude: 2 V
Lead Channel Pacing Threshold Amplitude: 2.75 V
Lead Channel Pacing Threshold Pulse Width: 0.5 ms
Lead Channel Pacing Threshold Pulse Width: 0.7 ms
Lead Channel Pacing Threshold Pulse Width: 0.8 ms
Lead Channel Sensing Intrinsic Amplitude: 4 mV
Lead Channel Sensing Intrinsic Amplitude: 7.3 mV
Lead Channel Setting Pacing Amplitude: 3 V
Lead Channel Setting Pacing Amplitude: 3.5 V
Lead Channel Setting Pacing Amplitude: 4.75 V
Lead Channel Setting Pacing Pulse Width: 0.7 ms
Lead Channel Setting Pacing Pulse Width: 0.8 ms
Lead Channel Setting Sensing Sensitivity: 4.5 mV
Pulse Gen Model: 3222
Pulse Gen Serial Number: 7738731

## 2020-10-30 ENCOUNTER — Ambulatory Visit (INDEPENDENT_AMBULATORY_CARE_PROVIDER_SITE_OTHER): Admitting: *Deleted

## 2020-10-30 DIAGNOSIS — I4891 Unspecified atrial fibrillation: Secondary | ICD-10-CM | POA: Diagnosis not present

## 2020-10-30 DIAGNOSIS — Z5181 Encounter for therapeutic drug level monitoring: Secondary | ICD-10-CM

## 2020-10-30 DIAGNOSIS — Z8616 Personal history of COVID-19: Secondary | ICD-10-CM | POA: Diagnosis not present

## 2020-10-30 LAB — POCT INR: INR: 2.6 (ref 2.0–3.0)

## 2020-10-30 NOTE — Addendum Note (Signed)
Addended by: Cheri Kearns A on: 10/30/2020 01:34 PM   Modules accepted: Level of Service

## 2020-10-30 NOTE — Progress Notes (Signed)
Remote pacemaker transmission.   

## 2020-11-04 DIAGNOSIS — Z8616 Personal history of COVID-19: Secondary | ICD-10-CM | POA: Diagnosis not present

## 2020-11-10 DIAGNOSIS — Z20822 Contact with and (suspected) exposure to covid-19: Secondary | ICD-10-CM | POA: Diagnosis not present

## 2020-11-13 DIAGNOSIS — Z8616 Personal history of COVID-19: Secondary | ICD-10-CM | POA: Diagnosis not present

## 2020-11-24 ENCOUNTER — Ambulatory Visit (INDEPENDENT_AMBULATORY_CARE_PROVIDER_SITE_OTHER): Payer: Medicare Other

## 2020-11-24 DIAGNOSIS — I442 Atrioventricular block, complete: Secondary | ICD-10-CM

## 2020-11-25 DIAGNOSIS — Z20822 Contact with and (suspected) exposure to covid-19: Secondary | ICD-10-CM | POA: Diagnosis not present

## 2020-11-25 LAB — CUP PACEART REMOTE DEVICE CHECK
Battery Remaining Longevity: 1 mo
Battery Remaining Percentage: 0.5 %
Battery Voltage: 2.63 V
Brady Statistic AP VP Percent: 12 %
Brady Statistic AP VS Percent: 1 %
Brady Statistic AS VP Percent: 88 %
Brady Statistic AS VS Percent: 1 %
Brady Statistic RA Percent Paced: 11 %
Date Time Interrogation Session: 20221023020014
Implantable Lead Implant Date: 20081223
Implantable Lead Implant Date: 20081223
Implantable Lead Implant Date: 20081223
Implantable Lead Location: 753858
Implantable Lead Location: 753859
Implantable Lead Location: 753860
Implantable Lead Model: 5024
Implantable Lead Model: 5524
Implantable Pulse Generator Implant Date: 20160526
Lead Channel Impedance Value: 1125 Ohm
Lead Channel Impedance Value: 510 Ohm
Lead Channel Impedance Value: 960 Ohm
Lead Channel Pacing Threshold Amplitude: 1.625 V
Lead Channel Pacing Threshold Amplitude: 1.75 V
Lead Channel Pacing Threshold Amplitude: 2.25 V
Lead Channel Pacing Threshold Pulse Width: 0.5 ms
Lead Channel Pacing Threshold Pulse Width: 0.7 ms
Lead Channel Pacing Threshold Pulse Width: 0.8 ms
Lead Channel Sensing Intrinsic Amplitude: 2.6 mV
Lead Channel Sensing Intrinsic Amplitude: 7.3 mV
Lead Channel Setting Pacing Amplitude: 3.125
Lead Channel Setting Pacing Amplitude: 3.25 V
Lead Channel Setting Pacing Amplitude: 3.5 V
Lead Channel Setting Pacing Pulse Width: 0.7 ms
Lead Channel Setting Pacing Pulse Width: 0.8 ms
Lead Channel Setting Sensing Sensitivity: 4.5 mV
Pulse Gen Model: 3222
Pulse Gen Serial Number: 7738731

## 2020-11-27 ENCOUNTER — Ambulatory Visit (INDEPENDENT_AMBULATORY_CARE_PROVIDER_SITE_OTHER): Admitting: *Deleted

## 2020-11-27 DIAGNOSIS — Z5181 Encounter for therapeutic drug level monitoring: Secondary | ICD-10-CM

## 2020-11-27 DIAGNOSIS — I4891 Unspecified atrial fibrillation: Secondary | ICD-10-CM

## 2020-11-27 DIAGNOSIS — Z20822 Contact with and (suspected) exposure to covid-19: Secondary | ICD-10-CM | POA: Diagnosis not present

## 2020-11-27 LAB — POCT INR: INR: 2.1 (ref 2.0–3.0)

## 2020-11-27 NOTE — Patient Instructions (Addendum)
Description   Spoke with [facility] RN advised pt to continue taking 1 tablet daily except 1/2 tablet on Tuesdays and Saturdays. TAKES WARFARIN AT NIGHT. Recheck INR in 5 weeks. Call coumadin clinic [phone] with any changes in medications or upcoming procedures

## 2020-12-01 ENCOUNTER — Telehealth: Payer: Self-pay

## 2020-12-01 NOTE — Telephone Encounter (Signed)
Spoke with pt daughter [name], she reports that pt is still in Hospice care, she is not actively dying.  She reports that pt sleeps a lot.  She doesn't seem to have any interest in doing anything.    She reports that she has an appt scheduled with Dr. Rayann Heman on 01/30/21.

## 2020-12-01 NOTE — Telephone Encounter (Signed)
[facility] alert, device has reached ERI 10/28 2.62V, ERI=2.62V  Attempted to reach pt to advise.  No answer.  LVM per DPR on file requesting callback to device clinic.     LOV 08/06/20 with Oda Kilts, PA.  Pt is not device dependant.  Notes indicate Hospice care at that time, will need re-evaluate wether device needs to be changed out when reaches ERI.

## 2020-12-02 DIAGNOSIS — Z20822 Contact with and (suspected) exposure to covid-19: Secondary | ICD-10-CM | POA: Diagnosis not present

## 2020-12-02 NOTE — Addendum Note (Signed)
Addended by: Cheri Kearns A on: 12/02/2020 02:33 PM   Modules accepted: Level of Service

## 2020-12-02 NOTE — Progress Notes (Signed)
Remote pacemaker transmission.   

## 2020-12-06 NOTE — Telephone Encounter (Signed)
Please follow-up with daughter to see if they are interested in scheduling generator change.

## 2020-12-08 ENCOUNTER — Ambulatory Visit (INDEPENDENT_AMBULATORY_CARE_PROVIDER_SITE_OTHER)

## 2020-12-08 DIAGNOSIS — I442 Atrioventricular block, complete: Secondary | ICD-10-CM

## 2020-12-08 LAB — CUP PACEART REMOTE DEVICE CHECK
Battery Remaining Longevity: 0 mo
Battery Voltage: 2.6 V
Brady Statistic AP VP Percent: 11 %
Brady Statistic AP VS Percent: 1 %
Brady Statistic AS VP Percent: 89 %
Brady Statistic AS VS Percent: 1 %
Brady Statistic RA Percent Paced: 11 %
Date Time Interrogation Session: 20221107010013
Implantable Lead Implant Date: 20081223
Implantable Lead Implant Date: 20081223
Implantable Lead Implant Date: 20081223
Implantable Lead Location: 753858
Implantable Lead Location: 753859
Implantable Lead Location: 753860
Implantable Lead Model: 5024
Implantable Lead Model: 5524
Implantable Pulse Generator Implant Date: 20160526
Lead Channel Impedance Value: 1000 Ohm
Lead Channel Impedance Value: 1125 Ohm
Lead Channel Impedance Value: 550 Ohm
Lead Channel Pacing Threshold Amplitude: 1.75 V
Lead Channel Pacing Threshold Amplitude: 2.25 V
Lead Channel Pacing Threshold Amplitude: 2.375 V
Lead Channel Pacing Threshold Pulse Width: 0.5 ms
Lead Channel Pacing Threshold Pulse Width: 0.7 ms
Lead Channel Pacing Threshold Pulse Width: 0.8 ms
Lead Channel Sensing Intrinsic Amplitude: 4.2 mV
Lead Channel Sensing Intrinsic Amplitude: 7.3 mV
Lead Channel Setting Pacing Amplitude: 3.25 V
Lead Channel Setting Pacing Amplitude: 3.5 V
Lead Channel Setting Pacing Amplitude: 4.375
Lead Channel Setting Pacing Pulse Width: 0.7 ms
Lead Channel Setting Pacing Pulse Width: 0.8 ms
Lead Channel Setting Sensing Sensitivity: 4.5 mV
Pulse Gen Model: 3222
Pulse Gen Serial Number: 7738731

## 2020-12-08 NOTE — Telephone Encounter (Signed)
They have decided to hold off with having a generator change at this time. They will contact our office if they need anything further. Follow up still in place.

## 2020-12-09 DIAGNOSIS — Z20822 Contact with and (suspected) exposure to covid-19: Secondary | ICD-10-CM | POA: Diagnosis not present

## 2020-12-16 NOTE — Progress Notes (Signed)
Remote pacemaker transmission.   

## 2020-12-23 DIAGNOSIS — Z20822 Contact with and (suspected) exposure to covid-19: Secondary | ICD-10-CM | POA: Diagnosis not present

## 2021-01-01 ENCOUNTER — Ambulatory Visit (INDEPENDENT_AMBULATORY_CARE_PROVIDER_SITE_OTHER)

## 2021-01-01 DIAGNOSIS — Z5181 Encounter for therapeutic drug level monitoring: Secondary | ICD-10-CM

## 2021-01-01 DIAGNOSIS — I4891 Unspecified atrial fibrillation: Secondary | ICD-10-CM | POA: Diagnosis not present

## 2021-01-01 LAB — POCT INR: INR: 2.7 (ref 2.0–3.0)

## 2021-01-01 NOTE — Patient Instructions (Signed)
Description   Spoke with [facility] RN advised pt to continue taking 1 tablet daily except 1/2 tablet on Tuesdays and Saturdays. TAKES WARFARIN AT NIGHT. Recheck INR in 5 weeks. Call coumadin clinic [phone] with any changes in medications or upcoming procedures

## 2021-01-29 DIAGNOSIS — Z20822 Contact with and (suspected) exposure to covid-19: Secondary | ICD-10-CM | POA: Diagnosis not present

## 2021-01-30 ENCOUNTER — Telehealth: Payer: Self-pay | Admitting: Internal Medicine

## 2021-01-30 ENCOUNTER — Encounter: Payer: Self-pay | Admitting: Internal Medicine

## 2021-01-30 ENCOUNTER — Ambulatory Visit (INDEPENDENT_AMBULATORY_CARE_PROVIDER_SITE_OTHER): Admitting: Internal Medicine

## 2021-01-30 ENCOUNTER — Other Ambulatory Visit: Payer: Self-pay

## 2021-01-30 VITALS — BP 100/60 | HR 104 | Ht 63.0 in | Wt 124.0 lb

## 2021-01-30 DIAGNOSIS — I10 Essential (primary) hypertension: Secondary | ICD-10-CM

## 2021-01-30 DIAGNOSIS — I480 Paroxysmal atrial fibrillation: Secondary | ICD-10-CM | POA: Diagnosis not present

## 2021-01-30 DIAGNOSIS — I442 Atrioventricular block, complete: Secondary | ICD-10-CM

## 2021-01-30 DIAGNOSIS — I4892 Unspecified atrial flutter: Secondary | ICD-10-CM | POA: Diagnosis not present

## 2021-01-30 DIAGNOSIS — I5022 Chronic systolic (congestive) heart failure: Secondary | ICD-10-CM

## 2021-01-30 NOTE — Progress Notes (Signed)
PCP: Kathyrn Lass, MD Primary Cardiologist: Dr Irish Lack Primary EP:  Dr Rayann Heman  Debra Lynn is a 87 y.o. female who presents today for routine electrophysiology followup.  She is accompanied by her daughter who is caregiver.  The patient has advanced dementia and is in hospice. Today, she denies symptoms of chest pain, shortness of breath,  lower extremity edema, or syncope.  The patient is otherwise without complaint today.   Past Medical History:  Diagnosis Date   Acute ischemic stroke ([place]) 08/23/2012   Acute, but ill-defined, cerebrovascular disease 11/08/2012   Arthritis     Atrial fibrillation ([place])    a. s/p AVN ablation   Cancer ([place]) 12/2010   breast with recurrence s/p recent XRT   Complete heart block ([place])    a. s/p STJ CRTP   Compression fracture         CVA 05/07/2008       Fibrocystic breast    GERD (gastroesophageal reflux disease)    Glaucoma    Hyperlipidemia    Hypertension    Orthostatic hypotension    Overactive bladder    Parkinsonism ([place])    Pericarditis        Renal neoplasm 10/03/10   TUMOR ON RIGHT KIDNEY-OBSERVING   Seasonal allergic rhinitis    Pos Skin Test 07-05-07   Seizures ([place]) 2013       Thoracic aneurysm without mention of rupture    Past Surgical History:  Procedure Laterality Date   ABDOMINAL HYSTERECTOMY  1975   pt. unsure if laparascopic/vaginal/abdominal   ABLATION  1980s   AVN ablation    APPENDECTOMY     BI-VENTRICULAR PACEMAKER UPGRADE  01/2007   3rd device, upgraded to SJM Frontier II BiV pacemaker by Dr Leonia Reeves   BREAST SURGERY Right 01/15/11   mastectomy   CATARACT EXTRACTION Bilateral    CHOLECYSTECTOMY     EP IMPLANTABLE DEVICE N/A 06/27/2014   CRT-P  (SJM) gen change by Dr Rayann Heman   EYE SURGERY     For glaucoma   FLEXIBLE BRONCHOSCOPY W/ UPPER ENDOSCOPY     and swallowing evaluation   KNEE ARTHROSCOPY Right    Arthroscopic for torn meniscus   MASS EXCISION Right 01/19/2013   Procedure: EXCISION CHEST WALL  MASS;  Surgeon: [name], MD;  Location: [facility];  Service: General;  Laterality: Right;   [place]; gen change 2004   VESICOVAGINAL FISTULA CLOSURE W/ TAH      ROS- all systems are reviewed and negative except as per HPI above  Current Outpatient Medications  Medication Sig Dispense Refill   acetaminophen (TYLENOL) 500 MG tablet Take 1,000 mg by mouth every 6 (six) hours as needed for moderate pain (pain).      alendronate (FOSAMAX) 70 MG tablet Take 70 mg by mouth once a week. Take with a full glass of water on an empty stomach.     benzonatate (TESSALON) 100 MG capsule TAKE 1 CAPSULE BY MOUTH 3 TIMES A DAY AS NEEDED FOR COUGH 90 capsule 3   benzonatate (TESSALON) 100 MG capsule Take 100 mg by mouth 3 (three) times daily as needed for cough.     Calcium Carbonate-Vitamin D (CALCIUM-VITAMIN D) 500-200 MG-UNIT per tablet Take 1 tablet by mouth daily.     Calcium Carbonate-Vitamin D (CALTRATE 600+D PO) Take 1 tablet by mouth daily.     cephALEXin (KEFLEX) 250 MG capsule Take 250 mg by mouth at bedtime.  colestipol (COLESTID) 1 G tablet Take 1 g by mouth at bedtime.     colestipol (COLESTID) 1 g tablet Take 1 g by mouth at bedtime.     furosemide (LASIX) 40 MG tablet TAKE 1 TABLET BY MOUTH IN  THE MORNING 90 tablet 3   furosemide (LASIX) 40 MG tablet Take 40 mg by mouth daily.     levETIRAcetam (KEPPRA) 250 MG tablet Take 250 mg by mouth 2 (two) times daily.     levETIRAcetam (KEPPRA) 500 MG tablet Take 250 mg by mouth 2 (two) times daily.     Multiple Vitamin (MULTIVITAMIN WITH MINERALS) TABS tablet Take 1 tablet by mouth daily.     pantoprazole (PROTONIX) 40 MG tablet Take 1 tablet by mouth daily.     pantoprazole (PROTONIX) 40 MG tablet Take 40 mg by mouth every other day.     potassium chloride SA (KLOR-CON) 20 MEQ tablet TAKE 1 TABLET BY MOUTH  DAILY 90 tablet 3   potassium chloride SA (KLOR-CON) 20 MEQ tablet Take 20 mEq by mouth at bedtime.      pravastatin (PRAVACHOL) 80 MG tablet TAKE 1 TABLET BY MOUTH  DAILY 90 tablet 3   Probiotic Product (ALIGN) 4 MG CAPS Take 4 mg by mouth daily. 30 capsule 0   Probiotic Product (PROBIOTIC DAILY PO) Take 1 capsule by mouth daily.     propranolol (INDERAL) 60 MG tablet TAKE ONE-HALF TABLET BY  MOUTH TWICE DAILY 90 tablet 3   propranolol (INDERAL) 60 MG tablet Take 30 mg by mouth 2 (two) times daily.     warfarin (COUMADIN) 2.5 MG tablet TAKE 1/2 TO 1 TABLET BY  MOUTH DAILY AS DIRECTED BY  THE COUMADIN CLINIC 90 tablet 3   warfarin (COUMADIN) 2.5 MG tablet Take 1.25-2.5 mg by mouth See admin instructions. 1.25 mg Tuesday and Saturday 2.5 mg Monday,Wednesday,Thursday,Friday and sunday     No current facility-administered medications for this visit.    Physical Exam: Vitals:   01/30/21 1046  BP: 100/60  Pulse: (!) 104  SpO2: 93%  Weight: 124 lb (56.2 kg)  Height: 5\' 3"  (1.6 m)    GEN- The patient is elderly and frail appearing, alert and conversant Head- normocephalic, atraumatic Eyes-  Sclera clear, conjunctiva pink Ears- hearing intact Oropharynx- clear Lungs- Clear to ausculation bilaterally, normal work of breathing Chest- pacemaker pocket is well healed Heart- Regular rate and rhythm  GI- soft  Extremities- no clubbing, cyanosis, or edema  Pacemaker not interrogated today.  Last remote reviewed  ekg tracing ordered today is personally reviewed and shows V paced at 104 bpm  Assessment and Plan:  1. Symptomatic complete heart block Last remote confirms that she reached ERI 11/2020.  she is not device dependant  I had a very long conversation with her daughter Debra Lynn) today who is very clear that the patient and her family's wishes are to not pursue pacemaker replacement.  The patient is in hospice for advanced dementia and is DNR/DNI. We will therefore not interrogate her device today and will discontinue remote follow-up care as per their wishes.  2. Chronic systolic  dysfunction Good response to CRT  3. HTN Stable No change required today  4. Afib/ atrial  flutter Burden is low asymptomatic She is on coumadin for chads2vasc score of 5  Return prn  Thompson Grayer MD, [facility] 01/30/2021 10:56 AM

## 2021-01-30 NOTE — Patient Instructions (Addendum)
Medication Instructions:  °Your physician recommends that you continue on your current medications as directed. Please refer to the Current Medication list given to you today. °*If you need a refill on your cardiac medications before your next appointment, please call your pharmacy* ° °Lab Work: °None. °If you have labs (blood work) drawn today and your tests are completely normal, you will receive your results only by: °MyChart Message (if you have MyChart) OR °A paper copy in the mail °If you have any lab test that is abnormal or we need to change your treatment, we will call you to review the results. ° °Testing/Procedures: °None. ° °Follow-Up: °At [facility], you and your health needs are our priority.  As part of our continuing mission to provide you with exceptional heart care, we have created designated Provider Care Teams.  These Care Teams include your primary Cardiologist (physician) and Advanced Practice Providers (APPs -  Physician Assistants and Nurse Practitioners) who all work together to provide you with the care you need, when you need it. ° °Your physician wants you to follow-up in: As needed with Dr. Allred.  ° °We recommend signing up for the patient portal called "MyChart".  Sign up information is provided on this After Visit Summary.  MyChart is used to connect with patients for Virtual Visits (Telemedicine).  Patients are able to view lab/test results, encounter notes, upcoming appointments, etc.  Non-urgent messages can be sent to your provider as well.   °To learn more about what you can do with MyChart, go to [email or web address].   ° °Any Other Special Instructions Will Be Listed Below (If Applicable). ° ° ° ° °  ° ° °

## 2021-02-03 DIAGNOSIS — Z20822 Contact with and (suspected) exposure to covid-19: Secondary | ICD-10-CM | POA: Diagnosis not present

## 2021-02-05 ENCOUNTER — Ambulatory Visit (INDEPENDENT_AMBULATORY_CARE_PROVIDER_SITE_OTHER): Payer: Medicare Other | Admitting: Interventional Cardiology

## 2021-02-05 DIAGNOSIS — Z5181 Encounter for therapeutic drug level monitoring: Secondary | ICD-10-CM

## 2021-02-05 DIAGNOSIS — Z20822 Contact with and (suspected) exposure to covid-19: Secondary | ICD-10-CM | POA: Diagnosis not present

## 2021-02-05 LAB — POCT INR: INR: 3.7 — AB (ref 2.0–3.0)

## 2021-02-05 NOTE — Patient Instructions (Signed)
Description   Spoke with [facility] RN advised pt to Hold warfarin today and then continue taking 1 tablet daily except 1/2 tablet on Tuesdays and Saturdays. TAKES WARFARIN AT NIGHT. Recheck INR in 1.5 weeks (usually 5 weeks.) Call coumadin clinic [phone] with any changes in medications or upcoming procedures

## 2021-02-10 DIAGNOSIS — Z20822 Contact with and (suspected) exposure to covid-19: Secondary | ICD-10-CM | POA: Diagnosis not present

## 2021-02-17 ENCOUNTER — Ambulatory Visit (INDEPENDENT_AMBULATORY_CARE_PROVIDER_SITE_OTHER): Payer: Medicare Other | Admitting: *Deleted

## 2021-02-17 DIAGNOSIS — Z20822 Contact with and (suspected) exposure to covid-19: Secondary | ICD-10-CM | POA: Diagnosis not present

## 2021-02-17 DIAGNOSIS — I480 Paroxysmal atrial fibrillation: Secondary | ICD-10-CM

## 2021-02-17 DIAGNOSIS — Z5181 Encounter for therapeutic drug level monitoring: Secondary | ICD-10-CM

## 2021-02-17 LAB — POCT INR: INR: 2.1 (ref 2.0–3.0)

## 2021-02-24 ENCOUNTER — Ambulatory Visit (INDEPENDENT_AMBULATORY_CARE_PROVIDER_SITE_OTHER)

## 2021-02-24 DIAGNOSIS — I480 Paroxysmal atrial fibrillation: Secondary | ICD-10-CM | POA: Diagnosis not present

## 2021-02-24 LAB — POCT INR: INR: 3 (ref 2.0–3.0)

## 2021-02-24 NOTE — Patient Instructions (Signed)
Description   Spoke with [facility] RN advised pt to continue taking 1 tablet daily except 1/2 tablet on Tuesdays and Saturdays. TAKES WARFARIN AT NIGHT. Recheck INR in 2 weeks-not eating well (usually 5 weeks.) Call coumadin clinic [phone] with any changes in medications or upcoming procedures

## 2021-03-10 ENCOUNTER — Ambulatory Visit (INDEPENDENT_AMBULATORY_CARE_PROVIDER_SITE_OTHER): Admitting: *Deleted

## 2021-03-10 DIAGNOSIS — Z5181 Encounter for therapeutic drug level monitoring: Secondary | ICD-10-CM | POA: Diagnosis not present

## 2021-03-10 DIAGNOSIS — I480 Paroxysmal atrial fibrillation: Secondary | ICD-10-CM | POA: Diagnosis not present

## 2021-03-10 LAB — POCT INR: INR: 3.3 — AB (ref 2.0–3.0)

## 2021-03-10 NOTE — Patient Instructions (Signed)
Description   Spoke with [facility] RN advised pt to hold today's dose then start taking 1 tablet daily except 1/2 tablet on Tuesdays, Thursdays, and Saturdays. TAKES WARFARIN AT NIGHT. Recheck INR in 2 weeks-not eating well. Call coumadin clinic [phone] with any changes in medications or upcoming procedures

## 2021-03-24 ENCOUNTER — Ambulatory Visit (INDEPENDENT_AMBULATORY_CARE_PROVIDER_SITE_OTHER): Admitting: *Deleted

## 2021-03-24 DIAGNOSIS — Z5181 Encounter for therapeutic drug level monitoring: Secondary | ICD-10-CM

## 2021-03-24 DIAGNOSIS — I480 Paroxysmal atrial fibrillation: Secondary | ICD-10-CM

## 2021-03-24 LAB — POCT INR: INR: 2.8 (ref 2.0–3.0)

## 2021-03-24 NOTE — Patient Instructions (Signed)
Description   Spoke with [facility] RN advised pt to continue taking 1 tablet daily except 1/2 tablet on Tuesdays, Thursdays, and Saturdays. TAKES WARFARIN AT NIGHT. Recheck INR in 2 weeks-not eating well. Call coumadin clinic [phone] with any changes in medications or upcoming procedures

## 2021-04-06 ENCOUNTER — Telehealth: Payer: Self-pay | Admitting: *Deleted

## 2021-04-06 NOTE — Telephone Encounter (Signed)
Received a call Shawna with [facility] and she stated the daughter is requesting the pt stop Warfarin and start ASA. The nurse reports the pt is still eating, drinking, talking and walking with her walker with minimal assistance. She states the daughter does not want her to have the finger sticks anymore. Advised her to speak with her Hospice Physician for their input and to update me. Will await a call back or reach out to her on tomorrow.  ?

## 2021-04-07 NOTE — Telephone Encounter (Addendum)
Called Debra Lynn with [facility] and she stated the Hospice physician has discontinued the pt's warfarin and will start the pt on aspirin. Will discontinue the Anticoagulation track since Warfarin has been discontinued at this time and will update pt's Cardiologist. ? ? ?

## 2021-04-17 ENCOUNTER — Ambulatory Visit: Payer: Medicare Other | Admitting: Interventional Cardiology

## 2021-05-05 DIAGNOSIS — R488 Other symbolic dysfunctions: Secondary | ICD-10-CM | POA: Diagnosis not present

## 2021-05-05 DIAGNOSIS — R471 Dysarthria and anarthria: Secondary | ICD-10-CM | POA: Diagnosis not present

## 2021-05-05 DIAGNOSIS — R1312 Dysphagia, oropharyngeal phase: Secondary | ICD-10-CM | POA: Diagnosis not present

## 2021-05-05 DIAGNOSIS — R4701 Aphasia: Secondary | ICD-10-CM | POA: Diagnosis not present

## 2021-05-06 DIAGNOSIS — R471 Dysarthria and anarthria: Secondary | ICD-10-CM | POA: Diagnosis not present

## 2021-05-06 DIAGNOSIS — R488 Other symbolic dysfunctions: Secondary | ICD-10-CM | POA: Diagnosis not present

## 2021-05-06 DIAGNOSIS — R1312 Dysphagia, oropharyngeal phase: Secondary | ICD-10-CM | POA: Diagnosis not present

## 2021-05-06 DIAGNOSIS — R4701 Aphasia: Secondary | ICD-10-CM | POA: Diagnosis not present

## 2021-05-07 DIAGNOSIS — R488 Other symbolic dysfunctions: Secondary | ICD-10-CM | POA: Diagnosis not present

## 2021-05-07 DIAGNOSIS — R1312 Dysphagia, oropharyngeal phase: Secondary | ICD-10-CM | POA: Diagnosis not present

## 2021-05-07 DIAGNOSIS — R471 Dysarthria and anarthria: Secondary | ICD-10-CM | POA: Diagnosis not present

## 2021-05-07 DIAGNOSIS — R4701 Aphasia: Secondary | ICD-10-CM | POA: Diagnosis not present

## 2021-05-08 DIAGNOSIS — R471 Dysarthria and anarthria: Secondary | ICD-10-CM | POA: Diagnosis not present

## 2021-05-08 DIAGNOSIS — R1312 Dysphagia, oropharyngeal phase: Secondary | ICD-10-CM | POA: Diagnosis not present

## 2021-05-08 DIAGNOSIS — R4701 Aphasia: Secondary | ICD-10-CM | POA: Diagnosis not present

## 2021-05-08 DIAGNOSIS — R488 Other symbolic dysfunctions: Secondary | ICD-10-CM | POA: Diagnosis not present

## 2021-05-11 DIAGNOSIS — R488 Other symbolic dysfunctions: Secondary | ICD-10-CM | POA: Diagnosis not present

## 2021-05-11 DIAGNOSIS — R471 Dysarthria and anarthria: Secondary | ICD-10-CM | POA: Diagnosis not present

## 2021-05-11 DIAGNOSIS — R4701 Aphasia: Secondary | ICD-10-CM | POA: Diagnosis not present

## 2021-05-11 DIAGNOSIS — R1312 Dysphagia, oropharyngeal phase: Secondary | ICD-10-CM | POA: Diagnosis not present

## 2021-05-14 DIAGNOSIS — R4701 Aphasia: Secondary | ICD-10-CM | POA: Diagnosis not present

## 2021-05-14 DIAGNOSIS — R1312 Dysphagia, oropharyngeal phase: Secondary | ICD-10-CM | POA: Diagnosis not present

## 2021-05-14 DIAGNOSIS — R488 Other symbolic dysfunctions: Secondary | ICD-10-CM | POA: Diagnosis not present

## 2021-05-14 DIAGNOSIS — R471 Dysarthria and anarthria: Secondary | ICD-10-CM | POA: Diagnosis not present

## 2021-05-15 DIAGNOSIS — R4701 Aphasia: Secondary | ICD-10-CM | POA: Diagnosis not present

## 2021-05-15 DIAGNOSIS — R471 Dysarthria and anarthria: Secondary | ICD-10-CM | POA: Diagnosis not present

## 2021-05-15 DIAGNOSIS — R488 Other symbolic dysfunctions: Secondary | ICD-10-CM | POA: Diagnosis not present

## 2021-05-15 DIAGNOSIS — R1312 Dysphagia, oropharyngeal phase: Secondary | ICD-10-CM | POA: Diagnosis not present

## 2021-05-18 DIAGNOSIS — R1312 Dysphagia, oropharyngeal phase: Secondary | ICD-10-CM | POA: Diagnosis not present

## 2021-05-18 DIAGNOSIS — R471 Dysarthria and anarthria: Secondary | ICD-10-CM | POA: Diagnosis not present

## 2021-05-18 DIAGNOSIS — R488 Other symbolic dysfunctions: Secondary | ICD-10-CM | POA: Diagnosis not present

## 2021-05-18 DIAGNOSIS — R4701 Aphasia: Secondary | ICD-10-CM | POA: Diagnosis not present

## 2021-05-22 DIAGNOSIS — R471 Dysarthria and anarthria: Secondary | ICD-10-CM | POA: Diagnosis not present

## 2021-05-22 DIAGNOSIS — R4701 Aphasia: Secondary | ICD-10-CM | POA: Diagnosis not present

## 2021-05-22 DIAGNOSIS — R488 Other symbolic dysfunctions: Secondary | ICD-10-CM | POA: Diagnosis not present

## 2021-05-22 DIAGNOSIS — R1312 Dysphagia, oropharyngeal phase: Secondary | ICD-10-CM | POA: Diagnosis not present

## 2021-05-26 DIAGNOSIS — R488 Other symbolic dysfunctions: Secondary | ICD-10-CM | POA: Diagnosis not present

## 2021-05-26 DIAGNOSIS — R1312 Dysphagia, oropharyngeal phase: Secondary | ICD-10-CM | POA: Diagnosis not present

## 2021-05-26 DIAGNOSIS — R471 Dysarthria and anarthria: Secondary | ICD-10-CM | POA: Diagnosis not present

## 2021-05-26 DIAGNOSIS — R4701 Aphasia: Secondary | ICD-10-CM | POA: Diagnosis not present

## 2021-05-27 DIAGNOSIS — R4701 Aphasia: Secondary | ICD-10-CM | POA: Diagnosis not present

## 2021-05-27 DIAGNOSIS — R471 Dysarthria and anarthria: Secondary | ICD-10-CM | POA: Diagnosis not present

## 2021-05-27 DIAGNOSIS — R488 Other symbolic dysfunctions: Secondary | ICD-10-CM | POA: Diagnosis not present

## 2021-05-27 DIAGNOSIS — R1312 Dysphagia, oropharyngeal phase: Secondary | ICD-10-CM | POA: Diagnosis not present

## 2021-05-29 DIAGNOSIS — R471 Dysarthria and anarthria: Secondary | ICD-10-CM | POA: Diagnosis not present

## 2021-05-29 DIAGNOSIS — R1312 Dysphagia, oropharyngeal phase: Secondary | ICD-10-CM | POA: Diagnosis not present

## 2021-05-29 DIAGNOSIS — R4701 Aphasia: Secondary | ICD-10-CM | POA: Diagnosis not present

## 2021-05-29 DIAGNOSIS — R488 Other symbolic dysfunctions: Secondary | ICD-10-CM | POA: Diagnosis not present

## 2021-06-02 DIAGNOSIS — R1312 Dysphagia, oropharyngeal phase: Secondary | ICD-10-CM | POA: Diagnosis not present

## 2021-06-02 DIAGNOSIS — R471 Dysarthria and anarthria: Secondary | ICD-10-CM | POA: Diagnosis not present

## 2021-06-02 DIAGNOSIS — R488 Other symbolic dysfunctions: Secondary | ICD-10-CM | POA: Diagnosis not present

## 2021-06-02 DIAGNOSIS — R4701 Aphasia: Secondary | ICD-10-CM | POA: Diagnosis not present

## 2021-06-16 DIAGNOSIS — R488 Other symbolic dysfunctions: Secondary | ICD-10-CM | POA: Diagnosis not present

## 2021-06-16 DIAGNOSIS — R4701 Aphasia: Secondary | ICD-10-CM | POA: Diagnosis not present

## 2021-06-16 DIAGNOSIS — R1312 Dysphagia, oropharyngeal phase: Secondary | ICD-10-CM | POA: Diagnosis not present

## 2021-06-16 DIAGNOSIS — R471 Dysarthria and anarthria: Secondary | ICD-10-CM | POA: Diagnosis not present

## 2021-06-17 DIAGNOSIS — R1312 Dysphagia, oropharyngeal phase: Secondary | ICD-10-CM | POA: Diagnosis not present

## 2021-06-17 DIAGNOSIS — R488 Other symbolic dysfunctions: Secondary | ICD-10-CM | POA: Diagnosis not present

## 2021-06-17 DIAGNOSIS — R471 Dysarthria and anarthria: Secondary | ICD-10-CM | POA: Diagnosis not present

## 2021-06-17 DIAGNOSIS — R4701 Aphasia: Secondary | ICD-10-CM | POA: Diagnosis not present

## 2021-06-18 DIAGNOSIS — R488 Other symbolic dysfunctions: Secondary | ICD-10-CM | POA: Diagnosis not present

## 2021-06-18 DIAGNOSIS — R1312 Dysphagia, oropharyngeal phase: Secondary | ICD-10-CM | POA: Diagnosis not present

## 2021-06-18 DIAGNOSIS — R4701 Aphasia: Secondary | ICD-10-CM | POA: Diagnosis not present

## 2021-06-18 DIAGNOSIS — R471 Dysarthria and anarthria: Secondary | ICD-10-CM | POA: Diagnosis not present

## 2021-06-19 DIAGNOSIS — R471 Dysarthria and anarthria: Secondary | ICD-10-CM | POA: Diagnosis not present

## 2021-06-19 DIAGNOSIS — R488 Other symbolic dysfunctions: Secondary | ICD-10-CM | POA: Diagnosis not present

## 2021-06-19 DIAGNOSIS — R4701 Aphasia: Secondary | ICD-10-CM | POA: Diagnosis not present

## 2021-06-19 DIAGNOSIS — R1312 Dysphagia, oropharyngeal phase: Secondary | ICD-10-CM | POA: Diagnosis not present

## 2021-06-22 DIAGNOSIS — R4701 Aphasia: Secondary | ICD-10-CM | POA: Diagnosis not present

## 2021-06-22 DIAGNOSIS — R1312 Dysphagia, oropharyngeal phase: Secondary | ICD-10-CM | POA: Diagnosis not present

## 2021-06-22 DIAGNOSIS — R471 Dysarthria and anarthria: Secondary | ICD-10-CM | POA: Diagnosis not present

## 2021-06-22 DIAGNOSIS — R488 Other symbolic dysfunctions: Secondary | ICD-10-CM | POA: Diagnosis not present

## 2021-06-23 DIAGNOSIS — R1312 Dysphagia, oropharyngeal phase: Secondary | ICD-10-CM | POA: Diagnosis not present

## 2021-06-23 DIAGNOSIS — R4701 Aphasia: Secondary | ICD-10-CM | POA: Diagnosis not present

## 2021-06-23 DIAGNOSIS — R471 Dysarthria and anarthria: Secondary | ICD-10-CM | POA: Diagnosis not present

## 2021-06-23 DIAGNOSIS — R488 Other symbolic dysfunctions: Secondary | ICD-10-CM | POA: Diagnosis not present

## 2021-06-24 DIAGNOSIS — R1312 Dysphagia, oropharyngeal phase: Secondary | ICD-10-CM | POA: Diagnosis not present

## 2021-06-24 DIAGNOSIS — R471 Dysarthria and anarthria: Secondary | ICD-10-CM | POA: Diagnosis not present

## 2021-06-24 DIAGNOSIS — R488 Other symbolic dysfunctions: Secondary | ICD-10-CM | POA: Diagnosis not present

## 2021-06-24 DIAGNOSIS — R4701 Aphasia: Secondary | ICD-10-CM | POA: Diagnosis not present

## 2021-06-25 DIAGNOSIS — R471 Dysarthria and anarthria: Secondary | ICD-10-CM | POA: Diagnosis not present

## 2021-06-25 DIAGNOSIS — R1312 Dysphagia, oropharyngeal phase: Secondary | ICD-10-CM | POA: Diagnosis not present

## 2021-06-25 DIAGNOSIS — R488 Other symbolic dysfunctions: Secondary | ICD-10-CM | POA: Diagnosis not present

## 2021-06-25 DIAGNOSIS — R4701 Aphasia: Secondary | ICD-10-CM | POA: Diagnosis not present

## 2021-06-26 DIAGNOSIS — R488 Other symbolic dysfunctions: Secondary | ICD-10-CM | POA: Diagnosis not present

## 2021-06-26 DIAGNOSIS — R4701 Aphasia: Secondary | ICD-10-CM | POA: Diagnosis not present

## 2021-06-26 DIAGNOSIS — R471 Dysarthria and anarthria: Secondary | ICD-10-CM | POA: Diagnosis not present

## 2021-06-26 DIAGNOSIS — R1312 Dysphagia, oropharyngeal phase: Secondary | ICD-10-CM | POA: Diagnosis not present

## 2021-06-29 DIAGNOSIS — R4701 Aphasia: Secondary | ICD-10-CM | POA: Diagnosis not present

## 2021-06-29 DIAGNOSIS — R488 Other symbolic dysfunctions: Secondary | ICD-10-CM | POA: Diagnosis not present

## 2021-06-29 DIAGNOSIS — R1312 Dysphagia, oropharyngeal phase: Secondary | ICD-10-CM | POA: Diagnosis not present

## 2021-06-29 DIAGNOSIS — R471 Dysarthria and anarthria: Secondary | ICD-10-CM | POA: Diagnosis not present

## 2021-06-30 DIAGNOSIS — R488 Other symbolic dysfunctions: Secondary | ICD-10-CM | POA: Diagnosis not present

## 2021-06-30 DIAGNOSIS — R1312 Dysphagia, oropharyngeal phase: Secondary | ICD-10-CM | POA: Diagnosis not present

## 2021-06-30 DIAGNOSIS — R4701 Aphasia: Secondary | ICD-10-CM | POA: Diagnosis not present

## 2021-06-30 DIAGNOSIS — R471 Dysarthria and anarthria: Secondary | ICD-10-CM | POA: Diagnosis not present

## 2021-07-01 DIAGNOSIS — R488 Other symbolic dysfunctions: Secondary | ICD-10-CM | POA: Diagnosis not present

## 2021-07-01 DIAGNOSIS — R1312 Dysphagia, oropharyngeal phase: Secondary | ICD-10-CM | POA: Diagnosis not present

## 2021-07-01 DIAGNOSIS — R471 Dysarthria and anarthria: Secondary | ICD-10-CM | POA: Diagnosis not present

## 2021-07-01 DIAGNOSIS — R4701 Aphasia: Secondary | ICD-10-CM | POA: Diagnosis not present

## 2021-07-07 DIAGNOSIS — R4701 Aphasia: Secondary | ICD-10-CM | POA: Diagnosis not present

## 2021-07-07 DIAGNOSIS — R488 Other symbolic dysfunctions: Secondary | ICD-10-CM | POA: Diagnosis not present

## 2021-07-07 DIAGNOSIS — R471 Dysarthria and anarthria: Secondary | ICD-10-CM | POA: Diagnosis not present

## 2021-07-07 DIAGNOSIS — R1312 Dysphagia, oropharyngeal phase: Secondary | ICD-10-CM | POA: Diagnosis not present

## 2021-07-08 DIAGNOSIS — R1312 Dysphagia, oropharyngeal phase: Secondary | ICD-10-CM | POA: Diagnosis not present

## 2021-07-08 DIAGNOSIS — R471 Dysarthria and anarthria: Secondary | ICD-10-CM | POA: Diagnosis not present

## 2021-07-08 DIAGNOSIS — R488 Other symbolic dysfunctions: Secondary | ICD-10-CM | POA: Diagnosis not present

## 2021-07-08 DIAGNOSIS — R4701 Aphasia: Secondary | ICD-10-CM | POA: Diagnosis not present

## 2021-07-09 ENCOUNTER — Emergency Department (HOSPITAL_COMMUNITY)

## 2021-07-09 ENCOUNTER — Inpatient Hospital Stay (HOSPITAL_COMMUNITY)
Admission: EM | Admit: 2021-07-09 | Discharge: 2021-07-11 | DRG: 064 | Disposition: A | Discharge status: Hospice | Source: Skilled Nursing Facility | Attending: Internal Medicine | Admitting: Internal Medicine

## 2021-07-09 ENCOUNTER — Encounter (HOSPITAL_COMMUNITY): Payer: Self-pay

## 2021-07-09 ENCOUNTER — Other Ambulatory Visit: Payer: Self-pay

## 2021-07-09 DIAGNOSIS — Z923 Personal history of irradiation: Secondary | ICD-10-CM | POA: Diagnosis not present

## 2021-07-09 DIAGNOSIS — Z9181 History of falling: Secondary | ICD-10-CM | POA: Diagnosis not present

## 2021-07-09 DIAGNOSIS — Z950 Presence of cardiac pacemaker: Secondary | ICD-10-CM | POA: Diagnosis not present

## 2021-07-09 DIAGNOSIS — Z7982 Long term (current) use of aspirin: Secondary | ICD-10-CM

## 2021-07-09 DIAGNOSIS — S60221A Contusion of right hand, initial encounter: Secondary | ICD-10-CM | POA: Diagnosis not present

## 2021-07-09 DIAGNOSIS — R9431 Abnormal electrocardiogram [ECG] [EKG]: Secondary | ICD-10-CM | POA: Diagnosis not present

## 2021-07-09 DIAGNOSIS — R4182 Altered mental status, unspecified: Secondary | ICD-10-CM

## 2021-07-09 DIAGNOSIS — Z87898 Personal history of other specified conditions: Secondary | ICD-10-CM

## 2021-07-09 DIAGNOSIS — S065X0A Traumatic subdural hemorrhage without loss of consciousness, initial encounter: Secondary | ICD-10-CM | POA: Diagnosis not present

## 2021-07-09 DIAGNOSIS — Z833 Family history of diabetes mellitus: Secondary | ICD-10-CM

## 2021-07-09 DIAGNOSIS — Z515 Encounter for palliative care: Secondary | ICD-10-CM

## 2021-07-09 DIAGNOSIS — F0280 Dementia in other diseases classified elsewhere without behavioral disturbance: Secondary | ICD-10-CM | POA: Diagnosis present

## 2021-07-09 DIAGNOSIS — E785 Hyperlipidemia, unspecified: Secondary | ICD-10-CM | POA: Diagnosis not present

## 2021-07-09 DIAGNOSIS — I10 Essential (primary) hypertension: Secondary | ICD-10-CM | POA: Diagnosis present

## 2021-07-09 DIAGNOSIS — Z87440 Personal history of urinary (tract) infections: Secondary | ICD-10-CM | POA: Diagnosis not present

## 2021-07-09 DIAGNOSIS — R296 Repeated falls: Secondary | ICD-10-CM | POA: Diagnosis present

## 2021-07-09 DIAGNOSIS — Z825 Family history of asthma and other chronic lower respiratory diseases: Secondary | ICD-10-CM

## 2021-07-09 DIAGNOSIS — Z66 Do not resuscitate: Secondary | ICD-10-CM | POA: Diagnosis present

## 2021-07-09 DIAGNOSIS — I6201 Nontraumatic acute subdural hemorrhage: Principal | ICD-10-CM | POA: Diagnosis present

## 2021-07-09 DIAGNOSIS — Z8673 Personal history of transient ischemic attack (TIA), and cerebral infarction without residual deficits: Secondary | ICD-10-CM

## 2021-07-09 DIAGNOSIS — R569 Unspecified convulsions: Secondary | ICD-10-CM | POA: Diagnosis not present

## 2021-07-09 DIAGNOSIS — Z853 Personal history of malignant neoplasm of breast: Secondary | ICD-10-CM | POA: Diagnosis not present

## 2021-07-09 DIAGNOSIS — G20 Parkinson's disease: Secondary | ICD-10-CM | POA: Diagnosis present

## 2021-07-09 DIAGNOSIS — Z807 Family history of other malignant neoplasms of lymphoid, hematopoietic and related tissues: Secondary | ICD-10-CM

## 2021-07-09 DIAGNOSIS — I482 Chronic atrial fibrillation, unspecified: Secondary | ICD-10-CM | POA: Diagnosis not present

## 2021-07-09 DIAGNOSIS — I442 Atrioventricular block, complete: Secondary | ICD-10-CM | POA: Diagnosis present

## 2021-07-09 DIAGNOSIS — Z791 Long term (current) use of non-steroidal anti-inflammatories (NSAID): Secondary | ICD-10-CM | POA: Diagnosis not present

## 2021-07-09 DIAGNOSIS — R410 Disorientation, unspecified: Secondary | ICD-10-CM | POA: Diagnosis not present

## 2021-07-09 DIAGNOSIS — S0990XA Unspecified injury of head, initial encounter: Secondary | ICD-10-CM | POA: Diagnosis not present

## 2021-07-09 DIAGNOSIS — H409 Unspecified glaucoma: Secondary | ICD-10-CM | POA: Diagnosis not present

## 2021-07-09 DIAGNOSIS — Z8249 Family history of ischemic heart disease and other diseases of the circulatory system: Secondary | ICD-10-CM

## 2021-07-09 DIAGNOSIS — R52 Pain, unspecified: Secondary | ICD-10-CM | POA: Diagnosis not present

## 2021-07-09 DIAGNOSIS — M19041 Primary osteoarthritis, right hand: Secondary | ICD-10-CM | POA: Diagnosis not present

## 2021-07-09 DIAGNOSIS — M1990 Unspecified osteoarthritis, unspecified site: Secondary | ICD-10-CM | POA: Diagnosis present

## 2021-07-09 DIAGNOSIS — K219 Gastro-esophageal reflux disease without esophagitis: Secondary | ICD-10-CM | POA: Diagnosis present

## 2021-07-09 DIAGNOSIS — S065XAA Traumatic subdural hemorrhage with loss of consciousness status unknown, initial encounter: Secondary | ICD-10-CM | POA: Diagnosis not present

## 2021-07-09 DIAGNOSIS — R404 Transient alteration of awareness: Secondary | ICD-10-CM | POA: Diagnosis not present

## 2021-07-09 DIAGNOSIS — R5381 Other malaise: Secondary | ICD-10-CM | POA: Diagnosis not present

## 2021-07-09 DIAGNOSIS — Z79899 Other long term (current) drug therapy: Secondary | ICD-10-CM

## 2021-07-09 DIAGNOSIS — W19XXXA Unspecified fall, initial encounter: Secondary | ICD-10-CM

## 2021-07-09 DIAGNOSIS — Z974 Presence of external hearing-aid: Secondary | ICD-10-CM

## 2021-07-09 DIAGNOSIS — R1310 Dysphagia, unspecified: Secondary | ICD-10-CM | POA: Diagnosis not present

## 2021-07-09 DIAGNOSIS — Z9010 Acquired absence of unspecified breast and nipple: Secondary | ICD-10-CM | POA: Diagnosis not present

## 2021-07-09 DIAGNOSIS — S60011A Contusion of right thumb without damage to nail, initial encounter: Secondary | ICD-10-CM | POA: Diagnosis not present

## 2021-07-09 DIAGNOSIS — G9341 Metabolic encephalopathy: Secondary | ICD-10-CM | POA: Diagnosis present

## 2021-07-09 DIAGNOSIS — Z743 Need for continuous supervision: Secondary | ICD-10-CM | POA: Diagnosis not present

## 2021-07-09 DIAGNOSIS — D72829 Elevated white blood cell count, unspecified: Secondary | ICD-10-CM | POA: Diagnosis not present

## 2021-07-09 DIAGNOSIS — I4891 Unspecified atrial fibrillation: Secondary | ICD-10-CM | POA: Diagnosis present

## 2021-07-09 DIAGNOSIS — H9193 Unspecified hearing loss, bilateral: Secondary | ICD-10-CM | POA: Diagnosis present

## 2021-07-09 LAB — CBC
HCT: 41 % (ref 36.0–46.0)
Hemoglobin: 13.4 g/dL (ref 12.0–15.0)
MCH: 29.5 pg (ref 26.0–34.0)
MCHC: 32.7 g/dL (ref 30.0–36.0)
MCV: 90.3 fL (ref 80.0–100.0)
Platelets: 150 10*3/uL (ref 150–400)
RBC: 4.54 MIL/uL (ref 3.87–5.11)
RDW: 13.2 % (ref 11.5–15.5)
WBC: 13.3 10*3/uL — ABNORMAL HIGH (ref 4.0–10.5)
nRBC: 0 % (ref 0.0–0.2)

## 2021-07-09 LAB — URINALYSIS, ROUTINE W REFLEX MICROSCOPIC
Bacteria, UA: NONE SEEN
Bilirubin Urine: NEGATIVE
Glucose, UA: NEGATIVE mg/dL
Hgb urine dipstick: NEGATIVE
Ketones, ur: NEGATIVE mg/dL
Leukocytes,Ua: NEGATIVE
Nitrite: NEGATIVE
Protein, ur: 30 mg/dL — AB
Specific Gravity, Urine: 1.019 (ref 1.005–1.030)
pH: 5 (ref 5.0–8.0)

## 2021-07-09 LAB — CK: Total CK: 326 U/L — ABNORMAL HIGH (ref 38–234)

## 2021-07-09 LAB — COMPREHENSIVE METABOLIC PANEL
ALT: 12 U/L (ref 0–44)
AST: 29 U/L (ref 15–41)
Albumin: 3.8 g/dL (ref 3.5–5.0)
Alkaline Phosphatase: 30 U/L — ABNORMAL LOW (ref 38–126)
Anion gap: 10 (ref 5–15)
BUN: 19 mg/dL (ref 8–23)
CO2: 22 mmol/L (ref 22–32)
Calcium: 8.9 mg/dL (ref 8.9–10.3)
Chloride: 106 mmol/L (ref 98–111)
Creatinine, Ser: 0.99 mg/dL (ref 0.44–1.00)
GFR, Estimated: 54 mL/min — ABNORMAL LOW (ref >60)
Glucose, Bld: 109 mg/dL — ABNORMAL HIGH (ref 70–99)
Potassium: 4.4 mmol/L (ref 3.5–5.1)
Sodium: 138 mmol/L (ref 135–145)
Total Bilirubin: 0.9 mg/dL (ref 0.3–1.2)
Total Protein: 6.4 g/dL — ABNORMAL LOW (ref 6.5–8.1)

## 2021-07-09 LAB — AMMONIA: Ammonia: 22 umol/L (ref 9–35)

## 2021-07-09 LAB — MAGNESIUM: Magnesium: 2 mg/dL (ref 1.7–2.4)

## 2021-07-09 LAB — PROTIME-INR
INR: 1.1 (ref 0.8–1.2)
Prothrombin Time: 13.6 seconds (ref 11.4–15.2)

## 2021-07-09 MED ORDER — HYDRALAZINE HCL 20 MG/ML IJ SOLN: 5.0000 mg | INTRAMUSCULAR | Status: DC | PRN

## 2021-07-09 MED ORDER — SODIUM CHLORIDE 0.9 % IV BOLUS
500.0000 mL | Freq: Once | INTRAVENOUS | Status: AC
  Administered 2021-07-09: 500 mL via INTRAVENOUS

## 2021-07-09 MED ORDER — ACETAMINOPHEN 325 MG PO TABS: 650.0000 mg | ORAL_TABLET | Freq: Four times a day (QID) | ORAL | Status: DC | PRN

## 2021-07-09 MED ORDER — ALBUTEROL SULFATE (2.5 MG/3ML) 0.083% IN NEBU: 2.5000 mg | INHALATION_SOLUTION | Freq: Four times a day (QID) | RESPIRATORY_TRACT | Status: DC | PRN

## 2021-07-09 MED ORDER — PROBIOTIC BIOGAIA/SOOTHE NICU ORAL SYRINGE
5.0000 [drp] | Freq: Every day | ORAL | Status: DC
Start: 2021-07-09 — End: 2021-07-09

## 2021-07-09 MED ORDER — LEVETIRACETAM 250 MG PO TABS
250.0000 mg | ORAL_TABLET | Freq: Two times a day (BID) | ORAL | Status: DC
  Administered 2021-07-09 – 2021-07-11 (×5): 250 mg via ORAL
  Filled 2021-07-09 (×6): qty 1

## 2021-07-09 MED ORDER — CEPHALEXIN 250 MG PO CAPS
250.0000 mg | ORAL_CAPSULE | Freq: Every day | ORAL | Status: DC
  Administered 2021-07-10: 250 mg via ORAL
  Filled 2021-07-09 (×2): qty 1

## 2021-07-09 MED ORDER — BENZONATATE 100 MG PO CAPS
100.0000 mg | ORAL_CAPSULE | Freq: Three times a day (TID) | ORAL | Status: DC | PRN
Start: 2021-07-09 — End: 2021-07-11

## 2021-07-09 MED ORDER — ACETAMINOPHEN 650 MG RE SUPP: 650.0000 mg | Freq: Four times a day (QID) | RECTAL | Status: DC | PRN

## 2021-07-09 MED ORDER — COLESTIPOL HCL 1 G PO TABS
1.0000 g | ORAL_TABLET | Freq: Every day | ORAL | Status: DC
  Administered 2021-07-09 – 2021-07-10 (×2): 1 g via ORAL
  Filled 2021-07-09 (×3): qty 1

## 2021-07-09 MED ORDER — PANTOPRAZOLE SODIUM 40 MG PO TBEC
40.0000 mg | DELAYED_RELEASE_TABLET | Freq: Every day | ORAL | Status: DC
  Administered 2021-07-09 – 2021-07-11 (×3): 40 mg via ORAL
  Filled 2021-07-09 (×3): qty 1

## 2021-07-09 MED ORDER — SODIUM CHLORIDE 0.9% FLUSH
3.0000 mL | Freq: Two times a day (BID) | INTRAVENOUS | Status: DC
Start: 2021-07-09 — End: 2021-07-11
  Administered 2021-07-09 – 2021-07-11 (×5): 3 mL via INTRAVENOUS

## 2021-07-09 MED ORDER — PROPRANOLOL HCL 20 MG PO TABS
30.0000 mg | ORAL_TABLET | Freq: Two times a day (BID) | ORAL | Status: DC
  Administered 2021-07-09 – 2021-07-10 (×4): 30 mg via ORAL
  Filled 2021-07-09 (×8): qty 1

## 2021-07-09 NOTE — Progress Notes (Signed)
[facility]) Hospitalized Hospice Patient  [name] is a current hospice patient with a terminal diagnosis of cerebrovascular disease. She was noted to be increasingly confused at her facility and continually complaining of a new onset of HA. Hospice RN made visit last night and the plan was to re-evaluate in the AM, however pt then sustained a fall and was sent to the ED for evaluation. She is being admitted with altered mental status and acute SDH. This is a related hospital admission per Dr. Gildardo Cranker, [facility] MD.   Met with patient and her daughter Debra Lynn in the hallway. Debra Lynn is well versed in hospice philosophy and has been a Psychologist, occupational for hospice for many years. Discussed plan to admit for observation of SDH but Debra Lynn is clear that no surgical interventions are desired. Further discussed that she wants to ensure her mother stays a DNR, as well as desire for IV abx, should they be needed. Debra Lynn would want her treated with abx if it would bring her relief (r/t UTI), but she would not want abx for any other reason. If her mother stays in her current state, she will likely need to d/c to [facility] for EOL care.   V/S: 98.7 oral, 129/96, HR 78, RR 15, SPO2 98% RA I&O: none recorded Labs: CK 326, WBC 13.3 Diagnostics: CT head - Acute subdural hematoma along the left tentorium measuring up to 6 mm in thickness IV/PRN: NS bolus 500 ml IV x 1  Problem List per attending note: Subdural hematoma Acute.  Patient had noted to have complained of headache yesterday afternoon and was not her normal self prior to being found on the floor this morning.  CT imaging gave concern for a acute subdural hematoma the left and Torie measuring up to 6 mm in thickness without mass effect.  Daughter would not want any surgical intervention.  Neurosurgery have been consulted and did not feel patient needed any surgical intervention or repeat imaging. -Admit to a medical telemetry  bed -Neurochecks -Goal systolic blood pressures less than 160.  Hydralazine IV as needed -Hold aspirin and DVT prophylaxis   Leukocytosis Acute.  WBC elevated 13.3.  Thought secondary to above, but also question possibility of UTI. Attempt was made to in and out cath patient, but without success.  She is currently in a hallway bed and unable to have PureWick looked up. Daughter reports that if she does have a urinary tract infection she would want treated, but is on chronic suppressive therapy.   -Recheck CBC tomorrow morning -Check urinalysis and urine culture once a   Acute metabolic encephalopathy Speech is more impaired and she is more slow to respond than usual.  Possibly related to subdural hematoma. -Neurochecks -Check TSH   Dysphagia Acute. Daughter reports patient has a issues with intermittently getting choked up while eating. -Aspiration precautions with elevation of head of bed -Speech therapy swallow evaluation    Chronic atrial fibrillation CHB s/p PM Patient appears to be in atrial fibrillation, but no longer on anticoagulation due to frequent falls.   She has a pacemaker in place that was last interrogated 12/08/2020 and the battery was noted to be at the low threshold/ ERI mode.  Family decided not to replace the battery. -Goal potassium at least 4 and magnesium at least 2 -Continue propranolol as tolerated   Essential hypertension Blood pressures currently maintained. -Continue propranolol -Initially held furosemide due to patient appearing somewhat dry with elevated BUN to creatinine ratio.  Resume when medically appropriate.   History of recurrent UTI Patient has had recurrent UTI and is currently on cephalexin for prophylaxis. -Follow-up urinalysis and urine culture -Continue cephalexin  She is inpatient appropriate for evaluation of acute SDH with altered mental status.  GOC: Clear. DNR. No surgical intervention. Family is open to exploring cause of  leukocytosis but wants to be specific what they choose to treat. They want her to be comfortable and honor her wishes. They are open to abx therapy for a short duration if r/t urinary causes. D/C planning: Ongoing. Possibly d/c to [facility] if she continues to be like she currently is. Family: Discussed with Cindy at the bedside. IDT: Hospice team updated.  Transfer summary and med list to shadow chart.  Venia Carbon DNP, RN [facility] Liaison

## 2021-07-09 NOTE — ED Notes (Signed)
Pt continues to sleep soundly.  Pt's daughter left to get Pt's glass and other important belongings.

## 2021-07-09 NOTE — TOC Initial Note (Signed)
Transition of Care [facility]) - Initial/Assessment Note    Patient Details  Name: Debra Lynn MRN: [id number] Date of Birth: [birth year]  Transition of Care [facility]) CM/SW Contact:    Verdell Carmine, RN Phone Number: 07/09/2021, 4:57 PM  Clinical Narrative:                 Hospice has seen patient and discussed with Daughter [name]. Agreeable to IV antibiotics to treat discomfort with infection. Patient is DNR, has subdural bleed, not looking for surgical intervention.      IF continues with same mentation ( obtunded) will likley move to [facility].      Patient Goals and CMS Choice        Expected Discharge Plan and [place]. ( Previously home hospice)                                          Prior Living Arrangements/Services                       Activities of Daily Living      Permission Sought/Granted                  Emotional Assessment              Admission diagnosis:  SDH (subdural hematoma) ([place]) [S06.5XAA] Patient Active Problem List   Diagnosis Date Noted   SDH (subdural hematoma) ([place]) 71/16/5790   Acute metabolic encephalopathy 38/33/3832   Leukocytosis 07/09/2021   Dysphagia 07/09/2021   History of recurrent UTIs 07/09/2021   History of seizures 07/09/2021   Prolonged QT interval 07/09/2021   Acute bronchospasm due to viral infection 03/15/2016   Flu 03/13/2016   Influenza A 03/12/2016   UTI (urinary tract infection) 03/12/2016   Acute encephalopathy 03/12/2016   History of stroke 03/12/2016   Hypokalemia 03/12/2016   Muscle weakness (generalized)    Weakness 03/11/2016   Lung nodules 12/01/2014   Resting head tremor 01/20/2013   Parkinsonism ([place])    Orthostatic hypotension    Chest wall recurrence of breast cancer ([place]) 01/19/2013   Atrial fibrillation ([place]) 11/08/2012   Complete heart block ([place]) 08/23/2012   Pacemaker 08/23/2012   HTN (hypertension) 08/23/2012   Hyperlipidemia 08/23/2012    Primary cancer of upper inner quadrant of right female breast ([place]) 12/30/2010   Seasonal and perennial allergic rhinitis 04/02/2010   G E R D 07/02/2007   RHINOSINUSITIS, CHRONIC 05/18/2007   PCP:  Kathyrn Lass, MD Pharmacy:   [facility] ([place]) - [place], [facility] [address] [place] [address] [place] [address] Phone: [phone] Fax: [phone]  [id number] - [place], [address] [place] [address] Phone: [phone] Fax: [phone]     Social Determinants of Health (SDOH) Interventions    Readmission Risk Interventions     No data to display

## 2021-07-09 NOTE — ED Notes (Signed)
Pt unable to give urine sample. Brief saturated

## 2021-07-09 NOTE — ED Triage Notes (Signed)
Arrives EMS from [facility]). After being found on the floor. Pt not answering questions and per family not at baseline.   Daughter at bedside reports pt was slightly agitated yesterday. Notified Hospice and was told someone may being medication by in the morning.   Pt has hx of afib. Not currently taking any anticoagulants.

## 2021-07-09 NOTE — Progress Notes (Signed)
   07/09/21 1125  Clinical Encounter Type  Visited With Family  Visit Type Initial;Spiritual support  Consult/Referral To Chaplain   Chaplain visited with daughter of the patient, Tramaine. The daughter, [name], I noticed might need to talk.  We conversed about her mother and her life.  She expressed sadness as this would be the third close family member she would lose in a short timeframe. It is hard but she knows it will happen at one point.   Danice Goltz [facility]  [phone]

## 2021-07-09 NOTE — ED Notes (Signed)
Pt's daughter reports Pt takes medications whole w/ water.  Sts staff at the facility have to dump them in her mouth.  Pt is currently sleeping soundly.  Will attempt to give medications when Pt wakes up.

## 2021-07-09 NOTE — ED Provider Notes (Signed)
[facility] Emergency Department Provider Note MRN:  [id number]  Arrival date & time: 07/09/21     Chief Complaint   Fall   History of Present Illness   Debra Lynn is a 88 y.o. year-old female with a history of stroke, A-fib presenting to the ED with chief complaint of fall.  History obtained from patient's daughter at bedside.  Was acting a bit abnormally last night, not as conversant, seemed confused.  Found on the ground this morning at care facility, unwitnessed fall suspected.  Patient is not communicating, seems globally confused at this time.  Review of Systems  A thorough review of systems was obtained and all systems are negative except as noted in the HPI and PMH.   Patient's Health History    Past Medical History:  Diagnosis Date   Acute ischemic stroke ([place]) 08/23/2012   Acute, but ill-defined, cerebrovascular disease 11/08/2012   Arthritis     Atrial fibrillation ([place])    a. s/p AVN ablation   Cancer ([place]) 12/2010   breast with recurrence s/p recent XRT   Complete heart block ([place])    a. s/p STJ CRTP   Compression fracture         CVA 05/07/2008       Fibrocystic breast    GERD (gastroesophageal reflux disease)    Glaucoma    Hyperlipidemia    Hypertension    Orthostatic hypotension    Overactive bladder    Parkinsonism ([place])    Pericarditis        Renal neoplasm 10/03/10   TUMOR ON RIGHT KIDNEY-OBSERVING   Seasonal allergic rhinitis    Pos Skin Test 07-05-07   Seizures ([place]) 2013       Thoracic aneurysm without mention of rupture     Past Surgical History:  Procedure Laterality Date   ABDOMINAL HYSTERECTOMY  1975   pt. unsure if laparascopic/vaginal/abdominal   ABLATION  1980s   AVN ablation    APPENDECTOMY     BI-VENTRICULAR PACEMAKER UPGRADE  01/2007   3rd device, upgraded to SJM Frontier II BiV pacemaker by Dr Leonia Reeves   BREAST SURGERY Right 01/15/11   mastectomy   CATARACT EXTRACTION Bilateral    CHOLECYSTECTOMY      EP IMPLANTABLE DEVICE N/A 06/27/2014   CRT-P  (SJM) gen change by Dr Rayann Heman   EYE SURGERY     For glaucoma   FLEXIBLE BRONCHOSCOPY W/ UPPER ENDOSCOPY     and swallowing evaluation   KNEE ARTHROSCOPY Right    Arthroscopic for torn meniscus   MASS EXCISION Right 01/19/2013   Procedure: EXCISION CHEST WALL MASS;  Surgeon: [name], MD;  Location: [facility] ORS;  Service: General;  Laterality: Right;   PACEMAKER INSERTION     1992; gen change 2004   VESICOVAGINAL FISTULA CLOSURE W/ TAH      Family History  Problem Relation Age of Onset   Heart failure Mother    Cancer Father        brain   Asthma Brother    Asthma Daughter    Heart disease Unknown    Cancer Unknown    Lymphoma Brother    Diabetes Brother    Hypertension Brother    Heart attack Brother     Social History   Socioeconomic History   Marital status: Married    Spouse name: Not on file   Number of children: 2   Years of education: Not on file   Highest education level: Not  on file  Occupational History   Occupation: retired Secretary/administrator  Tobacco Use   Smoking status: Never   Smokeless tobacco: Never  Vaping Use   Vaping Use: Never used  Substance and Sexual Activity   Alcohol use: No   Drug use: No   Sexual activity: Not on file  Other Topics Concern   Not on file  Social History Narrative   Not on file   Social Determinants of Health   Financial Resource Strain: Not on file  Food Insecurity: Not on file  Transportation Needs: Not on file  Physical Activity: Not on file  Stress: Not on file  Social Connections: Not on file  Intimate Partner Violence: Not on file     Physical Exam   Vitals:   07/09/21 0602  BP: 134/78  Pulse: 78  Resp: 15  Temp: 98.7 F (37.1 C)  SpO2: 98%    CONSTITUTIONAL: Chronically ill-appearing, NAD NEURO/PSYCH: Somnolent, wakes to voice, does not follow commands EYES:  eyes equal and reactive ENT/NECK:  no LAD, no JVD CARDIO: Well rate, well-perfused,  normal S1 and S2 PULM:  CTAB no wheezing or rhonchi GI/GU:  non-distended, non-tender MSK/SPINE:  No gross deformities, no edema SKIN: Bruising to right hand   *Additional and/or pertinent findings included in MDM below  Diagnostic and Interventional Summary    EKG Interpretation  Date/Time:    Ventricular Rate:    PR Interval:    QRS Duration:   QT Interval:    QTC Calculation:   R Axis:     Text Interpretation:         Labs Reviewed  CBC  COMPREHENSIVE METABOLIC PANEL  CK    CT HEAD WO CONTRAST (5MM)    (Results Pending)  CT CERVICAL SPINE WO CONTRAST    (Results Pending)  DG Hand Complete Right    (Results Pending)  DG Chest 1 View    (Results Pending)    Medications  sodium chloride 0.9 % bolus 500 mL (has no administration in time range)     Procedures  /  Critical Care Procedures  ED Course and Medical Decision Making  Initial Impression and Ddx Differential diagnosis includes UTI, electrolyte disturbance, occult pneumonia, CNS lesion, subdural hematoma from the fall, worsening of underlying dementia.  Work-up is pending.  Patient is DNR, DNI but daughter would be interested in hospitalization.  Past medical/surgical history that increases complexity of ED encounter: Stroke  Interpretation of Diagnostics Labs, imaging pending  Patient Reassessment and Ultimate Disposition/Management     Signed out to oncoming provider, anticipating admission.  Patient management required discussion with the following services or consulting groups:  None  Complexity of Problems Addressed Acute illness or injury that poses threat of life of bodily function  Additional Data Reviewed and Analyzed Further history obtained from: Further history from spouse/family member  Additional Factors Impacting ED Encounter Risk Consideration of hospitalization  Barth Kirks. Sedonia Small, [place] [email or web address]  Final  Clinical Impressions(s) / ED Diagnoses     ICD-10-CM   1. Fall, initial encounter  W19.XXXA     2. Altered mental status, unspecified altered mental status type  R41.82       ED Discharge Orders     None        Discharge Instructions Discussed with and Provided to Patient:   Discharge Instructions   None      Maudie Flakes, MD 07/09/21 [phone]

## 2021-07-09 NOTE — ED Provider Notes (Signed)
7:09 AM Care assumed from Dr. Sedonia Small.  At time of transfer of care patient is starting her work-up to look for etiologies of altered mental status and fall overnight.  During signout, I personally viewed and interpreted CT head imaging and it is concerning for acute subdural hematoma.  I quickly called radiology and spoke to them on the phone and they confirm that there is indeed a 7 mm subdural on the left side with no evidence of midline shift.  I went and spoke to the family to let them know this and that we will call neurosurgery quickly.  Daughter says that patient was acting about her normal self yesterday during lunch but by the time in the evening she was complaining of some headache and acting confused and disoriented.  I suspect she had onset of this bleeding during that time.  Daughter says that when she has fallen in the past she has more guided herself to the ground rather than acute head trauma.  According to previous team, patient did not have any focal neurologic deficits.   We will continue her work-up however anticipate admission for altered mental status and decreased ambulation in the setting of new subdural hematoma.  7:46 AM Spoke to Dr. Glenford Peers with neurosurgery who agrees with admission of the patient.  He will review the images and make further recommendations however given the age and DNR status it did not sound like there are conversation that intervention will be needed at this time.  We will call for admission after labs have been performed.  Spoke again to Dr. Glenford Peers who reviewed the images and feel she does not need repeat CT and this will likely not need a procedure.  He felt that they blood pressure goal of less than 160 is reasonable and her blood pressure now is around 144 systolic which is in that range.  He agrees with admission for PT/OT, likely palliative involvement, and further management of her altered mental status from her baseline.  9:32 AM Labs began to  return and she does have slight elevation in her CK but otherwise metabolic panel overall reassuring.  Mild leukocytosis but no anemia.  Ammonia normal.  INR normal.  Urinalysis will be done with in and out catheter.  Imaging of the chest and hand did not show evidence of acute traumatic injuries.  Spoke to medicine who will admit for further management.  Clinical Impression: 1. Subdural hematoma ([place])   2. Fall, initial encounter   3. Altered mental status, unspecified altered mental status type     Disposition: Admit  This note was prepared with assistance of Dragon voice recognition software. Occasional wrong-word or sound-a-like substitutions may have occurred due to the inherent limitations of voice recognition software.        Debra Lynn, Gwenyth Allegra, MD 07/09/21 [phone]

## 2021-07-09 NOTE — H&P (Signed)
History and Physical    Patient: Debra Lynn ZRA:[id number] DOB: 28-Oct-1928 DOA: 07/09/2021 DOS: the patient was seen and examined on 07/09/2021 PCP: Kathyrn Lass, MD  Patient coming from:   Chief Complaint:  Chief Complaint  Patient presents with   Fall   HPI: Debra Lynn is a 88 y.o. female with medical history significant of hypertension, hyperlipidemia, CVA, atrial fibrillation no longer on anticoagulation due to frequent falls, CHB s/p pacemaker, Parkinson's disease presents after being found on floor this morning.  History is obtained from the patient's daughter over the phone.  The patient is hard of hearing and does not currently have her hearing aids in place.  She is currently on hospice and CODE STATUS is confirmed as DO NOT RESUSCITATE.  The patient's daughter had visited her during lunch and seem to be in her normal state of health at that time complaining of some knee pain which is not unusual for her.  Only patient is able to get around with use of a walker for short distances and usually uses a wheelchair to go down to the dining hall.  Around 4:30 PM that afternoon she did not seem to be her normal self and was complaining of headache.  She had been given Tylenol without improvement in symptoms.  Daughter thought the right side of her face was drooping, but she was able to smile at that time.  This morning the patient had been found on the floor.  Daughter does not think that she necessarily fell as there was no signs of trauma, and possibly just laid down in the floor.  The patient denies any complaints of pain at this time.  Daughter also confirms that they would not want to go any surgical intervention, if she were to have a urinary tract infection that would want that treated, and ultimately they would like her to go to [facility] at discharge.  Daughter also made note that the patient had intermittently seem to get choked up with eating.  Upon admission to the emergency  department patient was noted to have stable vital signs.  Labs significant for WBC 13.3, CK 326, ammonia normal, and INR normal.  CT scan of the head and neck noted acute subdural hematoma along the left tentorium measuring up to 6 mm in thickness without significant mass effect and no signs of a cervical fracture.  Dr. Glenford Peers of neurosurgery consulted, but did not feel that the patient need repeat CT likely need a procedure.  Recommended blood pressure goal of less than 160.  Patient has been given normal saline 500 mL bolus of IV fluids.  Review of Systems: unable to review all systems due to the inability of the patient to answer questions. Past Medical History:  Diagnosis Date   Acute ischemic stroke ([place]) 08/23/2012   Acute, but ill-defined, cerebrovascular disease 11/08/2012   Arthritis     Atrial fibrillation ([place])    a. s/p AVN ablation   Cancer ([place]) 12/2010   breast with recurrence s/p recent XRT   Complete heart block ([place])    a. s/p STJ CRTP   Compression fracture         CVA 05/07/2008       Fibrocystic breast    GERD (gastroesophageal reflux disease)    Glaucoma    Hyperlipidemia    Hypertension    Orthostatic hypotension    Overactive bladder    Parkinsonism (H[facility])    Pericarditis  Renal neoplasm 10/03/10   TUMOR ON RIGHT KIDNEY-OBSERVING   Seasonal allergic rhinitis    Pos Skin Test 07-05-07   Seizures [facility]) 2013       Thoracic aneurysm without mention of rupture    Past Surgical History:  Procedure Laterality Date   ABDOMINAL HYSTERECTOMY  1975   pt. unsure if laparascopic/vaginal/abdominal   ABLATION  1980s   AVN ablation    APPENDECTOMY     BI-VENTRICULAR PACEMAKER UPGRADE  01/2007   3rd device, upgraded to [facility] Frontier II BiV pacemaker by Dr Leonia Reeves   BREAST SURGERY Right 01/15/11   mastectomy   CATARACT EXTRACTION Bilateral    CHOLECYSTECTOMY     EP IMPLANTABLE DEVICE N/A 06/27/2014   CRT-P  ([facility]) gen change by Dr Rayann Heman   EYE SURGERY     For  glaucoma   FLEXIBLE BRONCHOSCOPY W/ UPPER ENDOSCOPY     and swallowing evaluation   KNEE ARTHROSCOPY Right    Arthroscopic for torn meniscus   MASS EXCISION Right 01/19/2013   Procedure: EXCISION CHEST WALL MASS;  Surgeon: [name], MD;  Location: [facility] ORS;  Service: General;  Laterality: Right;   [place]; gen change 2004   VESICOVAGINAL FISTULA CLOSURE W/ TAH     Social History:  reports that she has never smoked. She has never used smokeless tobacco. She reports that she does not drink alcohol and does not use drugs.  No Known Allergies  Family History  Problem Relation Age of Onset   Heart failure Mother    Cancer Father        brain   Asthma Brother    Asthma Daughter    Heart disease Unknown    Cancer Unknown    Lymphoma Brother    Diabetes Brother    Hypertension Brother    Heart attack Brother     Prior to Admission medications   Medication Sig Start Date End Date Taking? Authorizing Provider  acetaminophen (TYLENOL) 500 MG tablet Take 1,000 mg by mouth every 6 (six) hours as needed for moderate pain (pain).    Yes [provider]  aspirin 81 MG chewable tablet Chew 81 mg by mouth daily.   Yes [provider]  benzonatate (TESSALON) 100 MG capsule TAKE 1 CAPSULE BY MOUTH 3 TIMES A DAY AS NEEDED FOR COUGH Patient taking differently: Take 100 mg by mouth every 6 (six) hours as needed for cough. 01/04/17  Yes Young, Tarri Fuller D, MD  Calcium Citrate-Vitamin D (CITRUS CALCIUM +D PO) Take 1 tablet by mouth daily. 200-250   Yes [provider]  cephALEXin (KEFLEX) 250 MG capsule Take 250 mg by mouth at bedtime. 10/12/16  Yes [provider]  colestipol (COLESTID) 1 g tablet Take 1 g by mouth at bedtime.   Yes [provider]  furosemide (LASIX) 40 MG tablet TAKE 1 TABLET BY MOUTH IN  THE MORNING Patient taking differently: Take 40 mg by mouth daily. 11/21/19  Yes Jettie Booze, MD  levETIRAcetam  (KEPPRA) 500 MG tablet Take 250 mg by mouth 2 (two) times daily. 04/16/20  Yes [provider]  loratadine (CLARITIN) 10 MG tablet Take 10 mg by mouth daily.   Yes [provider]  meloxicam (MOBIC) 7.5 MG tablet Take 7.5 mg by mouth daily.   Yes [provider]  OVER THE COUNTER MEDICATION Take 1 capsule by mouth daily. Turnature advanced digestive   Yes [provider]  pantoprazole (PROTONIX) 40  MG tablet Take 40 mg by mouth daily. 10/02/10  Yes [provider]  potassium chloride SA (KLOR-CON) 20 MEQ tablet TAKE 1 TABLET BY MOUTH  DAILY Patient taking differently: Take 20 mEq by mouth daily. 11/21/19  Yes Jettie Booze, MD  propranolol (INDERAL) 60 MG tablet TAKE ONE-HALF TABLET BY  MOUTH TWICE DAILY Patient taking differently: Take 30 mg by mouth 2 (two) times daily. 11/21/19  Yes Jettie Booze, MD  pravastatin (PRAVACHOL) 80 MG tablet TAKE 1 TABLET BY MOUTH  DAILY Patient not taking: Reported on 07/09/2021 11/21/19   Jettie Booze, MD  Probiotic Product (ALIGN) 4 MG CAPS Take 4 mg by mouth daily. Patient not taking: Reported on 07/09/2021 01/20/13   [name], MD  warfarin (COUMADIN) 2.5 MG tablet TAKE 1/2 TO 1 TABLET BY  MOUTH DAILY AS DIRECTED BY  THE COUMADIN CLINIC Patient not taking: Reported on 07/09/2021 03/31/20   Jettie Booze, MD    Physical Exam: Vitals:   07/09/21 0602 07/09/21 0815  BP: 134/78 (!) 129/96  Pulse: 78 73  Resp: 15 15  Temp: 98.7 F (37.1 C)   SpO2: 98% 96%   Exam  Constitutional: Elderly female in no acute distress Eyes: PERRL, lids and conjunctivae normal ENMT: Mucous membranes are dry. Posterior pharynx clear of any exudate or lesions.  Hard of hearing. Neck: normal, supple Respiratory: Normal respiratory effort without significant wheezes or rhonchi appreciated.  O2 saturation currently maintained on room air. Cardiovascular: Irregular irregular.  Status post right mastectomy  and pacemaker in place. Abdomen: no abdomen tenderness appreciated at this time.  Bowel sounds appreciated all 4 quadrants. Musculoskeletal: no clubbing / cyanosis. No joint deformity upper and lower extremities. Good ROM, no contractures. Normal muscle tone.  Skin: no rashes, lesions, ulcers. No induration Neurologic: CN 2-12 grossly intact.Marland Kitchen speech is slowed Psychiatric: Alert and oriented to self.  Unable to assess for  Data Reviewed:   EKG revealed atrial fibrillation 72 bpm with QTc 530.  Reviewed labs, imaging and pertinent records as noted above in HPI.  Assessment and Plan: Subdural hematoma Acute.  Patient had noted to have complained of headache yesterday afternoon and was not her normal self prior to being found on the floor this morning.  CT imaging gave concern for a acute subdural hematoma the left and Torie measuring up to 6 mm in thickness without mass effect.  Daughter would not want any surgical intervention.  Neurosurgery have been consulted and did not feel patient needed any surgical intervention or repeat imaging. -Admit to a medical telemetry bed -Neurochecks -Goal systolic blood pressures less than 160.  Hydralazine IV as needed -Hold aspirin and DVT prophylaxis  Leukocytosis Acute.  WBC elevated 13.3.  Thought secondary to above, but also question possibility of UTI. Attempt was made to in and out cath patient, but without success.  She is currently in a hallway bed and unable to have PureWick looked up. Daughter reports that if she does have a urinary tract infection she would want treated, but is on chronic suppressive therapy.   -Recheck CBC tomorrow morning -Check urinalysis and urine culture once a  Acute metabolic encephalopathy Speech is more impaired and she is more slow to respond than usual.  Possibly related to subdural hematoma. -Neurochecks -Check TSH  Dysphagia Acute. Daughter reports patient has a issues with intermittently getting choked up while  eating. -Aspiration precautions with elevation of head of bed -Speech therapy swallow evaluation   Chronic atrial fibrillation  CHB s/p PM Patient appears to be in atrial fibrillation, but no longer on anticoagulation due to frequent falls.   She has a pacemaker in place that was last interrogated 12/08/2020 and the battery was noted to be at the low threshold/ ERI mode.  Family decided not to replace the battery. -Goal potassium at least 4 and magnesium at least 2 -Continue propranolol as tolerated  Essential hypertension Blood pressures currently maintained. -Continue propranolol -Initially held furosemide due to patient appearing somewhat dry with elevated BUN to creatinine ratio.  Resume when medically appropriate.  History of recurrent UTI Patient has had recurrent UTI and is currently on cephalexin for prophylaxis. -Follow-up urinalysis and urine culture -Continue cephalexin  History of CVA  History of seizures -Continue Keppra  History of breast cancer status post mastectomy  Prolonged QT EKG revealed QTc of 530. -Avoid QT prolonging medications   DVT prophylaxis: SCDs Advance Care Planning:   Code Status: DNR   Consults: Neurosurgery  Family Communication: Daughter updated over the phone  Severity of Illness: The appropriate patient status for this patient is OBSERVATION. Observation status is judged to be reasonable and necessary in order to provide the required intensity of service to ensure the patient's safety. The patient's presenting symptoms, physical exam findings, and initial radiographic and laboratory data in the context of their medical condition is felt to place them at decreased risk for further clinical deterioration. Furthermore, it is anticipated that the patient will be medically stable for discharge from the hospital within 2 midnights of admission.   Author: Norval Morton, MD 07/09/2021 9:34 AM  For on call review [email or web address].

## 2021-07-09 NOTE — ED Notes (Signed)
Pt is in imaging right now

## 2021-07-09 NOTE — ED Notes (Signed)
Attempted to wake up Pt to given medications.  Pt would wake to voice but promptly fell back to sleep.

## 2021-07-09 NOTE — ED Notes (Signed)
Pt noted to be sleeping soundly.  Daughter at bedside.

## 2021-07-09 NOTE — ED Notes (Signed)
Lab made aware of add-on Magnesium order.

## 2021-07-09 NOTE — ED Notes (Signed)
Multiple RNs attempted to obtain blood w/o success.  Phlebotomy asked to attempt.

## 2021-07-10 DIAGNOSIS — Z515 Encounter for palliative care: Secondary | ICD-10-CM | POA: Diagnosis not present

## 2021-07-10 DIAGNOSIS — Z7982 Long term (current) use of aspirin: Secondary | ICD-10-CM | POA: Diagnosis not present

## 2021-07-10 DIAGNOSIS — R4182 Altered mental status, unspecified: Secondary | ICD-10-CM | POA: Diagnosis present

## 2021-07-10 DIAGNOSIS — Z66 Do not resuscitate: Secondary | ICD-10-CM | POA: Diagnosis present

## 2021-07-10 DIAGNOSIS — R296 Repeated falls: Secondary | ICD-10-CM | POA: Diagnosis present

## 2021-07-10 DIAGNOSIS — Z9010 Acquired absence of unspecified breast and nipple: Secondary | ICD-10-CM | POA: Diagnosis not present

## 2021-07-10 DIAGNOSIS — Z79899 Other long term (current) drug therapy: Secondary | ICD-10-CM | POA: Diagnosis not present

## 2021-07-10 DIAGNOSIS — S065XAA Traumatic subdural hemorrhage with loss of consciousness status unknown, initial encounter: Secondary | ICD-10-CM | POA: Diagnosis not present

## 2021-07-10 DIAGNOSIS — Z9181 History of falling: Secondary | ICD-10-CM | POA: Diagnosis not present

## 2021-07-10 DIAGNOSIS — I10 Essential (primary) hypertension: Secondary | ICD-10-CM | POA: Diagnosis present

## 2021-07-10 DIAGNOSIS — I442 Atrioventricular block, complete: Secondary | ICD-10-CM | POA: Diagnosis present

## 2021-07-10 DIAGNOSIS — R569 Unspecified convulsions: Secondary | ICD-10-CM | POA: Diagnosis present

## 2021-07-10 DIAGNOSIS — Z923 Personal history of irradiation: Secondary | ICD-10-CM | POA: Diagnosis not present

## 2021-07-10 DIAGNOSIS — R1310 Dysphagia, unspecified: Secondary | ICD-10-CM | POA: Diagnosis present

## 2021-07-10 DIAGNOSIS — I482 Chronic atrial fibrillation, unspecified: Secondary | ICD-10-CM | POA: Diagnosis present

## 2021-07-10 DIAGNOSIS — E785 Hyperlipidemia, unspecified: Secondary | ICD-10-CM | POA: Diagnosis present

## 2021-07-10 DIAGNOSIS — M1990 Unspecified osteoarthritis, unspecified site: Secondary | ICD-10-CM | POA: Diagnosis present

## 2021-07-10 DIAGNOSIS — K219 Gastro-esophageal reflux disease without esophagitis: Secondary | ICD-10-CM | POA: Diagnosis present

## 2021-07-10 DIAGNOSIS — H409 Unspecified glaucoma: Secondary | ICD-10-CM | POA: Diagnosis present

## 2021-07-10 DIAGNOSIS — F0280 Dementia in other diseases classified elsewhere without behavioral disturbance: Secondary | ICD-10-CM | POA: Diagnosis present

## 2021-07-10 DIAGNOSIS — G20 Parkinson's disease: Secondary | ICD-10-CM | POA: Diagnosis present

## 2021-07-10 DIAGNOSIS — Z8673 Personal history of transient ischemic attack (TIA), and cerebral infarction without residual deficits: Secondary | ICD-10-CM | POA: Diagnosis not present

## 2021-07-10 DIAGNOSIS — Z853 Personal history of malignant neoplasm of breast: Secondary | ICD-10-CM | POA: Diagnosis not present

## 2021-07-10 DIAGNOSIS — Z791 Long term (current) use of non-steroidal anti-inflammatories (NSAID): Secondary | ICD-10-CM | POA: Diagnosis not present

## 2021-07-10 DIAGNOSIS — G9341 Metabolic encephalopathy: Secondary | ICD-10-CM | POA: Diagnosis present

## 2021-07-10 DIAGNOSIS — I6201 Nontraumatic acute subdural hemorrhage: Secondary | ICD-10-CM | POA: Diagnosis present

## 2021-07-10 LAB — BASIC METABOLIC PANEL
Anion gap: 8 (ref 5–15)
BUN: 18 mg/dL (ref 8–23)
CO2: 25 mmol/L (ref 22–32)
Calcium: 9 mg/dL (ref 8.9–10.3)
Chloride: 107 mmol/L (ref 98–111)
Creatinine, Ser: 0.86 mg/dL (ref 0.44–1.00)
GFR, Estimated: >60 mL/min (ref >60)
Glucose, Bld: 95 mg/dL (ref 70–99)
Potassium: 3.4 mmol/L — ABNORMAL LOW (ref 3.5–5.1)
Sodium: 140 mmol/L (ref 135–145)

## 2021-07-10 LAB — CBC
HCT: 36.9 % (ref 36.0–46.0)
Hemoglobin: 12.2 g/dL (ref 12.0–15.0)
MCH: 29.1 pg (ref 26.0–34.0)
MCHC: 33.1 g/dL (ref 30.0–36.0)
MCV: 88.1 fL (ref 80.0–100.0)
Platelets: 121 10*3/uL — ABNORMAL LOW (ref 150–400)
RBC: 4.19 MIL/uL (ref 3.87–5.11)
RDW: 13.4 % (ref 11.5–15.5)
WBC: 9.1 10*3/uL (ref 4.0–10.5)
nRBC: 0 % (ref 0.0–0.2)

## 2021-07-10 LAB — TSH: TSH: 1.517 u[IU]/mL (ref 0.350–4.500)

## 2021-07-10 MED ORDER — LORAZEPAM 2 MG/ML IJ SOLN
1.0000 mg | Freq: Four times a day (QID) | INTRAMUSCULAR | Status: DC | PRN
  Administered 2021-07-10 – 2021-07-11 (×2): 1 mg via INTRAVENOUS
  Filled 2021-07-10 (×2): qty 1

## 2021-07-10 NOTE — Progress Notes (Signed)
[facility] 3G18 [facility]) Hospitalized Hospice Patient  Debra Lynn is a current hospice patient with a terminal diagnosis of cerebrovascular disease. She was noted to be increasingly confused at her facility and continually complaining of a new onset of HA. Hospice RN made visit last night and the plan was to re-evaluate in the AM, however pt then sustained a fall and was sent to the ED for evaluation. She is being admitted with altered mental status and acute SDH. This is a related hospital admission per Dr. Gildardo Cranker, [facility] MD  Met with patient at bedside and spoke to daughter Debra Lynn over the phone. She continues to wish for no further surgical interventions. Uti resulted negative. Her daughter would like her evaluated for beacon place. Patient has intermittent confusion, can be restless at times-pulling at [facility]. Her daughter stated that she was seeing family members that have been gone for some time now. During my visit she seemed to have some aphasia but was able to answer appropriately. Spoke to hospice MD, we can re-evaluate patient tomorrow, but at this current time patient is not appropriate.   VS: 154/75, 63HR, 13RR, 94RA I&O:? I see 166m of urine in canister  Labs: K 3.4, PLTs 121 Diagnostics: CT head - Acute subdural hematoma along the left tentorium measuring up to 6 mm in thickness IV/PRN: IV fluid bolus 6/8, none currently  Problem List: Subdural hematoma - [facility] acute subdural hematoma along left tentorium measuring 6 mm in thickness.  No significant mass effect" -Case was discussed with neurosurgery and also goals of care with family.  Decision made to not pursue any repeat imaging.  Family also wishing for beacon place evaluation as they do not think she would be able to return back to assisted living -Okay to DC cardiac monitoring to limit agitation - focus is comfort care; patient on hospice prior to admission    Leukocytosis -Possibly reactive from fall and SDH.  Low suspicion for infection at this time.  Urinalysis negative for signs of infection but will follow-up culture in case.  She otherwise appears nontoxic - Continue trending WBC and monitoring for any development of fever   Acute metabolic encephalopathy -Mentation remains slowed more than usual per daughter and patient also remains grossly confused   Dysphagia -Daughter reported intermittent problems with difficulty swallowing recently -Speech therapy swallow evaluation    Chronic atrial fibrillation CHB s/p PM - Patient appears to be in atrial fibrillation, but no longer on anticoagulation due to frequent falls. - She has a pacemaker in place that was last interrogated 12/08/2020 and the battery was noted to be at the low threshold/ ERI mode.  Family decided not to replace the battery. -Goal potassium at least 4 and magnesium at least 2 -Continue propranolol as tolerated   Essential hypertension Blood pressures currently maintained. -Continue propranolol - continue holding lasix    History of recurrent UTI Patient has had recurrent UTI and is currently on cephalexin for prophylaxis. -Follow-up urinalysis and urine culture -Continue cephalexin   History of CVA   History of seizures -Continue Keppra   History of breast cancer status post mastectomy   Prolonged QT EKG revealed QTc of 530. -Avoid QT prolonging medications  She is inpatient appropriate for evaluation of acute SDH with altered mental status.  GOC: Clear. DNR. No surgical intervention. Family is open to exploring cause of leukocytosis but wants to be specific what they choose to treat.  D/C planning: Ongoing. At this time we will re-evaluate patient tomorrow for [facility] place, but not appropriate as of 6/9. Family: Discussed with Cindy at the bedside. IDT: Hospice team updated.  Clementeen Hoof, [place], BSN [facility] Liaison [phone]

## 2021-07-10 NOTE — Plan of Care (Signed)
Pt is alert x 1-2. Pt took medications PO with out complication. Pt pulled IV out and has been trying to get out of bed. Bed alram on.  Pt has purewick in place for urine output.    Problem: Education: Goal: Knowledge of General Education information will improve Description: Including pain rating scale, medication(s)/side effects and non-pharmacologic comfort measures Outcome: Progressing   Problem: Health Behavior/Discharge Planning: Goal: Ability to manage health-related needs will improve Outcome: Progressing   Problem: Clinical Measurements: Goal: Ability to maintain clinical measurements within normal limits will improve Outcome: Progressing Goal: Will remain free from infection Outcome: Progressing Goal: Diagnostic test results will improve Outcome: Progressing Goal: Respiratory complications will improve Outcome: Progressing Goal: Cardiovascular complication will be avoided Outcome: Progressing   Problem: Activity: Goal: Risk for activity intolerance will decrease Outcome: Progressing   Problem: Nutrition: Goal: Adequate nutrition will be maintained Outcome: Progressing   Problem: Coping: Goal: Level of anxiety will decrease Outcome: Progressing   Problem: Elimination: Goal: Will not experience complications related to bowel motility Outcome: Progressing Goal: Will not experience complications related to urinary retention Outcome: Progressing   Problem: Pain Managment: Goal: General experience of comfort will improve Outcome: Progressing   Problem: Safety: Goal: Ability to remain free from injury will improve Outcome: Progressing   Problem: Skin Integrity: Goal: Risk for impaired skin integrity will decrease Outcome: Progressing

## 2021-07-10 NOTE — Progress Notes (Signed)
Progress Note    Debra Lynn   FBP:[id number]  DOB: 01/18/1929  DOA: 07/09/2021     0 PCP: Kathyrn Lass, MD  Initial CC: [facility] Course: [name] is a 88 yo female with PMH HTN, HLD, CVA, A-fib no longer on anticoagulation due to frequent falls, CHB s/p pacemaker, Parkinson's disease who is cared for by hospice at her assisted living. She was brought to the hospital after being found down on the floor with concern for a possible fall. Multiple imaging studies were performed.  Her CT head notably showed acute subdural hematoma along left tentorium measuring 6 mm in thickness with no significant mass effect. Case was discussed with neurosurgery and family.  Family did not wish for any surgical intervention or aggressive measures.  She was not felt to warrant any further follow-up imaging after discussions. Family requested for evaluation from hospice in case of appropriate for [facility].  Interval History:  Seen this morning multiple times.  Daughter also present bedside during rounding.  She endorsed that the patient's mentation was still slowed and she was still grossly confused.  She did however eat her breakfast and was able to feed herself.  Assessment and Plan:  Subdural hematoma - presumed due to fall at ALF [facility] shows " acute subdural hematoma along left tentorium measuring 6 mm in thickness.  No significant mass effect" -Case was discussed with neurosurgery and also goals of care with family.  Decision made to not pursue any repeat imaging.  Family also wishing for [facility] evaluation as they do not think she would be able to return back to assisted living -Okay to DC cardiac monitoring to limit agitation - focus is comfort care; patient on hospice prior to admission   Leukocytosis -Possibly reactive from fall and SDH.  Low suspicion for infection at this time.  Urinalysis negative for signs of infection but will follow-up culture in case.  She otherwise  appears nontoxic - Continue trending WBC and monitoring for any development of fever   Acute metabolic encephalopathy -Mentation remains slowed more than usual per daughter and patient also remains grossly confused   Dysphagia -Daughter reported intermittent problems with difficulty swallowing recently -Speech therapy swallow evaluation    Chronic atrial fibrillation CHB s/p PM - Patient appears to be in atrial fibrillation, but no longer on anticoagulation due to frequent falls. - She has a pacemaker in place that was last interrogated 12/08/2020 and the battery was noted to be at the low threshold/ ERI mode.  Family decided not to replace the battery. -Goal potassium at least 4 and magnesium at least 2 -Continue propranolol as tolerated   Essential hypertension Blood pressures currently maintained. -Continue propranolol - continue holding lasix    History of recurrent UTI Patient has had recurrent UTI and is currently on cephalexin for prophylaxis. -Follow-up urinalysis and urine culture -Continue cephalexin   History of CVA   History of seizures -Continue Keppra   History of breast cancer status post mastectomy   Prolonged QT EKG revealed QTc of 530. -Avoid QT prolonging medications    Old records reviewed in assessment of this patient  Antimicrobials: Chronic keflex  DVT prophylaxis:  SCDs Start: 07/09/21 0945   Code Status:   Code Status: DNR  Disposition Plan:   Status is: Inpt  Objective: Blood pressure (!) 154/75, pulse 63, temperature 98.2 F (36.8 C), temperature source Oral, resp. rate 13, SpO2 94 %.  Examination:  Physical Exam Constitutional:  Comments: Chronically ill-appearing elderly woman lying in bed in no distress with slowed mentation and gross confusion.  HENT:     Head: Normocephalic and atraumatic.     Mouth/Throat:     Mouth: Mucous membranes are moist.  Eyes:     Extraocular Movements: Extraocular movements intact.      Pupils: Pupils are equal, round, and reactive to light.  Cardiovascular:     Rate and Rhythm: Normal rate. Rhythm irregular.  Pulmonary:     Effort: Pulmonary effort is normal.     Breath sounds: Normal breath sounds.  Abdominal:     General: Bowel sounds are normal. There is no distension.     Palpations: Abdomen is soft.     Tenderness: There is no abdominal tenderness.  Musculoskeletal:        General: Normal range of motion.     Cervical back: Normal range of motion and neck supple.  Skin:    General: Skin is warm and dry.  Neurological:     Comments: Moves all 4 extremities and follows commands.  Oriented to name and president.  Did not know place or year      Consultants:    Procedures:    Data Reviewed: Results for orders placed or performed during the hospital encounter of 07/09/21 (from the past 24 hour(s))  Urinalysis, Routine w reflex microscopic     Status: Abnormal   Collection Time: 07/09/21  9:14 PM  Result Value Ref Range   Color, Urine YELLOW YELLOW   APPearance CLEAR CLEAR   Specific Gravity, Urine 1.019 1.005 - 1.030   pH 5.0 5.0 - 8.0   Glucose, UA NEGATIVE NEGATIVE mg/dL   Hgb urine dipstick NEGATIVE NEGATIVE   Bilirubin Urine NEGATIVE NEGATIVE   Ketones, ur NEGATIVE NEGATIVE mg/dL   Protein, ur 30 (A) NEGATIVE mg/dL   Nitrite NEGATIVE NEGATIVE   Leukocytes,Ua NEGATIVE NEGATIVE   RBC / HPF 0-5 0 - 5 RBC/hpf   WBC, UA 0-5 0 - 5 WBC/hpf   Bacteria, UA NONE SEEN NONE SEEN   Squamous Epithelial / LPF 0-5 0 - 5  Magnesium     Status: None   Collection Time: 07/09/21  9:58 PM  Result Value Ref Range   Magnesium 2.0 1.7 - 2.4 mg/dL  CBC     Status: Abnormal   Collection Time: 07/10/21  2:26 AM  Result Value Ref Range   WBC 9.1 4.0 - 10.5 K/uL   RBC 4.19 3.87 - 5.11 MIL/uL   Hemoglobin 12.2 12.0 - 15.0 g/dL   HCT 36.9 36.0 - 46.0 %   MCV 88.1 80.0 - 100.0 fL   MCH 29.1 26.0 - 34.0 pg   MCHC 33.1 30.0 - 36.0 g/dL   RDW 13.4 11.5 - 15.5 %    Platelets 121 (L) 150 - 400 K/uL   nRBC 0.0 0.0 - 0.2 %  Basic metabolic panel     Status: Abnormal   Collection Time: 07/10/21  2:26 AM  Result Value Ref Range   Sodium 140 135 - 145 mmol/L   Potassium 3.4 (L) 3.5 - 5.1 mmol/L   Chloride 107 98 - 111 mmol/L   CO2 25 22 - 32 mmol/L   Glucose, Bld 95 70 - 99 mg/dL   BUN 18 8 - 23 mg/dL   Creatinine, Ser 0.86 0.44 - 1.00 mg/dL   Calcium 9.0 8.9 - 10.3 mg/dL   GFR, Estimated >60 >60 mL/min   Anion gap 8 5 - 15  TSH     Status: None   Collection Time: 07/10/21  2:26 AM  Result Value Ref Range   TSH 1.517 0.350 - 4.500 uIU/mL   *Note: Due to a large number of results and/or encounters for the requested time period, some results have not been displayed. A complete set of results can be found in Results Review.    I have Reviewed nursing notes, Vitals, and Lab results since pt's last encounter. Pertinent lab results : see above I have ordered test including BMP, CBC, Mg I have reviewed the last note from staff over past 24 hours I have discussed pt's care plan and test results with nursing staff, case manager   LOS: 0 days   Dwyane Dee, MD [facility] 07/10/2021, 12:03 PM

## 2021-07-10 NOTE — Evaluation (Signed)
Clinical/Bedside Swallow Evaluation Patient Details  Name: Debra Lynn MRN: [id number] Date of Birth: October 28, 1928  Today's Date: 07/10/2021 Time: SLP Start Time (ACUTE ONLY): 0840 SLP Stop Time (ACUTE ONLY): 0901 SLP Time Calculation (min) (ACUTE ONLY): 21 min  Past Medical History:  Past Medical History:  Diagnosis Date   Acute ischemic stroke ([place]) 08/23/2012   Acute, but ill-defined, cerebrovascular disease 11/08/2012   Arthritis     Atrial fibrillation ([place])    a. s/p AVN ablation   Cancer ([place]) 12/2010   breast with recurrence s/p recent XRT   Complete heart block ([place])    a. s/p STJ CRTP   Compression fracture         CVA 05/07/2008       Fibrocystic breast    GERD (gastroesophageal reflux disease)    Glaucoma    Hyperlipidemia    Hypertension    Orthostatic hypotension    Overactive bladder    Parkinsonism ([place])    Pericarditis        Renal neoplasm 10/03/10   TUMOR ON RIGHT KIDNEY-OBSERVING   Seasonal allergic rhinitis    Pos Skin Test 07-05-07   Seizures ([place]) 2013       Thoracic aneurysm without mention of rupture    Past Surgical History:  Past Surgical History:  Procedure Laterality Date   ABDOMINAL HYSTERECTOMY  1975   pt. unsure if laparascopic/vaginal/abdominal   ABLATION  1980s   AVN ablation    APPENDECTOMY     BI-VENTRICULAR PACEMAKER UPGRADE  01/2007   3rd device, upgraded to SJM Frontier II BiV pacemaker by Dr Leonia Reeves   BREAST SURGERY Right 01/15/11   mastectomy   CATARACT EXTRACTION Bilateral    CHOLECYSTECTOMY     EP IMPLANTABLE DEVICE N/A 06/27/2014   CRT-P  ([facility]) gen change by Dr Rayann Heman   EYE SURGERY     For glaucoma   FLEXIBLE BRONCHOSCOPY W/ UPPER ENDOSCOPY     and swallowing evaluation   KNEE ARTHROSCOPY Right    Arthroscopic for torn meniscus   MASS EXCISION Right 01/19/2013   Procedure: EXCISION CHEST WALL MASS;  Surgeon: [name], MD;  Location: [facility]S;  Service: General;  Laterality: Right;   PACEMAKER INSERTION      1992; gen change 2004   VESICOVAGINAL FISTULA CLOSURE W/ TAH     HPI:  Pt is a 88 y.o. female who presents after being being noted with increased confusion, complaints of HA and then sustaining a fall. Head CT (07/09/21) revealed "Acute subdural hematoma along the left tentorium".  MBS (2012) with no report of findings found in EMR. Esophagram (2013) revealed "small hiatal hernia with mild reflux. Mild tertiary contractions in the mid and distal esophagus". Daughter reports pt has issues with intermittently getting choked up while eating. BSE requested. PMH: hypertension, hyperlipidemia, CVA, atrial fibrillation no longer on anticoagulation due to frequent falls, CHB s/p pacemaker, Parkinson's disease.    Assessment / Plan / Recommendation  Clinical Impression  Pt presents with functional oropharyngeal swallow, a bedside, with mild oral deficits. Oral mechanism examination unremarkable and pt presents with upper partial along with adequate natural dentition. She self-fed sips of thin liquids by cup/straw exhibiting no overt s/sx of aspiration across a series of trials. Puree and regular textures were orally cleared, although of note was prolonged mastication/bolus formation of regular textures despite small controlled bites. Recommend initiation of dys 3 (mechanical soft) diet with thin liquids. SLP services to f/u for tolerance given recent incidences of  trouble swallowing per daughter (see MD note 6/8). Above reported to RN.  SLP Visit Diagnosis: Dysphagia, unspecified (R13.10)    Aspiration Risk  Mild aspiration risk    Diet Recommendation Dysphagia 3 (Mech soft);Thin liquid   Liquid Administration via: Cup;Straw Medication Administration: Whole meds with liquid Supervision: Patient able to self feed;Full supervision/cueing for compensatory strategies;Intermittent supervision to cue for compensatory strategies Compensations: Minimize environmental distractions;Small sips/bites;Slow rate;Follow  solids with liquid Postural Changes: Seated upright at 90 degrees;Remain upright for at least 30 minutes after po intake    Other  Recommendations Oral Care Recommendations: Oral care BID;Staff/trained caregiver to provide oral care    Recommendations for follow up therapy are one component of a multi-disciplinary discharge planning process, led by the attending physician.  Recommendations may be updated based on patient status, additional functional criteria and insurance authorization.  Follow up Recommendations Other (comment) (TBD)      Assistance Recommended at Discharge Frequent or constant Supervision/Assistance  Functional Status Assessment Patient has had a recent decline in their functional status and demonstrates the ability to make significant improvements in function in a reasonable and predictable amount of time.  Frequency and Duration min 2x/week  2 weeks       Prognosis Prognosis for Safe Diet Advancement: Good Barriers to Reach Goals: Time post onset      Swallow Study   General Date of Onset: 07/09/21 HPI: Pt is a 88 y.o. female who presents after being being noted with increased confusion, complaints of HA and then sustaining a fall. Head CT (07/09/21) revealed "Acute subdural hematoma along the left tentorium".  MBS (2012) with no report of findings found in EMR. Esophagram (2013) revealed "small hiatal hernia with mild reflux. Mild tertiary contractions in the mid and distal esophagus". Daughter reports pt has issues with intermittently getting choked up while eating. BSE requested. PMH: hypertension, hyperlipidemia, CVA, atrial fibrillation no longer on anticoagulation due to frequent falls, CHB s/p pacemaker, Parkinson's disease. Type of Study: Bedside Swallow Evaluation Previous Swallow Assessment: see HPI Diet Prior to this Study: NPO Temperature Spikes Noted: No Respiratory Status: Room air History of Recent Intubation: No Behavior/Cognition: Alert;Pleasant  mood;Cooperative;Confused Oral Cavity Assessment: Within Functional Limits Oral Care Completed by SLP: Yes Oral Cavity - Dentition: Other (Comment);Adequate natural dentition (partial, top) Vision: Functional for self-feeding Self-Feeding Abilities: Able to feed self Patient Positioning: Upright in bed;Postural control adequate for testing Baseline Vocal Quality: Normal Volitional Cough: Strong Volitional Swallow: Able to elicit    Oral/Motor/Sensory Function Overall Oral Motor/Sensory Function: Within functional limits   Ice Chips Ice chips: Within functional limits Presentation: Spoon   Thin Liquid Thin Liquid: Within functional limits Presentation: Cup;Straw;Self Fed    Nectar Thick Nectar Thick Liquid: Not tested   Honey Thick Honey Thick Liquid: Not tested   Puree Puree: Within functional limits Presentation: Spoon;Self Fed   Solid     Solid: Impaired Presentation: Self Fed Oral Phase Impairments: Impaired mastication Oral Phase Functional Implications: Impaired mastication;Prolonged oral transit       Ellwood Dense, [place], [place] Office Number: [phone]  Acie Fredrickson 07/10/2021,9:26 AM

## 2021-07-10 NOTE — Evaluation (Signed)
Physical Therapy Evaluation Patient Details Name: Debra Lynn MRN: [id number] DOB: 10-27-1928 Today's Date: 07/10/2021  History of Present Illness  Pt  adm 6/8 after fall at ALF with resultant lt SDH. Pt followed by outpatient hospice. PMH - HTN, CVA, afib, frequent falls, pacer, Parkinsons  Clinical Impression  Pt presents to PT with a recent decline in mobility and a history of falls. Mobility today is better than it was just prior to admission but is not back to baseline. Recommend return to familiar environment of ALF if they can provide assist with mobility.        Recommendations for follow up therapy are one component of a multi-disciplinary discharge planning process, led by the attending physician.  Recommendations may be updated based on patient status, additional functional criteria and insurance authorization.  Follow Up Recommendations Home health PT (at ALF)    Assistance Recommended at Discharge Frequent or constant Supervision/Assistance  Patient can return home with the following  A little help with walking and/or transfers;A little help with bathing/dressing/bathroom    Equipment Recommendations None recommended by PT  Recommendations for Other Services       Functional Status Assessment Patient has had a recent decline in their functional status and demonstrates the ability to make significant improvements in function in a reasonable and predictable amount of time.     Precautions / Restrictions Precautions Precautions: Fall      Mobility  Bed Mobility Overal bed mobility: Needs Assistance Bed Mobility: Supine to Sit     Supine to sit: Mod assist, HOB elevated     General bed mobility comments: Assist to bring legs off of bed, elevate trunk into sitting and bring hips to EOB.    Transfers Overall transfer level: Needs assistance Equipment used: Rollator (4 wheels) Transfers: Sit to/from Stand Sit to Stand: Mod assist, Min assist            General transfer comment: Initial sit to stand with mod assist to bring hips up and to correct posterior lean. Verbal cues to extend hips/knees further    Ambulation/Gait Ambulation/Gait assistance: Min assist Gait Distance (Feet): 150 Feet Assistive device: Rollator (4 wheels) Gait Pattern/deviations: Step-through pattern, Decreased step length - right, Decreased step length - left, Knee flexed in stance - right, Knee flexed in stance - left, Trunk flexed Gait velocity: decr Gait velocity interpretation: <1.8 ft/sec, indicate of risk for recurrent falls   General Gait Details: Assist for balance and support. Pt with improved hip and knee extension as distance increasd  Stairs            Wheelchair Mobility    Modified Rankin (Stroke Patients Only)       Balance Overall balance assessment: Needs assistance Sitting-balance support: No upper extremity supported, Feet supported Sitting balance-Leahy Scale: Fair     Standing balance support: Bilateral upper extremity supported, During functional activity Standing balance-Leahy Scale: Poor Standing balance comment: walker and initial mod assist improving to min assist                             Pertinent Vitals/Pain Pain Assessment Pain Assessment: No/denies pain    Home Living Family/patient expects to be discharged to:: Assisted living                 Home Equipment: Rollator (4 wheels) (3 wheeled walker)      Prior Function Prior Level of Function : Needs assist  Physical Assist : Mobility (physical) Mobility (physical): Gait   Mobility Comments: Pt amb in room at ALF with her walker with modified independent with frequent falls. Staff takes to dining room in transport chair.       Hand Dominance   Dominant Hand: Right    Extremity/Trunk Assessment   Upper Extremity Assessment Upper Extremity Assessment: Generalized weakness    Lower Extremity Assessment Lower Extremity  Assessment: Generalized weakness       Communication   Communication: HOH  Cognition Arousal/Alertness: Awake/alert Behavior During Therapy: WFL for tasks assessed/performed Overall Cognitive Status: History of cognitive impairments - at baseline                                          General Comments      Exercises     Assessment/Plan    PT Assessment Patient needs continued PT services  PT Problem List Decreased strength;Decreased activity tolerance;Decreased balance;Decreased mobility       PT Treatment Interventions DME instruction;Gait training;Functional mobility training;Therapeutic activities;Therapeutic exercise;Balance training;Patient/family education    PT Goals (Current goals can be found in the Care Plan section)  Acute Rehab PT Goals Patient Stated Goal: not stated PT Goal Formulation: Patient unable to participate in goal setting Time For Goal Achievement: 07/24/21 Potential to Achieve Goals: Good    Frequency Min 3X/week     Co-evaluation               AM-PAC PT "6 Clicks" Mobility  Outcome Measure Help needed turning from your back to your side while in a flat bed without using bedrails?: A Lot Help needed moving from lying on your back to sitting on the side of a flat bed without using bedrails?: A Lot Help needed moving to and from a bed to a chair (including a wheelchair)?: A Lot Help needed standing up from a chair using your arms (e.g., wheelchair or bedside chair)?: A Lot Help needed to walk in hospital room?: A Little Help needed climbing 3-5 steps with a railing? : A Little 6 Click Score: 14    End of Session Equipment Utilized During Treatment: Gait belt Activity Tolerance: Patient tolerated treatment well Patient left: in chair;with call bell/phone within reach;with chair alarm set Nurse Communication: Mobility status PT Visit Diagnosis: Unsteadiness on feet (R26.81);Other abnormalities of gait and mobility  (R26.89);Repeated falls (R29.6);Muscle weakness (generalized) (M62.81);History of falling (Z91.81)    Time: 1500-1531 PT Time Calculation (min) (ACUTE ONLY): 31 min   Charges:   PT Evaluation $PT Eval Moderate Complexity: 1 Mod PT Treatments $Gait Training: 8-22 mins        [place] Office [place] 07/10/2021, 4:53 PM

## 2021-07-10 NOTE — TOC CAGE-AID Note (Signed)
Transition of Care [facility]) - CAGE-AID Screening   Patient Details  Name: Debra Lynn MRN: [id number] Date of Birth: [birth year]  Transition of Care [facility]) CM/SW Contact:    Jamaris Theard C Tarpley-Carter, [place] Phone Number: 07/10/2021, 9:36 AM   Clinical Narrative: Pt is unable to participate in Cage Aid. Pt is experiencing dementia.  Taven Strite Tarpley-Carter, MSW, LCSW-A Pronouns:  She/Her/Hers [facility] HealthTransitions of Care Clinical Social Worker Direct Number:  [phone] [email or web address]  CAGE-AID Screening: Substance Abuse Screening unable to be completed due to: : Patient unable to participate

## 2021-07-10 NOTE — Hospital Course (Signed)
Ms. Pho is a 88 yo female with PMH HTN, HLD, CVA, A-fib no longer on anticoagulation due to frequent falls, CHB s/p pacemaker, Parkinson's disease who is cared for by hospice at her assisted living. She was brought to the hospital after being found down on the floor with concern for a possible fall. Multiple imaging studies were performed.  Her CT head notably showed acute subdural hematoma along left tentorium measuring 6 mm in thickness with no significant mass effect. Case was discussed with neurosurgery and family.  Family did not wish for any surgical intervention or aggressive measures.  She was not felt to warrant any further follow-up imaging after discussions. Family requested for evaluation from hospice in case of appropriate for [facility].

## 2021-07-11 LAB — URINE CULTURE

## 2021-07-11 NOTE — Plan of Care (Signed)
  Problem: Pain Managment: Goal: General experience of comfort will improve Outcome: Progressing   Problem: Safety: Goal: Ability to remain free from injury will improve Outcome: Progressing   Problem: Skin Integrity: Goal: Risk for impaired skin integrity will decrease Outcome: Progressing   

## 2021-07-11 NOTE — Discharge Summary (Signed)
Physician Discharge Summary   Debra Lynn OZD:[id number] DOB: [birth year] DOA: 07/09/2021  PCP: Kathyrn Lass, MD  Admit date: 07/09/2021 Discharge date: 07/11/2021  Admitted From: Home Disposition:  [place] Discharging physician: Dwyane Dee, MD  Recommendations for Outpatient Follow-up:  Continue comfort care  Discharge Condition: poor CODE STATUS: DNR Diet recommendation:  Diet Orders (From admission, onward)     Start     Ordered   07/10/21 0903  DIET DYS 3 Room service appropriate? No; Fluid consistency: Thin  Diet effective now       Question Answer Comment  Room service appropriate? No   Fluid consistency: Thin      07/10/21 0903            Hospital Course: Ms. Debra Lynn is a 88 yo female with PMH HTN, HLD, CVA, A-fib no longer on anticoagulation due to frequent falls, CHB s/p pacemaker, Parkinson's disease who is cared for by hospice at her assisted living. She was brought to the hospital after being found down on the floor with concern for a possible fall. Multiple imaging studies were performed.  Her CT head notably showed acute subdural hematoma along left tentorium measuring 6 mm in thickness with no significant mass effect. Case was discussed with neurosurgery and family.  Family did not wish for any surgical intervention or aggressive measures.  She was not felt to warrant any further follow-up imaging after discussions. Family requested for evaluation from hospice in case of appropriate for [facility].  Assessment and Plan:  Subdural hematoma - presumed due to fall at ALF [facility] shows " acute subdural hematoma along left tentorium measuring 6 mm in thickness.  No significant mass effect" -Case was discussed with neurosurgery and also goals of care with family.  Decision made to not pursue any repeat imaging.  Family also wishing for [facility] evaluation as they do not think she would be able to return back to assisted living -Okay to DC cardiac  monitoring to limit agitation - focus is comfort care; patient on hospice prior to admission - discharging to [facility]   Leukocytosis -Possibly reactive from fall and SDH.  Low suspicion for infection at this time.  Urinalysis negative for signs of infection.  She otherwise appears nontoxic - normalized; no further workup    Acute metabolic encephalopathy -Mentation remains slowed more than usual per daughter and patient also remains grossly confused   Dysphagia -Daughter reported intermittent problems with difficulty swallowing recently -evaluated by SLP; continue on Dysphagia 3 diet    Chronic atrial fibrillation CHB s/p PM - Patient appears to be in atrial fibrillation, but no longer on anticoagulation due to frequent falls. - She has a pacemaker in place that was last interrogated 12/08/2020 and the battery was noted to be at the low threshold/ ERI mode.  Family decided not to replace the battery. -Continue propranolol as tolerated   Essential hypertension Blood pressures currently maintained. -Continue propranolol - continue holding lasix    History of recurrent UTI - given transition to comfort care; d/c chronic outpatient keflex   History of CVA   History of seizures -Continue Keppra   History of breast cancer status post mastectomy   Prolonged QT EKG revealed QTc of 530. -Avoid QT prolonging medications    Principal Diagnosis: SDH (subdural hematoma) (HCC)  Discharge Diagnoses: Principal Problem:   SDH (subdural hematoma) (HCC) Active Problems:   Leukocytosis   Acute metabolic encephalopathy   Dysphagia   Complete heart block ([place])  Pacemaker   Atrial fibrillation (HCC)   HTN (hypertension)   History of recurrent UTIs   History of seizures   Prolonged QT interval    Allergies as of 07/11/2021   No Known Allergies      Medication List     STOP taking these medications    acetaminophen 500 MG tablet Commonly known as: TYLENOL   Align 4  MG Caps   aspirin 81 MG chewable tablet   benzonatate 100 MG capsule Commonly known as: TESSALON   cephALEXin 250 MG capsule Commonly known as: KEFLEX   CITRUS CALCIUM +D PO   colestipol 1 g tablet Commonly known as: COLESTID   furosemide 40 MG tablet Commonly known as: LASIX   loratadine 10 MG tablet Commonly known as: CLARITIN   meloxicam 7.5 MG tablet Commonly known as: MOBIC   OVER THE COUNTER MEDICATION   pantoprazole 40 MG tablet Commonly known as: PROTONIX   potassium chloride SA 20 MEQ tablet Commonly known as: KLOR-CON M   pravastatin 80 MG tablet Commonly known as: PRAVACHOL       TAKE these medications    levETIRAcetam 500 MG tablet Commonly known as: KEPPRA Take 250 mg by mouth 2 (two) times daily.   propranolol 60 MG tablet Commonly known as: INDERAL TAKE ONE-HALF TABLET BY  MOUTH TWICE DAILY        No Known Allergies  Consultations:   Procedures:   Discharge Exam: BP (!) 104/51 (BP Location: Left Arm)   Pulse 61   Temp 98.1 F (36.7 C) (Oral)   Resp 16   SpO2 93%  Physical Exam Constitutional:      Comments: Chronically ill-appearing elderly woman laying in bed more agitated and harder to re-direct   HENT:     Head: Normocephalic and atraumatic.     Mouth/Throat:     Mouth: Mucous membranes are moist.  Eyes:     Extraocular Movements: Extraocular movements intact.     Pupils: Pupils are equal, round, and reactive to light.  Cardiovascular:     Rate and Rhythm: Normal rate. Rhythm irregular.  Pulmonary:     Effort: Pulmonary effort is normal.     Breath sounds: Normal breath sounds.  Abdominal:     General: Bowel sounds are normal. There is no distension.     Palpations: Abdomen is soft.     Tenderness: There is no abdominal tenderness.  Musculoskeletal:        General: Normal range of motion.     Cervical back: Normal range of motion and neck supple.  Skin:    General: Skin is warm and dry.  Neurological:      Comments: Moves all 4 extremities; more agitated today      The results of significant diagnostics from this hospitalization (including imaging, microbiology, ancillary and laboratory) are listed below for reference.   Microbiology: Recent Results (from the past 240 hour(s))  Urine Culture     Status: Abnormal   Collection Time: 07/09/21  9:14 PM   Specimen: Urine, Clean Catch  Result Value Ref Range Status   Specimen Description URINE, CLEAN CATCH  Final   Special Requests   Final    NONE Performed at [facility], [place] [address], [place], [place] [address]    Culture MULTIPLE SPECIES PRESENT, SUGGEST RECOLLECTION (A)  Final   Report Status 07/11/2021 FINAL  Final     Labs: BNP (last 3 results) No results for input(s): "BNP" in the last  8760 hours. Basic Metabolic Panel: Recent Labs  Lab 07/09/21 0804 07/09/21 2158 07/10/21 0226  NA 138  --  140  K 4.4  --  3.4*  CL 106  --  107  CO2 22  --  25  GLUCOSE 109*  --  95  BUN 19  --  18  CREATININE 0.99  --  0.86  CALCIUM 8.9  --  9.0  MG  --  2.0  --    Liver Function Tests: Recent Labs  Lab 07/09/21 0804  AST 29  ALT 12  ALKPHOS 30*  BILITOT 0.9  PROT 6.4*  ALBUMIN 3.8   No results for input(s): "LIPASE", "AMYLASE" in the last 168 hours. Recent Labs  Lab 07/09/21 0804  AMMONIA 22   CBC: Recent Labs  Lab 07/09/21 0804 07/10/21 0226  WBC 13.3* 9.1  HGB 13.4 12.2  HCT 41.0 36.9  MCV 90.3 88.1  PLT 150 121*   Cardiac Enzymes: Recent Labs  Lab 07/09/21 0804  CKTOTAL 326*   BNP: Invalid input(s): "POCBNP" CBG: No results for input(s): "GLUCAP" in the last 168 hours. D-Dimer No results for input(s): "DDIMER" in the last 72 hours. Hgb A1c No results for input(s): "HGBA1C" in the last 72 hours. Lipid Profile No results for input(s): "CHOL", "HDL", "LDLCALC", "TRIG", "CHOLHDL", "LDLDIRECT" in the last 72 hours. Thyroid function studies Recent Labs    07/10/21 0226  TSH 1.517   Anemia  work up No results for input(s): "VITAMINB12", "FOLATE", "FERRITIN", "TIBC", "IRON", "RETICCTPCT" in the last 72 hours. Urinalysis    Component Value Date/Time   COLORURINE YELLOW 07/09/2021 2114   APPEARANCEUR CLEAR 07/09/2021 2114   LABSPEC 1.019 07/09/2021 2114   PHURINE 5.0 07/09/2021 2114   GLUCOSEU NEGATIVE 07/09/2021 2114   HGBUR NEGATIVE 07/09/2021 2114   [place] NEGATIVE 07/09/2021 2114   [place] NEGATIVE 07/09/2021 2114   PROTEINUR 30 (A) 07/09/2021 2114   UROBILINOGEN 0.2 12/14/2013 1608   NITRITE NEGATIVE 07/09/2021 2114   LEUKOCYTESUR NEGATIVE 07/09/2021 2114   Sepsis Labs Recent Labs  Lab 07/09/21 0804 07/10/21 0226  WBC 13.3* 9.1   Microbiology Recent Results (from the past 240 hour(s))  Urine Culture     Status: Abnormal   Collection Time: 07/09/21  9:14 PM   Specimen: Urine, Clean Catch  Result Value Ref Range Status   Specimen Description URINE, CLEAN CATCH  Final   Special Requests   Final    NONE Performed at [facility], [place] [address], [place], [place] [address]    Culture MULTIPLE SPECIES PRESENT, SUGGEST RECOLLECTION (A)  Final   Report Status 07/11/2021 FINAL  Final    Procedures/Studies: DG Hand Complete Right  Result Date: 07/09/2021 CLINICAL DATA:  Status post fall. Bruising to first metacarpal phalangeal joint. EXAM: RIGHT HAND - COMPLETE 3+ VIEW COMPARISON:  None Available. FINDINGS: Bones appear diffusely osteopenic. Degenerative changes noted scratch mild scattered degenerative changes are noted. Most severe at the basilar joint. No acute fracture or dislocation identified. Soft tissues are unremarkable. IMPRESSION: 1. No acute findings. 2. Osteopenia. 3. Degenerative changes. Electronically Signed   By: Kerby Moors M.D.   On: 07/09/2021 07:12   DG Chest 1 View  Result Date: 07/09/2021 CLINICAL DATA:  Status post fall. Bruising to the first and second metacarpals. Altered mental status. EXAM: CHEST  1 VIEW COMPARISON:   Two/8/18 FINDINGS: Stable cardiomediastinal contours. Right chest wall pacer device noted with leads in the right atrial appendage, right ventricle and coronary  sinus. No signs of pleural effusion or edema. No airspace consolidation. IMPRESSION: No acute cardiopulmonary abnormalities. Electronically Signed   By: Kerby Moors M.D.   On: 07/09/2021 07:10   CT HEAD WO CONTRAST (5MM)  Result Date: 07/09/2021 CLINICAL DATA:  Increased confusion.  Found down EXAM: CT HEAD WITHOUT CONTRAST CT CERVICAL SPINE WITHOUT CONTRAST TECHNIQUE: Multidetector CT imaging of the head and cervical spine was performed following the standard protocol without intravenous contrast. Multiplanar CT image reconstructions of the cervical spine were also generated. RADIATION DOSE REDUCTION: This exam was performed according to the departmental dose-optimization program which includes automated exposure control, adjustment of the mA and/or kV according to patient size and/or use of iterative reconstruction technique. COMPARISON:  01/21/2015 FINDINGS: CT HEAD FINDINGS Brain: High-density subdural hematoma above the left tentorium measuring up to 6 mm in thickness. No significant mass effect. Generalized brain atrophy. Chronic small vessel ischemia in the cerebral white matter, moderate for age. No evidence of acute gray matter infarct, mass, or hydrocephalus. Small remote left cerebellar infarction. Vascular: No hyperdense vessel or unexpected calcification. Skull: Normal. Negative for fracture or focal lesion. Sinuses/Orbits: No evidence of injury.  Bilateral cataract resection Other: Critical Value/emergent results were called by telephone at the time of interpretation on 07/09/2021 at 7:06 am to provider Dr. Gustavus Messing, who was already aware. CT CERVICAL SPINE FINDINGS Alignment: No traumatic malalignment Skull base and vertebrae: No acute fracture. No primary bone lesion or focal pathologic process. C3-4 and C4-5 solid fusion. Soft tissues and  spinal canal: No prevertebral fluid or swelling. No visible canal hematoma. Disc levels:  Generalized disc space narrowing. Upper chest: Biapical pleural based scarring. No posttraumatic finding. IMPRESSION: 1. Acute subdural hematoma along the left tentorium measuring up to 6 mm in thickness. No significant mass effect. 2. Negative for cervical spine fracture. Electronically Signed   By: Jorje Guild M.D.   On: 07/09/2021 07:08   CT CERVICAL SPINE WO CONTRAST  Result Date: 07/09/2021 CLINICAL DATA:  Increased confusion.  Found down EXAM: CT HEAD WITHOUT CONTRAST CT CERVICAL SPINE WITHOUT CONTRAST TECHNIQUE: Multidetector CT imaging of the head and cervical spine was performed following the standard protocol without intravenous contrast. Multiplanar CT image reconstructions of the cervical spine were also generated. RADIATION DOSE REDUCTION: This exam was performed according to the departmental dose-optimization program which includes automated exposure control, adjustment of the mA and/or kV according to patient size and/or use of iterative reconstruction technique. COMPARISON:  01/21/2015 FINDINGS: CT HEAD FINDINGS Brain: High-density subdural hematoma above the left tentorium measuring up to 6 mm in thickness. No significant mass effect. Generalized brain atrophy. Chronic small vessel ischemia in the cerebral white matter, moderate for age. No evidence of acute gray matter infarct, mass, or hydrocephalus. Small remote left cerebellar infarction. Vascular: No hyperdense vessel or unexpected calcification. Skull: Normal. Negative for fracture or focal lesion. Sinuses/Orbits: No evidence of injury.  Bilateral cataract resection Other: Critical Value/emergent results were called by telephone at the time of interpretation on 07/09/2021 at 7:06 am to provider Dr. Gustavus Messing, who was already aware. CT CERVICAL SPINE FINDINGS Alignment: No traumatic malalignment Skull base and vertebrae: No acute fracture. No primary  bone lesion or focal pathologic process. C3-4 and C4-5 solid fusion. Soft tissues and spinal canal: No prevertebral fluid or swelling. No visible canal hematoma. Disc levels:  Generalized disc space narrowing. Upper chest: Biapical pleural based scarring. No posttraumatic finding. IMPRESSION: 1. Acute subdural hematoma along the left tentorium measuring up to 6 mm  in thickness. No significant mass effect. 2. Negative for cervical spine fracture. Electronically Signed   By: Jorje Guild M.D.   On: 07/09/2021 07:08     Time coordinating discharge: Over 30 minutes    Dwyane Dee, MD  [facility] 07/11/2021, 2:43 PM

## 2021-07-11 NOTE — Progress Notes (Signed)
Manufacturing engineer [facility]) Hospital Liaison Note  Bed offered and accepted at [facility] for transfer today. Unit RN please call report to [phone] prior to patient leaving the unit. Please send signed DNR and paperwork with patient.   Please call with any questions or concerns. Thank you  Roselee Nova, [place] [facility] Liaison [phone]

## 2021-07-11 NOTE — Plan of Care (Signed)
Pt is alert x 2. Pt was restless, agitated, and trying to get out of bed. Pt stated she wanted to put her pajamas on and go get in her bed. Pt educated multiple times and attempted to reorient pt. Pt noted laying side ways in bed, legs hanging off bed. Pt noted trying to get out of bed multiple times. Fall mats in place, bed alarm on, fall precautions in place. On call provider paged to inform with concerns for pt safety. PRN ativan order received. Pt is awake but resting, decreased agitation and restlessness.     Problem: Education: Goal: Knowledge of General Education information will improve Description: Including pain rating scale, medication(s)/side effects and non-pharmacologic comfort measures Outcome: Progressing   Problem: Health Behavior/Discharge Planning: Goal: Ability to manage health-related needs will improve Outcome: Progressing   Problem: Clinical Measurements: Goal: Ability to maintain clinical measurements within normal limits will improve Outcome: Progressing Goal: Will remain free from infection Outcome: Progressing Goal: Diagnostic test results will improve Outcome: Progressing Goal: Respiratory complications will improve Outcome: Progressing Goal: Cardiovascular complication will be avoided Outcome: Progressing   Problem: Activity: Goal: Risk for activity intolerance will decrease Outcome: Progressing   Problem: Nutrition: Goal: Adequate nutrition will be maintained Outcome: Progressing   Problem: Coping: Goal: Level of anxiety will decrease Outcome: Progressing   Problem: Coping: Goal: Level of anxiety will decrease Outcome: Progressing   Problem: Elimination: Goal: Will not experience complications related to bowel motility Outcome: Progressing Goal: Will not experience complications related to urinary retention Outcome: Progressing   Problem: Pain Managment: Goal: General experience of comfort will improve Outcome: Progressing   Problem:  Safety: Goal: Ability to remain free from injury will improve Outcome: Progressing   Problem: Skin Integrity: Goal: Risk for impaired skin integrity will decrease Outcome: Progressing

## 2021-07-11 NOTE — TOC Transition Note (Signed)
Transition of Care [facility]) - CM/SW Discharge Note   Patient Details  Name: Debra Lynn MRN: [id number] Date of Birth: [birth year]  [facility] CM/SW Contact:  Bary Castilla, LCSW Phone Number: 07/11/2021, 3:09 PM   Clinical Narrative:     Patient will DC to:?[facility] Anticipated DC date:?07/11/2021 Transport by: Corey Harold   Per MD patient ready for DC to [facility]. RN, patient, patient's family, and facility notified of DC. Discharge Summary sent to facility. RN given number for report   Unit RN please call report to [phone] prior to patient leaving the unit.. DC packet on chart. Ambulance transport requested for patient.   CSW signing off.   [place], [place] [phone]         Patient Goals and CMS Choice        Discharge Placement                       Discharge Plan and Services                                     Social Determinants of Health (SDOH) Interventions     Readmission Risk Interventions     No data to display

## 2021-07-11 NOTE — Progress Notes (Addendum)
AVS, social worker's paperwork, and signed gold DNR form placed in discharge packet. Report called and given to Helene Kelp, Therapist, sports at [facility]. PIVs left in per RN request. All questions answered to satisfaction. [facility] to transport pt with all belongings.

## 2021-08-01 DEATH — deceased
